# Patient Record
Sex: Male | Born: 1960
Health system: Southern US, Community
[De-identification: ages and names within clinical notes are randomized; demographics above are authoritative.]

## PROBLEM LIST (undated history)

## (undated) DIAGNOSIS — R233 Spontaneous ecchymoses: Secondary | ICD-10-CM

## (undated) DIAGNOSIS — F419 Anxiety disorder, unspecified: Secondary | ICD-10-CM

## (undated) DIAGNOSIS — Z460 Encounter for fitting and adjustment of spectacles and contact lenses: Secondary | ICD-10-CM

## (undated) DIAGNOSIS — R0989 Other specified symptoms and signs involving the circulatory and respiratory systems: Secondary | ICD-10-CM

## (undated) DIAGNOSIS — I10 Essential (primary) hypertension: Secondary | ICD-10-CM

## (undated) DIAGNOSIS — M1990 Unspecified osteoarthritis, unspecified site: Secondary | ICD-10-CM

## (undated) DIAGNOSIS — R238 Other skin changes: Secondary | ICD-10-CM

## (undated) DIAGNOSIS — Z9989 Dependence on other enabling machines and devices: Secondary | ICD-10-CM

## (undated) DIAGNOSIS — E669 Obesity, unspecified: Secondary | ICD-10-CM

## (undated) DIAGNOSIS — K8020 Calculus of gallbladder without cholecystitis without obstruction: Secondary | ICD-10-CM

## (undated) DIAGNOSIS — Z923 Personal history of irradiation: Secondary | ICD-10-CM

## (undated) DIAGNOSIS — N200 Calculus of kidney: Secondary | ICD-10-CM

## (undated) DIAGNOSIS — R5383 Other fatigue: Secondary | ICD-10-CM

## (undated) DIAGNOSIS — F32A Depression, unspecified: Secondary | ICD-10-CM

## (undated) DIAGNOSIS — F329 Major depressive disorder, single episode, unspecified: Secondary | ICD-10-CM

## (undated) DIAGNOSIS — R519 Headache, unspecified: Secondary | ICD-10-CM

## (undated) DIAGNOSIS — E785 Hyperlipidemia, unspecified: Secondary | ICD-10-CM

## (undated) DIAGNOSIS — F319 Bipolar disorder, unspecified: Secondary | ICD-10-CM

## (undated) DIAGNOSIS — G4733 Obstructive sleep apnea (adult) (pediatric): Secondary | ICD-10-CM

## (undated) DIAGNOSIS — C61 Malignant neoplasm of prostate: Secondary | ICD-10-CM

## (undated) DIAGNOSIS — R51 Headache: Secondary | ICD-10-CM

## (undated) HISTORY — DX: Essential (primary) hypertension: I10

## (undated) HISTORY — DX: Dependence on other enabling machines and devices: Z99.89

## (undated) HISTORY — DX: Headache, unspecified: R51.9

## (undated) HISTORY — DX: Anxiety disorder, unspecified: F41.9

## (undated) HISTORY — DX: Spontaneous ecchymoses: R23.3

## (undated) HISTORY — DX: Depression, unspecified: F32.A

## (undated) HISTORY — DX: Other fatigue: R53.83

## (undated) HISTORY — DX: Headache: R51

## (undated) HISTORY — DX: Obstructive sleep apnea (adult) (pediatric): G47.33

## (undated) HISTORY — DX: Other specified symptoms and signs involving the circulatory and respiratory systems: R09.89

## (undated) HISTORY — DX: Encounter for fitting and adjustment of spectacles and contact lenses: Z46.0

## (undated) HISTORY — DX: Major depressive disorder, single episode, unspecified: F32.9

## (undated) HISTORY — DX: Other skin changes: R23.8

## (undated) HISTORY — DX: Calculus of kidney: N20.0

---

## 2007-01-03 ENCOUNTER — Inpatient Hospital Stay (HOSPITAL_COMMUNITY): Admission: EM | Admit: 2007-01-03 | Discharge: 2007-01-06 | Payer: Self-pay | Admitting: Emergency Medicine

## 2007-09-21 ENCOUNTER — Encounter: Admission: RE | Admit: 2007-09-21 | Discharge: 2007-09-21 | Payer: Self-pay | Admitting: Orthopedic Surgery

## 2009-04-28 ENCOUNTER — Ambulatory Visit (HOSPITAL_COMMUNITY): Admission: RE | Admit: 2009-04-28 | Discharge: 2009-04-28 | Payer: Self-pay | Admitting: Surgery

## 2009-04-30 ENCOUNTER — Encounter: Admission: RE | Admit: 2009-04-30 | Discharge: 2009-04-30 | Payer: Self-pay | Admitting: Surgery

## 2009-05-05 ENCOUNTER — Encounter (INDEPENDENT_AMBULATORY_CARE_PROVIDER_SITE_OTHER): Payer: Self-pay | Admitting: Surgery

## 2009-05-05 ENCOUNTER — Ambulatory Visit (HOSPITAL_COMMUNITY): Admission: RE | Admit: 2009-05-05 | Discharge: 2009-05-05 | Payer: Self-pay | Admitting: Surgery

## 2009-05-16 ENCOUNTER — Ambulatory Visit: Payer: Self-pay | Admitting: Vascular Surgery

## 2009-08-21 ENCOUNTER — Encounter: Admission: RE | Admit: 2009-08-21 | Discharge: 2009-09-03 | Payer: Self-pay | Admitting: Surgery

## 2009-08-25 ENCOUNTER — Encounter: Admission: RE | Admit: 2009-08-25 | Discharge: 2009-08-25 | Payer: Self-pay | Admitting: Surgery

## 2009-09-09 ENCOUNTER — Ambulatory Visit (HOSPITAL_COMMUNITY): Admission: RE | Admit: 2009-09-09 | Discharge: 2009-09-11 | Payer: Self-pay | Admitting: Surgery

## 2009-09-09 HISTORY — PX: LAPAROSCOPIC GASTRIC BANDING: SHX1100

## 2009-09-25 ENCOUNTER — Encounter: Admission: RE | Admit: 2009-09-25 | Discharge: 2009-12-24 | Payer: Self-pay | Admitting: Surgery

## 2009-12-06 ENCOUNTER — Emergency Department (HOSPITAL_COMMUNITY): Admission: EM | Admit: 2009-12-06 | Discharge: 2009-12-06 | Payer: Self-pay | Admitting: Emergency Medicine

## 2009-12-24 ENCOUNTER — Encounter: Admission: RE | Admit: 2009-12-24 | Discharge: 2009-12-24 | Payer: Self-pay | Admitting: Occupational Medicine

## 2010-09-28 ENCOUNTER — Encounter: Payer: Self-pay | Admitting: Surgery

## 2010-11-22 LAB — GLUCOSE, CAPILLARY
Glucose-Capillary: 107 mg/dL — ABNORMAL HIGH (ref 70–99)
Glucose-Capillary: 111 mg/dL — ABNORMAL HIGH (ref 70–99)
Glucose-Capillary: 112 mg/dL — ABNORMAL HIGH (ref 70–99)
Glucose-Capillary: 131 mg/dL — ABNORMAL HIGH (ref 70–99)
Glucose-Capillary: 146 mg/dL — ABNORMAL HIGH (ref 70–99)
Glucose-Capillary: 189 mg/dL — ABNORMAL HIGH (ref 70–99)
Glucose-Capillary: 75 mg/dL (ref 70–99)
Glucose-Capillary: 92 mg/dL (ref 70–99)
Glucose-Capillary: 94 mg/dL (ref 70–99)
Glucose-Capillary: 94 mg/dL (ref 70–99)
Glucose-Capillary: 98 mg/dL (ref 70–99)

## 2010-11-22 LAB — BLOOD GAS, ARTERIAL
Acid-base deficit: 10.9 mmol/L — ABNORMAL HIGH (ref 0.0–2.0)
O2 Content: 15 L/min
Patient temperature: 98.6
pCO2 arterial: 58.4 mmHg (ref 35.0–45.0)
pO2, Arterial: 70.4 mmHg — ABNORMAL LOW (ref 80.0–100.0)

## 2010-11-22 LAB — BASIC METABOLIC PANEL
BUN: 82 mg/dL — ABNORMAL HIGH (ref 6–23)
BUN: 94 mg/dL — ABNORMAL HIGH (ref 6–23)
CO2: 19 mEq/L (ref 19–32)
CO2: 25 mEq/L (ref 19–32)
Calcium: 10.3 mg/dL (ref 8.4–10.5)
Calcium: 9 mg/dL (ref 8.4–10.5)
Calcium: 9.2 mg/dL (ref 8.4–10.5)
Calcium: 9.8 mg/dL (ref 8.4–10.5)
Chloride: 109 mEq/L (ref 96–112)
Creatinine, Ser: 2.56 mg/dL — ABNORMAL HIGH (ref 0.4–1.5)
Creatinine, Ser: 2.87 mg/dL — ABNORMAL HIGH (ref 0.4–1.5)
Creatinine, Ser: 3.9 mg/dL — ABNORMAL HIGH (ref 0.4–1.5)
Creatinine, Ser: 4.07 mg/dL — ABNORMAL HIGH (ref 0.4–1.5)
Creatinine, Ser: 4.38 mg/dL — ABNORMAL HIGH (ref 0.4–1.5)
GFR calc Af Amer: 19 mL/min — ABNORMAL LOW (ref >60)
GFR calc Af Amer: 28 mL/min — ABNORMAL LOW (ref >60)
GFR calc Af Amer: 32 mL/min — ABNORMAL LOW (ref >60)
GFR calc non Af Amer: 14 mL/min — ABNORMAL LOW (ref >60)
GFR calc non Af Amer: 17 mL/min — ABNORMAL LOW (ref >60)
GFR calc non Af Amer: 18 mL/min — ABNORMAL LOW (ref >60)
GFR calc non Af Amer: 24 mL/min — ABNORMAL LOW (ref >60)
GFR calc non Af Amer: 27 mL/min — ABNORMAL LOW (ref >60)
Glucose, Bld: 101 mg/dL — ABNORMAL HIGH (ref 70–99)
Potassium: 4.7 mEq/L (ref 3.5–5.1)
Potassium: 6.2 mEq/L — ABNORMAL HIGH (ref 3.5–5.1)
Sodium: 135 mEq/L (ref 135–145)
Sodium: 140 mEq/L (ref 135–145)
Sodium: 142 mEq/L (ref 135–145)

## 2010-11-22 LAB — URINALYSIS, ROUTINE W REFLEX MICROSCOPIC
Glucose, UA: NEGATIVE mg/dL
Ketones, ur: NEGATIVE mg/dL
Protein, ur: NEGATIVE mg/dL
pH: 5 (ref 5.0–8.0)

## 2010-11-22 LAB — POCT I-STAT EG7: HCT: 29 % — ABNORMAL LOW (ref 39.0–52.0)

## 2010-11-22 LAB — DIFFERENTIAL
Basophils Absolute: 0 10*3/uL (ref 0.0–0.1)
Eosinophils Relative: 1 % (ref 0–5)
Lymphocytes Relative: 8 % — ABNORMAL LOW (ref 12–46)
Monocytes Absolute: 0.7 10*3/uL (ref 0.1–1.0)
Monocytes Relative: 6 % (ref 3–12)

## 2010-11-22 LAB — CBC
HCT: 29.5 % — ABNORMAL LOW (ref 39.0–52.0)
Hemoglobin: 10.4 g/dL — ABNORMAL LOW (ref 13.0–17.0)
Hemoglobin: 9.9 g/dL — ABNORMAL LOW (ref 13.0–17.0)
RBC: 3.48 MIL/uL — ABNORMAL LOW (ref 4.22–5.81)
RDW: 13.9 % (ref 11.5–15.5)

## 2010-11-22 LAB — LITHIUM LEVEL: Lithium Lvl: 2 mEq/L — ABNORMAL HIGH (ref 0.80–1.40)

## 2010-11-22 LAB — URINE MICROSCOPIC-ADD ON

## 2010-11-22 LAB — POCT I-STAT, CHEM 8
Chloride: 121 mEq/L — ABNORMAL HIGH (ref 96–112)
Creatinine, Ser: 4.1 mg/dL — ABNORMAL HIGH (ref 0.4–1.5)
Sodium: 131 mEq/L — ABNORMAL LOW (ref 135–145)

## 2010-11-22 LAB — HEMOGLOBIN A1C: Mean Plasma Glucose: 126 mg/dL

## 2010-11-22 LAB — CREATININE, URINE, RANDOM: Creatinine, Urine: 69.7 mg/dL

## 2010-11-22 LAB — UREA NITROGEN, URINE: Urea Nitrogen, Ur: 585 mg/dL

## 2010-12-07 LAB — DIFFERENTIAL
Lymphs Abs: 1.3 10*3/uL (ref 0.7–4.0)
Monocytes Relative: 6 % (ref 3–12)
Neutro Abs: 12.2 10*3/uL — ABNORMAL HIGH (ref 1.7–7.7)
Neutrophils Relative %: 83 % — ABNORMAL HIGH (ref 43–77)

## 2010-12-07 LAB — CBC
HCT: 31.8 % — ABNORMAL LOW (ref 39.0–52.0)
Hemoglobin: 10.5 g/dL — ABNORMAL LOW (ref 13.0–17.0)
MCHC: 33 g/dL (ref 30.0–36.0)
MCV: 90.8 fL (ref 78.0–100.0)
RDW: 14 % (ref 11.5–15.5)

## 2010-12-07 LAB — COMPREHENSIVE METABOLIC PANEL
BUN: 62 mg/dL — ABNORMAL HIGH (ref 6–23)
CO2: 21 mEq/L (ref 19–32)
Calcium: 9.7 mg/dL (ref 8.4–10.5)
Creatinine, Ser: 3.93 mg/dL — ABNORMAL HIGH (ref 0.4–1.5)
GFR calc non Af Amer: 16 mL/min — ABNORMAL LOW (ref >60)
Glucose, Bld: 103 mg/dL — ABNORMAL HIGH (ref 70–99)
Total Protein: 7.9 g/dL (ref 6.0–8.3)

## 2011-01-22 NOTE — Consult Note (Signed)
NAMEOBIE, KALLENBACH NO.:  [id number]   MEDICAL RECORD NO.:  [id number]          PATIENT TYPE:  INP   LOCATION:  5007                         FACILITY:  [facility]   PHYSICIAN:  Antonietta Breach, M.D.  DATE OF BIRTH:  June 13, 1961   DATE OF CONSULTATION:  01/04/2007  DATE OF DISCHARGE:  01/06/2007                                 CONSULTATION   REQUESTING PHYSICIAN:  Kela Millin, M.D.   REASON FOR CONSULTATION:  Confusion, psychosis, with lithium toxicity.  Evaluate and recommend psychotropic medication management.   HISTORY OF PRESENT ILLNESS:  Frank Cox is a 50 year old male  admitted to the [facility] on January 03, 2007, due to acute confusion,  persistent nausea and vomiting.  The patient has about a week of  progressive nausea, vomiting, staggering, as well as confusion and  thought disorganization.   When the patient's lithium level was assessed in the emergency room, it  was at 3.02.   Frank Cox has recovered his orientation.  He now has intact judgment and  thought process.  He is cooperative with health care.  His mood is  within normal limits.  He has intact interests and future goals.   PAST PSYCHIATRIC HISTORY:  Frank Cox has a history of mania which  developed in his 52s.  He has experienced an extended several-day period  of racing thoughts, pressured speech, increased goal-directed activity,  euphoria, decreased need for sleep and heightened energy.  He also  experienced visual hallucinations during his manic period.   He has been maintained on lithium for 20 years.   Concerning depression, the patient has had severe depression in the past  which resulted in a suicide attempt in 2000 with an overdose of  medication.  He had a psychiatric admission at that time, where Lamictal  was added to lithium in order to stabilize him.   He does have a history of trying other mood stabilizers including  Depakote and Tegretol, which have  failed.   FAMILY PSYCHIATRIC HISTORY:  None known.   SOCIAL HISTORY:  No alcohol.  No illegal drugs.  He does not smoke.  He  works for the [employer].   PAST MEDICAL HISTORY:  Frank Cox does have hypertension and has been  treated with hydrochlorothiazide.  He also has a history of seizure  disorder.   MEDICATIONS:  The MAR is reviewed.  He is on Lamictal 200 mg daily,  Ativan 1 mg q.h.s., Ativan 1 mg b.i.d. p.r.n.   No known drug allergies.   LABORATORY DATA:  WBC 18.3, hemoglobin 11.8, platelet count 375.  BUN  13, creatinine 1.05, calcium 9.9.  Lithium was 2.37 on April 29, now  decreased to 1.61 today on April 30.   REVIEW OF SYSTEMS:  CONSTITUTIONAL:  Afebrile.  No weight loss.  HEAD:  No trauma.  EYES:  No visual changes.  EARS:  No hearing impairment.  NOSE:  No rhinorrhea.  MOUTH/THROAT:  No sore throat.  NEUROLOGIC:  As  above.  PSYCHIATRIC:  The patient's medications on admission included  Celexa 40 mg  daily, Klonopin 1 mg daily, Lamictal 200 mg daily, and  lithium 450 mg daily.  CARDIOVASCULAR:  No chest pain or palpitations.  RESPIRATORY:  No coughing or wheezing.  GASTROINTESTINAL:  No nausea,  vomiting, diarrhea.  GENITOURINARY:  No dysuria.  SKIN:  Unremarkable.  MUSCULOSKELETAL:  No deformities.  ENDOCRINE/METABOLIC:  Unremarkable.  HEMATOLOGIC/LYMPHATIC:  Mild anemia.   EXAMINATION:  VITAL SIGNS:  Temperature 99.8, pulse 82, respiration 22,  blood pressure 161/78, O2 saturation on room air 97%.  GENERAL APPEARANCE:  Frank Cox is a middle-aged male reclining in his  hospital bed in a supine position, with no abnormal involuntary  movements.  His body habitus is normal.  He is well-groomed.  OTHER MENTAL STATUS EXAM:  Frank Cox is alert.  His eye contact is good.  His attention span is normal.  His concentration is mildly decreased.  He is oriented completely to all spheres.  His memory is intact to  immediate, recent and remote except for the severe  delirium periods  during his high lithium toxicity.   Speech involves normal rate and prosody without dysarthria.  Fund of  knowledge and intelligence are within normal limits.  Thought process  logical, coherent, goal-directed.  No looseness of associations.  Calculation and abstraction ability are intact.  Thought content:  No  thoughts of harming himself.  No thoughts of harming others.  No  delusions.  No hallucinations.  Insight is partial.  Judgment is intact.  Affect is mildly anxious at baseline but with a broad, appropriate  response.  Mood is within normal limits.   ASSESSMENT:  AXIS I:  293.00 delirium due to lithium toxicity, now  improved.  296.80, bipolar disorder not otherwise specified, stable.  AXIS II:  None.  AXIS III:  See general medical problems.  AXIS IV:  General medical, primary support group.  AXIS V:  55.   Frank Cox is not at risk to harm himself or others.  He does agree to  call emergency services immediately for any thoughts of harming himself,  thoughts of harming others, or other psychiatric emergency symptoms.   The undersigned provided education and ego-supportive psychotherapy.   The indications, alternatives and adverse effects of the following were  discussed with the patient:  Lithium, including the risk of lethal  toxicity along with the risk of renal damage and thyroid damage;  Lamictal to provide mood stabilization in addition to lithium due to the  patient not responding to single mood stabilizer therapy, including the  risk of a lethal rash from Lamictal; Celexa for antidepression including  the risk of mania; Klonopin for antianxiety but also augmenting lithium  and Lamictal in mood stabilization.  The patient understands the above and wants to proceed with the plan  below.   1. Would continue the patient's preadmission psychotropic medications      and dosages including his lithium 450 mg daily, Lamictal 200 mg      daily, Celexa 40  mg daily, as well as Klonopin 1 mg daily.      Concerning his outpatient follow-up, would have him follow up with      his outpatient psychiatrist within the first week of discharge.  2. Regarding the patient's polypharmacy above, it must be understood      that he has had a history of refractory mood states on more simple      regimen and has attained this regimen over several medication      trials and has developed  stability with this regimen.  3. Given the risks of managing lithium with hydrochlorothiazide, would      try to avoid hydrochlorothiazide and use an alternative      antihypertensive treatment.  4. Would restart the patient's lithium if the hydrochlorothiazide is      not used.  Would start the lithium at 450 mg q.h.s., which is a      conservative dose.  Would then take a level of lithium at 12 hours      post last dose (trough level) on day #6.  Would adjust his lithium      accordingly.   DISCUSSION:  With the patient's change in diet and/or change in HCTZ  dosing, the lithium blood level increased resulting in lithium toxicity.      Antonietta Breach, M.D.  Electronically Signed     JW/MEDQ  D:  03/16/2007  T:  03/17/2007  Job:  [id number]

## 2011-01-22 NOTE — Discharge Summary (Signed)
Frank Cox, Frank Cox                ACCOUNT NO.:  [id number]   MEDICAL RECORD NO.:  [id number]          PATIENT TYPE:  INP   LOCATION:  5007                         FACILITY:  [facility]   PHYSICIAN:  Michelene Gardener, MD    DATE OF BIRTH:  Mar 19, 1961   DATE OF ADMISSION:  01/03/2007  DATE OF DISCHARGE:  01/06/2007                               DISCHARGE SUMMARY   PRIMARY CARE PHYSICIAN:  Dr. Tally Joe.   DISCHARGE DIAGNOSES:  1. Lithium toxicity.  2. Leukocytosis secondary to urinary tract infection.  3. Dehydration.  4. Hypertension.  5. Bipolar disorder.  6. History of seizure disorder.  7. Morbid obesity.   DISCHARGE MEDICATIONS:  1. Ibuprofen as needed.  2. Diclofenac 75 mg p.o. twice daily as needed.  3. Lisinopril 20 mg p.o. once daily.  4. Metoprolol 25 mg p.o. twice daily.  5. Lithium 450 mg p.o. at bedtime.  6. Lamictal 200 mg p.o. at night.  7. Clonazepam 1 mg p.o. three times daily.  8. Citalopram 40 mg p.o. once daily.  9. Benadryl as needed.   CONSULTATIONS:  Psychiatry consult.   PROCEDURES:  None.   FOLLOW-UP APPOINTMENTS:  1. With Dr. Tally Joe in one week.  2. Private psychiatrist, Dr. Ander Slade Pret, in six days.   CONDITION AT TIME OF DISCHARGE:  The patient is stable.   MEDICATIONS THAT WERE DISCONTINUED:  Lisinopril/hydrochlorothiazide that  was switched to lisinopril.   COURSE OF HOSPITALIZATION:  This 51 year old morbidly obese gentleman  was admitted on April29,2008 with lithium toxicity.  At the time  of presentation his lithium was 3.3.  This patient has been having  bipolar disorder, and he was taking lithium for more than 20 years.  He  has been doing fine on lithium.  Presented with nausea and vomiting  which was secondary to lithium.  The patient was admitted initially to  the step-down unit.  His lithium was discontinued, and he was hydrated  aggressively.  Repeat of his lithium showed improvement done on the time  of discharge.  It came  down to 1.1.  During hospitalization he was seen  by Dr. Jeanie Sewer, psychiatrist, who recommended to restart him back on  his lithium at a lower dose at 450 mg a day, and then he should check  his lithium level in six days with his private psychiatrist, and his  dose can be adjusted accordingly.  The patient's urine was also showing  possible urinary tract infection.  On the time of presentation the  patient had urine culture which came to be normal.  The patient was on  lisinopril/hydrochlorothiazide, and that was discontinued as recommended  by psychiatrist since the hydrochlorothiazide will increase the lithium  level.  The patient was kept on lisinopril in addition to metoprolol to  control his blood pressure.  He was again advised to see his primary  physician for further adjustment of his antihypertensive medications.   DISPOSITION:  Otherwise, other medical problems remained stable, and the  patient was continued on same medications taken at home.   Total assessment time is 40 minutes.  Michelene Gardener, MD  Electronically Signed     NAE/MEDQ  D:  01/06/2007  T:  01/06/2007  Job:  [id number]   cc:   Tally Joe, M.D.  Enis Slipper, M.D.

## 2011-01-22 NOTE — H&P (Signed)
NAMEYISRAEL, OBRYAN                ACCOUNT NO.:  [id number]   MEDICAL RECORD NO.:  [id number]          PATIENT TYPE:  INP   LOCATION:  3305                         FACILITY:  [facility]   PHYSICIAN:  Kela Millin, M.D.DATE OF BIRTH:  07-Jul-1961   DATE OF ADMISSION:  01/03/2007  DATE OF DISCHARGE:                              HISTORY & PHYSICAL   PRIMARY CARE PHYSICIAN:  Tally Joe, M.D.   CHIEF COMPLAINT:  Persistent nausea and vomiting.   HISTORY OF PRESENT ILLNESS:  The patient is a 50 year old, morbidly  obese, white male with past medical history significant for bipolar  disorder on Lithium x20 years, hypertension, seizure disorder with last  seizure in 2001, who presents with complaints of nausea and vomiting x1  week.  His parents are present at the bedside assisting with the  history.  The patient states that he was scheduled for a gastric lap  banding procedure at [facility] per Dr. Lily Peer today and in preparation  for that surgery, he has been on a liquid/protein diet regimen for a  couple of weeks.  For the past 1 week now, he has had nausea, vomiting  and just generalized malaise, but he states that he thought that he was  going to feel bad anyway on the diet, so he did not get too concerned  about it.  His parents state that they just came from out of town to see  the patient and help him out around the time of his surgery and they  have noticed that he has been confused sometimes and really unlike  himself, disorganized and does not seem to be able to think clearly.  They also state that they have noted some intermittent tremors and also  that he seems to stagger when walking.  The patient does not really know  whether or not he took his medications last p.m. or when exactly he took  his medications.  He states that his psychiatrist is Dr. Bobby Rumpf, but he  does not recall when his last Lithium level was done.  He denies fevers,  chest pain, cough, dysuria and no  seizures were reported.  As above, he  states his last seizure was in 2001.   The patient was seen in the emergency room and a Lithium level was done  and this was found to be elevated at 3.02.  His urinalysis showed trace  leukocyte esterase with 3-6 wbc's and rare bacteria.  He is admitted to  the [facility] service for further evaluation and management.   PAST MEDICAL HISTORY:  As above.   MEDICATIONS:  1. Ibuprofen.  2. Diclofenac 75 mg b.i.d.  3. Lamictal 200 mg  p.o. nightly.  4. Lisinopril/hydrochlorothiazide 20/25 mg daily.  5. Lithium 450 mg four tablets nightly.  6. Metoprolol 25 mg daily.  7. Clonazepam 1 mg t.i.d.  8. Citalopram 40 mg daily.  9. Benadryl p.r.n.   ALLERGIES:  NO KNOWN DRUG ALLERGIES.   SOCIAL HISTORY:  He denies tobacco and alcohol.   FAMILY HISTORY:  Noncontributory.   REVIEW OF SYSTEMS:  As per HPI.  PHYSICAL EXAMINATION:  GENERAL:  The patient is a morbidly obese, middle-  aged, white male.  He is alert and oriented x3 in no apparent distress.  VITAL SIGNS:  Temperature 97.8 with a blood pressure of 117/59, pulse  68, respirations 20, O2 saturations 95%.  HEENT:  PERRL, EOMI, sclerae anicteric.  Dry mucous membranes and no  oral exudates.  NECK:  Supple, no adenopathy and no thyromegaly.  LUNGS:  Decreased breath sounds at the bases, otherwise clear to  auscultation bilaterally, no wheezes.  CARDIOVASCULAR:  Regular rate and rhythm, normal S1, S2, no S3  appreciated.  ABDOMEN:  Obese, bowel sounds present, nontender, nondistended.  No  organomegaly and no masses appreciated.  EXTREMITIES:  Brownish discoloration in his lower legs.  No edema or  cyanosis.  NEUROLOGIC:  He is alert and oriented x3.  Her appears to have  difficulty recalling/following through with his train of thought.  Sensory is grossly intact.  Strength is 5/5 and symmetric.  Reflexes +2  and symmetric.  No clonus.   LABORATORY DATA AND X-RAY FINDINGS:  Sodium  136 with a potassium of 4.3,  chloride 110, BUN 35, glucose 147.  His pH is 7.46 with pCO2 of 28.9,  creatinine 1.3.  Lithium level is 3.02.  Hemoglobin 12.9 with a  hematocrit of 38.  Urinalysis is clear with specific gravity of 1.018,  trace leukocyte esterase, 3-6 wbc's, rare bacteria.   ASSESSMENT/PLAN:  1. Lithium intoxication as discussed above, more likely secondary to      chronic accumulation.  The patient with multiple factors that      likely to impair Lithium excretion (nonsteroidal anti-inflammatory      drugs use with ibuprofen and Naproxen), diuretics      (hydrochlorothiazide), ACE inhibitor, volume depletion and probable      urinary tract infection.  Will discontinue Lithium for now.      Supportive care with adequate hydration and intravenous fluids with      goal urine output of 1-2 mL/kg per hour.  Will obtain an EKG and      monitor urine output and mental status in the step-down unit.  Will      discontinue ACE inhibitor and nonsteroidal anti-inflammatory drugs      for now.  The patient was discussed with poison control and they      agree with above and also recommend a recheck Lithium level in 4      hours, no indications for dialysis at this time.  Will consult      psychiatry for assistance with medication recommendations.  2. Pyuria, probable urinary tract infection.  Will obtain urine      cultures, empiric antibiotics and follow.  3. Volume depletion/azotemia.  Hydrate as above and follow.  4. Hypertension.  Monitor blood pressures and treat as appropriate.  5. Bipolar disorder.  As above, psychiatric consult to assist with      medications.  6. History of seizure disorder.  Continue Lamictal as above.  Last      seizure was in 2001.  Monitor in step-down.  7. Morbid obesity.  Lap banding procedure per Dr. Lily Peer, canceled      for now.  Follow up with primary care physician upon discharge.     Kela Millin, M.D.  Electronically Signed     ACV/MEDQ  D:  01/04/2007  T:  01/04/2007  Job:  [id number]   cc:   Tally Joe, M.D.  Dr. [facility]

## 2011-03-05 ENCOUNTER — Ambulatory Visit (INDEPENDENT_AMBULATORY_CARE_PROVIDER_SITE_OTHER): Payer: Self-pay | Admitting: Physician Assistant

## 2011-03-05 ENCOUNTER — Encounter (INDEPENDENT_AMBULATORY_CARE_PROVIDER_SITE_OTHER): Payer: Self-pay | Admitting: Physician Assistant

## 2011-03-05 VITALS — BP 122/78 | Ht 70.0 in | Wt 320.0 lb

## 2011-03-05 DIAGNOSIS — Z4651 Encounter for fitting and adjustment of gastric lap band: Secondary | ICD-10-CM

## 2011-03-05 NOTE — Patient Instructions (Signed)
Take clear liquids for the next 48 hours. Thin protein shakes are ok to start on Saturday evening. Call us if you have persistent vomiting or regurgitation, night cough or reflux symptoms. Return as scheduled or sooner if you notice no changes in hunger/portion sizes.   

## 2011-03-05 NOTE — Progress Notes (Signed)
Subjective:     Patient ID: Frank Cox, male   DOB: 1960/09/29, 50 y.o.   MRN: [id number]    BP 122/78  Ht 5\' 10"  (1.778 m)  Wt 320 lb (145.151 kg)  BMI 45.92 kg/m2    HPI See scanned dictated note.  Review of Systems     Objective:   Physical Exam     Assessment:         Plan:

## 2011-04-09 ENCOUNTER — Ambulatory Visit (INDEPENDENT_AMBULATORY_CARE_PROVIDER_SITE_OTHER): Payer: 59 | Admitting: Physician Assistant

## 2011-04-09 ENCOUNTER — Encounter (INDEPENDENT_AMBULATORY_CARE_PROVIDER_SITE_OTHER): Payer: Self-pay

## 2011-04-09 NOTE — Patient Instructions (Signed)
Return in 1 month or sooner if necessary.

## 2011-04-09 NOTE — Progress Notes (Signed)
  HISTORY: Frank Cox is a 50 y.o.male who received an AP-Large lap-band in January 2011 by Dr. Daphine Deutscher. He has been doing well since his last visit. He denies vomiting or regurgitation symptoms and says his hunger and portion sizes remain under control.  VITAL SIGNS: Filed Vitals:   04/09/11 1625  BP: 122/72    PHYSICAL EXAM: Physical exam reveals a very well-appearing 50 y.o.male in no apparent distress Neurologic: Awake, alert, oriented Psych: Bright affect, conversant Respiratory: Breathing even and unlabored. No stridor or wheezing Extremities: Atraumatic, good range of motion. Skin: Warm, Dry, no rashes Musculoskeletal: Normal gait, Joints normal  ASSESMENT: 50 y.o.  male  s/p AP-Large lap-band.   PLAN: We deferred an adjustment today. We'll have him back in 1 month or sooner if necessary.

## 2011-05-14 ENCOUNTER — Encounter (INDEPENDENT_AMBULATORY_CARE_PROVIDER_SITE_OTHER): Payer: Self-pay

## 2011-05-14 ENCOUNTER — Ambulatory Visit (INDEPENDENT_AMBULATORY_CARE_PROVIDER_SITE_OTHER): Payer: 59 | Admitting: Physician Assistant

## 2011-05-14 NOTE — Patient Instructions (Signed)
Return in 4-6 weeks or sooner if necessary.

## 2011-05-14 NOTE — Progress Notes (Signed)
  HISTORY: Frank Cox is a 50 y.o.male who received an AP-Large lap-band in January 2011 by Dr. Daphine Deutscher. He says his hunger and portion sizes are in good control. No vomiting or regurgitation. He's lost four pounds since his last visit.Marland Kitchen  VITAL SIGNS: Filed Vitals:   05/14/11 1550  BP: 128/86  Pulse: 60    PHYSICAL EXAM: Physical exam reveals a very well-appearing 50 y.o.male in no apparent distress Neurologic: Awake, alert, oriented Psych: Bright affect, conversant Respiratory: Breathing even and unlabored. No stridor or wheezing Extremities: Atraumatic, good range of motion. Skin: Warm, Dry, no rashes Musculoskeletal: Normal gait, Joints normal  ASSESMENT: 50 y.o.  male  s/p AP-Large lap-band.   PLAN: We deferred an adjustment today on account of his being in the green zone. We'll have him back in 4-6 weeks.

## 2011-06-25 ENCOUNTER — Encounter (INDEPENDENT_AMBULATORY_CARE_PROVIDER_SITE_OTHER): Payer: 59

## 2011-07-21 ENCOUNTER — Encounter (INDEPENDENT_AMBULATORY_CARE_PROVIDER_SITE_OTHER): Payer: Self-pay | Admitting: Surgery

## 2011-07-23 ENCOUNTER — Encounter (INDEPENDENT_AMBULATORY_CARE_PROVIDER_SITE_OTHER): Payer: 59

## 2011-11-24 ENCOUNTER — Encounter (INDEPENDENT_AMBULATORY_CARE_PROVIDER_SITE_OTHER): Payer: Self-pay | Admitting: Surgery

## 2011-11-25 ENCOUNTER — Encounter (INDEPENDENT_AMBULATORY_CARE_PROVIDER_SITE_OTHER): Payer: Self-pay

## 2011-11-25 ENCOUNTER — Ambulatory Visit (INDEPENDENT_AMBULATORY_CARE_PROVIDER_SITE_OTHER): Payer: 59 | Admitting: Physician Assistant

## 2011-11-25 VITALS — BP 132/84 | HR 72 | Temp 97.8°F | Resp 18 | Ht 70.0 in | Wt 330.0 lb

## 2011-11-25 DIAGNOSIS — Z4651 Encounter for fitting and adjustment of gastric lap band: Secondary | ICD-10-CM

## 2011-11-25 NOTE — Patient Instructions (Signed)
Take clear liquids tonight. Thin protein shakes are ok to start tomorrow morning. Slowly advance your diet thereafter. Call us if you have persistent vomiting or regurgitation, night cough or reflux symptoms. Return as scheduled or sooner if you notice no changes in hunger/portion sizes.  

## 2011-11-25 NOTE — Progress Notes (Signed)
  HISTORY: Frank Cox is a 51 y.o.male who received an AP-Large lap-band in January 2011 by Dr. Daphine Deutscher. He comes in with increased hunger and portion sizes. He also is having difficulty with physical activity as his job has become sedentary and he's working third shift. He reports no untwoard symptoms.  VITAL SIGNS: Filed Vitals:   11/25/11 1156  BP: 132/84  Pulse: 72  Temp: 97.8 F (36.6 C)  Resp: 18    PHYSICAL EXAM: Physical exam reveals a very well-appearing 51 y.o.male in no apparent distress Neurologic: Awake, alert, oriented Psych: Bright affect, conversant Respiratory: Breathing even and unlabored. No stridor or wheezing Abdomen: Soft, nontender, nondistended to palpation. Incisions well-healed. No incisional hernias. Port easily palpated. Extremities: Atraumatic, good range of motion.  ASSESMENT: 51 y.o.  male  s/p AP-large lap-band.   PLAN: The patient's port was accessed with a 20G Huber needle without difficulty. Clear fluid was aspirated and 0.5 mL saline was added to the port. The patient was able to swallow water without difficulty following the procedure and was instructed to take clear liquids for the next 24-48 hours and advance slowly as tolerated.

## 2011-12-02 ENCOUNTER — Ambulatory Visit (INDEPENDENT_AMBULATORY_CARE_PROVIDER_SITE_OTHER): Payer: 59 | Admitting: Surgery

## 2011-12-02 ENCOUNTER — Encounter (INDEPENDENT_AMBULATORY_CARE_PROVIDER_SITE_OTHER): Payer: Self-pay | Admitting: Surgery

## 2011-12-02 VITALS — BP 112/78 | HR 76 | Temp 98.0°F | Ht 70.0 in | Wt 323.2 lb

## 2011-12-02 DIAGNOSIS — Z9884 Bariatric surgery status: Secondary | ICD-10-CM | POA: Insufficient documentation

## 2011-12-02 NOTE — Progress Notes (Signed)
Frank Cox Body mass index is 46.37 kg/(m^2).  Having regurgitation:  Yes. Cannot keep anything down  Nocturnal reflux?  yes  Amount of fill  -0.7 cc  Return 6 weeks

## 2012-01-07 ENCOUNTER — Encounter (INDEPENDENT_AMBULATORY_CARE_PROVIDER_SITE_OTHER): Payer: 59 | Admitting: Surgery

## 2012-02-08 ENCOUNTER — Encounter (INDEPENDENT_AMBULATORY_CARE_PROVIDER_SITE_OTHER): Payer: Self-pay | Admitting: Surgery

## 2012-02-10 ENCOUNTER — Encounter (INDEPENDENT_AMBULATORY_CARE_PROVIDER_SITE_OTHER): Payer: 59

## 2012-03-16 ENCOUNTER — Encounter (INDEPENDENT_AMBULATORY_CARE_PROVIDER_SITE_OTHER): Payer: 59

## 2012-04-27 ENCOUNTER — Ambulatory Visit (INDEPENDENT_AMBULATORY_CARE_PROVIDER_SITE_OTHER): Payer: 59 | Admitting: Physician Assistant

## 2012-04-27 ENCOUNTER — Encounter (INDEPENDENT_AMBULATORY_CARE_PROVIDER_SITE_OTHER): Payer: Self-pay

## 2012-04-27 VITALS — BP 160/80 | HR 45 | Temp 98.3°F | Resp 18 | Ht 71.0 in | Wt 334.0 lb

## 2012-04-27 DIAGNOSIS — Z4651 Encounter for fitting and adjustment of gastric lap band: Secondary | ICD-10-CM

## 2012-04-27 NOTE — Patient Instructions (Signed)
Attend radiology for upper GI. Return in one month or sooner if needed, especially if you have persistent vomiting.

## 2012-04-27 NOTE — Progress Notes (Signed)
  HISTORY: Frank Cox is a 51 y.o.male who received an AP-Large lap-band in January 2011 by Dr. Daphine Deutscher. He was last seen in March where Dr. Daphine Deutscher removed 0.7 mL from the band for persistent regurgitation following a fill. He said at that time he was intolerant of even liquids and had some blood-tinged emesis. He said despite the fluid removal he's had some solid food intolerance and has been reaching for high-carb foods. He's had continued occasional blood-tinged emesis as well. He complains of occasional LUQ fullness and pain with eating.  VITAL SIGNS: Filed Vitals:   04/27/12 0922  BP: 160/80  Pulse: 45  Temp: 98.3 F (36.8 C)  Resp: 18    PHYSICAL EXAM: Physical exam reveals a very well-appearing 51 y.o.male in no apparent distress Neurologic: Awake, alert, oriented Psych: Bright affect, conversant Respiratory: Breathing even and unlabored. No stridor or wheezing Abdomen: Soft, nontender, nondistended to palpation. Incisions well-healed. No incisional hernias. Port easily palpated. Extremities: Atraumatic, good range of motion.  ASSESMENT: 51 y.o.  male  s/p AP-Large lap-band.   PLAN: The patient's port was accessed with a 20G Huber needle without difficulty. Clear fluid was aspirated and 3 mL saline was removed from the band. I've scheduled him for an upper GI with follow-up afterwards. If he has continued blood-tinged emesis, he may be in need of an EGD. This was discussed with him and he agrees.

## 2012-05-09 ENCOUNTER — Ambulatory Visit
Admission: RE | Admit: 2012-05-09 | Discharge: 2012-05-09 | Disposition: A | Discharge status: Home / Self Care | Payer: Self-pay | Source: Ambulatory Visit | Attending: Physician Assistant | Admitting: Physician Assistant

## 2012-05-09 ENCOUNTER — Other Ambulatory Visit (INDEPENDENT_AMBULATORY_CARE_PROVIDER_SITE_OTHER): Payer: Self-pay | Admitting: Physician Assistant

## 2012-05-09 DIAGNOSIS — Z4651 Encounter for fitting and adjustment of gastric lap band: Secondary | ICD-10-CM

## 2012-05-10 ENCOUNTER — Other Ambulatory Visit (INDEPENDENT_AMBULATORY_CARE_PROVIDER_SITE_OTHER): Payer: Self-pay | Admitting: Physician Assistant

## 2012-05-10 ENCOUNTER — Ambulatory Visit
Admission: RE | Admit: 2012-05-10 | Discharge: 2012-05-10 | Disposition: A | Discharge status: Home / Self Care | Payer: 59 | Source: Ambulatory Visit | Attending: Physician Assistant | Admitting: Physician Assistant

## 2012-05-10 ENCOUNTER — Telehealth (INDEPENDENT_AMBULATORY_CARE_PROVIDER_SITE_OTHER): Payer: Self-pay | Admitting: General Surgery

## 2012-05-10 ENCOUNTER — Other Ambulatory Visit: Payer: Self-pay

## 2012-05-10 DIAGNOSIS — Z4651 Encounter for fitting and adjustment of gastric lap band: Secondary | ICD-10-CM

## 2012-05-10 NOTE — Telephone Encounter (Signed)
Spoke with pt and informed him that his UGI  Showed stable gastric band position w/ no obstruction.  I went ahead and made an appt for him to see Dr. Daphine Deutscher to see where to go from here.

## 2012-05-11 ENCOUNTER — Encounter (INDEPENDENT_AMBULATORY_CARE_PROVIDER_SITE_OTHER): Payer: 59

## 2012-05-15 ENCOUNTER — Encounter (INDEPENDENT_AMBULATORY_CARE_PROVIDER_SITE_OTHER): Payer: Self-pay | Admitting: Surgery

## 2012-05-15 ENCOUNTER — Ambulatory Visit (INDEPENDENT_AMBULATORY_CARE_PROVIDER_SITE_OTHER): Payer: 59 | Admitting: Surgery

## 2012-05-15 VITALS — BP 142/80 | HR 68 | Temp 97.7°F | Resp 16 | Ht 70.0 in | Wt 335.6 lb

## 2012-05-15 DIAGNOSIS — Z9884 Bariatric surgery status: Secondary | ICD-10-CM

## 2012-05-15 NOTE — Progress Notes (Signed)
Mr. Treinen was too tight when Mardelle Matte saw him August 22 in order an upper GI series. That showed his band to be in stable position with no obstruction. He had some decreased esophageal motility. He did have a fair amount of stool in his colon.  More importantly Mr. Iannone is doing so much better in terms of his food choices. He had developed maladaptive eating as a result of being too tight. He understands that now it is back eating the right things. I think that leaving more he has and see him back in 2 months.

## 2012-05-15 NOTE — Patient Instructions (Signed)

## 2012-05-25 ENCOUNTER — Encounter (INDEPENDENT_AMBULATORY_CARE_PROVIDER_SITE_OTHER): Payer: 59

## 2012-07-12 ENCOUNTER — Telehealth (INDEPENDENT_AMBULATORY_CARE_PROVIDER_SITE_OTHER): Payer: Self-pay | Admitting: General Surgery

## 2012-07-12 NOTE — Telephone Encounter (Signed)
LMOM letting Frank Cox know that we also have the choice for him to schedule with our [facility] tomorrow 07/13/12.  I asked the Frank Cox to return my call so I could reschedule him if he wishes.

## 2012-07-13 ENCOUNTER — Encounter (INDEPENDENT_AMBULATORY_CARE_PROVIDER_SITE_OTHER): Payer: Self-pay

## 2012-07-13 ENCOUNTER — Encounter (INDEPENDENT_AMBULATORY_CARE_PROVIDER_SITE_OTHER): Payer: 59 | Admitting: Surgery

## 2012-07-13 ENCOUNTER — Ambulatory Visit (INDEPENDENT_AMBULATORY_CARE_PROVIDER_SITE_OTHER): Payer: 59 | Admitting: Physician Assistant

## 2012-07-13 VITALS — BP 132/60 | HR 64 | Temp 97.2°F | Resp 20 | Ht 70.0 in | Wt 344.4 lb

## 2012-07-13 DIAGNOSIS — Z4651 Encounter for fitting and adjustment of gastric lap band: Secondary | ICD-10-CM

## 2012-07-13 NOTE — Progress Notes (Signed)
  HISTORY: Frank Cox is a 51 y.o.male who received an AP-Large lap-band in January 2011 by Dr. Daphine Deutscher. He was last seen at the end of August for regurgitation symptoms. I removed 3 mL and sent him for an upper GI which demonstrated an open band in good position but some esophageal dysmotility. He's been able to eat and drink without difficulty since then but his fear of recurrent dysphagia has prompted him to take mostly fluids. He's discussed this at length with his mental health professional who, he says, recommended consideration of band removal. That seems at the forefront of his mind at this point. He says he's terrified of recurrent dysphagia. He's asked that all fluid be removed from the band and that we set up a meeting with Dr. Daphine Deutscher for further discussion.  VITAL SIGNS: Filed Vitals:   07/13/12 0853  BP: 132/60  Pulse: 64  Temp: 97.2 F (36.2 C)  Resp: 20    PHYSICAL EXAM: Physical exam reveals a very well-appearing 51 y.o.male in no apparent distress Neurologic: Awake, alert, oriented Psych: Bright affect, conversant Respiratory: Breathing even and unlabored. No stridor or wheezing Abdomen: Soft, nontender, nondistended to palpation. Incisions well-healed. No incisional hernias. Port easily palpated. Extremities: Atraumatic, good range of motion.  ASSESMENT: 51 y.o.  male  s/p AP-Large lap-band.   PLAN: The patient's port was accessed with a 20G Huber needle without difficulty. Clear fluid was aspirated and 5 mL saline was removed to yield an empty band. We're setting up an appointment with Dr. Daphine Deutscher after Christmas.

## 2012-07-13 NOTE — Patient Instructions (Signed)
Focus on good food choices as well as physical activity. Return sooner if you have an increase in hunger, portion sizes or weight. Return also for difficulty swallowing, night cough, reflux.   

## 2012-08-31 ENCOUNTER — Encounter (INDEPENDENT_AMBULATORY_CARE_PROVIDER_SITE_OTHER): Payer: 59 | Admitting: Surgery

## 2013-11-16 ENCOUNTER — Inpatient Hospital Stay (HOSPITAL_COMMUNITY)
Admission: EM | Admit: 2013-11-16 | Discharge: 2013-11-19 | DRG: 603 | Disposition: A | Discharge status: Home / Self Care | Payer: 59 | Attending: Internal Medicine | Admitting: Internal Medicine

## 2013-11-16 ENCOUNTER — Encounter (HOSPITAL_COMMUNITY): Payer: Self-pay | Admitting: Emergency Medicine

## 2013-11-16 DIAGNOSIS — Z9884 Bariatric surgery status: Secondary | ICD-10-CM

## 2013-11-16 DIAGNOSIS — I10 Essential (primary) hypertension: Secondary | ICD-10-CM | POA: Diagnosis present

## 2013-11-16 DIAGNOSIS — W19XXXA Unspecified fall, initial encounter: Secondary | ICD-10-CM | POA: Diagnosis present

## 2013-11-16 DIAGNOSIS — F319 Bipolar disorder, unspecified: Secondary | ICD-10-CM | POA: Diagnosis present

## 2013-11-16 DIAGNOSIS — I872 Venous insufficiency (chronic) (peripheral): Secondary | ICD-10-CM | POA: Diagnosis present

## 2013-11-16 DIAGNOSIS — F329 Major depressive disorder, single episode, unspecified: Secondary | ICD-10-CM

## 2013-11-16 DIAGNOSIS — E119 Type 2 diabetes mellitus without complications: Secondary | ICD-10-CM | POA: Diagnosis present

## 2013-11-16 DIAGNOSIS — F32A Depression, unspecified: Secondary | ICD-10-CM

## 2013-11-16 DIAGNOSIS — Z6841 Body Mass Index (BMI) 40.0 and over, adult: Secondary | ICD-10-CM

## 2013-11-16 DIAGNOSIS — Z79899 Other long term (current) drug therapy: Secondary | ICD-10-CM

## 2013-11-16 DIAGNOSIS — L97209 Non-pressure chronic ulcer of unspecified calf with unspecified severity: Secondary | ICD-10-CM | POA: Diagnosis present

## 2013-11-16 DIAGNOSIS — L0390 Cellulitis, unspecified: Secondary | ICD-10-CM

## 2013-11-16 DIAGNOSIS — L02419 Cutaneous abscess of limb, unspecified: Principal | ICD-10-CM | POA: Diagnosis present

## 2013-11-16 DIAGNOSIS — G4733 Obstructive sleep apnea (adult) (pediatric): Secondary | ICD-10-CM

## 2013-11-16 DIAGNOSIS — L03115 Cellulitis of right lower limb: Secondary | ICD-10-CM

## 2013-11-16 DIAGNOSIS — Z9989 Dependence on other enabling machines and devices: Secondary | ICD-10-CM

## 2013-11-16 DIAGNOSIS — L03119 Cellulitis of unspecified part of limb: Principal | ICD-10-CM

## 2013-11-16 HISTORY — DX: Obesity, unspecified: E66.9

## 2013-11-16 LAB — COMPREHENSIVE METABOLIC PANEL
ALBUMIN: 3.7 g/dL (ref 3.5–5.2)
ALT: 54 U/L — ABNORMAL HIGH (ref 0–53)
AST: 36 U/L (ref 0–37)
Alkaline Phosphatase: 63 U/L (ref 39–117)
BUN: 17 mg/dL (ref 6–23)
CALCIUM: 10.8 mg/dL — AB (ref 8.4–10.5)
CHLORIDE: 104 meq/L (ref 96–112)
CO2: 22 mEq/L (ref 19–32)
CREATININE: 1.13 mg/dL (ref 0.50–1.35)
GFR calc Af Amer: 84 mL/min — ABNORMAL LOW (ref >90)
GFR calc non Af Amer: 73 mL/min — ABNORMAL LOW (ref >90)
Glucose, Bld: 288 mg/dL — ABNORMAL HIGH (ref 70–99)
Potassium: 4.1 mEq/L (ref 3.7–5.3)
Sodium: 140 mEq/L (ref 137–147)
Total Bilirubin: 0.2 mg/dL — ABNORMAL LOW (ref 0.3–1.2)
Total Protein: 7.4 g/dL (ref 6.0–8.3)

## 2013-11-16 LAB — CBC WITH DIFFERENTIAL/PLATELET
BASOS ABS: 0 10*3/uL (ref 0.0–0.1)
BASOS PCT: 0 % (ref 0–1)
Eosinophils Absolute: 0.3 10*3/uL (ref 0.0–0.7)
Eosinophils Relative: 3 % (ref 0–5)
HEMATOCRIT: 40.7 % (ref 39.0–52.0)
Hemoglobin: 13.6 g/dL (ref 13.0–17.0)
Lymphocytes Relative: 20 % (ref 12–46)
Lymphs Abs: 2.1 10*3/uL (ref 0.7–4.0)
MCH: 31.1 pg (ref 26.0–34.0)
MCHC: 33.4 g/dL (ref 30.0–36.0)
MCV: 92.9 fL (ref 78.0–100.0)
MONO ABS: 0.8 10*3/uL (ref 0.1–1.0)
Monocytes Relative: 8 % (ref 3–12)
NEUTROS ABS: 6.9 10*3/uL (ref 1.7–7.7)
Neutrophils Relative %: 68 % (ref 43–77)
Platelets: 306 10*3/uL (ref 150–400)
RBC: 4.38 MIL/uL (ref 4.22–5.81)
RDW: 13.6 % (ref 11.5–15.5)
WBC: 10.2 10*3/uL (ref 4.0–10.5)

## 2013-11-16 NOTE — ED Provider Notes (Signed)
CSN: [id number]     Arrival date & time 11/16/13  2005 History   First MD Initiated Contact with Patient 11/16/13 2336     Chief Complaint  Patient presents with  . Wound Infection     (Consider location/radiation/quality/duration/timing/severity/associated sxs/prior Treatment) HPI  Frank Cox, 53 yo, with a PMH of HTN and DM, comes to the ED with a wound to R lateral lower extremity. He states that he tripped and fell on the pavement approximately 1 week ago and skinned his lower leg. His fall was due to carrying grocery and lost balance. He did not think it was serious and continued about his business. Does notice some throbbing sensation to his R lower leg worsen at night.  He tries propping his leg at night. He has used salve and Motrin to treat pain.  He states that he has noticed over the past couple of days that the wound was not healing and that there is increasing edema, weeping, pain and erythema surrounding the wound. He denies any chest pain or shortness of breath.   Denies fever, chills, numbness.  Does not think he has a diabetes since he has been losing weight.  Does not normally check his CBG.    Past Medical History  Diagnosis Date  . Depression   . Anxiety   . CPAP (continuous positive airway pressure) dependence   . Poor circulation   . Hypertension     boarderline - elevated  . Fatigue   . Diabetes mellitus   . Kidney stone   . Generalized headaches   . Hearing loss   . Contact lens/glasses fitting   . Bruises easily   . Obesity    Past Surgical History  Procedure Laterality Date  . Laparoscopic gastric banding  09/09/09   Family History  Problem Relation Age of Onset  . Arthritis Mother   . Alcohol abuse Mother   . Arthritis Father    History  Substance Use Topics  . Smoking status: Never Smoker   . Smokeless tobacco: Never Used  . Alcohol Use: No    Review of Systems  Constitutional: Negative for fever and chills.  Respiratory: Negative for  shortness of breath.   Cardiovascular: Negative for chest pain.  Gastrointestinal: Negative for abdominal pain.  Musculoskeletal: Negative for arthralgias.  Skin: Positive for rash and wound.  Neurological: Negative for numbness.  All other systems reviewed and are negative.      Allergies  Review of patient's allergies indicates no known allergies.  Home Medications   Current Outpatient Rx  Name  Route  Sig  Dispense  Refill  . Calcium Carb-Cholecalciferol (CALCIUM 600+D3 PO)   Oral   Take 1 tablet by mouth daily.          . citalopram (CELEXA) 40 MG tablet   Oral   Take 40 mg by mouth daily.           . clonazePAM (KLONOPIN) 1 MG tablet   Oral   Take 1 mg by mouth at bedtime.          . diphenhydrAMINE (BENADRYL) 25 MG tablet   Oral   Take 50 mg by mouth every 6 (six) hours as needed for allergies.         Marland Kitchen lamoTRIgine (LAMICTAL) 200 MG tablet   Oral   Take 200 mg by mouth at bedtime.          Marland Kitchen lithium carbonate (ESKALITH) 450 MG CR tablet   Oral  Take 450 mg by mouth at bedtime.          Marland Kitchen lurasidone (LATUDA) 40 MG TABS   Oral   Take 40 mg by mouth at bedtime.          . metoprolol succinate (TOPROL-XL) 25 MG 24 hr tablet   Oral   Take 25 mg by mouth every morning.          . Multiple Vitamin (MULTIVITAMIN PO)   Oral   Take 1 tablet by mouth every morning. [employer] brand Solotron Chewable.          BP 162/96  Pulse 73  Temp(Src) 98.6 F (37 C) (Oral)  Resp 16  Ht 5\' 10"  (1.778 m)  Wt 359 lb (162.841 kg)  BMI 51.51 kg/m2  SpO2 97% Physical Exam  Nursing note and vitals reviewed. Constitutional: He is oriented to person, place, and time. He appears well-developed and well-nourished. No distress.  Patient is morbidly obese, in no acute distress.  HENT:  Head: Normocephalic and atraumatic.  Mouth/Throat: Oropharynx is clear and moist.  Neck: Neck supple.  Cardiovascular: Normal rate and regular rhythm.   Pulmonary/Chest: Effort  normal and breath sounds normal. He has no wheezes. He has no rales.  Abdominal: Soft. There is no tenderness.  Musculoskeletal: He exhibits tenderness (Right tib-fib:  significant erythema noted spanning from proximal lower leg down to his mid calf with warmth, erythema, and edema. A large ulcerated of lesion noted to the anterior mid tib-fib measuring 3 x 5" actively weeping but no fluctuance.).  Right knee with normal flexion extension, right ankle with full range of motion.  Intact dorsalis pedis and posterior tibialis pulses bilaterally.    Neurological: He is alert and oriented to person, place, and time.  Skin: Skin is warm. Rash (see Musculoskeletal) noted. There is erythema.  BLE with brawny hyperpigmented skin changes consistence with chronic venous stasis  Scaly skin changes to soles of feet compatible with tinea pedis.  Psychiatric: He has a normal mood and affect.    ED Course  Procedures (including critical care time)  12:21 AM Patient presents with evidence of extensive cellulitis to his right lower extremities. Skin were marked and dated. She has an ulcerated skin changes to his mid anterior tib-fib but no obvious abscess. Given his state of health, and extensive cellulitis, will treat with IV clindamycin and will have patient admitted for further management of his cellulitis. X-ray of tib-fib order to rule out osteomyelitis. Patient is currently afebrile with stable normal vital signs and no evidence of systemic infection. His blood pressure is elevated, patient has history of hypertension and currently taking metoprolol.  12:38 AM I have consulted with [facility] Dr. Posey Pronto who will contact Dr. Lita Mains once xray has been done.    1:05 AM Dr. Waunita Schooner to admit pt to med surg, team 10, under his care.  Will switch IV abx to vanc/zosyn for better coverage of pseudomonas in a diabetic pt.    Labs Review Labs Reviewed  COMPREHENSIVE METABOLIC PANEL - Abnormal;  Notable for the following:    Glucose, Bld 288 (*)    Calcium 10.8 (*)    ALT 54 (*)    Total Bilirubin 0.2 (*)    GFR calc non Af Amer 73 (*)    GFR calc Af Amer 84 (*)    All other components within normal limits  I-STAT CG4 LACTIC ACID, ED - Abnormal; Notable for the following:    Lactic Acid,  Venous 2.47 (*)    All other components within normal limits  CBC WITH DIFFERENTIAL   Imaging Review Dg Tibia/fibula Right  11/17/2013   CLINICAL DATA:  Worsening redness, swelling and pain associated with a mid right lower leg wound. Clinical concern for osteomyelitis. Diabetes.  EXAM: RIGHT TIBIA AND FIBULA - 2 VIEW  COMPARISON:  Right ankle CT dated 09/21/2007.  FINDINGS: Diffuse soft tissue swelling, most pronounced anterior to the proximal tibial shaft. Mild focal hyperostosis arising from the lateral aspect of the distal fibular shaft. No soft tissue gas, bone destruction or periosteal reaction suspicious for osteomyelitis. Mild right knee degenerative changes.  IMPRESSION: Soft tissue swelling without evidence of underlying osteomyelitis.   Electronically Signed   By: Enrique Sack M.D.   On: 11/17/2013 00:59     EKG Interpretation None      MDM   Final diagnoses:  Cellulitis of right leg without foot    BP 168/80  Pulse 83  Temp(Src) 98.6 F (37 C) (Oral)  Resp 16  Ht 5\' 10"  (1.778 m)  Wt 359 lb (162.841 kg)  BMI 51.51 kg/m2  SpO2 99%  I have reviewed nursing notes and vital signs. I personally reviewed the imaging tests through PACS system  I reviewed available ER/hospitalization records thought the EMR     Domenic Moras, [place] 11/17/13 8315

## 2013-11-16 NOTE — ED Notes (Signed)
Pt. reports worsening infected abrasion at his right lower lateral shin sustained last Saturday when he tripped and fell , presents with drainage and swelling at abraded skin at right lateral shin . No fever or chills.

## 2013-11-17 ENCOUNTER — Emergency Department (HOSPITAL_COMMUNITY): Payer: 59

## 2013-11-17 DIAGNOSIS — L02419 Cutaneous abscess of limb, unspecified: Principal | ICD-10-CM

## 2013-11-17 DIAGNOSIS — E119 Type 2 diabetes mellitus without complications: Secondary | ICD-10-CM

## 2013-11-17 DIAGNOSIS — L03115 Cellulitis of right lower limb: Secondary | ICD-10-CM | POA: Insufficient documentation

## 2013-11-17 DIAGNOSIS — L0390 Cellulitis, unspecified: Secondary | ICD-10-CM | POA: Diagnosis present

## 2013-11-17 DIAGNOSIS — F32A Depression, unspecified: Secondary | ICD-10-CM | POA: Diagnosis present

## 2013-11-17 DIAGNOSIS — G4733 Obstructive sleep apnea (adult) (pediatric): Secondary | ICD-10-CM | POA: Diagnosis present

## 2013-11-17 DIAGNOSIS — F329 Major depressive disorder, single episode, unspecified: Secondary | ICD-10-CM | POA: Diagnosis present

## 2013-11-17 DIAGNOSIS — L03119 Cellulitis of unspecified part of limb: Principal | ICD-10-CM

## 2013-11-17 LAB — COMPREHENSIVE METABOLIC PANEL
ALT: 48 U/L (ref 0–53)
AST: 33 U/L (ref 0–37)
Albumin: 3.7 g/dL (ref 3.5–5.2)
Alkaline Phosphatase: 64 U/L (ref 39–117)
BUN: 13 mg/dL (ref 6–23)
CALCIUM: 10.3 mg/dL (ref 8.4–10.5)
CO2: 21 mEq/L (ref 19–32)
Chloride: 108 mEq/L (ref 96–112)
Creatinine, Ser: 1.14 mg/dL (ref 0.50–1.35)
GFR calc Af Amer: 83 mL/min — ABNORMAL LOW (ref >90)
GFR calc non Af Amer: 72 mL/min — ABNORMAL LOW (ref >90)
Glucose, Bld: 235 mg/dL — ABNORMAL HIGH (ref 70–99)
Potassium: 4.3 mEq/L (ref 3.7–5.3)
SODIUM: 145 meq/L (ref 137–147)
TOTAL PROTEIN: 7.2 g/dL (ref 6.0–8.3)
Total Bilirubin: 0.5 mg/dL (ref 0.3–1.2)

## 2013-11-17 LAB — GLUCOSE, CAPILLARY
GLUCOSE-CAPILLARY: 214 mg/dL — AB (ref 70–99)
GLUCOSE-CAPILLARY: 181 mg/dL — AB (ref 70–99)
Glucose-Capillary: 222 mg/dL — ABNORMAL HIGH (ref 70–99)
Glucose-Capillary: 162 mg/dL — ABNORMAL HIGH (ref 70–99)
Glucose-Capillary: 121 mg/dL — ABNORMAL HIGH (ref 70–99)

## 2013-11-17 LAB — PROTIME-INR
INR: 1.03 (ref 0.00–1.49)
Prothrombin Time: 13.3 seconds (ref 11.6–15.2)

## 2013-11-17 LAB — CBC
HEMATOCRIT: 40.6 % (ref 39.0–52.0)
HEMOGLOBIN: 13.5 g/dL (ref 13.0–17.0)
MCH: 31.1 pg (ref 26.0–34.0)
MCHC: 33.3 g/dL (ref 30.0–36.0)
MCV: 93.5 fL (ref 78.0–100.0)
Platelets: 324 10*3/uL (ref 150–400)
RBC: 4.34 MIL/uL (ref 4.22–5.81)
RDW: 13.6 % (ref 11.5–15.5)
WBC: 10.9 10*3/uL — AB (ref 4.0–10.5)

## 2013-11-17 LAB — HEMOGLOBIN A1C
Hgb A1c MFr Bld: 7.4 % — ABNORMAL HIGH (ref <5.7)
Mean Plasma Glucose: 166 mg/dL — ABNORMAL HIGH (ref <117)

## 2013-11-17 LAB — LITHIUM LEVEL: LITHIUM LVL: 0.55 meq/L — AB (ref 0.80–1.40)

## 2013-11-17 LAB — I-STAT CG4 LACTIC ACID, ED: Lactic Acid, Venous: 2.47 mmol/L — ABNORMAL HIGH (ref 0.5–2.2)

## 2013-11-17 MED ORDER — ONDANSETRON HCL 4 MG PO TABS: 4.0000 mg | ORAL_TABLET | Freq: Four times a day (QID) | ORAL | Status: DC | PRN

## 2013-11-17 MED ORDER — LURASIDONE HCL 40 MG PO TABS
40.0000 mg | ORAL_TABLET | Freq: Every evening | ORAL | Status: DC | PRN
  Administered 2013-11-18: 40 mg via ORAL
  Filled 2013-11-17: qty 1

## 2013-11-17 MED ORDER — INSULIN GLARGINE 100 UNIT/ML ~~LOC~~ SOLN
8.0000 [IU] | Freq: Every day | SUBCUTANEOUS | Status: DC
  Filled 2013-11-17 (×2): qty 0.08

## 2013-11-17 MED ORDER — PIPERACILLIN-TAZOBACTAM 3.375 G IVPB
3.3750 g | Freq: Three times a day (TID) | INTRAVENOUS | Status: DC
  Administered 2013-11-17 – 2013-11-19 (×7): 3.375 g via INTRAVENOUS
  Filled 2013-11-17 (×8): qty 50

## 2013-11-17 MED ORDER — ONDANSETRON HCL 4 MG/2ML IJ SOLN: 4.0000 mg | Freq: Four times a day (QID) | INTRAMUSCULAR | Status: DC | PRN

## 2013-11-17 MED ORDER — ACETAMINOPHEN 650 MG RE SUPP: 650.0000 mg | Freq: Four times a day (QID) | RECTAL | Status: DC | PRN

## 2013-11-17 MED ORDER — VANCOMYCIN HCL 10 G IV SOLR
1500.0000 mg | Freq: Two times a day (BID) | INTRAVENOUS | Status: DC
  Administered 2013-11-17 – 2013-11-19 (×4): 1500 mg via INTRAVENOUS
  Filled 2013-11-17 (×5): qty 1500

## 2013-11-17 MED ORDER — CLINDAMYCIN PHOSPHATE 600 MG/50ML IV SOLN
600.0000 mg | Freq: Once | INTRAVENOUS | Status: DC
  Filled 2013-11-17: qty 50

## 2013-11-17 MED ORDER — LURASIDONE HCL 40 MG PO TABS
40.0000 mg | ORAL_TABLET | Freq: Once | ORAL | Status: DC
  Administered 2013-11-17: 40 mg via ORAL
  Filled 2013-11-17: qty 1

## 2013-11-17 MED ORDER — DIPHENHYDRAMINE HCL 25 MG PO TABS
50.0000 mg | ORAL_TABLET | Freq: Four times a day (QID) | ORAL | Status: DC | PRN
  Administered 2013-11-18: 50 mg via ORAL
  Filled 2013-11-17 (×2): qty 2

## 2013-11-17 MED ORDER — INSULIN ASPART 100 UNIT/ML ~~LOC~~ SOLN
0.0000 [IU] | Freq: Every day | SUBCUTANEOUS | Status: DC
  Administered 2013-11-17: 2 [IU] via SUBCUTANEOUS

## 2013-11-17 MED ORDER — ONDANSETRON HCL 4 MG/2ML IJ SOLN: 4.0000 mg | Freq: Three times a day (TID) | INTRAMUSCULAR | Status: DC | PRN

## 2013-11-17 MED ORDER — SODIUM CHLORIDE 0.9 % IV BOLUS (SEPSIS)
1000.0000 mL | Freq: Once | INTRAVENOUS | Status: AC
  Administered 2013-11-17: 1000 mL via INTRAVENOUS

## 2013-11-17 MED ORDER — LAMOTRIGINE 200 MG PO TABS
200.0000 mg | ORAL_TABLET | Freq: Once | ORAL | Status: DC
  Administered 2013-11-17: 200 mg via ORAL
  Filled 2013-11-17: qty 1

## 2013-11-17 MED ORDER — GLIMEPIRIDE 4 MG PO TABS
4.0000 mg | ORAL_TABLET | Freq: Every day | ORAL | Status: DC
  Administered 2013-11-17 – 2013-11-18 (×2): 4 mg via ORAL
  Filled 2013-11-17 (×3): qty 1

## 2013-11-17 MED ORDER — MORPHINE SULFATE 4 MG/ML IJ SOLN
4.0000 mg | Freq: Once | INTRAMUSCULAR | Status: DC
  Filled 2013-11-17: qty 1

## 2013-11-17 MED ORDER — SODIUM CHLORIDE 0.9 % IV SOLN
INTRAVENOUS | Status: DC
  Administered 2013-11-17: 17:00:00 via INTRAVENOUS

## 2013-11-17 MED ORDER — VANCOMYCIN HCL 10 G IV SOLR
2500.0000 mg | Freq: Once | INTRAVENOUS | Status: DC
  Filled 2013-11-17: qty 2500

## 2013-11-17 MED ORDER — CLONAZEPAM 1 MG PO TABS
1.0000 mg | ORAL_TABLET | Freq: Every evening | ORAL | Status: DC | PRN
  Administered 2013-11-18: 1 mg via ORAL
  Filled 2013-11-17: qty 1

## 2013-11-17 MED ORDER — LITHIUM CARBONATE ER 450 MG PO TBCR
450.0000 mg | EXTENDED_RELEASE_TABLET | Freq: Once | ORAL | Status: DC
  Administered 2013-11-17: 450 mg via ORAL
  Filled 2013-11-17: qty 1

## 2013-11-17 MED ORDER — VANCOMYCIN HCL 10 G IV SOLR
2500.0000 mg | Freq: Once | INTRAVENOUS | Status: DC
  Administered 2013-11-17: 2500 mg via INTRAVENOUS
  Filled 2013-11-17 (×2): qty 2500

## 2013-11-17 MED ORDER — CITALOPRAM HYDROBROMIDE 40 MG PO TABS
40.0000 mg | ORAL_TABLET | Freq: Every day | ORAL | Status: DC
  Administered 2013-11-17 – 2013-11-19 (×3): 40 mg via ORAL
  Filled 2013-11-17 (×3): qty 1

## 2013-11-17 MED ORDER — SODIUM CHLORIDE 0.9 % IV SOLN: INTRAVENOUS | Status: DC

## 2013-11-17 MED ORDER — CLONAZEPAM 1 MG PO TABS
1.0000 mg | ORAL_TABLET | Freq: Every day | ORAL | Status: DC
  Administered 2013-11-17: 1 mg via ORAL
  Filled 2013-11-17: qty 1

## 2013-11-17 MED ORDER — INFLUENZA VAC SPLIT QUAD 0.5 ML IM SUSP
0.5000 mL | INTRAMUSCULAR | Status: AC
  Administered 2013-11-18: 0.5 mL via INTRAMUSCULAR
  Filled 2013-11-17: qty 0.5

## 2013-11-17 MED ORDER — LITHIUM CARBONATE ER 450 MG PO TBCR: 1800.0000 mg | EXTENDED_RELEASE_TABLET | Freq: Every day | ORAL | Status: DC

## 2013-11-17 MED ORDER — ACETAMINOPHEN 325 MG PO TABS
650.0000 mg | ORAL_TABLET | Freq: Four times a day (QID) | ORAL | Status: DC | PRN
  Administered 2013-11-17 – 2013-11-18 (×5): 650 mg via ORAL
  Filled 2013-11-17 (×6): qty 2

## 2013-11-17 MED ORDER — LAMOTRIGINE 200 MG PO TABS
200.0000 mg | ORAL_TABLET | Freq: Two times a day (BID) | ORAL | Status: DC
  Administered 2013-11-17 – 2013-11-19 (×5): 200 mg via ORAL
  Filled 2013-11-17 (×6): qty 1

## 2013-11-17 MED ORDER — INSULIN ASPART 100 UNIT/ML ~~LOC~~ SOLN
0.0000 [IU] | Freq: Three times a day (TID) | SUBCUTANEOUS | Status: DC
  Administered 2013-11-17 (×2): 3 [IU] via SUBCUTANEOUS
  Administered 2013-11-17: 5 [IU] via SUBCUTANEOUS
  Administered 2013-11-18: 3 [IU] via SUBCUTANEOUS
  Administered 2013-11-19: 2 [IU] via SUBCUTANEOUS

## 2013-11-17 MED ORDER — LITHIUM CARBONATE ER 450 MG PO TBCR
1800.0000 mg | EXTENDED_RELEASE_TABLET | Freq: Every day | ORAL | Status: DC
  Administered 2013-11-17: 1350 mg via ORAL
  Administered 2013-11-18 (×2): 1800 mg via ORAL
  Filled 2013-11-17 (×5): qty 4

## 2013-11-17 MED ORDER — HYDROMORPHONE HCL PF 1 MG/ML IJ SOLN: 1.0000 mg | INTRAMUSCULAR | Status: DC | PRN

## 2013-11-17 MED ORDER — METOPROLOL SUCCINATE ER 25 MG PO TB24
25.0000 mg | ORAL_TABLET | ORAL | Status: DC
  Administered 2013-11-17 – 2013-11-19 (×3): 25 mg via ORAL
  Filled 2013-11-17 (×4): qty 1

## 2013-11-17 MED ORDER — HEPARIN SODIUM (PORCINE) 5000 UNIT/ML IJ SOLN
5000.0000 [IU] | Freq: Three times a day (TID) | INTRAMUSCULAR | Status: DC
  Administered 2013-11-17 – 2013-11-19 (×7): 5000 [IU] via SUBCUTANEOUS
  Filled 2013-11-17 (×10): qty 1

## 2013-11-17 MED ORDER — PIPERACILLIN-TAZOBACTAM 3.375 G IVPB 30 MIN
3.3750 g | Freq: Once | INTRAVENOUS | Status: AC
  Administered 2013-11-17: 3.375 g via INTRAVENOUS
  Filled 2013-11-17: qty 50

## 2013-11-17 NOTE — H&P (Signed)
[facility] History and Physical  Patient: Frank Cox  TLX:[id number]  DOB: 1960-12-16  DOS: the patient was seen and examined on 11/17/2013 PCP: Gara Kroner, MD  Chief Complaint: leg redness  HPI: Frank Cox is a 53 y.o. male with Past medical history of diabetes, chronic venous insufficiency, hypertension, bipolar disorder on lithium, morbid obesity. The patient is coming from home. The patient presents with complaints of right leg redness and pain. He mentions that he had mechanical fall few days ago at which time he had injury to the shin of his right leg. He was putting a dry dressing and some ointment. Despite which he continues to have progressively worsening redness and swelling of his right leg associated with mild tingling and pain superficially. He has been able to ambulate and does not have any complaint of pain in his ankle or knee. He denies any fever or tiredness malaise joint pains. He does not take any medications at present for diabetes since he was told that he was diabetes free, he was in the past was on metformin. He also has been diagnosed with chronic venous insufficiency. Pt denies any fever, chills, headache, cough, chest pain, palpitation, shortness of breath, orthopnea, PND, nausea, vomiting, abdominal pain, diarrhea, constipation, active bleeding, burning urination, dizziness, focal neurological deficit.   Review of Systems: as mentioned in the history of present illness.  A Comprehensive review of the other systems is negative.  Past Medical History  Diagnosis Date  . Depression   . Anxiety   . CPAP (continuous positive airway pressure) dependence   . Poor circulation   . Hypertension     boarderline - elevated  . Fatigue   . Diabetes mellitus   . Kidney stone   . Generalized headaches   . Hearing loss   . Contact lens/glasses fitting   . Bruises easily   . Obesity    Past Surgical History  Procedure Laterality Date  .  Laparoscopic gastric banding  09/09/09   Social History:  reports that he has never smoked. He has never used smokeless tobacco. He reports that he does not drink alcohol or use illicit drugs. Independent for most of his  ADL.  No Known Allergies  Family History  Problem Relation Age of Onset  . Arthritis Mother   . Alcohol abuse Mother   . Arthritis Father     Prior to Admission medications   Medication Sig Start Date End Date Taking? Authorizing Provider  Calcium Carb-Cholecalciferol (CALCIUM 600+D3 PO) Take 1 tablet by mouth daily.    Yes Historical Provider, MD  citalopram (CELEXA) 40 MG tablet Take 40 mg by mouth daily.     Yes Historical Provider, MD  clonazePAM (KLONOPIN) 1 MG tablet Take 1 mg by mouth at bedtime.    Yes Historical Provider, MD  diphenhydrAMINE (BENADRYL) 25 MG tablet Take 50 mg by mouth every 6 (six) hours as needed for allergies.   Yes Historical Provider, MD  lamoTRIgine (LAMICTAL) 200 MG tablet Take 200 mg by mouth at bedtime.    Yes Historical Provider, MD  lithium carbonate (ESKALITH) 450 MG CR tablet Take 1,800 mg by mouth at bedtime.    Yes Historical Provider, MD  lurasidone (LATUDA) 40 MG TABS Take 40 mg by mouth at bedtime.    Yes Historical Provider, MD  metoprolol succinate (TOPROL-XL) 25 MG 24 hr tablet Take 25 mg by mouth every morning.  04/29/11  Yes Historical Provider, MD  Multiple Vitamin (MULTIVITAMIN  PO) Take 1 tablet by mouth every morning. [employer] brand Solotron Chewable.   Yes Historical Provider, MD    Physical Exam: Filed Vitals:   11/16/13 2250 11/16/13 2348 11/17/13 0000 11/17/13 0130  BP: 162/96 159/79 168/80 156/70  Pulse: 73  83 81  Temp: 98.6 F (37 C)     TempSrc: Oral     Resp:      Height:      Weight:      SpO2: 97%  99% 99%    General: Alert, Awake and Oriented to Time, Place and Person. Appear in mild distress Eyes: PERRL ENT: Oral Mucosa clear moist. Neck: no JVD Cardiovascular: S1 and S2 Present, no Murmur,  Peripheral Pulses Present Respiratory: Bilateral Air entry equal and Decreased, Clear to Auscultation,  no Crackles,no wheezes Abdomen: Bowel Sound Present, Soft and Non tender Skin: no Rash Extremities: Bilateral Pedal edema, no calf tenderness, right leg shin superficial ulcer with sloughing, redness and induration of the surrounding area. Neurologic: Grossly Unremarkable. Labs on Admission:  CBC:  Recent Labs Lab 11/16/13 2018  WBC 10.2  NEUTROABS 6.9  HGB 13.6  HCT 40.7  MCV 92.9  PLT 306    CMP     Component Value Date/Time   NA 140 11/16/2013 2018   K 4.1 11/16/2013 2018   CL 104 11/16/2013 2018   CO2 22 11/16/2013 2018   GLUCOSE 288* 11/16/2013 2018   BUN 17 11/16/2013 2018   CREATININE 1.13 11/16/2013 2018   CALCIUM 10.8* 11/16/2013 2018   PROT 7.4 11/16/2013 2018   ALBUMIN 3.7 11/16/2013 2018   AST 36 11/16/2013 2018   ALT 54* 11/16/2013 2018   ALKPHOS 63 11/16/2013 2018   BILITOT 0.2* 11/16/2013 2018   GFRNONAA 73* 11/16/2013 2018   GFRAA 84* 11/16/2013 2018    No results found for this basename: LIPASE, AMYLASE,  in the last 168 hours No results found for this basename: AMMONIA,  in the last 168 hours  No results found for this basename: CKTOTAL, CKMB, CKMBINDEX, TROPONINI,  in the last 168 hours BNP (last 3 results) No results found for this basename: PROBNP,  in the last 8760 hours  Radiological Exams on Admission: Dg Tibia/fibula Right  11/17/2013   CLINICAL DATA:  Worsening redness, swelling and pain associated with a mid right lower leg wound. Clinical concern for osteomyelitis. Diabetes.  EXAM: RIGHT TIBIA AND FIBULA - 2 VIEW  COMPARISON:  Right ankle CT dated 09/21/2007.  FINDINGS: Diffuse soft tissue swelling, most pronounced anterior to the proximal tibial shaft. Mild focal hyperostosis arising from the lateral aspect of the distal fibular shaft. No soft tissue gas, bone destruction or periosteal reaction suspicious for osteomyelitis. Mild right knee  degenerative changes.  IMPRESSION: Soft tissue swelling without evidence of underlying osteomyelitis.   Electronically Signed   By: Enrique Sack M.D.   On: 11/17/2013 00:59     Assessment/Plan Principal Problem:   Cellulitis Active Problems:   Lapband APL Jan 2011   Diabetes mellitus, type II   Depression   OSA (obstructive sleep apnea)   1. Cellulitis The patient is presenting with complaints of right leg redness after an injury. His legs appear swollen and red and has warmth. He appears to cellulitis. X-ray of the leg is not showing any bone involvement. Patient will be admitted to the hospital for initial IV antibiotics I will continue treating him with IV vancomycin and IV Zosyn. Check ESR and CRP.  2.Diabetes mellitus  The patient is  not on any medication at present has significantly elevated blood glucose. patient will be started on insulin sliding scale and Lantus, may need diabetes teaching .  3.History of depression and bipolar disorder  Continue Lamictal and lithium at home dose  Continue Celexa and clonazepam   4.Morbid obesity Obstructive sleep apnea Recommend diet and exercise at present on cardiac diet  DVT Prophylaxis: subcutaneous Heparin Nutrition: Cardiac diet and diabetic diet  Code Status: Full  Disposition: Admitted to inpatient in med-surge unit.  Author: Berle Mull, MD [facility] Pager: [phone] 11/17/2013, 2:13 AM    If 7PM-7AM, please contact night-coverage [email or web address] Password [email or web address]

## 2013-11-17 NOTE — ED Notes (Signed)
Patient transported to X-ray 

## 2013-11-17 NOTE — ED Provider Notes (Signed)
Medical screening examination/treatment/procedure(s) were performed by non-physician practitioner and as supervising physician I was immediately available for consultation/collaboration.   EKG Interpretation None        Osvaldo Shipper, MD 11/17/13 2256

## 2013-11-17 NOTE — Progress Notes (Signed)
ANTIBIOTIC CONSULT NOTE - INITIAL  Pharmacy Consult for Vancocin and Zosyn Indication: cellulitis  No Known Allergies  Patient Measurements: Height: 5\' 10"  (177.8 cm) Weight: 359 lb (162.841 kg) IBW/kg (Calculated) : 73  Vital Signs: Temp: 98.6 F (37 C) (03/13 2250) Temp src: Oral (03/13 2250) BP: 156/70 mmHg (03/14 0130) Pulse Rate: 81 (03/14 0130)  Labs:  Recent Labs  11/16/13 2018  WBC 10.2  HGB 13.6  PLT 306  CREATININE 1.13   Estimated Creatinine Clearance: 116.4 ml/min (by C-G formula based on Cr of 1.13).   Microbiology: No results found for this or any previous visit (from the past 720 hour(s)).  Medical History: Past Medical History  Diagnosis Date  . Depression   . Anxiety   . CPAP (continuous positive airway pressure) dependence   . Poor circulation   . Hypertension     boarderline - elevated  . Fatigue   . Diabetes mellitus   . Kidney stone   . Generalized headaches   . Hearing loss   . Contact lens/glasses fitting   . Bruises easily   . Obesity     Medications:  Prescriptions prior to admission  Medication Sig Dispense Refill  . Calcium Carb-Cholecalciferol (CALCIUM 600+D3 PO) Take 1 tablet by mouth daily.       . citalopram (CELEXA) 40 MG tablet Take 40 mg by mouth daily.        . clonazePAM (KLONOPIN) 1 MG tablet Take 1 mg by mouth at bedtime.       . diphenhydrAMINE (BENADRYL) 25 MG tablet Take 50 mg by mouth every 6 (six) hours as needed for allergies.      Marland Kitchen lamoTRIgine (LAMICTAL) 200 MG tablet Take 200 mg by mouth at bedtime.       Marland Kitchen lithium carbonate (ESKALITH) 450 MG CR tablet Take 1,800 mg by mouth at bedtime.       Marland Kitchen lurasidone (LATUDA) 40 MG TABS Take 40 mg by mouth at bedtime.       . metoprolol succinate (TOPROL-XL) 25 MG 24 hr tablet Take 25 mg by mouth every morning.       . Multiple Vitamin (MULTIVITAMIN PO) Take 1 tablet by mouth every morning. [employer] brand Solotron Chewable.       Scheduled:  . citalopram  40 mg Oral  Daily  . heparin  5,000 Units Subcutaneous 3 times per day  . insulin aspart  0-15 Units Subcutaneous TID WC  . insulin aspart  0-5 Units Subcutaneous QHS  . insulin glargine  8 Units Subcutaneous QHS  . lamoTRIgine  200 mg Oral BID  . lithium carbonate  1,800 mg Oral QHS  . metoprolol succinate  25 mg Oral [id number]  . piperacillin-tazobactam (ZOSYN)  IV  3.375 g Intravenous Q8H  . vancomycin  1,500 mg Intravenous Q12H  . vancomycin  2,500 mg Intravenous Once   Infusions:  . sodium chloride      Assessment: 53yo male tripped and fell 1wk ago, now c/o worsening drainage and swelling at abrasion of leg, no evidence of osteo on Xray, to begin IV ABX.  Goal of Therapy:  Vancomycin trough level 10-15 mcg/ml  Plan:  Rec'd Zosyn 3.375g IV in ED; will continue with vancomycin 2500mg  IV x1 followed by 1500mg  IV Q12H and Zosyn 3.375g IV Q8H and monitor CBC, Cx, levels prn.  Wynona Neat, PharmD, BCPS  11/17/2013,3:25 AM

## 2013-11-17 NOTE — Progress Notes (Signed)
Admitted after midnight. Chart reviewed. Patient examined. Cellulitis improving, but has a large ulcer that may be difficult to heal. Will consult wound care. Would not resume metformin as he had some mild lactic acidosis on admission. Will start Amaryl. Consult dietitian. Patient reports that he drinks a lot of sugary drinks and does not follow a diabetic diet. Likely home soon. Continue IV antibiotics for 24 hours.  Doree Barthel, M.D. [facility] [phone]

## 2013-11-17 NOTE — ED Notes (Signed)
Notified MD and RN of elevated istat lactic acid.

## 2013-11-17 NOTE — ED Notes (Signed)
MD at bedside. 

## 2013-11-18 LAB — GLUCOSE, CAPILLARY
GLUCOSE-CAPILLARY: 118 mg/dL — AB (ref 70–99)
GLUCOSE-CAPILLARY: 98 mg/dL (ref 70–99)
GLUCOSE-CAPILLARY: 125 mg/dL — AB (ref 70–99)
Glucose-Capillary: 158 mg/dL — ABNORMAL HIGH (ref 70–99)
Glucose-Capillary: 179 mg/dL — ABNORMAL HIGH (ref 70–99)

## 2013-11-18 MED ORDER — GLIMEPIRIDE 4 MG PO TABS
8.0000 mg | ORAL_TABLET | Freq: Every day | ORAL | Status: DC
  Administered 2013-11-19: 8 mg via ORAL
  Filled 2013-11-18 (×2): qty 2

## 2013-11-18 NOTE — Consult Note (Signed)
WOC wound consult note Reason for Consult: Venous stasis ulcer to right lateral calf.  Cellulitis Wound type: Venous stasis Measurement: 5.5 cm x 3 cm x 0.1 cm Wound bed: 100% beefy red Drainage (amount, consistency, odor) Minimal, serosanguinous, no odor.  Periwound: Hemosiderian staining noted to bilateral calves.   Dressing procedure/placement/frequency: Cleanse ulcer to right calf with NS and pat gently dry.  Apply Mepitel silicone contact layer to wound bed.  Top with 4x4 and kerlix.  Secure with tape. Change daily.   Patient wears compression at home.  States he usually just wears ACE wrap and kerlix. Suggested referral to wound care center for ongoing venous insufficiency care.  Educated that compression will be lifelong.  Stated he would be willing to go.  Will not follow at this time.  Please re-consult if needed.  Domenic Moras RN BSN [place] Pager [phone]

## 2013-11-18 NOTE — Progress Notes (Signed)
PROGRESS NOTE  Frank Cox UXN:[id number] DOB: July 27, 1961 DOA: 11/16/2013 PCP: Gara Kroner, MD  Assessment/Plan: Cellulitis  - right leg redness after an injury.  -legs improved - X-ray of the leg is not showing any bone involvement.  -IV vancomycin and IV Zosyn- change to PO in AM and d/c   Diabetes mellitus  SSI  History of depression and bipolar disorder  Continue Lamictal and lithium at home dose  Continue Celexa and clonazepam   Morbid obesity  Obstructive sleep apnea  Recommend diet and exercise at present on cardiac diet   Code Status: full Family Communication: patient Disposition Plan:    Consultants:  none  Procedures:     HPI/Subjective: Leg is much improved No CP, no SOB No fevers, no chills  Objective: Filed Vitals:   11/18/13 0500  BP: 157/78  Pulse: 85  Temp: 98.6 F (37 C)  Resp: 20    Intake/Output Summary (Last 24 hours) at 11/18/13 1015 Last data filed at 11/18/13 0859  Gross per 24 hour  Intake   2098 ml  Output      0 ml  Net   2098 ml   Filed Weights   11/16/13 2008 11/17/13 2049 11/18/13 0500  Weight: 162.841 kg (359 lb) 159.167 kg (350 lb 14.4 oz) 157.761 kg (347 lb 12.8 oz)    Exam:  General: Alert, Awake and Oriented to Time, Place and Person. NAD  Cardiovascular: S1 and S2 Present, no Murmur, Peripheral Pulses Present  Respiratory: Bilateral Air entry equal and Decreased, Clear to Auscultation,  no Crackles,no wheezes  Abdomen: Bowel Sound Present, Soft and Non tender  Extremities: Bilateral Pedal edema, no calf tenderness, right leg shin superficial ulcer with sloughing, redness and induration of the surrounding area. - open wound on shin    Data Reviewed: Basic Metabolic Panel:  Recent Labs Lab 11/16/13 2018 11/17/13 0420  NA 140 145  K 4.1 4.3  CL 104 108  CO2 22 21  GLUCOSE 288* 235*  BUN 17 13  CREATININE 1.13 1.14  CALCIUM 10.8* 10.3   Liver Function Tests:  Recent Labs Lab  11/16/13 2018 11/17/13 0420  AST 36 33  ALT 54* 48  ALKPHOS 63 64  BILITOT 0.2* 0.5  PROT 7.4 7.2  ALBUMIN 3.7 3.7   No results found for this basename: LIPASE, AMYLASE,  in the last 168 hours No results found for this basename: AMMONIA,  in the last 168 hours CBC:  Recent Labs Lab 11/16/13 2018 11/17/13 0420  WBC 10.2 10.9*  NEUTROABS 6.9  --   HGB 13.6 13.5  HCT 40.7 40.6  MCV 92.9 93.5  PLT 306 324   Cardiac Enzymes: No results found for this basename: CKTOTAL, CKMB, CKMBINDEX, TROPONINI,  in the last 168 hours BNP (last 3 results) No results found for this basename: PROBNP,  in the last 8760 hours CBG:  Recent Labs Lab 11/17/13 1110 11/17/13 1624 11/17/13 2103 11/18/13 0648 11/18/13 0655  GLUCAP 181* 162* 121* 179* 158*    No results found for this or any previous visit (from the past 240 hour(s)).   Studies: Dg Tibia/fibula Right  11/17/2013   CLINICAL DATA:  Worsening redness, swelling and pain associated with a mid right lower leg wound. Clinical concern for osteomyelitis. Diabetes.  EXAM: RIGHT TIBIA AND FIBULA - 2 VIEW  COMPARISON:  Right ankle CT dated 09/21/2007.  FINDINGS: Diffuse soft tissue swelling, most pronounced anterior to the proximal tibial shaft. Mild focal hyperostosis arising from  the lateral aspect of the distal fibular shaft. No soft tissue gas, bone destruction or periosteal reaction suspicious for osteomyelitis. Mild right knee degenerative changes.  IMPRESSION: Soft tissue swelling without evidence of underlying osteomyelitis.   Electronically Signed   By: Enrique Sack M.D.   On: 11/17/2013 00:59    Scheduled Meds: . citalopram  40 mg Oral Daily  . glimepiride  4 mg Oral Q breakfast  . heparin  5,000 Units Subcutaneous 3 times per day  . insulin aspart  0-15 Units Subcutaneous TID WC  . insulin aspart  0-5 Units Subcutaneous QHS  . lamoTRIgine  200 mg Oral BID  . lithium carbonate  1,800 mg Oral QHS  . metoprolol succinate  25 mg  Oral [id number]  . piperacillin-tazobactam (ZOSYN)  IV  3.375 g Intravenous Q8H  . vancomycin  1,500 mg Intravenous Q12H   Continuous Infusions: . sodium chloride 10 mL/hr at 11/17/13 2000   Antibiotics Given (last 72 hours)   Date/Time Action Medication Dose Rate   11/17/13 0332 Given  [Just received]   vancomycin (VANCOCIN) 2,500 mg in sodium chloride 0.9 % 500 mL IVPB 2,500 mg 250 mL/hr   11/17/13 0759 Given   piperacillin-tazobactam (ZOSYN) IVPB 3.375 g 3.375 g 12.5 mL/hr   11/17/13 1501 Given   vancomycin (VANCOCIN) 1,500 mg in sodium chloride 0.9 % 500 mL IVPB 1,500 mg 250 mL/hr   11/17/13 1705 Given   piperacillin-tazobactam (ZOSYN) IVPB 3.375 g 3.375 g 12.5 mL/hr   11/18/13 0027 Given   piperacillin-tazobactam (ZOSYN) IVPB 3.375 g 3.375 g 12.5 mL/hr   11/18/13 0456 Given   vancomycin (VANCOCIN) 1,500 mg in sodium chloride 0.9 % 500 mL IVPB 1,500 mg 250 mL/hr   11/18/13 0859 Given   piperacillin-tazobactam (ZOSYN) IVPB 3.375 g 3.375 g 12.5 mL/hr      Principal Problem:   Cellulitis Active Problems:   Lapband APL Jan 2011   Diabetes mellitus, type II   Depression   OSA (obstructive sleep apnea)    Time spent: 35 min    Rogers Ditter, [facility] Pager [phone]. If 7PM-7AM, please contact night-coverage at [email or web address], password [facility] 11/18/2013, 10:15 AM  LOS: 2 days

## 2013-11-19 DIAGNOSIS — F329 Major depressive disorder, single episode, unspecified: Secondary | ICD-10-CM

## 2013-11-19 DIAGNOSIS — F3289 Other specified depressive episodes: Secondary | ICD-10-CM

## 2013-11-19 LAB — GLUCOSE, CAPILLARY
GLUCOSE-CAPILLARY: 95 mg/dL (ref 70–99)
Glucose-Capillary: 144 mg/dL — ABNORMAL HIGH (ref 70–99)

## 2013-11-19 MED ORDER — CLINDAMYCIN HCL 300 MG PO CAPS: 300.0000 mg | ORAL_CAPSULE | Freq: Three times a day (TID) | ORAL | Status: DC

## 2013-11-19 MED ORDER — GLIMEPIRIDE 4 MG PO TABS: 8.0000 mg | ORAL_TABLET | Freq: Every day | ORAL | Status: DC

## 2013-11-19 NOTE — Discharge Summary (Signed)
Physician Discharge Summary  Frank Cox EQA:[id number] DOB: Jul 13, 1961 DOA: 11/16/2013  PCP: Gara Kroner, MD  Admit date: 11/16/2013 Discharge date: 11/19/2013  Time spent: 35 minutes  Recommendations for Outpatient Follow-up:  1. Wound care referral  Discharge Diagnoses:  Principal Problem:   Cellulitis Active Problems:   Lapband APL Jan 2011   Diabetes mellitus, type II   Depression   OSA (obstructive sleep apnea)   Discharge Condition: improved  Diet recommendation: cardiac/diabetic  Filed Weights   11/17/13 2049 11/18/13 0500 11/19/13 0431  Weight: 159.167 kg (350 lb 14.4 oz) 157.761 kg (347 lb 12.8 oz) 158.26 kg (348 lb 14.4 oz)    History of present illness:  Frank Cox is a 53 y.o. male with Past medical history of diabetes, chronic venous insufficiency, hypertension, bipolar disorder on lithium, morbid obesity.  The patient is coming from home.  The patient presents with complaints of right leg redness and pain. He mentions that he had mechanical fall few days ago at which time he had injury to the shin of his right leg. He was putting a dry dressing and some ointment. Despite which he continues to have progressively worsening redness and swelling of his right leg associated with mild tingling and pain superficially. He has been able to ambulate and does not have any complaint of pain in his ankle or knee. He denies any fever or tiredness malaise joint pains.  He does not take any medications at present for diabetes since he was told that he was diabetes free, he was in the past was on metformin.  He also has been diagnosed with chronic venous insufficiency.  Pt denies any fever, chills, headache, cough, chest pain, palpitation, shortness of breath, orthopnea, PND, nausea, vomiting, abdominal pain, diarrhea, constipation, active bleeding, burning urination, dizziness, focal neurological deficit.    Hospital Course:  Cellulitis  - right leg redness  after an injury.  -legs improved  - X-ray of the leg is not showing any bone involvement.  -IV vancomycin and IV Zosyn- change to PO and d/c  Wound care follow up  Diabetes mellitus  SSI   History of depression and bipolar disorder  Continue Lamictal and lithium at home dose  Continue Celexa and clonazepam   Morbid obesity   Obstructive sleep apnea   Recommend diet and exercise at present on cardiac diet   Procedures:  none  Consultations:  none  Discharge Exam: Filed Vitals:   11/19/13 0658  BP: 146/84  Pulse: 59  Temp: 98.3 F (36.8 C)  Resp: 16    General: A+Ox3, NAD Cardiovascular: rrr Respiratory: clear anterior  Discharge Instructions      Discharge Orders   Future Orders Complete By Expires   Diet - low sodium heart healthy  As directed    Diet Carb Modified  As directed    Discharge instructions  As directed    Comments:     Follow up at wound care center Cleanse ulcer to right calf with NS and pat gently dry.  Apply Mepitel silicone contact layer to wound bed.  Top with 4x4 and kerlix.  Secure with tape. Change daily.   Increase activity slowly  As directed        Medication List         CALCIUM 600+D3 PO  Take 1 tablet by mouth daily.     citalopram 40 MG tablet  Commonly known as:  CELEXA  Take 40 mg by mouth daily.  clindamycin 300 MG capsule  Commonly known as:  CLEOCIN  Take 1 capsule (300 mg total) by mouth 3 (three) times daily.     clonazePAM 1 MG tablet  Commonly known as:  KLONOPIN  Take 1 mg by mouth at bedtime.     diphenhydrAMINE 25 MG tablet  Commonly known as:  BENADRYL  Take 50 mg by mouth every 6 (six) hours as needed for allergies.     glimepiride 4 MG tablet  Commonly known as:  AMARYL  Take 2 tablets (8 mg total) by mouth daily with breakfast.     lamoTRIgine 200 MG tablet  Commonly known as:  LAMICTAL  Take 200 mg by mouth at bedtime.     LATUDA 40 MG Tabs tablet  Generic drug:  lurasidone  Take  40 mg by mouth at bedtime.     lithium carbonate 450 MG CR tablet  Commonly known as:  ESKALITH  Take 1,800 mg by mouth at bedtime.     metoprolol succinate 25 MG 24 hr tablet  Commonly known as:  TOPROL-XL  Take 25 mg by mouth every morning.     MULTIVITAMIN PO  Take 1 tablet by mouth every morning. [employer] brand Solotron Chewable.       No Known Allergies    The results of significant diagnostics from this hospitalization (including imaging, microbiology, ancillary and laboratory) are listed below for reference.    Significant Diagnostic Studies: Dg Tibia/fibula Right  11/17/2013   CLINICAL DATA:  Worsening redness, swelling and pain associated with a mid right lower leg wound. Clinical concern for osteomyelitis. Diabetes.  EXAM: RIGHT TIBIA AND FIBULA - 2 VIEW  COMPARISON:  Right ankle CT dated 09/21/2007.  FINDINGS: Diffuse soft tissue swelling, most pronounced anterior to the proximal tibial shaft. Mild focal hyperostosis arising from the lateral aspect of the distal fibular shaft. No soft tissue gas, bone destruction or periosteal reaction suspicious for osteomyelitis. Mild right knee degenerative changes.  IMPRESSION: Soft tissue swelling without evidence of underlying osteomyelitis.   Electronically Signed   By: Enrique Sack M.D.   On: 11/17/2013 00:59    Microbiology: No results found for this or any previous visit (from the past 240 hour(s)).   Labs: Basic Metabolic Panel:  Recent Labs Lab 11/16/13 2018 11/17/13 0420  NA 140 145  K 4.1 4.3  CL 104 108  CO2 22 21  GLUCOSE 288* 235*  BUN 17 13  CREATININE 1.13 1.14  CALCIUM 10.8* 10.3   Liver Function Tests:  Recent Labs Lab 11/16/13 2018 11/17/13 0420  AST 36 33  ALT 54* 48  ALKPHOS 63 64  BILITOT 0.2* 0.5  PROT 7.4 7.2  ALBUMIN 3.7 3.7   No results found for this basename: LIPASE, AMYLASE,  in the last 168 hours No results found for this basename: AMMONIA,  in the last 168 hours CBC:  Recent  Labs Lab 11/16/13 2018 11/17/13 0420  WBC 10.2 10.9*  NEUTROABS 6.9  --   HGB 13.6 13.5  HCT 40.7 40.6  MCV 92.9 93.5  PLT 306 324   Cardiac Enzymes: No results found for this basename: CKTOTAL, CKMB, CKMBINDEX, TROPONINI,  in the last 168 hours BNP: BNP (last 3 results) No results found for this basename: PROBNP,  in the last 8760 hours CBG:  Recent Labs Lab 11/18/13 1123 11/18/13 1630 11/18/13 2131 11/19/13 0701 11/19/13 1128  GLUCAP 118* 98 125* 144* 95       Signed:  VANN, JESSICA  [facility] 11/19/2013, 2:03 PM

## 2013-11-19 NOTE — Progress Notes (Signed)
Utilization review completed.  

## 2013-12-05 ENCOUNTER — Encounter (HOSPITAL_BASED_OUTPATIENT_CLINIC_OR_DEPARTMENT_OTHER): Payer: 59 | Attending: General Surgery

## 2013-12-05 DIAGNOSIS — I10 Essential (primary) hypertension: Secondary | ICD-10-CM | POA: Insufficient documentation

## 2013-12-05 DIAGNOSIS — L97809 Non-pressure chronic ulcer of other part of unspecified lower leg with unspecified severity: Secondary | ICD-10-CM | POA: Insufficient documentation

## 2013-12-05 DIAGNOSIS — I872 Venous insufficiency (chronic) (peripheral): Secondary | ICD-10-CM | POA: Insufficient documentation

## 2014-02-14 DIAGNOSIS — C61 Malignant neoplasm of prostate: Secondary | ICD-10-CM

## 2014-02-14 HISTORY — PX: PROSTATE BIOPSY: SHX241

## 2014-02-14 HISTORY — DX: Malignant neoplasm of prostate: C61

## 2014-02-19 ENCOUNTER — Other Ambulatory Visit (HOSPITAL_COMMUNITY): Payer: Self-pay | Admitting: Urology

## 2014-02-19 DIAGNOSIS — C61 Malignant neoplasm of prostate: Secondary | ICD-10-CM

## 2014-03-01 ENCOUNTER — Ambulatory Visit (HOSPITAL_COMMUNITY)
Admission: RE | Admit: 2014-03-01 | Discharge: 2014-03-01 | Disposition: A | Discharge status: Home / Self Care | Payer: 59 | Source: Ambulatory Visit | Attending: Urology | Admitting: Urology

## 2014-03-01 ENCOUNTER — Encounter (HOSPITAL_COMMUNITY): Payer: Self-pay

## 2014-03-01 ENCOUNTER — Encounter (HOSPITAL_COMMUNITY)
Admission: RE | Admit: 2014-03-01 | Discharge: 2014-03-01 | Disposition: A | Discharge status: Home / Self Care | Payer: 59 | Source: Ambulatory Visit | Attending: Urology | Admitting: Urology

## 2014-03-01 DIAGNOSIS — C61 Malignant neoplasm of prostate: Secondary | ICD-10-CM | POA: Insufficient documentation

## 2014-03-01 DIAGNOSIS — R972 Elevated prostate specific antigen [PSA]: Secondary | ICD-10-CM | POA: Insufficient documentation

## 2014-03-01 HISTORY — DX: Malignant neoplasm of prostate: C61

## 2014-03-01 MED ORDER — TECHNETIUM TC 99M MEDRONATE IV KIT
25.0000 | PACK | Freq: Once | INTRAVENOUS | Status: AC | PRN
  Administered 2014-03-01: 25 via INTRAVENOUS

## 2014-03-18 ENCOUNTER — Encounter: Payer: Self-pay | Admitting: *Deleted

## 2014-03-18 NOTE — Progress Notes (Signed)
GU Location of Tumor / Histology: prostate adenocarcinoma  If Prostate Cancer, Gleason Score is (4 + 3) and PSA is (27.60 on 12/30/13)  Riccardo Dubin III presented 3 months ago with signs/symptoms of: DRE- 60 Gm smooth, progressive obstructive and irritative voiding symptoms, nocturia x 3-4  Biopsies of prostate (if applicable) revealed:  5/92/92  Volume 56 mL   Past/Anticipated interventions by urology, if any: Flomax, PSA surveillance  Past/Anticipated interventions by medical oncology, if any: no  Weight changes, if any: no  Bowel/Bladder complaints, if any: urinary frequency always, occasional urgency, weak stream, nocturia x 2,  IPSS 9  Nausea/Vomiting, if any: no  Pain issues, if any:  no  SAFETY ISSUES:  Prior radiation? no  Pacemaker/ICD? no  Possible current pregnancy? na  Is the patient on methotrexate? 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

## 2014-03-19 ENCOUNTER — Encounter: Payer: Self-pay | Admitting: Radiation Oncology

## 2014-03-19 ENCOUNTER — Ambulatory Visit
Admission: RE | Admit: 2014-03-19 | Discharge: 2014-03-19 | Disposition: A | Discharge status: Home / Self Care | Payer: 59 | Source: Ambulatory Visit | Attending: Radiation Oncology | Admitting: Radiation Oncology

## 2014-03-19 VITALS — BP 148/68 | HR 73 | Temp 97.7°F | Resp 20 | Ht 70.0 in | Wt 367.8 lb

## 2014-03-19 DIAGNOSIS — E669 Obesity, unspecified: Secondary | ICD-10-CM | POA: Diagnosis not present

## 2014-03-19 DIAGNOSIS — N529 Male erectile dysfunction, unspecified: Secondary | ICD-10-CM | POA: Insufficient documentation

## 2014-03-19 DIAGNOSIS — E119 Type 2 diabetes mellitus without complications: Secondary | ICD-10-CM | POA: Insufficient documentation

## 2014-03-19 DIAGNOSIS — Z510 Encounter for antineoplastic radiation therapy: Secondary | ICD-10-CM | POA: Insufficient documentation

## 2014-03-19 DIAGNOSIS — I10 Essential (primary) hypertension: Secondary | ICD-10-CM | POA: Diagnosis not present

## 2014-03-19 DIAGNOSIS — C61 Malignant neoplasm of prostate: Secondary | ICD-10-CM

## 2014-03-19 DIAGNOSIS — F319 Bipolar disorder, unspecified: Secondary | ICD-10-CM | POA: Insufficient documentation

## 2014-03-19 HISTORY — DX: Unspecified osteoarthritis, unspecified site: M19.90

## 2014-03-19 HISTORY — DX: Bipolar disorder, unspecified: F31.9

## 2014-03-19 HISTORY — DX: Hyperlipidemia, unspecified: E78.5

## 2014-03-19 NOTE — Progress Notes (Signed)
[facility] NEW PATIENT EVALUATION  Name: Frank Cox MRN: [id number]  Date:   03/19/2014           DOB: 28-Jan-1961  Status: outpatient   CC: Gara Kroner, MD  Alexis Frock, MD    REFERRING PHYSICIAN: Alexis Frock, MD   DIAGNOSIS: Stage TI C. high-risk adenocarcinoma prostate   HISTORY OF PRESENT ILLNESS:  Frank Cox is a 53 y.o. male who is seen today through the courtesy of Dr. Tresa Moore of for evaluation of his stage TI C. high-risk adenocarcinoma prostate. He presented to Dr. Tresa Moore with a recent history of worsening obstructive symptomatology. A PSA was elevated at 27.6. Therefore, he underwent ultrasound-guided biopsies on 02/14/2014 which revealed Gleason 7 (4+3) involving 20% of one core from the right lateral base. He also had Gleason 7 (3+4) involving 80% of one core from the right mid gland , 60% of one core from right apex and 20% of one core from left apex. He had Gleason 6 (3+3) involving 60% of one core from the right lateral mid gland. His gland volume was 56 cc. His urinary his obstructive symptomatology is improved on tamsulosin. His I PSS score today is 9. He does have erectile dysfunction. His staging workup included a CT scan of the abdomen/pelvis and bone scan which were without evidence for metastatic disease.  PREVIOUS RADIATION THERAPY: No   PAST MEDICAL HISTORY:  has a past medical history of Depression; Anxiety; CPAP (continuous positive airway pressure) dependence; Poor circulation; Hypertension; Fatigue; Diabetes mellitus; Kidney stone; Generalized headaches; Hearing loss; Contact lens/glasses fitting; Bruises easily; Obesity; Prostate cancer (02/14/14); Arthritis; Hyperlipidemia; and Bipolar 1 disorder.     PAST SURGICAL HISTORY:  Past Surgical History  Procedure Laterality Date  . Laparoscopic gastric banding  09/09/09  . Prostate biopsy  02/14/14    Gleason 7, volume 56 mL     FAMILY HISTORY: family history  includes Alcohol abuse in his mother; Arthritis in his father and mother. His father is alive and well at 64, and his mother is alive and well at 46. No family history of prostate cancer.   SOCIAL HISTORY:  reports that he has never smoked. He has never used smokeless tobacco. He reports that he does not drink alcohol or use illicit drugs. Never married, no children. He works for the city of [employer].   ALLERGIES: Review of patient's allergies indicates no known allergies.   MEDICATIONS:  Current Outpatient Prescriptions  Medication Sig Dispense Refill  . citalopram (CELEXA) 40 MG tablet Take 40 mg by mouth daily.        . clonazePAM (KLONOPIN) 1 MG tablet Take 1 mg by mouth at bedtime.       . diphenhydrAMINE (BENADRYL) 25 MG tablet Take 50 mg by mouth every 6 (six) hours as needed for allergies.      Marland Kitchen glimepiride (AMARYL) 4 MG tablet Take 2 tablets (8 mg total) by mouth daily with breakfast.  60 tablet  0  . lamoTRIgine (LAMICTAL) 200 MG tablet Take 200 mg by mouth at bedtime.       Marland Kitchen lithium carbonate (ESKALITH) 450 MG CR tablet Take 1,800 mg by mouth at bedtime.       Marland Kitchen lurasidone (LATUDA) 40 MG TABS Take 40 mg by mouth at bedtime.       . metoprolol succinate (TOPROL-XL) 25 MG 24 hr tablet Take 25 mg by mouth every morning.       Marland Kitchen  Multiple Vitamin (MULTIVITAMIN PO) Take 1 tablet by mouth every morning. [employer] brand Solotron Chewable.       No current facility-administered medications for this encounter.     REVIEW OF SYSTEMS:  Pertinent items are noted in HPI.    PHYSICAL EXAM:  height is 5\' 10"  (1.778 m) and weight is 367 lb 12.8 oz (166.833 kg). His oral temperature is 97.7 F (36.5 C). His blood pressure is 148/68 and his pulse is 73. His respiration is 20.   Alert and oriented 53 year old white male. Head and neck examination: Grossly unremarkable. Nodes: Without palpable cervical or supraclavicular lymphadenopathy. Chest: Lungs clear. Abdomen: Obese, I  do not feel his liver edge. Genitalia: Unremarkable to inspection. Rectal: I can palpate the mid to apical portion of his gland which is without focal induration or nodularity. Extremities: Venous stasis changes along the distal lower extremities.   LABORATORY DATA:  Lab Results  Component Value Date   WBC 10.9* 11/17/2013   HGB 13.5 11/17/2013   HCT 40.6 11/17/2013   MCV 93.5 11/17/2013   PLT 324 11/17/2013   Lab Results  Component Value Date   NA 145 11/17/2013   K 4.3 11/17/2013   CL 108 11/17/2013   CO2 21 11/17/2013   Lab Results  Component Value Date   ALT 48 11/17/2013   AST 33 11/17/2013   ALKPHOS 64 11/17/2013   BILITOT 0.5 11/17/2013   PSA 27.6 from 12/30/2013.   IMPRESSION: Stage TI C. high-risk adenocarcinoma prostate. I explained to the patient that his prognosis is related to his stage, PSA level, and Gleason score. His stage is favorable, his Gleason score of 7 is of intermediate favorability, and his PSA level of over 20 is unfavorable. He is in the high-risk category. We discussed surgery versus radiation therapy. Ordinarily, I would strongly recommend surgery for patient in his mid 49s, however his obesity and diabetes may complicate a surgical approach. His radiation therapy options include 5 weeks of external beam radiation therapy followed by seed implantation or 8 weeks of external beam/IMRT. We discussed the potential acute and late toxicities. His body habitus will probably allow for IMRT planning. We also discussed androgen deprivation therapy, ideally for 2 years, in conjunction with either radiation therapy option. His diabetes may be more difficult to control, but hopefully some degree of weight loss will improve his diabetic control. We discussed bladder filling to minimize urinary toxicity.   PLAN: The patient is leaning somewhat towards radiation therapy, and will think things over. He'll contact me if he would like to proceed with radiation therapy. We talked about  initial androgen deprivation therapy with simulation/treatment planning in 2-1/2-3 months along with a followup visit with me in 2 months. I would also need for Dr. Tresa Moore to place 3 gold markers for image guidance.  I spent 60 minutes minutes face to face with the patient and more than 50% of that time was spent in counseling and/or coordination of care.

## 2014-03-19 NOTE — Progress Notes (Signed)
Please see the Nurse Progress Note in the MD Initial Consult Encounter for this patient. 

## 2014-03-21 ENCOUNTER — Encounter: Payer: Self-pay | Admitting: Radiation Oncology

## 2014-03-21 NOTE — Progress Notes (Signed)
CC Dr. Phebe Colla  Mr. Boehringer called me and he wants to proceed with short-term androgen deprivation therapy and then 8 weeks of external beam/IMRT. He wants to avoid surgery. I will contact Dr. Zettie Pho nurse tomorrow and get him started on 6 months of androgen deprivation therapy. We will then get him scheduled for placement of 3 gold seed markers along with a followup visit with me in 2 months at which time we will schedule his CT simulation/treatment planning.

## 2014-03-22 ENCOUNTER — Telehealth: Payer: Self-pay | Admitting: *Deleted

## 2014-03-22 ENCOUNTER — Encounter: Payer: Self-pay | Admitting: *Deleted

## 2014-03-22 NOTE — Telephone Encounter (Signed)
CALLED PATIENT TO INFORM OF GOLD SEED PLACEMENT ON 04-15-14 - ARRIVAL TIME - 10 AM @ DR. MANNY'S OFFICE AND FNC VISIT FOR 05-22-14- ARRIVAL TIME - 3 PM @ DR. MURRAY'S OFFICE, SPOKE WITH PATIENT AND HE IS AWARE OF THESE APPTS.

## 2014-03-22 NOTE — Progress Notes (Signed)
[place] Psychosocial Distress Screening Clinical Social Work  Clinical Social Work was referred by distress screening protocol.  The patient scored a 6 on the Psychosocial Distress Thermometer which indicates moderate distress. Clinical Social Worker phoned to assess for distress and other psychosocial needs. Pt reports to have good support from extended family and close friends. He feels well prepared and positive about his upcoming treatment. He feels much more prepared after meeting with the MD the other day. He states his issues with work are related to his long work day of 13 hours. CSW discussed with pt options if assistance for work accomodation is needed. He felt this was not needed currently. CSW also discussed Prostate Cancer Support Group and Support Team. Pt interested in both and will reach out/attend. Pt agrees to phone CSW as needed.   ONCBCN DISTRESS SCREENING 03/19/2014  Screening Type Initial Screening  Frank Cox the number that describes how much distress you have been experiencing in the past week 6  Practical problem type Work/school  Emotional problem type Nervousness/Anxiety;Adjusting to illness  Information Concerns Type Lack of info about treatment  Physical Problem type Getting around;Bathing/dressing;Changes in urination;Skin dry/itchy;Swollen arms/legs  Other most distressing listed is "my illness", contact by phone    Clinical Social Worker follow up needed: Yes.    If yes, follow up plan: See above [name], [place] Worker Doris S. Pavo for [place] Wednesday, Thursday and Friday Phone: [phone] Fax: [phone]

## 2014-04-01 ENCOUNTER — Encounter: Payer: Self-pay | Admitting: Radiation Oncology

## 2014-04-01 NOTE — Progress Notes (Signed)
Chart note: I spoke with DrZettie Pho nurse , Glenard Haring, on July 17 and she was asked to begin androgen deprivation therapy for a total of 6 months.

## 2014-05-20 NOTE — Progress Notes (Signed)
GU Location of Tumor / Histology: prostate adenocarcinoma   If Prostate Cancer, Gleason Score is (4 + 3) and PSA is (27.60 on 12/30/13)   Frank Cox presented 3 months ago with signs/symptoms of: DRE- 60 Gm smooth, progressive obstructive and irritative voiding symptoms, nocturia x 3-4   Biopsies of prostate (if applicable) revealed:  05/01/99 Volume 56 mL   Past/Anticipated interventions by urology, if any: Flomax, PSA surveillance   Past/Anticipated interventions by medical oncology, if any: no   Weight changes, if any: no   Bowel/Bladder complaints, if any:  IPSS 6, frequency, nocturia x 2, on Flomax so issues improved  Nausea/Vomiting, if any: no   Pain issues, if any: no   SAFETY ISSUES:  Prior radiation? no  Pacemaker/ICD? no  Possible current pregnancy? na  Is the patient on methotrexate? 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 04/15/14   3 gold seeds placed; androgen deprivation started, every 6 months. Last dose of Lupron 45 mg on 04/02/14.

## 2014-05-22 ENCOUNTER — Ambulatory Visit
Admission: RE | Admit: 2014-05-22 | Discharge: 2014-05-22 | Disposition: A | Discharge status: Home / Self Care | Payer: 59 | Source: Ambulatory Visit | Attending: Radiation Oncology | Admitting: Radiation Oncology

## 2014-05-22 ENCOUNTER — Encounter: Payer: Self-pay | Admitting: Radiation Oncology

## 2014-05-22 VITALS — BP 139/67 | HR 75 | Temp 98.2°F | Resp 20 | Ht 70.0 in | Wt 367.6 lb

## 2014-05-22 DIAGNOSIS — Z510 Encounter for antineoplastic radiation therapy: Secondary | ICD-10-CM | POA: Diagnosis present

## 2014-05-22 DIAGNOSIS — N529 Male erectile dysfunction, unspecified: Secondary | ICD-10-CM | POA: Insufficient documentation

## 2014-05-22 DIAGNOSIS — C61 Malignant neoplasm of prostate: Secondary | ICD-10-CM | POA: Insufficient documentation

## 2014-05-22 NOTE — Progress Notes (Signed)
Cc:  Dr. Phebe Colla, Dr. Antony Contras  Followup note:  Frank Cox returns today for review and scheduling of his external beam/IMRT in the management of his stage TI C. high risk adenocarcinoma prostate.  He presented to Dr. Tresa Moore with a recent history of worsening obstructive symptomatology. A PSA was elevated at 27.6. Therefore, he underwent ultrasound-guided biopsies on 02/14/2014 which revealed Gleason 7 (4+3) involving 20% of one core from the right lateral base. He also had Gleason 7 (3+4) involving 80% of one core from the right mid gland , 60% of one core from right apex and 20% of one core from left apex. He had Gleason 6 (3+3) involving 60% of one core from the right lateral mid gland. His gland volume was 56 cc. His urinary his obstructive symptomatology is improved on tamsulosin. His I PSS score today is 6. He does have erectile dysfunction. His staging workup included a CT scan of the abdomen/pelvis and bone scan which were without evidence for metastatic disease. He decided on external beam/IMRT along with androgen deprivation therapy. He began androgen deprivation therapy with Depo-Lupron six-month given on 04/15/2014. He had gold seeds placed the same time.  Physical examination: Alert and oriented. Filed Vitals:   05/22/14 1508  BP: 139/67  Pulse: 75  Temp: 98.2 F (36.8 C)  Resp: 20   Rectal examination not performed today.  Impression: Stage TI C. high risk adenocarcinoma prostate. I will have him return for CT simulation on October 19 and begin his radiation therapy in late October. We should finish by the end of the year. We again discussed the potential acute and late toxicities of radiation therapy and consent is signed today. We also discussed planning and treating him with a comfortably full bladder to minimize urinary toxicity.  Plan: As above .  30 minutes was spent face-to-face with the patient, primarily counseling the patient and coordinating his care.

## 2014-05-22 NOTE — Progress Notes (Signed)
Please see the Nurse Progress Note in the MD Initial Consult Encounter for this patient. 

## 2014-05-22 NOTE — Progress Notes (Signed)
Forwarded FMLA request to RN for Dr. Valere Dross - patient coming in for follow up new visit 05/22/14

## 2014-05-23 ENCOUNTER — Encounter: Payer: Self-pay | Admitting: Radiation Oncology

## 2014-05-23 NOTE — Progress Notes (Signed)
FMLA paperwork rec'd from physician.  Faxed FMLA paperwork to [employer], attn Barlow [phone]) and made copy for scanning.  Mailed original paperwork back to patient.

## 2014-06-24 ENCOUNTER — Ambulatory Visit
Admission: RE | Admit: 2014-06-24 | Discharge: 2014-06-24 | Disposition: A | Discharge status: Home / Self Care | Payer: 59 | Source: Ambulatory Visit | Attending: Radiation Oncology | Admitting: Radiation Oncology

## 2014-06-24 DIAGNOSIS — C61 Malignant neoplasm of prostate: Secondary | ICD-10-CM | POA: Diagnosis not present

## 2014-06-24 DIAGNOSIS — Z510 Encounter for antineoplastic radiation therapy: Secondary | ICD-10-CM | POA: Diagnosis not present

## 2014-06-24 NOTE — Progress Notes (Signed)
Complex simulation/treatment planning note: Frank Cox was taken to the CT simulator. He was placed supine. A Vac lock immobilization device was constructed. I placed a red rubber tube within the rectal vault. He was then catheterized to instill contrast into the bladder and urethra. He was then scanned. I chose an isocenter along the central prostate. The CT data set was sent to the MIM planning system I contoured his prostate, seminal vesicles, rectum, bladder, and lower rectosigmoid colon. I am prescribing 7800 cGy in 40 sessions to his prostate PTV which represents the prostate plus 0.8 cm except for 0.5 cm along the rectum. I prescribing 5600 cGy to his seminal vesicle PTV which are presents his seminal vesicles plus 0.5 cm. He is now ready for IMRT simulation/treatment planning.

## 2014-06-25 ENCOUNTER — Encounter: Payer: Self-pay | Admitting: Radiation Oncology

## 2014-06-25 DIAGNOSIS — Z510 Encounter for antineoplastic radiation therapy: Secondary | ICD-10-CM | POA: Diagnosis not present

## 2014-06-25 NOTE — Progress Notes (Signed)
IMRT simulation/treatment planning note: The patient completed his IMRT simulation/treatment planning today in the management of his carcinoma the prostate. IMRT was chosen to decrease the risk for both acute and late bladder and rectal toxicity compared to conformal or three-dimensional conformal radiation therapy. Dose volume histograms were obtained for the target structures including the prostate and seminal vesicles along with avoidance structures including the rectum, bladder, and femoral heads. We met our departmental guidelines. I prescribing 7800 cGy in 40 sessions to his prostate PTV and 5600 cGy in 40 sessions to his seminal vesicle PTV. He is to be treated with a comfortably full bladder.

## 2014-07-01 DIAGNOSIS — Z510 Encounter for antineoplastic radiation therapy: Secondary | ICD-10-CM | POA: Diagnosis not present

## 2014-07-03 ENCOUNTER — Ambulatory Visit
Admission: RE | Admit: 2014-07-03 | Discharge: 2014-07-03 | Disposition: A | Discharge status: Home / Self Care | Payer: 59 | Source: Ambulatory Visit | Attending: Radiation Oncology | Admitting: Radiation Oncology

## 2014-07-03 DIAGNOSIS — Z510 Encounter for antineoplastic radiation therapy: Secondary | ICD-10-CM | POA: Diagnosis not present

## 2014-07-04 ENCOUNTER — Ambulatory Visit
Admission: RE | Admit: 2014-07-04 | Discharge: 2014-07-04 | Disposition: A | Discharge status: Home / Self Care | Payer: 59 | Source: Ambulatory Visit | Attending: Radiation Oncology | Admitting: Radiation Oncology

## 2014-07-04 DIAGNOSIS — Z510 Encounter for antineoplastic radiation therapy: Secondary | ICD-10-CM | POA: Diagnosis not present

## 2014-07-05 ENCOUNTER — Ambulatory Visit
Admission: RE | Admit: 2014-07-05 | Discharge: 2014-07-05 | Disposition: A | Discharge status: Home / Self Care | Payer: 59 | Source: Ambulatory Visit | Attending: Radiation Oncology | Admitting: Radiation Oncology

## 2014-07-05 DIAGNOSIS — Z510 Encounter for antineoplastic radiation therapy: Secondary | ICD-10-CM | POA: Diagnosis not present

## 2014-07-08 ENCOUNTER — Encounter: Payer: Self-pay | Admitting: Radiation Oncology

## 2014-07-08 ENCOUNTER — Inpatient Hospital Stay
Admission: RE | Admit: 2014-07-08 | Discharge: 2014-07-08 | Disposition: A | Discharge status: Home / Self Care | Payer: Self-pay | Source: Ambulatory Visit | Attending: Radiation Oncology | Admitting: Radiation Oncology

## 2014-07-08 ENCOUNTER — Ambulatory Visit
Admission: RE | Admit: 2014-07-08 | Discharge: 2014-07-08 | Disposition: A | Discharge status: Home / Self Care | Payer: 59 | Source: Ambulatory Visit | Attending: Radiation Oncology | Admitting: Radiation Oncology

## 2014-07-08 VITALS — BP 138/74 | HR 61 | Temp 98.1°F | Resp 20 | Wt 364.9 lb

## 2014-07-08 DIAGNOSIS — C61 Malignant neoplasm of prostate: Secondary | ICD-10-CM

## 2014-07-08 DIAGNOSIS — Z510 Encounter for antineoplastic radiation therapy: Secondary | ICD-10-CM | POA: Diagnosis not present

## 2014-07-08 NOTE — Progress Notes (Signed)
Patient education completed with patient and wife. Gave pt "Radiation and You" booklet with all pertinent information marked and discussed, re: rectal discomfort/care, fatigue, urinary/bladder irritation/management, nutrition, pain. All questions answered. Teach back used. Patient denies pain, fatigue, loss of appetite, urinary/bowel issues.

## 2014-07-08 NOTE — Progress Notes (Signed)
Weekly Management Note:  Site:  Prostate Current Dose:  780  cGy Projected Dose: 7800  cGy  Narrative: The patient is seen today for routine under treatment assessment. CBCT/MVCT images/port films were reviewed. The chart was reviewed.   Bladder filling is satisfactory. No new GU or GI difficulties.  Physical Examination:  Filed Vitals:   07/08/14 1608  BP: 138/74  Pulse: 61  Temp: 98.1 F (36.7 C)  Resp: 20  .  Weight: 364 lb 14.4 oz (165.518 kg). No change.  Impression: Tolerating radiation therapy well.  Plan: Continue radiation therapy as planned.

## 2014-07-09 ENCOUNTER — Ambulatory Visit
Admission: RE | Admit: 2014-07-09 | Discharge: 2014-07-09 | Disposition: A | Discharge status: Home / Self Care | Payer: 59 | Source: Ambulatory Visit | Attending: Radiation Oncology | Admitting: Radiation Oncology

## 2014-07-09 DIAGNOSIS — Z510 Encounter for antineoplastic radiation therapy: Secondary | ICD-10-CM | POA: Diagnosis not present

## 2014-07-10 ENCOUNTER — Ambulatory Visit
Admission: RE | Admit: 2014-07-10 | Discharge: 2014-07-10 | Disposition: A | Discharge status: Home / Self Care | Payer: 59 | Source: Ambulatory Visit | Attending: Radiation Oncology | Admitting: Radiation Oncology

## 2014-07-10 DIAGNOSIS — Z510 Encounter for antineoplastic radiation therapy: Secondary | ICD-10-CM | POA: Diagnosis not present

## 2014-07-11 ENCOUNTER — Ambulatory Visit
Admission: RE | Admit: 2014-07-11 | Discharge: 2014-07-11 | Disposition: A | Discharge status: Home / Self Care | Payer: 59 | Source: Ambulatory Visit | Attending: Radiation Oncology | Admitting: Radiation Oncology

## 2014-07-11 DIAGNOSIS — Z510 Encounter for antineoplastic radiation therapy: Secondary | ICD-10-CM | POA: Diagnosis not present

## 2014-07-12 ENCOUNTER — Ambulatory Visit
Admission: RE | Admit: 2014-07-12 | Discharge: 2014-07-12 | Disposition: A | Discharge status: Home / Self Care | Payer: 59 | Source: Ambulatory Visit | Attending: Radiation Oncology | Admitting: Radiation Oncology

## 2014-07-12 DIAGNOSIS — Z510 Encounter for antineoplastic radiation therapy: Secondary | ICD-10-CM | POA: Diagnosis not present

## 2014-07-15 ENCOUNTER — Ambulatory Visit
Admission: RE | Admit: 2014-07-15 | Discharge: 2014-07-15 | Disposition: A | Discharge status: Home / Self Care | Payer: 59 | Source: Ambulatory Visit | Attending: Radiation Oncology | Admitting: Radiation Oncology

## 2014-07-15 ENCOUNTER — Encounter: Payer: Self-pay | Admitting: Radiation Oncology

## 2014-07-15 VITALS — BP 130/59 | HR 60 | Temp 98.3°F | Resp 10 | Wt 366.5 lb

## 2014-07-15 DIAGNOSIS — Z510 Encounter for antineoplastic radiation therapy: Secondary | ICD-10-CM | POA: Diagnosis not present

## 2014-07-15 DIAGNOSIS — C61 Malignant neoplasm of prostate: Secondary | ICD-10-CM

## 2014-07-15 NOTE — Progress Notes (Signed)
He is currently in no pain. Pt complains of, Loss of Sleep and Fatigue.  Reports urinary frequency and Pain with Urination.  Pt states they urinate 2 - 3 times per night.  Pt reports, a bowel movement every day. The patient eats a regular, healthy diet.Frank Cox

## 2014-07-15 NOTE — Progress Notes (Signed)
   Weekly Management Note:  outpatient    ICD-9-CM ICD-10-CM   1. Malignant neoplasm of prostate 185 C61     Current Dose:  17.55 Gy  Projected Dose: 78 Gy   Narrative:  The patient presents for routine under treatment assessment.  CBCT/MVCT images/Port film x-rays were reviewed.  The chart was checked. Some modest pain at end of penis when urinating. Some frequency. No fevers.  Physical Findings:  weight is 366 lb 8 oz (166.243 kg). His oral temperature is 98.3 F (36.8 C). His blood pressure is 130/59 and his pulse is 60. His respiration is 10 and oxygen saturation is 97%.  NAD  Impression:  The patient is tolerating radiotherapy.  Plan:  Continue radiotherapy as planned. Does not seem to have a UTI based on sx.  Will keep [place] up to date on how he is felling.  ________________________________   Eppie Gibson, M.D.

## 2014-07-16 ENCOUNTER — Ambulatory Visit
Admission: RE | Admit: 2014-07-16 | Discharge: 2014-07-16 | Disposition: A | Discharge status: Home / Self Care | Payer: 59 | Source: Ambulatory Visit | Attending: Radiation Oncology | Admitting: Radiation Oncology

## 2014-07-16 DIAGNOSIS — Z510 Encounter for antineoplastic radiation therapy: Secondary | ICD-10-CM | POA: Diagnosis not present

## 2014-07-17 ENCOUNTER — Ambulatory Visit
Admission: RE | Admit: 2014-07-17 | Discharge: 2014-07-17 | Disposition: A | Discharge status: Home / Self Care | Payer: 59 | Source: Ambulatory Visit | Attending: Radiation Oncology | Admitting: Radiation Oncology

## 2014-07-17 DIAGNOSIS — Z510 Encounter for antineoplastic radiation therapy: Secondary | ICD-10-CM | POA: Diagnosis not present

## 2014-07-18 ENCOUNTER — Ambulatory Visit
Admission: RE | Admit: 2014-07-18 | Discharge: 2014-07-18 | Disposition: A | Discharge status: Home / Self Care | Payer: 59 | Source: Ambulatory Visit | Attending: Radiation Oncology | Admitting: Radiation Oncology

## 2014-07-18 DIAGNOSIS — Z510 Encounter for antineoplastic radiation therapy: Secondary | ICD-10-CM | POA: Diagnosis not present

## 2014-07-19 ENCOUNTER — Ambulatory Visit
Admission: RE | Admit: 2014-07-19 | Discharge: 2014-07-19 | Disposition: A | Discharge status: Home / Self Care | Payer: 59 | Source: Ambulatory Visit | Attending: Radiation Oncology | Admitting: Radiation Oncology

## 2014-07-19 DIAGNOSIS — Z510 Encounter for antineoplastic radiation therapy: Secondary | ICD-10-CM | POA: Diagnosis not present

## 2014-07-22 ENCOUNTER — Ambulatory Visit
Admission: RE | Admit: 2014-07-22 | Discharge: 2014-07-22 | Disposition: A | Discharge status: Home / Self Care | Payer: 59 | Source: Ambulatory Visit | Attending: Radiation Oncology | Admitting: Radiation Oncology

## 2014-07-22 ENCOUNTER — Encounter: Payer: Self-pay | Admitting: Radiation Oncology

## 2014-07-22 VITALS — BP 145/81 | HR 60 | Temp 98.1°F | Resp 20 | Wt 367.0 lb

## 2014-07-22 DIAGNOSIS — C61 Malignant neoplasm of prostate: Secondary | ICD-10-CM

## 2014-07-22 DIAGNOSIS — Z510 Encounter for antineoplastic radiation therapy: Secondary | ICD-10-CM | POA: Diagnosis not present

## 2014-07-22 NOTE — Progress Notes (Signed)
Weekly Management Note:  Site:prostate Current Dose:  2730  cGy Projected Dose: 7800  cGy  Narrative: The patient is seen today for routine under treatment assessment. CBCT/MVCT images/port films were reviewed. The chart was reviewed.   Bladder filling is excellent. No new GU or GI difficulty. He did have referred discomfort to his distal penis last week and this has resolved.  Physical Examination:  Filed Vitals:   07/22/14 1447  BP: 145/81  Pulse: 60  Temp: 98.1 F (36.7 C)  Resp: 20  .  Weight: 367 lb (166.47 kg). No change.  Impression: Tolerating radiation therapy well.  Plan: Continue radiation therapy as planned.

## 2014-07-22 NOTE — Progress Notes (Signed)
Patient denies pain, fatigue, loss of appetite, bowel issues. He states the pain he had with urination last week has gone away.

## 2014-07-23 ENCOUNTER — Ambulatory Visit
Admission: RE | Admit: 2014-07-23 | Discharge: 2014-07-23 | Disposition: A | Discharge status: Home / Self Care | Payer: 59 | Source: Ambulatory Visit | Attending: Radiation Oncology | Admitting: Radiation Oncology

## 2014-07-23 DIAGNOSIS — Z510 Encounter for antineoplastic radiation therapy: Secondary | ICD-10-CM | POA: Diagnosis not present

## 2014-07-24 ENCOUNTER — Ambulatory Visit
Admission: RE | Admit: 2014-07-24 | Discharge: 2014-07-24 | Disposition: A | Discharge status: Home / Self Care | Payer: 59 | Source: Ambulatory Visit | Attending: Radiation Oncology | Admitting: Radiation Oncology

## 2014-07-24 DIAGNOSIS — Z510 Encounter for antineoplastic radiation therapy: Secondary | ICD-10-CM | POA: Diagnosis not present

## 2014-07-25 ENCOUNTER — Ambulatory Visit
Admission: RE | Admit: 2014-07-25 | Discharge: 2014-07-25 | Disposition: A | Discharge status: Home / Self Care | Payer: 59 | Source: Ambulatory Visit | Attending: Radiation Oncology | Admitting: Radiation Oncology

## 2014-07-25 DIAGNOSIS — Z510 Encounter for antineoplastic radiation therapy: Secondary | ICD-10-CM | POA: Diagnosis not present

## 2014-07-26 ENCOUNTER — Ambulatory Visit
Admission: RE | Admit: 2014-07-26 | Discharge: 2014-07-26 | Disposition: A | Discharge status: Home / Self Care | Payer: 59 | Source: Ambulatory Visit | Attending: Radiation Oncology | Admitting: Radiation Oncology

## 2014-07-26 DIAGNOSIS — Z510 Encounter for antineoplastic radiation therapy: Secondary | ICD-10-CM | POA: Diagnosis not present

## 2014-07-28 ENCOUNTER — Ambulatory Visit
Admission: RE | Admit: 2014-07-28 | Discharge: 2014-07-28 | Disposition: A | Discharge status: Home / Self Care | Payer: 59 | Source: Ambulatory Visit | Attending: Radiation Oncology | Admitting: Radiation Oncology

## 2014-07-28 DIAGNOSIS — Z510 Encounter for antineoplastic radiation therapy: Secondary | ICD-10-CM | POA: Diagnosis not present

## 2014-07-29 ENCOUNTER — Ambulatory Visit
Admission: RE | Admit: 2014-07-29 | Discharge: 2014-07-29 | Disposition: A | Discharge status: Home / Self Care | Payer: 59 | Source: Ambulatory Visit | Attending: Radiation Oncology | Admitting: Radiation Oncology

## 2014-07-29 ENCOUNTER — Encounter: Payer: Self-pay | Admitting: Radiation Oncology

## 2014-07-29 VITALS — BP 150/72 | HR 71 | Temp 98.1°F | Resp 20 | Wt 368.2 lb

## 2014-07-29 DIAGNOSIS — C61 Malignant neoplasm of prostate: Secondary | ICD-10-CM

## 2014-07-29 DIAGNOSIS — Z510 Encounter for antineoplastic radiation therapy: Secondary | ICD-10-CM | POA: Diagnosis not present

## 2014-07-29 NOTE — Progress Notes (Signed)
Weekly Management Note:  Site: Prostate Current Dose:  3900  cGy Projected Dose: 7800  cGy  Narrative: The patient is seen today for routine under treatment assessment. CBCT/MVCT images/port films were reviewed. The chart was reviewed.   Bladder filling remains excellent.  No GU or GI difficulty.  He does have mild to moderate fatigue.  Physical Examination:  Filed Vitals:   07/29/14 1524  BP: 150/72  Pulse: 71  Temp: 98.1 F (36.7 C)  Resp: 20  .  Weight: 368 lb 3.2 oz (167.014 kg).  No change.  Impression: Tolerating radiation therapy well.  Plan: Continue radiation therapy as planned.

## 2014-07-29 NOTE — Progress Notes (Signed)
Patient denies pain, loss of appetite, bowel issues. He states he is fatigued. He reports urinary frequency and states he does not feel he is emptying his bladder. He denies dysuria, straining. He states if he drinks just water it helps. He has nocturia x 2-3.

## 2014-07-30 ENCOUNTER — Ambulatory Visit
Admission: RE | Admit: 2014-07-30 | Discharge: 2014-07-30 | Disposition: A | Discharge status: Home / Self Care | Payer: 59 | Source: Ambulatory Visit | Attending: Radiation Oncology | Admitting: Radiation Oncology

## 2014-07-30 DIAGNOSIS — Z510 Encounter for antineoplastic radiation therapy: Secondary | ICD-10-CM | POA: Diagnosis not present

## 2014-07-31 ENCOUNTER — Ambulatory Visit
Admission: RE | Admit: 2014-07-31 | Discharge: 2014-07-31 | Disposition: A | Discharge status: Home / Self Care | Payer: 59 | Source: Ambulatory Visit | Attending: Radiation Oncology | Admitting: Radiation Oncology

## 2014-07-31 DIAGNOSIS — Z510 Encounter for antineoplastic radiation therapy: Secondary | ICD-10-CM | POA: Diagnosis not present

## 2014-08-02 ENCOUNTER — Ambulatory Visit: Payer: 59

## 2014-08-05 ENCOUNTER — Ambulatory Visit
Admission: RE | Admit: 2014-08-05 | Discharge: 2014-08-05 | Disposition: A | Discharge status: Home / Self Care | Payer: 59 | Source: Ambulatory Visit | Attending: Radiation Oncology | Admitting: Radiation Oncology

## 2014-08-05 ENCOUNTER — Encounter: Payer: Self-pay | Admitting: Radiation Oncology

## 2014-08-05 VITALS — BP 138/75 | HR 70 | Temp 97.9°F | Resp 20 | Wt 368.1 lb

## 2014-08-05 DIAGNOSIS — C61 Malignant neoplasm of prostate: Secondary | ICD-10-CM

## 2014-08-05 DIAGNOSIS — Z510 Encounter for antineoplastic radiation therapy: Secondary | ICD-10-CM | POA: Diagnosis not present

## 2014-08-05 NOTE — Progress Notes (Signed)
Patient denies pain, urinary/bowel issues, loss of appetite. He does have some fatigue.

## 2014-08-05 NOTE — Progress Notes (Signed)
Weekly Management Note:  Site: Prostate Current Dose:  4485  cGy Projected Dose: 7800  cGy  Narrative: The patient is seen today for routine under treatment assessment. CBCT/MVCT images/port films were reviewed. The chart was reviewed.   Bladder filling remains excellent.  No new GU or GI difficulties.  Physical Examination:  Filed Vitals:   08/05/14 1511  BP: 138/75  Pulse: 70  Temp: 97.9 F (36.6 C)  Resp: 20  .  Weight: 368 lb 1.6 oz (166.969 kg).  No change.  Impression: Tolerating radiation therapy well.  Plan: Continue radiation therapy as planned.

## 2014-08-06 ENCOUNTER — Ambulatory Visit
Admission: RE | Admit: 2014-08-06 | Discharge: 2014-08-06 | Disposition: A | Discharge status: Home / Self Care | Payer: 59 | Source: Ambulatory Visit | Attending: Radiation Oncology | Admitting: Radiation Oncology

## 2014-08-06 DIAGNOSIS — Z510 Encounter for antineoplastic radiation therapy: Secondary | ICD-10-CM | POA: Diagnosis not present

## 2014-08-07 ENCOUNTER — Ambulatory Visit
Admission: RE | Admit: 2014-08-07 | Discharge: 2014-08-07 | Disposition: A | Discharge status: Home / Self Care | Payer: 59 | Source: Ambulatory Visit | Attending: Radiation Oncology | Admitting: Radiation Oncology

## 2014-08-07 DIAGNOSIS — Z510 Encounter for antineoplastic radiation therapy: Secondary | ICD-10-CM | POA: Diagnosis not present

## 2014-08-08 ENCOUNTER — Ambulatory Visit
Admission: RE | Admit: 2014-08-08 | Discharge: 2014-08-08 | Disposition: A | Discharge status: Home / Self Care | Payer: 59 | Source: Ambulatory Visit | Attending: Radiation Oncology | Admitting: Radiation Oncology

## 2014-08-08 DIAGNOSIS — Z510 Encounter for antineoplastic radiation therapy: Secondary | ICD-10-CM | POA: Diagnosis not present

## 2014-08-09 ENCOUNTER — Ambulatory Visit
Admission: RE | Admit: 2014-08-09 | Discharge: 2014-08-09 | Disposition: A | Discharge status: Home / Self Care | Payer: 59 | Source: Ambulatory Visit | Attending: Radiation Oncology | Admitting: Radiation Oncology

## 2014-08-09 DIAGNOSIS — Z510 Encounter for antineoplastic radiation therapy: Secondary | ICD-10-CM | POA: Diagnosis not present

## 2014-08-12 ENCOUNTER — Ambulatory Visit
Admission: RE | Admit: 2014-08-12 | Discharge: 2014-08-12 | Disposition: A | Discharge status: Home / Self Care | Payer: 59 | Source: Ambulatory Visit | Attending: Radiation Oncology | Admitting: Radiation Oncology

## 2014-08-12 ENCOUNTER — Encounter: Payer: Self-pay | Admitting: Radiation Oncology

## 2014-08-12 VITALS — BP 152/78 | HR 85 | Temp 98.0°F | Resp 20 | Wt 370.1 lb

## 2014-08-12 DIAGNOSIS — Z510 Encounter for antineoplastic radiation therapy: Secondary | ICD-10-CM | POA: Diagnosis not present

## 2014-08-12 DIAGNOSIS — C61 Malignant neoplasm of prostate: Secondary | ICD-10-CM

## 2014-08-12 NOTE — Progress Notes (Signed)
Patient denies pain, urinary/bowel issues, fatigue, loss of appetite. 

## 2014-08-12 NOTE — Progress Notes (Signed)
Weekly Management Note:  Site: Prostate Current Dose:  5460  cGy Projected Dose: 7800  cGy  Narrative: The patient is seen today for routine under treatment assessment. CBCT/MVCT images/port films were reviewed. The chart was reviewed.   Bladder filling remains excellent.  No new GU or GI difficulties.  Physical Examination:  Filed Vitals:   08/12/14 1513  BP: 152/78  Pulse: 85  Temp: 98 F (36.7 C)  Resp: 20  .  Weight: 370 lb 1.6 oz (167.876 kg).  No change.  Impression: Tolerating radiation therapy well.  Plan: Continue radiation therapy as planned.

## 2014-08-13 ENCOUNTER — Ambulatory Visit
Admission: RE | Admit: 2014-08-13 | Discharge: 2014-08-13 | Disposition: A | Discharge status: Home / Self Care | Payer: 59 | Source: Ambulatory Visit | Attending: Radiation Oncology | Admitting: Radiation Oncology

## 2014-08-13 DIAGNOSIS — Z510 Encounter for antineoplastic radiation therapy: Secondary | ICD-10-CM | POA: Diagnosis not present

## 2014-08-14 ENCOUNTER — Ambulatory Visit
Admission: RE | Admit: 2014-08-14 | Discharge: 2014-08-14 | Disposition: A | Discharge status: Home / Self Care | Payer: 59 | Source: Ambulatory Visit | Attending: Radiation Oncology | Admitting: Radiation Oncology

## 2014-08-14 DIAGNOSIS — Z510 Encounter for antineoplastic radiation therapy: Secondary | ICD-10-CM | POA: Diagnosis not present

## 2014-08-15 ENCOUNTER — Ambulatory Visit
Admission: RE | Admit: 2014-08-15 | Discharge: 2014-08-15 | Disposition: A | Discharge status: Home / Self Care | Payer: 59 | Source: Ambulatory Visit | Attending: Radiation Oncology | Admitting: Radiation Oncology

## 2014-08-15 DIAGNOSIS — Z510 Encounter for antineoplastic radiation therapy: Secondary | ICD-10-CM | POA: Diagnosis not present

## 2014-08-16 ENCOUNTER — Ambulatory Visit
Admission: RE | Admit: 2014-08-16 | Discharge: 2014-08-16 | Disposition: A | Discharge status: Home / Self Care | Payer: 59 | Source: Ambulatory Visit | Attending: Radiation Oncology | Admitting: Radiation Oncology

## 2014-08-16 DIAGNOSIS — Z510 Encounter for antineoplastic radiation therapy: Secondary | ICD-10-CM | POA: Diagnosis not present

## 2014-08-19 ENCOUNTER — Encounter: Payer: Self-pay | Admitting: Radiation Oncology

## 2014-08-19 ENCOUNTER — Ambulatory Visit
Admission: RE | Admit: 2014-08-19 | Discharge: 2014-08-19 | Disposition: A | Discharge status: Home / Self Care | Payer: 59 | Source: Ambulatory Visit | Attending: Radiation Oncology | Admitting: Radiation Oncology

## 2014-08-19 VITALS — BP 159/73 | HR 72 | Temp 97.9°F | Resp 20 | Wt 370.4 lb

## 2014-08-19 DIAGNOSIS — C61 Malignant neoplasm of prostate: Secondary | ICD-10-CM

## 2014-08-19 DIAGNOSIS — Z510 Encounter for antineoplastic radiation therapy: Secondary | ICD-10-CM | POA: Diagnosis not present

## 2014-08-19 NOTE — Progress Notes (Signed)
Patient denies pain, loss of appetite, urinary/bowel issues. He continues to have slight fatigue.

## 2014-08-19 NOTE — Progress Notes (Signed)
Weekly Management Note:  Site: prostate Current Dose:   6435  cGy Projected Dose:  7800  cGy  Narrative: The patient is seen today for routine under treatment assessment. CBCT/MVCT images/port films were reviewed. The chart was reviewed.    Bladder filling is excellent. No new GU or GI difficulty.  Physical Examination:  Filed Vitals:   08/19/14 1503  BP: 159/73  Pulse: 72  Temp: 97.9 F (36.6 C)  Resp: 20  .  Weight: 370 lb 6.4 oz (168.012 kg).  No change.  Impression: Tolerating radiation therapy well.  He will finish his radiation therapy next.  Plan: Continue radiation therapy as planned.  Follow-up visit the week of January 18.

## 2014-08-20 ENCOUNTER — Ambulatory Visit
Admission: RE | Admit: 2014-08-20 | Discharge: 2014-08-20 | Disposition: A | Discharge status: Home / Self Care | Payer: 59 | Source: Ambulatory Visit | Attending: Radiation Oncology | Admitting: Radiation Oncology

## 2014-08-20 DIAGNOSIS — Z510 Encounter for antineoplastic radiation therapy: Secondary | ICD-10-CM | POA: Diagnosis not present

## 2014-08-21 ENCOUNTER — Ambulatory Visit
Admission: RE | Admit: 2014-08-21 | Discharge: 2014-08-21 | Disposition: A | Discharge status: Home / Self Care | Payer: 59 | Source: Ambulatory Visit | Attending: Radiation Oncology | Admitting: Radiation Oncology

## 2014-08-21 DIAGNOSIS — Z510 Encounter for antineoplastic radiation therapy: Secondary | ICD-10-CM | POA: Diagnosis not present

## 2014-08-22 ENCOUNTER — Ambulatory Visit
Admission: RE | Admit: 2014-08-22 | Discharge: 2014-08-22 | Disposition: A | Discharge status: Home / Self Care | Payer: 59 | Source: Ambulatory Visit | Attending: Radiation Oncology | Admitting: Radiation Oncology

## 2014-08-22 DIAGNOSIS — Z510 Encounter for antineoplastic radiation therapy: Secondary | ICD-10-CM | POA: Diagnosis not present

## 2014-08-23 ENCOUNTER — Ambulatory Visit
Admission: RE | Admit: 2014-08-23 | Discharge: 2014-08-23 | Disposition: A | Discharge status: Home / Self Care | Payer: 59 | Source: Ambulatory Visit | Attending: Radiation Oncology | Admitting: Radiation Oncology

## 2014-08-23 DIAGNOSIS — Z510 Encounter for antineoplastic radiation therapy: Secondary | ICD-10-CM | POA: Diagnosis not present

## 2014-08-26 ENCOUNTER — Ambulatory Visit
Admission: RE | Admit: 2014-08-26 | Discharge: 2014-08-26 | Disposition: A | Discharge status: Home / Self Care | Payer: 59 | Source: Ambulatory Visit | Attending: Radiation Oncology | Admitting: Radiation Oncology

## 2014-08-26 ENCOUNTER — Encounter: Payer: Self-pay | Admitting: Radiation Oncology

## 2014-08-26 VITALS — BP 135/80 | HR 85 | Temp 98.0°F | Resp 20 | Wt 364.4 lb

## 2014-08-26 DIAGNOSIS — Z510 Encounter for antineoplastic radiation therapy: Secondary | ICD-10-CM | POA: Diagnosis not present

## 2014-08-26 DIAGNOSIS — C61 Malignant neoplasm of prostate: Secondary | ICD-10-CM

## 2014-08-26 NOTE — Progress Notes (Signed)
Patient denies pain, bowel issues, loss of appetite. He is fatigued and states he continues to have some urinary urgency. Nocturia was 3-5 but only once last night. He denies other urinary issues. He completes Wed, gave him 1 month FU card.

## 2014-08-26 NOTE — Progress Notes (Signed)
Weekly Management Note:  Site: prostate Current Dose:   7410 cGy Projected Dose:  7800  cGy  Narrative: The patient is seen today for routine under treatment assessment. CBCT/MVCT images/port films were reviewed. The chart was reviewed.  No new complaints     Physical Examination:  Filed Vitals:   08/26/14 1500  BP: 135/80  Pulse: 85  Temp: 98 F (36.7 C)  Resp: 20  .  Weight: 364 lb 6.4 oz (165.291 kg).  NAD  Impression: Tolerating radiation therapy well.   Plan: Continue radiation therapy as planned. F/u in 69mo  -----------------------------------  Eppie Gibson, MD

## 2014-08-27 ENCOUNTER — Ambulatory Visit
Admission: RE | Admit: 2014-08-27 | Discharge: 2014-08-27 | Disposition: A | Discharge status: Home / Self Care | Payer: 59 | Source: Ambulatory Visit | Attending: Radiation Oncology | Admitting: Radiation Oncology

## 2014-08-27 DIAGNOSIS — Z510 Encounter for antineoplastic radiation therapy: Secondary | ICD-10-CM | POA: Diagnosis not present

## 2014-08-28 ENCOUNTER — Ambulatory Visit
Admission: RE | Admit: 2014-08-28 | Discharge: 2014-08-28 | Disposition: A | Discharge status: Home / Self Care | Payer: 59 | Source: Ambulatory Visit | Attending: Radiation Oncology | Admitting: Radiation Oncology

## 2014-08-28 ENCOUNTER — Ambulatory Visit: Payer: 59

## 2014-08-28 DIAGNOSIS — Z510 Encounter for antineoplastic radiation therapy: Secondary | ICD-10-CM | POA: Diagnosis not present

## 2014-08-29 ENCOUNTER — Ambulatory Visit: Payer: 59

## 2014-08-31 ENCOUNTER — Encounter: Payer: Self-pay | Admitting: Radiation Oncology

## 2014-08-31 NOTE — Progress Notes (Signed)
[facility] End of Treatment Note  Name:Frank Cox  Date: 08/31/2014 NPY:[id number] DOB:11/03/1960   Status:outpatient    CC: Gara Kroner, MD  Dr. Phebe Colla  REFERRING PHYSICIAN:  Dr. Phebe Colla   DIAGNOSIS:  Stage TIc high-risk adenocarcinoma prostate  INDICATION FOR TREATMENT: Curative   TREATMENT DATES: 07/03/2014 through 08/28/2014                          SITE/DOSE:   Prostate 7800 cGy in 40 sessions, seminal vesicles 5600 cGy in 40 sessions                         BEAMS/ENERGY:    6 MV photons with dual ARC VMAT IMRT.  Also received androgen deprivation therapy for his high-risk disease.             NARRATIVE:  [name] tolerated treatment well with no significant GU or GI toxicity by completion of therapy except for mild worsening of his nocturia which should resolve in the near future.                          PLAN: Routine followup in one month. Patient instructed to call if questions or worsening complaints in interim.

## 2014-08-31 NOTE — Progress Notes (Signed)
Chart note: The patient began his IMRT in the management of his carcinoma the prostate on 07/03/2014.  He was treated with dual ARC VMAT IMRT with 2 sets of dynamic MLCs corresponding to one set of IMRT treatment devices ([id number]).

## 2014-09-23 ENCOUNTER — Encounter: Payer: Self-pay | Admitting: Radiation Oncology

## 2014-09-24 ENCOUNTER — Encounter: Payer: Self-pay | Admitting: Radiation Oncology

## 2014-09-24 ENCOUNTER — Ambulatory Visit
Admission: RE | Admit: 2014-09-24 | Discharge: 2014-09-24 | Disposition: A | Discharge status: Home / Self Care | Payer: 59 | Source: Ambulatory Visit | Attending: Radiation Oncology | Admitting: Radiation Oncology

## 2014-09-24 VITALS — BP 145/86 | HR 79 | Temp 98.5°F | Ht 70.0 in | Wt 336.6 lb

## 2014-09-24 DIAGNOSIS — C61 Malignant neoplasm of prostate: Secondary | ICD-10-CM

## 2014-09-24 HISTORY — DX: Personal history of irradiation: Z92.3

## 2014-09-24 NOTE — Progress Notes (Signed)
Frank Cox here for assessment s/p radiation to his pelvis.  He reports nocturia 3-4 times, with mild burning at the end.  He admits to incomplete emptying intermittently.  Recent colonsocopy with multiple polyps, waiting on results.

## 2014-09-24 NOTE — Progress Notes (Signed)
CC: Dr. Phebe Colla  Follow-up note:  [name] returns today approximately 1 month following completion of radiation therapy along with androgen deprivation therapy in the management of his stage TIc high-risk adenocarcinoma prostate.  Our plan is for 2 years of androgen deprivation therapy.  He is doing reasonably well from a GU and GI standpoint although he does have occasional urinary urgency with nocturia 3-4.  He is on tamsulosin.  He plans to return to work on February 1.  He is concerned about multiple polyps found on recent colonoscopy with pathology pending at this time.  He plans on seeing Dr. Phebe Colla for a follow-up visit on March 3 after lab work on February 25.  Physical examination: Alert and oriented. Filed Vitals:   09/24/14 1518  BP: 145/86  Pulse: 79  Temp: 98.5 F (36.9 C)   Rectal examination not performed today.  Impression: Satisfactory progress.  I expect his urinary symptoms to improve over the next one to 2 months.  Plan: Follow-up through Dr. Tresa Moore.  I've not scheduled [name] for a formal follow-up visit and I ask that Dr. Tresa Moore keep me posted on his progress.  Letter is written today for him to return to work on 10/07/2014.

## 2014-09-25 ENCOUNTER — Telehealth: Payer: Self-pay | Admitting: *Deleted

## 2014-09-25 ENCOUNTER — Inpatient Hospital Stay (HOSPITAL_COMMUNITY)
Admission: EM | Admit: 2014-09-25 | Discharge: 2014-09-29 | DRG: 920 | Disposition: A | Discharge status: Home / Self Care | Payer: 59 | Attending: Internal Medicine | Admitting: Internal Medicine

## 2014-09-25 ENCOUNTER — Encounter (HOSPITAL_COMMUNITY): Payer: Self-pay

## 2014-09-25 DIAGNOSIS — Z86010 Personal history of colonic polyps: Secondary | ICD-10-CM

## 2014-09-25 DIAGNOSIS — Z923 Personal history of irradiation: Secondary | ICD-10-CM

## 2014-09-25 DIAGNOSIS — K922 Gastrointestinal hemorrhage, unspecified: Secondary | ICD-10-CM | POA: Diagnosis present

## 2014-09-25 DIAGNOSIS — C61 Malignant neoplasm of prostate: Secondary | ICD-10-CM | POA: Diagnosis present

## 2014-09-25 DIAGNOSIS — E860 Dehydration: Secondary | ICD-10-CM | POA: Diagnosis present

## 2014-09-25 DIAGNOSIS — I10 Essential (primary) hypertension: Secondary | ICD-10-CM | POA: Diagnosis present

## 2014-09-25 DIAGNOSIS — Z6841 Body Mass Index (BMI) 40.0 and over, adult: Secondary | ICD-10-CM

## 2014-09-25 DIAGNOSIS — K91840 Postprocedural hemorrhage and hematoma of a digestive system organ or structure following a digestive system procedure: Secondary | ICD-10-CM | POA: Diagnosis not present

## 2014-09-25 DIAGNOSIS — K625 Hemorrhage of anus and rectum: Secondary | ICD-10-CM | POA: Diagnosis not present

## 2014-09-25 DIAGNOSIS — Z85820 Personal history of malignant melanoma of skin: Secondary | ICD-10-CM

## 2014-09-25 DIAGNOSIS — Y838 Other surgical procedures as the cause of abnormal reaction of the patient, or of later complication, without mention of misadventure at the time of the procedure: Secondary | ICD-10-CM | POA: Diagnosis present

## 2014-09-25 DIAGNOSIS — F32A Depression, unspecified: Secondary | ICD-10-CM | POA: Diagnosis present

## 2014-09-25 DIAGNOSIS — F329 Major depressive disorder, single episode, unspecified: Secondary | ICD-10-CM | POA: Diagnosis present

## 2014-09-25 DIAGNOSIS — F419 Anxiety disorder, unspecified: Secondary | ICD-10-CM | POA: Diagnosis present

## 2014-09-25 DIAGNOSIS — N179 Acute kidney failure, unspecified: Secondary | ICD-10-CM | POA: Diagnosis present

## 2014-09-25 DIAGNOSIS — H9190 Unspecified hearing loss, unspecified ear: Secondary | ICD-10-CM | POA: Diagnosis present

## 2014-09-25 DIAGNOSIS — M1990 Unspecified osteoarthritis, unspecified site: Secondary | ICD-10-CM | POA: Diagnosis present

## 2014-09-25 DIAGNOSIS — R740 Nonspecific elevation of levels of transaminase and lactic acid dehydrogenase [LDH]: Secondary | ICD-10-CM | POA: Diagnosis present

## 2014-09-25 DIAGNOSIS — E119 Type 2 diabetes mellitus without complications: Secondary | ICD-10-CM | POA: Diagnosis present

## 2014-09-25 DIAGNOSIS — Z79899 Other long term (current) drug therapy: Secondary | ICD-10-CM

## 2014-09-25 DIAGNOSIS — Z87442 Personal history of urinary calculi: Secondary | ICD-10-CM

## 2014-09-25 DIAGNOSIS — F319 Bipolar disorder, unspecified: Secondary | ICD-10-CM | POA: Diagnosis present

## 2014-09-25 DIAGNOSIS — D62 Acute posthemorrhagic anemia: Secondary | ICD-10-CM | POA: Diagnosis present

## 2014-09-25 DIAGNOSIS — Z9884 Bariatric surgery status: Secondary | ICD-10-CM

## 2014-09-25 LAB — COMPREHENSIVE METABOLIC PANEL
ALK PHOS: 62 U/L (ref 39–117)
ALT: 71 U/L — AB (ref 0–53)
AST: 56 U/L — ABNORMAL HIGH (ref 0–37)
Albumin: 4.1 g/dL (ref 3.5–5.2)
Anion gap: 8 (ref 5–15)
BUN: 18 mg/dL (ref 6–23)
CO2: 20 mmol/L (ref 19–32)
CREATININE: 1.38 mg/dL — AB (ref 0.50–1.35)
Calcium: 10.6 mg/dL — ABNORMAL HIGH (ref 8.4–10.5)
Chloride: 111 mEq/L (ref 96–112)
GFR calc non Af Amer: 57 mL/min — ABNORMAL LOW (ref >90)
GFR, EST AFRICAN AMERICAN: 66 mL/min — AB (ref >90)
Glucose, Bld: 291 mg/dL — ABNORMAL HIGH (ref 70–99)
POTASSIUM: 4.1 mmol/L (ref 3.5–5.1)
Sodium: 139 mmol/L (ref 135–145)
TOTAL PROTEIN: 7.3 g/dL (ref 6.0–8.3)
Total Bilirubin: 0.6 mg/dL (ref 0.3–1.2)

## 2014-09-25 LAB — URINALYSIS, ROUTINE W REFLEX MICROSCOPIC
BILIRUBIN URINE: NEGATIVE
Glucose, UA: 100 mg/dL — AB
Hgb urine dipstick: NEGATIVE
Ketones, ur: NEGATIVE mg/dL
LEUKOCYTES UA: NEGATIVE
Nitrite: NEGATIVE
Protein, ur: 30 mg/dL — AB
SPECIFIC GRAVITY, URINE: 1.007 (ref 1.005–1.030)
UROBILINOGEN UA: 0.2 mg/dL (ref 0.0–1.0)
pH: 6 (ref 5.0–8.0)

## 2014-09-25 LAB — CBC WITH DIFFERENTIAL/PLATELET
BASOS PCT: 0 % (ref 0–1)
Basophils Absolute: 0 10*3/uL (ref 0.0–0.1)
EOS ABS: 0.2 10*3/uL (ref 0.0–0.7)
EOS PCT: 2 % (ref 0–5)
HCT: 35.4 % — ABNORMAL LOW (ref 39.0–52.0)
Hemoglobin: 11.5 g/dL — ABNORMAL LOW (ref 13.0–17.0)
Lymphocytes Relative: 11 % — ABNORMAL LOW (ref 12–46)
Lymphs Abs: 1.1 10*3/uL (ref 0.7–4.0)
MCH: 31.3 pg (ref 26.0–34.0)
MCHC: 32.5 g/dL (ref 30.0–36.0)
MCV: 96.5 fL (ref 78.0–100.0)
MONOS PCT: 6 % (ref 3–12)
Monocytes Absolute: 0.6 10*3/uL (ref 0.1–1.0)
Neutro Abs: 8.1 10*3/uL — ABNORMAL HIGH (ref 1.7–7.7)
Neutrophils Relative %: 81 % — ABNORMAL HIGH (ref 43–77)
Platelets: 311 10*3/uL (ref 150–400)
RBC: 3.67 MIL/uL — ABNORMAL LOW (ref 4.22–5.81)
RDW: 13.7 % (ref 11.5–15.5)
WBC: 9.9 10*3/uL (ref 4.0–10.5)

## 2014-09-25 LAB — POC OCCULT BLOOD, ED: FECAL OCCULT BLD: POSITIVE — AB

## 2014-09-25 LAB — URINE MICROSCOPIC-ADD ON

## 2014-09-25 MED ORDER — SODIUM CHLORIDE 0.9 % IV SOLN
INTRAVENOUS | Status: DC
  Administered 2014-09-25: via INTRAVENOUS

## 2014-09-25 NOTE — ED Provider Notes (Signed)
CSN: [id number]     Arrival date & time 09/25/14  2154 History   First MD Initiated Contact with Patient 09/25/14 2253     Chief Complaint  Patient presents with  . Rectal Bleeding     (Consider location/radiation/quality/duration/timing/severity/associated sxs/prior Treatment) HPI  This is a 54 year old male with a history of laparoscopic gastric banding for morbid obesity. He is currently undergoing radiation therapy for prostate cancer. He had a colonoscopy about a week ago. He was passing a small amount of blood rectally several days ago. Today he started passing bright red blood per rectum. He quantifies it is over a cup full of blood. There was some small amount of melena associated with this. He denies any abdominal pain, nausea, vomiting, chest pain, shortness of breath or lightheadedness. He does have perirectal inflammation as a result of his radiation treatments.   Past Medical History  Diagnosis Date  . Depression   . Anxiety   . CPAP (continuous positive airway pressure) dependence   . Poor circulation   . Hypertension     boarderline - elevated  . Fatigue   . Diabetes mellitus   . Kidney stone   . Generalized headaches   . Hearing loss   . Contact lens/glasses fitting   . Bruises easily   . Obesity   . Prostate cancer 02/14/14    Gleason 4+3=7, volume 56 mL  . Arthritis   . Hyperlipidemia   . Bipolar 1 disorder     "well controlled on Lithium"  . S/P radiation therapy 07/03/2014 through 08/28/2014                                                        Prostate 7800 cGy in 40 sessions, seminal vesicles 5600 cGy in 40 sessions                         Past Surgical History  Procedure Laterality Date  . Laparoscopic gastric banding  09/09/09  . Prostate biopsy  02/14/14    Gleason 7, volume 56 mL   Family History  Problem Relation Age of Onset  . Arthritis Mother   . Alcohol abuse Mother   . Arthritis Father    History  Substance Use Topics  . Smoking  status: Never Smoker   . Smokeless tobacco: Never Used  . Alcohol Use: No    Review of Systems  All other systems reviewed and are negative.   Allergies  Review of patient's allergies indicates no known allergies.  Home Medications   Prior to Admission medications   Medication Sig Start Date End Date Taking? Authorizing Provider  acetaminophen (TYLENOL) 500 MG tablet Take 1,000 mg by mouth every 6 (six) hours as needed for mild pain.   Yes Historical Provider, MD  citalopram (CELEXA) 40 MG tablet Take 40 mg by mouth daily.     Yes Historical Provider, MD  clonazePAM (KLONOPIN) 1 MG tablet Take 3 mg by mouth at bedtime.    Yes Historical Provider, MD  glimepiride (AMARYL) 4 MG tablet Take 2 tablets (8 mg total) by mouth daily with breakfast. Patient taking differently: Take 4 mg by mouth daily with breakfast.  11/19/13  Yes Geradine Girt, DO  lamoTRIgine (LAMICTAL) 200 MG tablet Take 200 mg by mouth at  bedtime.    Yes Historical Provider, MD  lithium carbonate (ESKALITH) 450 MG CR tablet Take 1,800 mg by mouth at bedtime.    Yes Historical Provider, MD  lurasidone (LATUDA) 40 MG TABS Take 40 mg by mouth at bedtime.    Yes Historical Provider, MD  metoprolol succinate (TOPROL-XL) 25 MG 24 hr tablet Take 25 mg by mouth every morning.  04/29/11  Yes Historical Provider, MD  Multiple Vitamin (MULTIVITAMIN PO) Take 1 tablet by mouth every morning. [employer] brand Solotron Chewable.   Yes Historical Provider, MD  QUEtiapine (SEROQUEL) 100 MG tablet Take 200 mg by mouth at bedtime.  06/10/14  Yes Historical Provider, MD  tamsulosin (FLOMAX) 0.4 MG CAPS capsule Take 0.4 mg by mouth daily after breakfast.    Yes Historical Provider, MD  diphenhydrAMINE (BENADRYL) 25 MG tablet Take 50 mg by mouth every 6 (six) hours as needed for allergies.    Historical Provider, MD   BP 161/78 mmHg  Pulse 86  Temp(Src) 98.1 F (36.7 C) (Oral)  Resp 18  Ht 5\' 10"  (1.778 m)  Wt 353 lb 13.4 oz (160.5 kg)  BMI 50.77  kg/m2  SpO2 98%   Physical Exam  General: Well-developed, obese male in no acute distress; appearance consistent with age of record HENT: normocephalic; atraumatic; oropharynx normal Eyes: pupils equal, round and reactive to light; extraocular muscles intact Neck: supple Heart: regular rate and rhythm Lungs: clear to auscultation bilaterally Abdomen: soft; obese; nontender; bowel sounds present Rectal: Inflammatory changes of skin of buttocks and perirectal area; normal sphincter tone; no formed stool in vault; small amount of blood on examining glove Extremities: No deformity; full range of motion; pulses normal Neurologic: Awake, alert and oriented; motor function intact in all extremities and symmetric; no facial droop Skin: Warm and dry; chronic appearing hyperpigmentation of the lower legs Psychiatric: Normal mood and affect   ED Course  Procedures (including critical care time)   MDM   Nursing notes and vitals signs, including pulse oximetry, reviewed.  Summary of this visit's results, reviewed by myself:  Labs:  Results for orders placed or performed during the hospital encounter of 09/25/14 (from the past 24 hour(s))  Urinalysis, Routine w reflex microscopic     Status: Abnormal   Collection Time: 09/25/14 10:28 PM  Result Value Ref Range   Color, Urine YELLOW YELLOW   APPearance CLEAR CLEAR   Specific Gravity, Urine 1.007 1.005 - 1.030   pH 6.0 5.0 - 8.0   Glucose, UA 100 (A) NEGATIVE mg/dL   Hgb urine dipstick NEGATIVE NEGATIVE   Bilirubin Urine NEGATIVE NEGATIVE   Ketones, ur NEGATIVE NEGATIVE mg/dL   Protein, ur 30 (A) NEGATIVE mg/dL   Urobilinogen, UA 0.2 0.0 - 1.0 mg/dL   Nitrite NEGATIVE NEGATIVE   Leukocytes, UA NEGATIVE NEGATIVE  Urine microscopic-add on     Status: None   Collection Time: 09/25/14 10:28 PM  Result Value Ref Range   Squamous Epithelial / LPF RARE RARE   WBC, UA 0-2 <3 WBC/hpf  CBC with Differential     Status: Abnormal   Collection  Time: 09/25/14 10:53 PM  Result Value Ref Range   WBC 9.9 4.0 - 10.5 K/uL   RBC 3.67 (L) 4.22 - 5.81 MIL/uL   Hemoglobin 11.5 (L) 13.0 - 17.0 g/dL   HCT 35.4 (L) 39.0 - 52.0 %   MCV 96.5 78.0 - 100.0 fL   MCH 31.3 26.0 - 34.0 pg   MCHC 32.5 30.0 - 36.0  g/dL   RDW 13.7 11.5 - 15.5 %   Platelets 311 150 - 400 K/uL   Neutrophils Relative % 81 (H) 43 - 77 %   Neutro Abs 8.1 (H) 1.7 - 7.7 K/uL   Lymphocytes Relative 11 (L) 12 - 46 %   Lymphs Abs 1.1 0.7 - 4.0 K/uL   Monocytes Relative 6 3 - 12 %   Monocytes Absolute 0.6 0.1 - 1.0 K/uL   Eosinophils Relative 2 0 - 5 %   Eosinophils Absolute 0.2 0.0 - 0.7 K/uL   Basophils Relative 0 0 - 1 %   Basophils Absolute 0.0 0.0 - 0.1 K/uL  Comprehensive metabolic panel     Status: Abnormal   Collection Time: 09/25/14 10:53 PM  Result Value Ref Range   Sodium 139 135 - 145 mmol/L   Potassium 4.1 3.5 - 5.1 mmol/L   Chloride 111 96 - 112 mEq/L   CO2 20 19 - 32 mmol/L   Glucose, Bld 291 (H) 70 - 99 mg/dL   BUN 18 6 - 23 mg/dL   Creatinine, Ser 1.38 (H) 0.50 - 1.35 mg/dL   Calcium 10.6 (H) 8.4 - 10.5 mg/dL   Total Protein 7.3 6.0 - 8.3 g/dL   Albumin 4.1 3.5 - 5.2 g/dL   AST 56 (H) 0 - 37 U/L   ALT 71 (H) 0 - 53 U/L   Alkaline Phosphatase 62 39 - 117 U/L   Total Bilirubin 0.6 0.3 - 1.2 mg/dL   GFR calc non Af Amer 57 (L) >90 mL/min   GFR calc Af Amer 66 (L) >90 mL/min   Anion gap 8 5 - 15  Lithium level     Status: Abnormal   Collection Time: 09/25/14 11:31 PM  Result Value Ref Range   Lithium Lvl 0.42 (L) 0.80 - 1.40 mmol/L  POC occult blood, ED Provider will collect     Status: Abnormal   Collection Time: 09/25/14 11:33 PM  Result Value Ref Range   Fecal Occult Bld POSITIVE (A) NEGATIVE  Protime-INR     Status: None   Collection Time: 09/26/14  1:52 AM  Result Value Ref Range   Prothrombin Time 13.9 11.6 - 15.2 seconds   INR 1.06 0.00 - 1.49  Type and screen     Status: None   Collection Time: 09/26/14  3:24 AM  Result Value Ref  Range   ABO/RH(D) O POS    Antibody Screen NEG    Sample Expiration 09/29/2014   ABO/Rh     Status: None   Collection Time: 09/26/14  3:27 AM  Result Value Ref Range   ABO/RH(D) O POS   CBG monitoring, ED     Status: Abnormal   Collection Time: 09/26/14  4:00 AM  Result Value Ref Range   Glucose-Capillary 217 (H) 70 - 99 mg/dL  Magnesium     Status: None   Collection Time: 09/26/14  5:01 AM  Result Value Ref Range   Magnesium 2.0 1.5 - 2.5 mg/dL  Phosphorus     Status: None   Collection Time: 09/26/14  5:01 AM  Result Value Ref Range   Phosphorus 3.7 2.3 - 4.6 mg/dL  Comprehensive metabolic panel     Status: Abnormal   Collection Time: 09/26/14  5:01 AM  Result Value Ref Range   Sodium 140 135 - 145 mmol/L   Potassium 4.3 3.5 - 5.1 mmol/L   Chloride 111 96 - 112 mEq/L   CO2 20 19 - 32 mmol/L  Glucose, Bld 221 (H) 70 - 99 mg/dL   BUN 18 6 - 23 mg/dL   Creatinine, Ser 1.32 0.50 - 1.35 mg/dL   Calcium 10.2 8.4 - 10.5 mg/dL   Total Protein 6.8 6.0 - 8.3 g/dL   Albumin 3.8 3.5 - 5.2 g/dL   AST 51 (H) 0 - 37 U/L   ALT 65 (H) 0 - 53 U/L   Alkaline Phosphatase 55 39 - 117 U/L   Total Bilirubin 0.6 0.3 - 1.2 mg/dL   GFR calc non Af Amer 60 (L) >90 mL/min   GFR calc Af Amer 69 (L) >90 mL/min   Anion gap 9 5 - 15  CBC     Status: Abnormal   Collection Time: 09/26/14  5:01 AM  Result Value Ref Range   WBC 7.8 4.0 - 10.5 K/uL   RBC 3.40 (L) 4.22 - 5.81 MIL/uL   Hemoglobin 10.8 (L) 13.0 - 17.0 g/dL   HCT 32.8 (L) 39.0 - 52.0 %   MCV 96.5 78.0 - 100.0 fL   MCH 31.8 26.0 - 34.0 pg   MCHC 32.9 30.0 - 36.0 g/dL   RDW 13.7 11.5 - 15.5 %   Platelets 248 150 - 400 K/uL        Wynetta Fines, MD 09/26/14 0710

## 2014-09-25 NOTE — Telephone Encounter (Signed)
On 09-25-14 mail letter to patient.

## 2014-09-25 NOTE — ED Notes (Signed)
Pt complains of rectal bleeding for two days, he had a colonoscopy one week ago, no results yet and no problems after procedure

## 2014-09-25 NOTE — ED Notes (Addendum)
Pt went to restroom, states he did not have a BM, states he did not have the urge like he had been having at home, pt also stated he has a hx of prostate ca which finished radiation in December. States was told they found polyps on his colonoscopy also which he has not heard any other results.

## 2014-09-25 NOTE — ED Notes (Signed)
Pt states had a colonoscopy about a week ago, drank some alcohol couple days after, states noticed some blood in stool after but went away, then today after drinking soda, which pt states he drinks soda all the time, states he started having maroon colored bloody stools, denies nausea or vomiting, denies pain or abdominal pain, denies dizziness. Pt ambulated to restroom w/o difficulty.

## 2014-09-26 ENCOUNTER — Encounter (HOSPITAL_COMMUNITY): Payer: Self-pay | Admitting: Internal Medicine

## 2014-09-26 DIAGNOSIS — Z9884 Bariatric surgery status: Secondary | ICD-10-CM | POA: Diagnosis not present

## 2014-09-26 DIAGNOSIS — R740 Nonspecific elevation of levels of transaminase and lactic acid dehydrogenase [LDH]: Secondary | ICD-10-CM | POA: Diagnosis present

## 2014-09-26 DIAGNOSIS — Z79899 Other long term (current) drug therapy: Secondary | ICD-10-CM | POA: Diagnosis not present

## 2014-09-26 DIAGNOSIS — E860 Dehydration: Secondary | ICD-10-CM | POA: Diagnosis present

## 2014-09-26 DIAGNOSIS — Z923 Personal history of irradiation: Secondary | ICD-10-CM | POA: Diagnosis not present

## 2014-09-26 DIAGNOSIS — K625 Hemorrhage of anus and rectum: Secondary | ICD-10-CM | POA: Diagnosis present

## 2014-09-26 DIAGNOSIS — Z86010 Personal history of colonic polyps: Secondary | ICD-10-CM | POA: Diagnosis not present

## 2014-09-26 DIAGNOSIS — Z6841 Body Mass Index (BMI) 40.0 and over, adult: Secondary | ICD-10-CM | POA: Diagnosis not present

## 2014-09-26 DIAGNOSIS — F419 Anxiety disorder, unspecified: Secondary | ICD-10-CM | POA: Diagnosis present

## 2014-09-26 DIAGNOSIS — Z87442 Personal history of urinary calculi: Secondary | ICD-10-CM | POA: Diagnosis not present

## 2014-09-26 DIAGNOSIS — F319 Bipolar disorder, unspecified: Secondary | ICD-10-CM | POA: Diagnosis present

## 2014-09-26 DIAGNOSIS — I10 Essential (primary) hypertension: Secondary | ICD-10-CM | POA: Diagnosis present

## 2014-09-26 DIAGNOSIS — H9190 Unspecified hearing loss, unspecified ear: Secondary | ICD-10-CM | POA: Diagnosis present

## 2014-09-26 DIAGNOSIS — D62 Acute posthemorrhagic anemia: Secondary | ICD-10-CM | POA: Diagnosis present

## 2014-09-26 DIAGNOSIS — E119 Type 2 diabetes mellitus without complications: Secondary | ICD-10-CM | POA: Diagnosis present

## 2014-09-26 DIAGNOSIS — C61 Malignant neoplasm of prostate: Secondary | ICD-10-CM | POA: Diagnosis present

## 2014-09-26 DIAGNOSIS — E11628 Type 2 diabetes mellitus with other skin complications: Secondary | ICD-10-CM

## 2014-09-26 DIAGNOSIS — K922 Gastrointestinal hemorrhage, unspecified: Secondary | ICD-10-CM

## 2014-09-26 DIAGNOSIS — Z85820 Personal history of malignant melanoma of skin: Secondary | ICD-10-CM | POA: Diagnosis not present

## 2014-09-26 DIAGNOSIS — K91840 Postprocedural hemorrhage and hematoma of a digestive system organ or structure following a digestive system procedure: Secondary | ICD-10-CM | POA: Diagnosis present

## 2014-09-26 DIAGNOSIS — N179 Acute kidney failure, unspecified: Secondary | ICD-10-CM | POA: Diagnosis present

## 2014-09-26 DIAGNOSIS — M1990 Unspecified osteoarthritis, unspecified site: Secondary | ICD-10-CM | POA: Diagnosis present

## 2014-09-26 DIAGNOSIS — Y838 Other surgical procedures as the cause of abnormal reaction of the patient, or of later complication, without mention of misadventure at the time of the procedure: Secondary | ICD-10-CM | POA: Diagnosis present

## 2014-09-26 LAB — TYPE AND SCREEN
ABO/RH(D): O POS
Antibody Screen: NEGATIVE

## 2014-09-26 LAB — CBC
HCT: 32.8 % — ABNORMAL LOW (ref 39.0–52.0)
HEMOGLOBIN: 10.8 g/dL — AB (ref 13.0–17.0)
MCH: 31.8 pg (ref 26.0–34.0)
MCHC: 32.9 g/dL (ref 30.0–36.0)
MCV: 96.5 fL (ref 78.0–100.0)
Platelets: 248 10*3/uL (ref 150–400)
RBC: 3.4 MIL/uL — ABNORMAL LOW (ref 4.22–5.81)
RDW: 13.7 % (ref 11.5–15.5)
WBC: 7.8 10*3/uL (ref 4.0–10.5)

## 2014-09-26 LAB — COMPREHENSIVE METABOLIC PANEL
ALBUMIN: 3.8 g/dL (ref 3.5–5.2)
ALK PHOS: 55 U/L (ref 39–117)
ALT: 65 U/L — ABNORMAL HIGH (ref 0–53)
AST: 51 U/L — AB (ref 0–37)
Anion gap: 9 (ref 5–15)
BILIRUBIN TOTAL: 0.6 mg/dL (ref 0.3–1.2)
BUN: 18 mg/dL (ref 6–23)
CO2: 20 mmol/L (ref 19–32)
Calcium: 10.2 mg/dL (ref 8.4–10.5)
Chloride: 111 mEq/L (ref 96–112)
Creatinine, Ser: 1.32 mg/dL (ref 0.50–1.35)
GFR calc Af Amer: 69 mL/min — ABNORMAL LOW (ref >90)
GFR, EST NON AFRICAN AMERICAN: 60 mL/min — AB (ref >90)
Glucose, Bld: 221 mg/dL — ABNORMAL HIGH (ref 70–99)
Potassium: 4.3 mmol/L (ref 3.5–5.1)
SODIUM: 140 mmol/L (ref 135–145)
Total Protein: 6.8 g/dL (ref 6.0–8.3)

## 2014-09-26 LAB — HEMOGLOBIN A1C
Hgb A1c MFr Bld: 7.2 % — ABNORMAL HIGH (ref <5.7)
MEAN PLASMA GLUCOSE: 160 mg/dL — AB (ref <117)

## 2014-09-26 LAB — LITHIUM LEVEL: LITHIUM LVL: 0.42 mmol/L — AB (ref 0.80–1.40)

## 2014-09-26 LAB — GLUCOSE, CAPILLARY
GLUCOSE-CAPILLARY: 166 mg/dL — AB (ref 70–99)
Glucose-Capillary: 180 mg/dL — ABNORMAL HIGH (ref 70–99)
Glucose-Capillary: 158 mg/dL — ABNORMAL HIGH (ref 70–99)

## 2014-09-26 LAB — MRSA PCR SCREENING: MRSA by PCR: NEGATIVE

## 2014-09-26 LAB — PROTIME-INR
INR: 1.06 (ref 0.00–1.49)
PROTHROMBIN TIME: 13.9 s (ref 11.6–15.2)

## 2014-09-26 LAB — PHOSPHORUS: PHOSPHORUS: 3.7 mg/dL (ref 2.3–4.6)

## 2014-09-26 LAB — ABO/RH: ABO/RH(D): O POS

## 2014-09-26 LAB — TSH: TSH: 1.468 u[IU]/mL (ref 0.350–4.500)

## 2014-09-26 LAB — MAGNESIUM: MAGNESIUM: 2 mg/dL (ref 1.5–2.5)

## 2014-09-26 LAB — CBG MONITORING, ED: Glucose-Capillary: 217 mg/dL — ABNORMAL HIGH (ref 70–99)

## 2014-09-26 MED ORDER — PANTOPRAZOLE SODIUM 40 MG IV SOLR
8.0000 mg/h | INTRAVENOUS | Status: DC
  Administered 2014-09-26: 8 mg/h via INTRAVENOUS
  Filled 2014-09-26 (×2): qty 80

## 2014-09-26 MED ORDER — LAMOTRIGINE 200 MG PO TABS
200.0000 mg | ORAL_TABLET | Freq: Every day | ORAL | Status: DC
  Administered 2014-09-26 – 2014-09-28 (×3): 200 mg via ORAL
  Filled 2014-09-26 (×5): qty 1

## 2014-09-26 MED ORDER — CITALOPRAM HYDROBROMIDE 40 MG PO TABS
40.0000 mg | ORAL_TABLET | Freq: Every day | ORAL | Status: DC
  Administered 2014-09-27 – 2014-09-29 (×3): 40 mg via ORAL
  Filled 2014-09-26 (×4): qty 1

## 2014-09-26 MED ORDER — CLONAZEPAM 1 MG PO TABS
3.0000 mg | ORAL_TABLET | Freq: Every day | ORAL | Status: DC
  Administered 2014-09-26 – 2014-09-28 (×3): 3 mg via ORAL
  Filled 2014-09-26 (×3): qty 3

## 2014-09-26 MED ORDER — ACETAMINOPHEN 325 MG PO TABS: 650.0000 mg | ORAL_TABLET | Freq: Four times a day (QID) | ORAL | Status: DC | PRN

## 2014-09-26 MED ORDER — LITHIUM CARBONATE ER 450 MG PO TBCR
1800.0000 mg | EXTENDED_RELEASE_TABLET | Freq: Every day | ORAL | Status: DC
  Administered 2014-09-26 – 2014-09-28 (×3): 1800 mg via ORAL
  Filled 2014-09-26 (×5): qty 4

## 2014-09-26 MED ORDER — ONDANSETRON HCL 4 MG/2ML IJ SOLN: 4.0000 mg | Freq: Four times a day (QID) | INTRAMUSCULAR | Status: DC | PRN

## 2014-09-26 MED ORDER — SODIUM CHLORIDE 0.9 % IJ SOLN
3.0000 mL | Freq: Two times a day (BID) | INTRAMUSCULAR | Status: DC
  Administered 2014-09-26 – 2014-09-27 (×3): 3 mL via INTRAVENOUS

## 2014-09-26 MED ORDER — LURASIDONE HCL 40 MG PO TABS
40.0000 mg | ORAL_TABLET | Freq: Every day | ORAL | Status: DC
  Administered 2014-09-26 – 2014-09-28 (×3): 40 mg via ORAL
  Filled 2014-09-26 (×5): qty 1

## 2014-09-26 MED ORDER — INSULIN ASPART 100 UNIT/ML ~~LOC~~ SOLN
0.0000 [IU] | SUBCUTANEOUS | Status: DC
  Administered 2014-09-26 (×2): 3 [IU] via SUBCUTANEOUS
  Administered 2014-09-26: 2 [IU] via SUBCUTANEOUS
  Administered 2014-09-27: 3 [IU] via SUBCUTANEOUS
  Administered 2014-09-27: 1 [IU] via SUBCUTANEOUS
  Administered 2014-09-27 (×2): 2 [IU] via SUBCUTANEOUS
  Administered 2014-09-27: 3 [IU] via SUBCUTANEOUS
  Administered 2014-09-27 – 2014-09-28 (×2): 2 [IU] via SUBCUTANEOUS
  Administered 2014-09-28: 3 [IU] via SUBCUTANEOUS
  Administered 2014-09-28: 2 [IU] via SUBCUTANEOUS
  Administered 2014-09-28: 7 [IU] via SUBCUTANEOUS
  Administered 2014-09-28: 3 [IU] via SUBCUTANEOUS
  Administered 2014-09-29 (×2): 2 [IU] via SUBCUTANEOUS
  Filled 2014-09-26: qty 1

## 2014-09-26 MED ORDER — HYDROCODONE-ACETAMINOPHEN 5-325 MG PO TABS: 1.0000 | ORAL_TABLET | ORAL | Status: DC | PRN

## 2014-09-26 MED ORDER — PANTOPRAZOLE SODIUM 40 MG IV SOLR
40.0000 mg | Freq: Two times a day (BID) | INTRAVENOUS | Status: DC
  Administered 2014-09-26 – 2014-09-28 (×5): 40 mg via INTRAVENOUS
  Filled 2014-09-26 (×9): qty 40

## 2014-09-26 MED ORDER — SODIUM CHLORIDE 0.9 % IV SOLN
80.0000 mg | Freq: Once | INTRAVENOUS | Status: AC
  Administered 2014-09-26: 80 mg via INTRAVENOUS
  Filled 2014-09-26: qty 80

## 2014-09-26 MED ORDER — ONDANSETRON HCL 4 MG PO TABS: 4.0000 mg | ORAL_TABLET | Freq: Four times a day (QID) | ORAL | Status: DC | PRN

## 2014-09-26 MED ORDER — QUETIAPINE FUMARATE 200 MG PO TABS
200.0000 mg | ORAL_TABLET | Freq: Every day | ORAL | Status: DC
  Administered 2014-09-26 – 2014-09-28 (×3): 200 mg via ORAL
  Filled 2014-09-26 (×4): qty 1

## 2014-09-26 MED ORDER — METOPROLOL SUCCINATE ER 25 MG PO TB24
25.0000 mg | ORAL_TABLET | Freq: Every day | ORAL | Status: DC
  Administered 2014-09-27 – 2014-09-29 (×3): 25 mg via ORAL
  Filled 2014-09-26 (×3): qty 1

## 2014-09-26 MED ORDER — TAMSULOSIN HCL 0.4 MG PO CAPS
0.4000 mg | ORAL_CAPSULE | Freq: Every day | ORAL | Status: DC
  Administered 2014-09-27 – 2014-09-29 (×3): 0.4 mg via ORAL
  Filled 2014-09-26 (×4): qty 1

## 2014-09-26 MED ORDER — SODIUM CHLORIDE 0.9 % IV SOLN: INTRAVENOUS | Status: DC

## 2014-09-26 MED ORDER — ACETAMINOPHEN 650 MG RE SUPP: 650.0000 mg | Freq: Four times a day (QID) | RECTAL | Status: DC | PRN

## 2014-09-26 NOTE — Progress Notes (Signed)
Full report given to 5E RN. Pt stable for transport via wheelchair.

## 2014-09-26 NOTE — Progress Notes (Signed)
Pt seen and examined at bedside. Please see earlier note by Dr. Roel Cluck. Pt admitted with lower GI bleed. Hg drop since admission: 11.5 --> 10.8, pt reports bleeding is still present but much better. GI consulted for further assistance. Pt is NPO for now until seen by GI team. Pt stable for transfer to medical floor.  Faye Ramsay, MD  [facility] Pager [phone]  If 7PM-7AM, please contact night-coverage [email or web address] Password [email or web address]

## 2014-09-26 NOTE — ED Notes (Signed)
Pt placed in hospital bed for comfort, father at bedside and recliner chair provided for him, pt in no pain or distress. Pt requested to not be on monitor so he can take self to restroom when needed, will continue to closely monitor pt.

## 2014-09-26 NOTE — ED Notes (Signed)
Pt took his night time home medications that he brought from home, pt showed RN what he took, pt took 200 mg seroquel, 3mg  klonopin, 1800mg  lithium, 40 mg latuda, pt stated he did not want to wait for medications from pharmacy

## 2014-09-26 NOTE — Consult Note (Signed)
Subjective:   HPI  The patient is a 54 year old male who had a colonoscopy by me on January 13 with removal of multiple colon polyps. He had 2 polyps in the ascending colon which ranged in size from 10-15 mm, another smaller 5 mm polyp in the ascending colon, 2 polyps in the transverse colon which ranged in size from 10-15 mm, and a 6 mm polyp removed from the rectum.  He states that 2 or 3 days ago he started to experience rectal bleeding. The blood was a darker red color. He came to the emergency room where he was seen and admitted to the hospital. He feels today that the bleeding has slowed down. He did have a bowel movement this morning and states he only saw about a tablespoon of blood, which was much less than what he had seen before. He is not having any abdominal pain. He denies nausea or vomiting. He denies chest pain or shortness of breath or lightheadedness. He did have a slight drop in hemoglobin and hematocrit from admission.  Review of Systems Denies chest pain shortness of breath or abdominal pain  Past Medical History  Diagnosis Date  . Depression   . Anxiety   . CPAP (continuous positive airway pressure) dependence   . Poor circulation   . Hypertension     boarderline - elevated  . Fatigue   . Diabetes mellitus   . Kidney stone   . Generalized headaches   . Hearing loss   . Contact lens/glasses fitting   . Bruises easily   . Obesity   . Prostate cancer 02/14/14    Gleason 4+3=7, volume 56 mL  . Arthritis   . Hyperlipidemia   . Bipolar 1 disorder     "well controlled on Lithium"  . S/P radiation therapy 07/03/2014 through 08/28/2014                                                        Prostate 7800 cGy in 40 sessions, seminal vesicles 5600 cGy in 40 sessions                         Past Surgical History  Procedure Laterality Date  . Laparoscopic gastric banding  09/09/09  . Prostate biopsy  02/14/14    Gleason 7, volume 56 mL   History   Social History  .  Marital Status: Single    Spouse Name: N/A    Number of Children: N/A  . Years of Education: N/A   Occupational History  . Not on file.   Social History Main Topics  . Smoking status: Never Smoker   . Smokeless tobacco: Never Used  . Alcohol Use: No  . Drug Use: No  . Sexual Activity: Not on file   Other Topics Concern  . Not on file   Social History Narrative   family history includes Alcohol abuse in his mother; Arthritis in his father and mother.  Current facility-administered medications:  .  0.9 %  sodium chloride infusion, , Intravenous, Continuous, Theodis Blaze, MD, Last Rate: 50 mL/hr at 09/26/14 1012 .  acetaminophen (TYLENOL) tablet 650 mg, 650 mg, Oral, Q6H PRN **OR** acetaminophen (TYLENOL) suppository 650 mg, 650 mg, Rectal, Q6H PRN, Toy Baker, MD .  citalopram (CELEXA) tablet 40  mg, 40 mg, Oral, Daily, Toy Baker, MD, 40 mg at 09/26/14 1000 .  clonazePAM (KLONOPIN) tablet 3 mg, 3 mg, Oral, QHS, Toy Baker, MD .  HYDROcodone-acetaminophen (NORCO/VICODIN) 5-325 MG per tablet 1-2 tablet, 1-2 tablet, Oral, Q4H PRN, Toy Baker, MD .  insulin aspart (novoLOG) injection 0-9 Units, 0-9 Units, Subcutaneous, 6 times per day, Toy Baker, MD, 3 Units at 09/26/14 0421 .  lamoTRIgine (LAMICTAL) tablet 200 mg, 200 mg, Oral, QHS, Toy Baker, MD .  lithium carbonate (ESKALITH) CR tablet 1,800 mg, 1,800 mg, Oral, QHS, Toy Baker, MD .  lurasidone (LATUDA) tablet 40 mg, 40 mg, Oral, QHS, Toy Baker, MD .  metoprolol succinate (TOPROL-XL) 24 hr tablet 25 mg, 25 mg, Oral, Daily, Toy Baker, MD, 25 mg at 09/26/14 1000 .  ondansetron (ZOFRAN) tablet 4 mg, 4 mg, Oral, Q6H PRN **OR** ondansetron (ZOFRAN) injection 4 mg, 4 mg, Intravenous, Q6H PRN, Toy Baker, MD .  pantoprazole (PROTONIX) injection 40 mg, 40 mg, Intravenous, Q12H, Toy Baker, MD, 40 mg at 09/26/14 1011 .  QUEtiapine (SEROQUEL) tablet 200  mg, 200 mg, Oral, QHS, Anastassia Doutova, MD .  sodium chloride 0.9 % injection 3 mL, 3 mL, Intravenous, Q12H, Toy Baker, MD, 3 mL at 09/26/14 1000 .  tamsulosin (FLOMAX) capsule 0.4 mg, 0.4 mg, Oral, QPC breakfast, Toy Baker, MD, 0.4 mg at 09/26/14 0900 No Known Allergies   Objective:     BP 161/78 mmHg  Pulse 86  Temp(Src) 98.2 F (36.8 C) (Oral)  Resp 18  Ht 5\' 10"  (1.778 m)  Wt 160.5 kg (353 lb 13.4 oz)  BMI 50.77 kg/m2  SpO2 98%  He does not appear in any acute distress  Nonicteric  Heart regular rhythm no murmurs  Lungs clear  Abdomen: Bowel sounds normal, soft, nontender  Laboratory No components found for: D1    Assessment:     Post polypectomy bleed. At the present time this seems to be slowing down. He is not hemodynamically unstable.      Plan:     I would recommend continued observation. The bleeding seems to be slowing down. I will put him on clear liquids. Continue to follow hemoglobin and hematocrit. As of this moment I don't think we need to repeat a colonoscopy for therapeutic measures, but we will follow his clinical course and see if this will be needed. Lab Results  Component Value Date   HGB 10.8* 09/26/2014   HGB 11.5* 09/25/2014   HGB 13.5 11/17/2013   HCT 32.8* 09/26/2014   HCT 35.4* 09/25/2014   HCT 40.6 11/17/2013   ALKPHOS 55 09/26/2014   ALKPHOS 62 09/25/2014   ALKPHOS 64 11/17/2013   AST 51* 09/26/2014   AST 56* 09/25/2014   AST 33 11/17/2013   ALT 65* 09/26/2014   ALT 71* 09/25/2014   ALT 48 11/17/2013

## 2014-09-26 NOTE — H&P (Signed)
PCP: Gara Kroner, MD  [facility]  Chief Complaint:  Bright blood per rectum  HPI: Frank Cox is a 54 y.o. male   has a past medical history of Depression; Anxiety; CPAP (continuous positive airway pressure) dependence; Poor circulation; Hypertension; Fatigue; Diabetes mellitus; Kidney stone; Generalized headaches; Hearing loss; Contact lens/glasses fitting; Bruises easily; Obesity; Prostate cancer (02/14/14); Arthritis; Hyperlipidemia; Bipolar 1 disorder; and S/P radiation therapy (07/03/2014 through 08/28/2014                                                   ).   Presented with  1 week of dark red/black stool painless stools. For the past 24 hours he had bright red blood. 1 week ago he had  A routine colonoscopy showing 6 polyps that were removed. Result not available in EPIC, Patient have had 4-5 bloody BM each time about 1/5 a cup. Denies light headedness. Hg was 11.5 on arrival.  Patient reports Having Gastric Sleeve surgery done at [facility] for weight loss 2 years ago.   Hospitalist was called for admission for GI bleed  Review of Systems:    Pertinent positives include:  melena, blood in stool,   Constitutional:  No weight loss, night sweats, Fevers, chills, fatigue, weight loss  HEENT:  No headaches, Difficulty swallowing,Tooth/dental problems,Sore throat,  No sneezing, itching, ear ache, nasal congestion, post nasal drip,  Cardio-vascular:  No chest pain, Orthopnea, PND, anasarca, dizziness, palpitations.no Bilateral lower extremity swelling  GI:  No heartburn, indigestion, abdominal pain, nausea, vomiting, diarrhea, change in bowel habits, loss of appetite, hematemesis Resp:  no shortness of breath at rest. No dyspnea on exertion, No excess mucus, no productive cough, No non-productive cough, No coughing up of blood.No change in color of mucus.No wheezing. Skin:  no rash or lesions. No jaundice GU:  no dysuria, change in color of urine, no urgency or  frequency. No straining to urinate.  No flank pain.  Musculoskeletal:  No joint pain or no joint swelling. No decreased range of motion. No back pain.  Psych:  No change in mood or affect. No depression or anxiety. No memory loss.  Neuro: no localizing neurological complaints, no tingling, no weakness, no double vision, no gait abnormality, no slurred speech, no confusion  Otherwise ROS are negative except for above, 10 systems were reviewed  Past Medical History: Past Medical History  Diagnosis Date  . Depression   . Anxiety   . CPAP (continuous positive airway pressure) dependence   . Poor circulation   . Hypertension     boarderline - elevated  . Fatigue   . Diabetes mellitus   . Kidney stone   . Generalized headaches   . Hearing loss   . Contact lens/glasses fitting   . Bruises easily   . Obesity   . Prostate cancer 02/14/14    Gleason 4+3=7, volume 56 mL  . Arthritis   . Hyperlipidemia   . Bipolar 1 disorder     "well controlled on Lithium"  . S/P radiation therapy 07/03/2014 through 08/28/2014  Prostate 7800 cGy in 40 sessions, seminal vesicles 5600 cGy in 40 sessions                         Past Surgical History  Procedure Laterality Date  . Laparoscopic gastric banding  09/09/09  . Prostate biopsy  02/14/14    Gleason 7, volume 56 mL     Medications: Prior to Admission medications   Medication Sig Start Date End Date Taking? Authorizing Provider  acetaminophen (TYLENOL) 500 MG tablet Take 1,000 mg by mouth every 6 (six) hours as needed for mild pain.   Yes Historical Provider, MD  citalopram (CELEXA) 40 MG tablet Take 40 mg by mouth daily.     Yes Historical Provider, MD  clonazePAM (KLONOPIN) 1 MG tablet Take 3 mg by mouth at bedtime.    Yes Historical Provider, MD  glimepiride (AMARYL) 4 MG tablet Take 2 tablets (8 mg total) by mouth daily with breakfast. Patient taking differently: Take 4 mg by mouth daily  with breakfast.  11/19/13  Yes Geradine Girt, DO  lamoTRIgine (LAMICTAL) 200 MG tablet Take 200 mg by mouth at bedtime.    Yes Historical Provider, MD  lithium carbonate (ESKALITH) 450 MG CR tablet Take 1,800 mg by mouth at bedtime.    Yes Historical Provider, MD  lurasidone (LATUDA) 40 MG TABS Take 40 mg by mouth at bedtime.    Yes Historical Provider, MD  metoprolol succinate (TOPROL-XL) 25 MG 24 hr tablet Take 25 mg by mouth every morning.  04/29/11  Yes Historical Provider, MD  Multiple Vitamin (MULTIVITAMIN PO) Take 1 tablet by mouth every morning. [employer] brand Solotron Chewable.   Yes Historical Provider, MD  QUEtiapine (SEROQUEL) 100 MG tablet Take 200 mg by mouth at bedtime.  06/10/14  Yes Historical Provider, MD  tamsulosin (FLOMAX) 0.4 MG CAPS capsule Take 0.4 mg by mouth daily after breakfast.    Yes Historical Provider, MD  diphenhydrAMINE (BENADRYL) 25 MG tablet Take 50 mg by mouth every 6 (six) hours as needed for allergies.    Historical Provider, MD    Allergies:  No Known Allergies  Social History:  Ambulatory independently  Lives at home  With family     reports that he has never smoked. He has never used smokeless tobacco. He reports that he does not drink alcohol or use illicit drugs.    Family History: family history includes Alcohol abuse in his mother; Arthritis in his father and mother.    Physical Exam: Patient Vitals for the past 24 hrs:  BP Temp Temp src Pulse Resp SpO2 Height Weight  09/25/14 2332 153/69 mmHg - - 90 20 96 % - -  09/25/14 2200 156/82 mmHg 97.9 F (36.6 C) Oral 100 20 98 % 5\' 10"  (1.778 m) (!) 163.295 kg (360 lb)    1. General:  in No Acute distress 2. Psychological: Alert and   Oriented 3. Head/ENT:   Moist  Mucous Membranes                          Head Non traumatic, neck supple                          Normal   Dentition 4. SKIN:   decreased Skin turgor,  Skin clean Dry bilateral lower extremities with signs of chronic venous stasis 5.  Heart: Regular rate and rhythm no  Murmur, Rub or gallop 6. Lungs: Clear to auscultation bilaterally, no wheezes or crackles   7. Abdomen: Soft, non-tender, Non distended, obese 8. Lower extremities: no clubbing, cyanosis, or edema 9. Neurologically Grossly intact, moving all 4 extremities equally 10. MSK: Normal range of motion Hemoccult positive  body mass index is 51.65 kg/(m^2).   Labs on Admission:   Results for orders placed or performed during the hospital encounter of 09/25/14 (from the past 24 hour(s))  Urinalysis, Routine w reflex microscopic     Status: Abnormal   Collection Time: 09/25/14 10:28 PM  Result Value Ref Range   Color, Urine YELLOW YELLOW   APPearance CLEAR CLEAR   Specific Gravity, Urine 1.007 1.005 - 1.030   pH 6.0 5.0 - 8.0   Glucose, UA 100 (A) NEGATIVE mg/dL   Hgb urine dipstick NEGATIVE NEGATIVE   Bilirubin Urine NEGATIVE NEGATIVE   Ketones, ur NEGATIVE NEGATIVE mg/dL   Protein, ur 30 (A) NEGATIVE mg/dL   Urobilinogen, UA 0.2 0.0 - 1.0 mg/dL   Nitrite NEGATIVE NEGATIVE   Leukocytes, UA NEGATIVE NEGATIVE  Urine microscopic-add on     Status: None   Collection Time: 09/25/14 10:28 PM  Result Value Ref Range   Squamous Epithelial / LPF RARE RARE   WBC, UA 0-2 <3 WBC/hpf  CBC with Differential     Status: Abnormal   Collection Time: 09/25/14 10:53 PM  Result Value Ref Range   WBC 9.9 4.0 - 10.5 K/uL   RBC 3.67 (L) 4.22 - 5.81 MIL/uL   Hemoglobin 11.5 (L) 13.0 - 17.0 g/dL   HCT 35.4 (L) 39.0 - 52.0 %   MCV 96.5 78.0 - 100.0 fL   MCH 31.3 26.0 - 34.0 pg   MCHC 32.5 30.0 - 36.0 g/dL   RDW 13.7 11.5 - 15.5 %   Platelets 311 150 - 400 K/uL   Neutrophils Relative % 81 (H) 43 - 77 %   Neutro Abs 8.1 (H) 1.7 - 7.7 K/uL   Lymphocytes Relative 11 (L) 12 - 46 %   Lymphs Abs 1.1 0.7 - 4.0 K/uL   Monocytes Relative 6 3 - 12 %   Monocytes Absolute 0.6 0.1 - 1.0 K/uL   Eosinophils Relative 2 0 - 5 %   Eosinophils Absolute 0.2 0.0 - 0.7 K/uL   Basophils  Relative 0 0 - 1 %   Basophils Absolute 0.0 0.0 - 0.1 K/uL  Comprehensive metabolic panel     Status: Abnormal   Collection Time: 09/25/14 10:53 PM  Result Value Ref Range   Sodium 139 135 - 145 mmol/L   Potassium 4.1 3.5 - 5.1 mmol/L   Chloride 111 96 - 112 mEq/L   CO2 20 19 - 32 mmol/L   Glucose, Bld 291 (H) 70 - 99 mg/dL   BUN 18 6 - 23 mg/dL   Creatinine, Ser 1.38 (H) 0.50 - 1.35 mg/dL   Calcium 10.6 (H) 8.4 - 10.5 mg/dL   Total Protein 7.3 6.0 - 8.3 g/dL   Albumin 4.1 3.5 - 5.2 g/dL   AST 56 (H) 0 - 37 U/L   ALT 71 (H) 0 - 53 U/L   Alkaline Phosphatase 62 39 - 117 U/L   Total Bilirubin 0.6 0.3 - 1.2 mg/dL   GFR calc non Af Amer 57 (L) >90 mL/min   GFR calc Af Amer 66 (L) >90 mL/min   Anion gap 8 5 - 15  POC occult blood, ED Provider will collect  Status: Abnormal   Collection Time: 09/25/14 11:33 PM  Result Value Ref Range   Fecal Occult Bld POSITIVE (A) NEGATIVE    UA no UTI  Lab Results  Component Value Date   HGBA1C 7.4* 11/17/2013    Estimated Creatinine Clearance: 94.4 mL/min (by C-G formula based on Cr of 1.38).  BNP (last 3 results) No results for input(s): PROBNP in the last 8760 hours.  Other results:  I have pearsonaly reviewed this: ECG not done   [facility] Weights   09/25/14 2200  Weight: 163.295 kg (360 lb)     Cultures: No results found for: SDES, SPECREQUEST, CULT, REPTSTATUS   Radiological Exams on Admission: No results found.  Chart has been reviewed  Assessment/Plan  54 year old gentleman with history of gastric sleeve procedure presents with melena followed by bright red blood per rectum. The stable hemoglobin and hemodynamically stable. The recent colonoscopy done showing multiple polyps that has been removed Being admitted for GI bleed  Present on Admission:  . Acute lower GI bleeding - the patient remains hemodynamically stable despite history of prior melanoma which would've suggested a [place] upper GI source. It is not quite  clear. We'll continue to monitor carefully. Serial CBC. Admit to step down. GI consult in a.m. Write for Protonix drip. Patient have had recent colonoscopy with polyp removal.  . Malignant neoplasm of prostate - chronic  . Bipolar 1 disorder -continue home medications and check lithium level  . DM2 - hold by mouth meds write for sensitive sliding scale   Prophylaxis: SCD   Protonix  CODE STATUS:  FULL CODE   Other plan as per orders.  I have spent a total of 55 min on this admission  Earl Zellmer 09/26/2014, 1:13 AM  [facility]  Pager [phone]   after 2 AM please page floor coverage PA If 7AM-7PM, please contact the day team taking care of the patient  [email or web address]  Password [email or web address]

## 2014-09-26 NOTE — Progress Notes (Signed)
Pt is using home CPAP machine, nasal pillows and tubing. Pt states he does not require any assistance with CPAP or mask application. Pt was encouraged to contact RT if needed throughout the night. [employer] has been contacted and will inspect CPAP when available. RT will continue to monitor as needed.

## 2014-09-26 NOTE — ED Notes (Signed)
Pt states he went to restroom and had a dark red bloody stool

## 2014-09-26 NOTE — ED Notes (Signed)
Pt states went to restroom and had a large bloody stool

## 2014-09-27 LAB — GLUCOSE, CAPILLARY
GLUCOSE-CAPILLARY: 151 mg/dL — AB (ref 70–99)
Glucose-Capillary: 149 mg/dL — ABNORMAL HIGH (ref 70–99)
Glucose-Capillary: 195 mg/dL — ABNORMAL HIGH (ref 70–99)
Glucose-Capillary: 244 mg/dL — ABNORMAL HIGH (ref 70–99)

## 2014-09-27 LAB — CBC
HCT: 30.9 % — ABNORMAL LOW (ref 39.0–52.0)
HEMOGLOBIN: 9.8 g/dL — AB (ref 13.0–17.0)
MCH: 31.3 pg (ref 26.0–34.0)
MCHC: 31.7 g/dL (ref 30.0–36.0)
MCV: 98.7 fL (ref 78.0–100.0)
Platelets: 246 10*3/uL (ref 150–400)
RBC: 3.13 MIL/uL — ABNORMAL LOW (ref 4.22–5.81)
RDW: 14.3 % (ref 11.5–15.5)
WBC: 6.4 10*3/uL (ref 4.0–10.5)

## 2014-09-27 NOTE — Progress Notes (Addendum)
Patient ID: Frank Cox, male   DOB: [birth year], 54 y.o.   MRN: [id number]  [facility] PROGRESS NOTE  Frank Cox GQB:[id number] DOB: 1960/10/11 DOA: 09/25/2014 PCP: Gara Kroner, MD   Brief narrative:    54 year old male status post recent colonoscopy 09/18/2014 with removal of multiple colon polyps, presented now to [facility] ED with new episodes of rectal bleeding that started 3 days prior to this admission. Patient describes rectal bleeding is dark red color, no pain. He denies chest pain and shortness of breath. He denies abdominal or urinary concerns, no nausea or vomiting.  In emergency department, patient noted to be hemodynamically stable, Hg 11.5. [facility] asked to admit for further evaluation area GI consulted.  Assessment/Plan:    Active Problems: Acute on chronic blood loss anemia, secondary to polyps - post polypectomy bleeding - patient denies any bleeding overnight or this morning but hemoglobin is trending down: 11.5 --> 10.8 --> > 9.8 this morning - Follow-up on GI recommendations Diabetes mellitus type 2 - Takes Amaryl at home which we held since admission - Providing sliding scale insulin while inpatient, may resume Amaryl upon discharge Hypertension - Reasonable inpatient control, continue metoprolol the patient takes at home Bipolar disorder with depression and anxiety - Continue only p.m. as per home medical regimen - Patient also on clonazepam for anxiety which we are continuing here - Continue Seroquel and quetiapine for depression Acute renal failure - Secondary to prerenal etiology, acute blood loss anemia - IV fluids provided, creatinine is within normal limits this morning Hypercalcemia - Present on admission and secondary to dehydration from prerenal etiology - IV fluids provided as noted above and calcium is within normal limits this morning Transaminitis - Patient denies alcohol use, ? Lithium side effect  - LFTs slowly  trending down Morbid obesity with BMI greater than 40 SCD's for DVT prophylaxis  Code Status: Full.  Family Communication:  plan of care discussed with the patient Disposition Plan: Home when cleared by gastroenterology team  IV access:  Peripheral IV  Procedures and diagnostic studies:    None  Medical Consultants:  Gastroenterology - Dr. Penelope Coop  Other Consultants:  None  IAnti-Infectives:   None  Faye Ramsay, MD  [facility] Pager [phone]  If 7PM-7AM, please contact night-coverage [email or web address] Password [facility] 09/27/2014, 11:29 AM   LOS: 2 days   HPI/Subjective: No events overnight. No blood in stool since last night. No nausea or vomiting  Objective: Filed Vitals:   09/26/14 1600 09/26/14 1634 09/26/14 2009 09/27/14 0414  BP: 157/72 160/79 136/73 109/54  Pulse: 64 81 77 71  Temp:  98.4 F (36.9 C) 98.2 F (36.8 C) 98.2 F (36.8 C)  TempSrc:  Oral Oral Oral  Resp:  18 18 20   Height:      Weight:      SpO2: 93% 100% 100% 99%    Intake/Output Summary (Last 24 hours) at 09/27/14 1129 Last data filed at 09/27/14 0834  Gross per 24 hour  Intake   1750 ml  Output      0 ml  Net   1750 ml    Exam:   General:  Pt is alert, follows commands appropriately, not in acute distress  Cardiovascular: Regular rate and rhythm, S1/S2, no murmurs, no rubs, no gallops  Respiratory: Clear to auscultation bilaterally, no wheezing, no crackles, no rhonchi  Abdomen: Soft, non tender, non distended, bowel sounds present, no guarding  Extremities: No edema, pulses DP and PT palpable bilaterally  Neuro: Grossly nonfocal  Data Reviewed: Basic Metabolic Panel:  Recent Labs Lab 09/25/14 2253 09/26/14 0501  NA 139 140  K 4.1 4.3  CL 111 111  CO2 20 20  GLUCOSE 291* 221*  BUN 18 18  CREATININE 1.38* 1.32  CALCIUM 10.6* 10.2  MG  --  2.0  PHOS  --  3.7   Liver Function Tests:  Recent Labs Lab 09/25/14 2253 09/26/14 0501  AST 56* 51*  ALT 71* 65*   ALKPHOS 62 55  BILITOT 0.6 0.6  PROT 7.3 6.8  ALBUMIN 4.1 3.8   CBC:  Recent Labs Lab 09/25/14 2253 09/26/14 0501 09/27/14 0435  WBC 9.9 7.8 6.4  NEUTROABS 8.1*  --   --   HGB 11.5* 10.8* 9.8*  HCT 35.4* 32.8* 30.9*  MCV 96.5 96.5 98.7  PLT 311 248 246   CBG:  Recent Labs Lab 09/26/14 1149 09/26/14 1616 09/27/14 0004 09/27/14 0405 09/27/14 0732  GLUCAP 166* 158* 149* 151* 244*    Recent Results (from the past 240 hour(s))  MRSA PCR Screening     Status: None   Collection Time: 09/26/14  6:20 AM  Result Value Ref Range Status   MRSA by PCR NEGATIVE NEGATIVE Final    Scheduled Meds: . citalopram  40 mg Oral Daily  . clonazePAM  3 mg Oral QHS  . insulin aspart  0-9 Units Subcutaneous 6 times per day  . lamoTRIgine  200 mg Oral QHS  . lithium carbonate  1,800 mg Oral QHS  . lurasidone  40 mg Oral QHS  . metoprolol succinate  25 mg Oral Daily  . Pantoprazole IV  40 mg Intravenous Q12H  . QUEtiapine  200 mg Oral QHS  . tamsulosin  0.4 mg Oral QPC breakfast   Continuous Infusions: . sodium chloride 50 mL/hr at 09/26/14 1012

## 2014-09-27 NOTE — Progress Notes (Signed)
[facility] Progress Note  Subjective: No further signs of bleeding.  Objective: Vital signs in last 24 hours: Temp:  [98.2 F (36.8 C)-98.4 F (36.9 C)] 98.2 F (36.8 C) (01/22 0414) Pulse Rate:  [64-81] 71 (01/22 0414) Resp:  [18-20] 20 (01/22 0414) BP: (109-160)/(54-79) 109/54 mmHg (01/22 0414) SpO2:  [93 %-100 %] 99 % (01/22 0414) Weight change:    PE: No distress Heart RRR Abdomen soft and non tender.  Lab Results: Results for orders placed or performed during the hospital encounter of 09/25/14 (from the past 24 hour(s))  Glucose, capillary     Status: Abnormal   Collection Time: 09/26/14  4:16 PM  Result Value Ref Range   Glucose-Capillary 158 (H) 70 - 99 mg/dL  Glucose, capillary     Status: Abnormal   Collection Time: 09/27/14 12:04 AM  Result Value Ref Range   Glucose-Capillary 149 (H) 70 - 99 mg/dL  Glucose, capillary     Status: Abnormal   Collection Time: 09/27/14  4:05 AM  Result Value Ref Range   Glucose-Capillary 151 (H) 70 - 99 mg/dL  CBC     Status: Abnormal   Collection Time: 09/27/14  4:35 AM  Result Value Ref Range   WBC 6.4 4.0 - 10.5 K/uL   RBC 3.13 (L) 4.22 - 5.81 MIL/uL   Hemoglobin 9.8 (L) 13.0 - 17.0 g/dL   HCT 30.9 (L) 39.0 - 52.0 %   MCV 98.7 78.0 - 100.0 fL   MCH 31.3 26.0 - 34.0 pg   MCHC 31.7 30.0 - 36.0 g/dL   RDW 14.3 11.5 - 15.5 %   Platelets 246 150 - 400 K/uL  Glucose, capillary     Status: Abnormal   Collection Time: 09/27/14  7:32 AM  Result Value Ref Range   Glucose-Capillary 244 (H) 70 - 99 mg/dL    Studies/Results: No results found.    Assessment: Post polypectomy bleed. Stable. Appears to have resolved.  Plan: Regular diet. If no further bleeding I think he can go home tomorrow.    Wonda Horner 09/27/2014, 2:30 PM  Lab Results  Component Value Date   HGB 9.8* 09/27/2014   HGB 10.8* 09/26/2014   HGB 11.5* 09/25/2014   HCT 30.9* 09/27/2014   HCT 32.8* 09/26/2014   HCT 35.4* 09/25/2014   ALKPHOS 55 09/26/2014   ALKPHOS 62 09/25/2014   ALKPHOS 64 11/17/2013   AST 51* 09/26/2014   AST 56* 09/25/2014   AST 33 11/17/2013   ALT 65* 09/26/2014   ALT 71* 09/25/2014   ALT 48 11/17/2013

## 2014-09-27 NOTE — Progress Notes (Signed)
Pt is using home CPAP and nasal pillows. Pt states he does not require any assistance with CPAP or with mask application. RT provided pt with sterile water for humidification. RT will continue to monitor as needed.

## 2014-09-28 LAB — CBC
HCT: 29.4 % — ABNORMAL LOW (ref 39.0–52.0)
Hemoglobin: 9.5 g/dL — ABNORMAL LOW (ref 13.0–17.0)
MCH: 31 pg (ref 26.0–34.0)
MCHC: 32.3 g/dL (ref 30.0–36.0)
MCV: 96.1 fL (ref 78.0–100.0)
PLATELETS: 230 10*3/uL (ref 150–400)
RBC: 3.06 MIL/uL — AB (ref 4.22–5.81)
RDW: 13.8 % (ref 11.5–15.5)
WBC: 6.1 10*3/uL (ref 4.0–10.5)

## 2014-09-28 LAB — GLUCOSE, CAPILLARY
GLUCOSE-CAPILLARY: 310 mg/dL — AB (ref 70–99)
Glucose-Capillary: 213 mg/dL — ABNORMAL HIGH (ref 70–99)
Glucose-Capillary: 184 mg/dL — ABNORMAL HIGH (ref 70–99)

## 2014-09-28 MED ORDER — GLIMEPIRIDE 4 MG PO TABS
8.0000 mg | ORAL_TABLET | Freq: Every day | ORAL | Status: DC
  Administered 2014-09-29: 8 mg via ORAL
  Filled 2014-09-28 (×2): qty 2

## 2014-09-28 NOTE — Progress Notes (Signed)
Patient ID: Frank Cox, male   DOB: June 17, 1961, 54 y.o.   MRN: [id number]  [facility] PROGRESS NOTE  Frank Cox KGM:[id number] DOB: 1961/03/13 DOA: 09/25/2014 PCP: Frank Kroner, MD   Brief narrative:    54 year old male status post recent colonoscopy 09/18/2014 with removal of multiple colon polyps, presented now to [facility] ED with new episodes of rectal bleeding that started 3 days prior to this admission. Patient describes rectal bleeding is dark red color, no pain. He denies chest pain and shortness of breath. He denies abdominal or urinary concerns, no nausea or vomiting.  In emergency department, patient noted to be hemodynamically stable, Hg 11.5. [facility] hospitalist asked to admit for further evaluation area GI consulted.  Assessment/Plan:    Active Problems: Acute on chronic blood loss anemia, secondary to polyps - post polypectomy bleeding - patient denies any bleeding for 48 hours, Hg remains overall stable  - Per GI, OK to d/c home, pending weather conditions  - plan d/c in AM - will repeat CBC again in AM Diabetes mellitus type 2 - resume Amaryl today 1/22 per home medical regimen  - continue SSI as well while inpatient  Hypertension - Reasonable inpatient control, continue metoprolol that patient takes at home Bipolar disorder with depression and anxiety - Continue lithium as per home medical regimen - Patient also on clonazepam for anxiety which we are continuing here - Continue Seroquel and quetiapine for depression Acute renal failure - Secondary to prerenal etiology, acute blood loss anemia - IV fluids provided, creatinine was within normal limits 1/21 - no need for IVF Hypercalcemia - Present on admission and secondary to dehydration from prerenal etiology - IV fluids provided as noted above and calcium was WNL 1/21 Transaminitis - Patient denies alcohol use, ? Lithium side effect  - LFTs slowly trending down Morbid obesity with BMI  greater than 40 SCD's for DVT prophylaxis  Code Status: Full.  Family Communication: plan of care discussed with the patient Disposition Plan: D/C home in AM  IV access:  Peripheral IV  Procedures and diagnostic studies:   None  Medical Consultants:  Gastroenterology - Dr. Penelope Coop  Other Consultants:  None  IAnti-Infectives:   None   Faye Ramsay, MD  [facility] Pager [phone]  If 7PM-7AM, please contact night-coverage [email or web address] Password [facility] 09/28/2014, 12:35 PM   LOS: 3 days   HPI/Subjective: No events overnight.   Objective: Filed Vitals:   09/27/14 0414 09/27/14 1522 09/27/14 2112 09/28/14 0532  BP: 109/54 121/67 135/69 101/60  Pulse: 71 74 79 59  Temp: 98.2 F (36.8 C) 98.9 F (37.2 C) 98.6 F (37 C) 98.5 F (36.9 C)  TempSrc: Oral Oral Oral Oral  Resp: 20 18 20 18   Height:      Weight:      SpO2: 99% 99% 98% 97%    Intake/Output Summary (Last 24 hours) at 09/28/14 1235 Last data filed at 09/28/14 0851  Gross per 24 hour  Intake    840 ml  Output      0 ml  Net    840 ml    Exam:   General:  Pt is alert, follows commands appropriately, not in acute distress  Cardiovascular: Regular rate and rhythm, S1/S2, no murmurs, no rubs, no gallops  Respiratory: Clear to auscultation bilaterally, no wheezing, no crackles, no rhonchi  Abdomen: Soft, non tender, non distended, bowel sounds present, no guarding  Extremities: No edema, pulses DP and PT palpable bilaterally  Neuro: Grossly  nonfocal  Data Reviewed: Basic Metabolic Panel:  Recent Labs Lab 09/25/14 2253 09/26/14 0501  NA 139 140  K 4.1 4.3  CL 111 111  CO2 20 20  GLUCOSE 291* 221*  BUN 18 18  CREATININE 1.38* 1.32  CALCIUM 10.6* 10.2  MG  --  2.0  PHOS  --  3.7   Liver Function Tests:  Recent Labs Lab 09/25/14 2253 09/26/14 0501  AST 56* 51*  ALT 71* 65*  ALKPHOS 62 55  BILITOT 0.6 0.6  PROT 7.3 6.8  ALBUMIN 4.1 3.8   CBC:  Recent  Labs Lab 09/25/14 2253 09/26/14 0501 09/27/14 0435 09/28/14 0503  WBC 9.9 7.8 6.4 6.1  NEUTROABS 8.1*  --   --   --   HGB 11.5* 10.8* 9.8* 9.5*  HCT 35.4* 32.8* 30.9* 29.4*  MCV 96.5 96.5 98.7 96.1  PLT 311 248 246 230   CBG:  Recent Labs Lab 09/26/14 1616 09/27/14 0004 09/27/14 0405 09/27/14 0732 09/27/14 1632  GLUCAP 158* 149* 151* 244* 195*    Recent Results (from the past 240 hour(s))  MRSA PCR Screening     Status: None   Collection Time: 09/26/14  6:20 AM  Result Value Ref Range Status   MRSA by PCR NEGATIVE NEGATIVE Final    Comment:        The GeneXpert MRSA Assay (FDA approved for NASAL specimens only), is one component of a comprehensive MRSA colonization surveillance program. It is not intended to diagnose MRSA infection nor to guide or monitor treatment for MRSA infections.      Scheduled Meds: . citalopram  40 mg Oral Daily  . clonazePAM  3 mg Oral QHS  . insulin aspart  0-9 Units Subcutaneous 6 times per day  . lamoTRIgine  200 mg Oral QHS  . lithium carbonate  1,800 mg Oral QHS  . lurasidone  40 mg Oral QHS  . metoprolol succinate  25 mg Oral Daily  . pantoprazole (PROTONIX) IV  40 mg Intravenous Q12H  . QUEtiapine  200 mg Oral QHS  . sodium chloride  3 mL Intravenous Q12H  . tamsulosin  0.4 mg Oral QPC breakfast   Continuous Infusions:

## 2014-09-28 NOTE — Progress Notes (Signed)
Subjective: No bleeding in nearly 48 hours. No abdominal pain. Tolerating diet.  Objective: Vital signs in last 24 hours: Temp:  [98.5 F (36.9 C)-98.9 F (37.2 C)] 98.5 F (36.9 C) (01/23 0532) Pulse Rate:  [59-79] 59 (01/23 0532) Resp:  [18-20] 18 (01/23 0532) BP: (101-135)/(60-69) 101/60 mmHg (01/23 0532) SpO2:  [97 %-99 %] 97 % (01/23 0532) Weight change:  Last BM Date: 09/27/14  PE: GEN:  Obese, NAD ABD:  Protuberant, obese, soft, non-tender  Lab Results: CBC    Component Value Date/Time   WBC 6.1 09/28/2014 0503   RBC 3.06* 09/28/2014 0503   HGB 9.5* 09/28/2014 0503   HCT 29.4* 09/28/2014 0503   PLT 230 09/28/2014 0503   MCV 96.1 09/28/2014 0503   MCH 31.0 09/28/2014 0503   MCHC 32.3 09/28/2014 0503   RDW 13.8 09/28/2014 0503   LYMPHSABS 1.1 09/25/2014 2253   MONOABS 0.6 09/25/2014 2253   EOSABS 0.2 09/25/2014 2253   BASOSABS 0.0 09/25/2014 2253   Assessment:  1.  Post-polypectomy bleeding, resolved. 2.  Acute blood loss anemia.  Plan:  1.  Advance diet to regular as tolerated. 2.  OK to discharge home from GI perspective, once weather conditions permit. 3.  Will sign-off; please call with questions; thank you for the consult.   Landry Dyke 09/28/2014, 11:32 AM

## 2014-09-29 LAB — CBC
HCT: 30.3 % — ABNORMAL LOW (ref 39.0–52.0)
Hemoglobin: 9.8 g/dL — ABNORMAL LOW (ref 13.0–17.0)
MCH: 31.2 pg (ref 26.0–34.0)
MCHC: 32.3 g/dL (ref 30.0–36.0)
MCV: 96.5 fL (ref 78.0–100.0)
Platelets: 244 10*3/uL (ref 150–400)
RBC: 3.14 MIL/uL — ABNORMAL LOW (ref 4.22–5.81)
RDW: 13.9 % (ref 11.5–15.5)
WBC: 6.6 10*3/uL (ref 4.0–10.5)

## 2014-09-29 LAB — GLUCOSE, CAPILLARY
GLUCOSE-CAPILLARY: 163 mg/dL — AB (ref 70–99)
GLUCOSE-CAPILLARY: 171 mg/dL — AB (ref 70–99)
GLUCOSE-CAPILLARY: 172 mg/dL — AB (ref 70–99)
Glucose-Capillary: 154 mg/dL — ABNORMAL HIGH (ref 70–99)
Glucose-Capillary: 159 mg/dL — ABNORMAL HIGH (ref 70–99)
Glucose-Capillary: 164 mg/dL — ABNORMAL HIGH (ref 70–99)
Glucose-Capillary: 170 mg/dL — ABNORMAL HIGH (ref 70–99)
Glucose-Capillary: 220 mg/dL — ABNORMAL HIGH (ref 70–99)
Glucose-Capillary: 229 mg/dL — ABNORMAL HIGH (ref 70–99)
Glucose-Capillary: 246 mg/dL — ABNORMAL HIGH (ref 70–99)

## 2014-09-29 NOTE — Discharge Instructions (Signed)
Bloody Stools  Bloody stools often mean that there is a problem in the digestive tract. Your caregiver may use the term "melena" to describe black, tarry, and bad smelling stools or "hematochezia" to describe red or maroon-colored stools. Blood seen in the stool can be caused by bleeding anywhere along the intestinal tract.   A black stool usually means that blood is coming from the upper part of the gastrointestinal tract (esophagus, stomach, or small bowel). Passing maroon-colored stools or bright red blood usually means that blood is coming from lower down in the large bowel or the rectum. However, sometimes massive bleeding in the stomach or small intestine can cause bright red bloody stools.   Consuming black licorice, lead, iron pills, medicines containing bismuth subsalicylate, or blueberries can also cause black stools. Your caregiver can test black stools to see if blood is present.  It is important that the cause of the bleeding be found. Treatment can then be started, and the problem can be corrected. Rectal bleeding may not be serious, but you should not assume everything is okay until you know the cause. It is very important to follow up with your caregiver or a specialist in gastrointestinal problems.  CAUSES   Blood in the stools can come from various underlying causes. Often, the cause is not found during your first visit. Testing is often needed to discover the cause of bleeding in the gastrointestinal tract. Causes range from simple to serious or even life-threatening. Possible causes include:  · Hemorrhoids. These are veins that are full of blood (engorged) in the rectum. They cause pain, inflammation, and may bleed.  · Anal fissures. These are areas of painful tearing which may bleed. They are often caused by passing hard stool.  · Diverticulosis. These are pouches that form on the colon over time, with age, and may bleed significantly.  · Diverticulitis. This is inflammation in areas with  diverticulosis. It can cause pain, fever, and bloody stools, although bleeding is rare.  · Proctitis and colitis. These are inflamed areas of the rectum or colon. They may cause pain, fever, and bloody stools.  · Polyps and cancer. Colon cancer is a leading cause of preventable cancer death. It often starts out as precancerous polyps that can be removed during a colonoscopy, preventing progression into cancer. Sometimes, polyps and cancer may cause rectal bleeding.  · Gastritis and ulcers. Bleeding from the upper gastrointestinal tract (near the stomach) may travel through the intestines and produce black, sometimes tarry, often bad smelling stools. In certain cases, if the bleeding is fast enough, the stools may not be black, but red and the condition may be life-threatening.  SYMPTOMS   You may have stools that are bright red and bloody, that are normal color with blood on them, or that are dark black and tarry. In some cases, you may only have blood in the toilet bowl. Any of these cases need medical care. You may also have:  · Pain at the anus or anywhere in the rectum.  · Lightheadedness or feeling faint.  · Extreme weakness.  · Nausea or vomiting.  · Fever.  DIAGNOSIS  Your caregiver may use the following methods to find the cause of your bleeding:  · Taking a medical history. Age is important. Older people tend to develop polyps and cancer more often. If there is anal pain and a hard, large stool associated with bleeding, a tear of the anus may be the cause. If blood drips into the toilet after a bowel movement, bleeding hemorrhoids may be the   problem. The color and frequency of the bleeding are additional considerations. In most cases, the medical history provides clues, but seldom the final answer.  · A visual and finger (digital) exam. Your caregiver will inspect the anal area, looking for tears and hemorrhoids. A finger exam can provide information when there is tenderness or a growth inside. In men, the  prostate is also examined.  · Endoscopy. Several types of small, long scopes (endoscopes) are used to view the colon.  ¨ In the office, your caregiver may use a rigid, or more commonly, a flexible viewing sigmoidoscope. This exam is called flexible sigmoidoscopy. It is performed in 5 to 10 minutes.  ¨ A more thorough exam is accomplished with a colonoscope. It allows your caregiver to view the entire 5 to 6 foot long colon. Medicine to help you relax (sedative) is usually given for this exam. Frequently, a bleeding lesion may be present beyond the reach of the sigmoidoscope. So, a colonoscopy may be the best exam to start with. Both exams are usually done on an outpatient basis. This means the patient does not stay overnight in the hospital or surgery center.  ¨ An upper endoscopy may be needed to examine your stomach. Sedation is used and a flexible endoscope is put in your mouth, down to your stomach.  · A barium enema X-ray. This is an X-ray exam. It uses liquid barium inserted by enema into the rectum. This test alone may not identify an actual bleeding point. X-rays highlight abnormal shadows, such as those made by lumps (tumors), diverticuli, or colitis.  TREATMENT   Treatment depends on the cause of your bleeding.   · For bleeding from the stomach or colon, the caregiver doing your endoscopy or colonoscopy may be able to stop the bleeding as part of the procedure.  · Inflammation or infection of the colon can be treated with medicines.  · Many rectal problems can be treated with creams, suppositories, or warm baths.  · Surgery is sometimes needed.  · Blood transfusions are sometimes needed if you have lost a lot of blood.  · For any bleeding problem, let your caregiver know if you take aspirin or other blood thinners regularly.  HOME CARE INSTRUCTIONS   · Take any medicines exactly as prescribed.  · Keep your stools soft by eating a diet high in fiber. Prunes (1 to 3 a day) work well for many people.  · Drink  enough water and fluids to keep your urine clear or pale yellow.  · Take sitz baths if advised. A sitz bath is when you sit in a bathtub with warm water for 10 to 15 minutes to soak, soothe, and cleanse the rectal area.  · If enemas or suppositories are advised, be sure you know how to use them. Tell your caregiver if you have problems with this.  · Monitor your bowel movements to look for signs of improvement or worsening.  SEEK MEDICAL CARE IF:   · You do not improve in the time expected.  · Your condition worsens after initial improvement.  · You develop any new symptoms.  SEEK IMMEDIATE MEDICAL CARE IF:   · You develop severe or prolonged rectal bleeding.  · You vomit blood.  · You feel weak or faint.  · You have a fever.  MAKE SURE YOU:  · Understand these instructions.  · Will watch your condition.  · Will get help right away if you are not doing well or get worse.    Document Released: 08/13/2002 Document Revised: 11/15/2011 Document Reviewed: 01/08/2011  ExitCare® Patient Information ©2015 [employer]. This information is not intended to replace advice given to you by your health care provider. Make sure you discuss any questions you have with your health care provider.

## 2014-09-29 NOTE — Progress Notes (Signed)
Pt left at this time with his father. Pt alert, oriented and without c/o. Discharge instructions given/explained with pt verbalizing understanding.  Followup appointment noted with PCP.

## 2014-09-29 NOTE — Discharge Summary (Signed)
Physician Discharge Summary  Frank Cox III YBW:[id number] DOB: 26-Jul-1961 DOA: 09/25/2014  PCP: Gara Kroner, MD  Admit date: 09/25/2014 Discharge date: 09/29/2014  Recommendations for Outpatient Follow-up:  1. Pt will need to follow up with PCP in 2-3 weeks post discharge 2. Please obtain BMP to evaluate electrolytes and kidney function 3. Please also check CBC to evaluate Hg and Hct levels  Discharge Diagnoses:  Active Problems:   Diabetes mellitus, type II   Depression   CPAP (continuous positive airway pressure) dependence   Malignant neoplasm of prostate   Acute lower GI bleeding   Bipolar 1 disorder   GI bleed   Discharge Condition: Stable  Diet recommendation: Heart healthy diet discussed in details   Brief narrative:    54 year old male status post recent colonoscopy 09/18/2014 with removal of multiple colon polyps, presented now to [facility] ED with new episodes of rectal bleeding that started 3 days prior to this admission. Patient describes rectal bleeding is dark red color, no pain. He denies chest pain and shortness of breath. He denies abdominal or urinary concerns, no nausea or vomiting.  In emergency department, patient noted to be hemodynamically stable, Hg 11.5. [facility] hospitalist asked to admit for further evaluation area GI consulted.  Assessment/Plan:    Active Problems: Acute on chronic blood loss anemia, secondary to polyps - post polypectomy bleeding - patient denies any bleeding for 72 hours, Hg remains overall stable  - Per GI, OK to d/c home Diabetes mellitus type 2 - resume Amaryl upon discharge  Hypertension - Reasonable inpatient control, continue metoprolol  Bipolar disorder with depression and anxiety - Continue lithium as per home medical regimen - Patient also on clonazepam for anxiety  - Continue Seroquel and quetiapine for depression Acute renal failure - Secondary to prerenal etiology, acute blood loss anemia - IV fluids  provided, creatinine was within normal limits 1/21 Hypercalcemia - Present on admission and secondary to dehydration from prerenal etiology - IV fluids provided as noted above and calcium was WNL 1/21 Transaminitis - Patient denies alcohol use, ? Lithium side effect  - LFTs slowly trending down Morbid obesity with BMI greater than 40  Code Status: Full.  Family Communication: plan of care discussed with the patient and father at bedside  Disposition Plan: D/C home  IV access:  Peripheral IV  Procedures and diagnostic studies:   None  Medical Consultants:  Gastroenterology - Dr. Penelope Coop  Other Consultants:  None  IAnti-Infectives:   None   Discharge Exam: Filed Vitals:   09/29/14 0500  BP: 126/53  Pulse: 60  Temp: 98.2 F (36.8 C)  Resp: 17   Filed Vitals:   09/28/14 0532 09/28/14 1428 09/29/14 0042 09/29/14 0500  BP: 101/60 130/67 123/67 126/53  Pulse: 59 80 75 60  Temp: 98.5 F (36.9 C) 98.6 F (37 C) 98.2 F (36.8 C) 98.2 F (36.8 C)  TempSrc: Oral Tympanic Oral Oral  Resp: 18 18 17 17   Height:      Weight:      SpO2: 97% 99% 98% 98%    General: Pt is alert, follows commands appropriately, not in acute distress Cardiovascular: Regular rate and rhythm, S1/S2 +, no murmurs, no rubs, no gallops Respiratory: Clear to auscultation bilaterally, no wheezing, no crackles, no rhonchi Abdominal: Soft, non tender, non distended, bowel sounds +, no guarding Extremities: no edema, no cyanosis, pulses palpable bilaterally DP and PT Neuro: Grossly nonfocal  Discharge Instructions  Discharge Instructions    Diet - low  sodium heart healthy    Complete by:  As directed      Increase activity slowly    Complete by:  As directed             Medication List    TAKE these medications        acetaminophen 500 MG tablet  Commonly known as:  TYLENOL  Take 1,000 mg by mouth every 6 (six) hours as needed for mild pain.     citalopram 40 MG  tablet  Commonly known as:  CELEXA  Take 40 mg by mouth daily.     clonazePAM 1 MG tablet  Commonly known as:  KLONOPIN  Take 3 mg by mouth at bedtime.     diphenhydrAMINE 25 MG tablet  Commonly known as:  BENADRYL  Take 50 mg by mouth every 6 (six) hours as needed for allergies.     glimepiride 4 MG tablet  Commonly known as:  AMARYL  Take 2 tablets (8 mg total) by mouth daily with breakfast.     lamoTRIgine 200 MG tablet  Commonly known as:  LAMICTAL  Take 200 mg by mouth at bedtime.     LATUDA 40 MG Tabs tablet  Generic drug:  lurasidone  Take 40 mg by mouth at bedtime.     lithium carbonate 450 MG CR tablet  Commonly known as:  ESKALITH  Take 1,800 mg by mouth at bedtime.     metoprolol succinate 25 MG 24 hr tablet  Commonly known as:  TOPROL-XL  Take 25 mg by mouth every morning.     MULTIVITAMIN PO  Take 1 tablet by mouth every morning. [employer] brand Solotron Chewable.     QUEtiapine 100 MG tablet  Commonly known as:  SEROQUEL  Take 200 mg by mouth at bedtime.     tamsulosin 0.4 MG Caps capsule  Commonly known as:  FLOMAX  Take 0.4 mg by mouth daily after breakfast.           Follow-up Information    Follow up with Gara Kroner, MD.   Specialty:  Family Medicine   Contact information:   [address] [place] [address] [phone]        The results of significant diagnostics from this hospitalization (including imaging, microbiology, ancillary and laboratory) are listed below for reference.     Microbiology: Recent Results (from the past 240 hour(s))  MRSA PCR Screening     Status: None   Collection Time: 09/26/14  6:20 AM  Result Value Ref Range Status   MRSA by PCR NEGATIVE NEGATIVE Final    Comment:        The GeneXpert MRSA Assay (FDA approved for NASAL specimens only), is one component of a comprehensive MRSA colonization surveillance program. It is not intended to diagnose MRSA infection nor to guide or monitor  treatment for MRSA infections.      Labs: Basic Metabolic Panel:  Recent Labs Lab 09/25/14 2253 09/26/14 0501  NA 139 140  K 4.1 4.3  CL 111 111  CO2 20 20  GLUCOSE 291* 221*  BUN 18 18  CREATININE 1.38* 1.32  CALCIUM 10.6* 10.2  MG  --  2.0  PHOS  --  3.7   Liver Function Tests:  Recent Labs Lab 09/25/14 2253 09/26/14 0501  AST 56* 51*  ALT 71* 65*  ALKPHOS 62 55  BILITOT 0.6 0.6  PROT 7.3 6.8  ALBUMIN 4.1 3.8   CBC:  Recent Labs  Lab 09/25/14 2253 09/26/14 0501 09/27/14 0435 09/28/14 0503 09/29/14 0513  WBC 9.9 7.8 6.4 6.1 6.6  NEUTROABS 8.1*  --   --   --   --   HGB 11.5* 10.8* 9.8* 9.5* 9.8*  HCT 35.4* 32.8* 30.9* 29.4* 30.3*  MCV 96.5 96.5 98.7 96.1 96.5  PLT 311 248 246 230 244   CBG:  Recent Labs Lab 09/28/14 1812 09/28/14 1951 09/29/14 0036 09/29/14 0441 09/29/14 0737  GLUCAP 213* 184* 170* 164* 171*   SIGNED: Time coordinating discharge: Over 30 minutes  MAGICK-Margarett Viti, MD  [facility] 09/29/2014, 9:40 AM Pager [phone]  If 7PM-7AM, please contact night-coverage [email or web address] Password [email or web address]

## 2015-01-31 ENCOUNTER — Other Ambulatory Visit: Payer: Self-pay | Admitting: Family Medicine

## 2015-01-31 DIAGNOSIS — R945 Abnormal results of liver function studies: Principal | ICD-10-CM

## 2015-01-31 DIAGNOSIS — R7989 Other specified abnormal findings of blood chemistry: Secondary | ICD-10-CM

## 2015-02-13 ENCOUNTER — Other Ambulatory Visit: Payer: Self-pay | Admitting: Family Medicine

## 2015-02-13 ENCOUNTER — Ambulatory Visit
Admission: RE | Admit: 2015-02-13 | Discharge: 2015-02-13 | Disposition: A | Discharge status: Home / Self Care | Payer: 59 | Source: Ambulatory Visit | Attending: Family Medicine | Admitting: Family Medicine

## 2015-02-13 DIAGNOSIS — R945 Abnormal results of liver function studies: Principal | ICD-10-CM

## 2015-02-13 DIAGNOSIS — R7989 Other specified abnormal findings of blood chemistry: Secondary | ICD-10-CM

## 2015-03-03 ENCOUNTER — Other Ambulatory Visit: Payer: Self-pay

## 2015-08-25 LAB — HEMOGLOBIN A1C: HEMOGLOBIN A1C: 9.9

## 2015-09-22 ENCOUNTER — Ambulatory Visit: Payer: 59 | Admitting: Endocrinology

## 2015-10-06 ENCOUNTER — Ambulatory Visit: Payer: 59 | Admitting: Endocrinology

## 2015-10-22 ENCOUNTER — Ambulatory Visit: Payer: 59 | Admitting: Endocrinology

## 2015-10-23 ENCOUNTER — Other Ambulatory Visit: Payer: Self-pay | Admitting: *Deleted

## 2015-10-23 ENCOUNTER — Encounter: Payer: Self-pay | Admitting: Endocrinology

## 2015-10-23 ENCOUNTER — Ambulatory Visit (INDEPENDENT_AMBULATORY_CARE_PROVIDER_SITE_OTHER): Payer: 59 | Admitting: Endocrinology

## 2015-10-23 VITALS — BP 138/80 | HR 79 | Temp 98.6°F | Resp 16 | Ht 70.0 in | Wt 347.0 lb

## 2015-10-23 DIAGNOSIS — E1165 Type 2 diabetes mellitus with hyperglycemia: Secondary | ICD-10-CM | POA: Diagnosis not present

## 2015-10-23 DIAGNOSIS — E119 Type 2 diabetes mellitus without complications: Secondary | ICD-10-CM

## 2015-10-23 LAB — POCT GLUCOSE (DEVICE FOR HOME USE): GLUCOSE FASTING, POC: 399 mg/dL — AB (ref 70–99)

## 2015-10-23 MED ORDER — CANAGLIFLOZIN-METFORMIN HCL ER 50-1000 MG PO TB24: 50.0000 mg | ORAL_TABLET | Freq: Two times a day (BID) | ORAL | Status: DC

## 2015-10-23 MED ORDER — BAYER MICROLET LANCETS MISC: Status: AC

## 2015-10-23 MED ORDER — DULAGLUTIDE 0.75 MG/0.5ML ~~LOC~~ SOAJ: SUBCUTANEOUS | Status: DC

## 2015-10-23 MED ORDER — GLUCOSE BLOOD VI STRP: ORAL_STRIP | Status: DC

## 2015-10-23 NOTE — Progress Notes (Signed)
Patient ID: Frank Cox, male   DOB: December 20, 1960, 55 y.o.   MRN: [id number]           Reason for Appointment: Consultation for Type 2 Diabetes  Referring physician: Moreen Fowler  History of Present Illness:          Date of diagnosis of type 2 diabetes mellitus:   2010       Background history:   At the time of diagnosis he was treated with metformin which she thinks she took for about 3 years This was stopped when he had his gastric sleeve procedure in 2013; however he lost only about 40-50 pounds with this; his blood sugars did come back to normal After a few years his blood sugars started increasing and he was started on Amaryl His A1c was ranging from 7-7.4 in 2015 and 2016 although from outside lab was 6.5 in 11/15  Recent history:      His blood sugars appear to be progressively higher since about 09/2014 with rising A1c  Current management, blood sugar patterns and problems identified:    he is currently taking only Amaryl, 8 mg daily in the morning  He has not had any diabetes education and is not following a diet, does try to avoid high fat meals and things sugar  He says he has been feeling tired for the last 6 months  Has had increased thirst and urination as well as some nocturia  He has been losing weight in the last 3-4 months without trying  Most of his blood sugars have been higher in the last 3-4 months, he is checking blood sugars only once a day in the morning  Non-insulin hypoglycemic drugs the patient is taking are:     Amaryl 8 mg daily   Side effects from medications have been: None  Compliance with the medical regimen: Fair  Glucose monitoring:  done 1  times a day         Glucometer: True Touch.      Blood Glucose readings by recall  250-300 in am  Self-care: The diet that the patient has been following is: none He tries to avoid drinks which sugar.  Usually eating at home in the evenings   Dietician visit, most recent: never                 Exercise:  none  Weight history:   Wt Readings from Last 3 Encounters:  10/23/15 347 lb (157.398 kg)  09/26/14 353 lb 13.4 oz (160.5 kg)  09/24/14 336 lb 9.6 oz (152.681 kg)    Glycemic control:   Lab Results  Component Value Date   HGBA1C 9.9 08/25/2015   HGBA1C 7.2* 09/26/2014   HGBA1C 7.4* 11/17/2013   Lab Results  Component Value Date   CREATININE 1.32 09/26/2014         Medication List       This list is accurate as of: 10/23/15  4:59 PM.  Always use your most recent med list.               acetaminophen 500 MG tablet  Commonly known as:  TYLENOL  Take 1,000 mg by mouth every 6 (six) hours as needed for mild pain.     BAYER MICROLET LANCETS lancets  Use as instructed to check blood sugar 2 times per day dx code E11.9     Canagliflozin-Metformin HCl ER 50-1000 MG Tb24  Commonly known as:  INVOKAMET XR  Take 50-1,000  mg by mouth 2 (two) times daily.     citalopram 40 MG tablet  Commonly known as:  CELEXA  Take 40 mg by mouth daily.     clonazePAM 1 MG tablet  Commonly known as:  KLONOPIN  Take 3 mg by mouth at bedtime.     diphenhydrAMINE 25 MG tablet  Commonly known as:  BENADRYL  Take 50 mg by mouth every 6 (six) hours as needed for allergies.     Dulaglutide 0.75 MG/0.5ML Sopn  Commonly known as:  TRULICITY  Inject the contents of one pen once per week     glimepiride 4 MG tablet  Commonly known as:  AMARYL  Take 2 tablets (8 mg total) by mouth daily with breakfast.     glucose blood test strip  Commonly known as:  BAYER CONTOUR NEXT TEST  Use as instructed to check blood sugar 2 times daily dx code E11.9     lamoTRIgine 200 MG tablet  Commonly known as:  LAMICTAL  Take 200 mg by mouth at bedtime.     LATUDA 40 MG Tabs tablet  Generic drug:  lurasidone  Take 40 mg by mouth at bedtime.     lithium carbonate 450 MG CR tablet  Commonly known as:  ESKALITH  Take 1,800 mg by mouth at bedtime.     metoprolol succinate 25 MG 24 hr  tablet  Commonly known as:  TOPROL-XL  Take 25 mg by mouth every morning.     MULTIVITAMIN PO  Take 1 tablet by mouth every morning. [employer] brand Solotron Chewable.     QUEtiapine 100 MG tablet  Commonly known as:  SEROQUEL  Take 200 mg by mouth at bedtime.     tamsulosin 0.4 MG Caps capsule  Commonly known as:  FLOMAX  Take 0.4 mg by mouth daily after breakfast.        Allergies: No Known Allergies  Past Medical History  Diagnosis Date  . Depression   . Anxiety   . CPAP (continuous positive airway pressure) dependence   . Poor circulation   . Hypertension     boarderline - elevated  . Fatigue   . Diabetes mellitus   . Kidney stone   . Generalized headaches   . Hearing loss   . Contact lens/glasses fitting   . Bruises easily   . Obesity   . Prostate cancer ([place]) 02/14/14    Gleason 4+3=7, volume 56 mL  . Arthritis   . Hyperlipidemia   . Bipolar 1 disorder ([place])     "well controlled on Lithium"  . S/P radiation therapy 07/03/2014 through 08/28/2014                                                        Prostate 7800 cGy in 40 sessions, seminal vesicles 5600 cGy in 40 sessions                          Past Surgical History  Procedure Laterality Date  . Laparoscopic gastric banding  09/09/09  . Prostate biopsy  02/14/14    Gleason 7, volume 56 mL    Family History  Problem Relation Age of Onset  . Arthritis Mother   . Alcohol abuse Mother   . Arthritis Father  Social History:  reports that he has never smoked. He has never used smokeless tobacco. He reports that he does not drink alcohol or use illicit drugs.    Review of Systems     Lipid history: LDL was 55 in 12/16, was on Lipitor then, this was stopped because of abnormal liver functions   No results found for: CHOL, HDL, LDLCALC, LDLDIRECT, TRIG, CHOLHDL          Most recent eye exam was 2016, reportedly normal  Most recent foot exam: 10/2015  Review of Systems  Constitutional: Positive for  weight loss and malaise.       Has lost about 20 pounds in the last 4 months  HENT: Negative for trouble swallowing.   Eyes: Negative for blurred vision.  Respiratory: Negative for shortness of breath.   Cardiovascular: Negative for chest pain and leg swelling.  Gastrointestinal: Negative for diarrhea.  Endocrine: Positive for fatigue and polydipsia.       6 mo tired  Genitourinary: Positive for frequency and nocturia.       Nocturia 2-4 times  Musculoskeletal: Negative for joint pain and back pain.  Skin: Positive for rash.  Neurological: Negative for numbness, tingling and balance difficulty.  Psychiatric/Behavioral:       Long-standing bipolar disorder managed with lithium for over 20 years.  Stable medications otherwise for 5 yrs and he thinks his symptoms are controlled        Physical Examination:  BP 138/80 mmHg  Pulse 79  Temp(Src) 98.6 F (37 C)  Resp 16  Ht 5\' 10"  (1.778 m)  Wt 347 lb (157.398 kg)  BMI 49.79 kg/m2  SpO2 95%  GENERAL:         Patient has generalized obesity.   HEENT:         Eye exam shows normal external appearance. Fundus exam shows no retinopathy. Oral exam shows normal mucosa .  NECK:   There is no lymphadenopathy Thyroid is not enlarged and no nodules felt.  Carotids are normal to palpation and no bruit heard LUNGS:         Chest is symmetrical. Lungs are clear to auscultation.Marland Kitchen   HEART:         Heart sounds:  S1 and S2 are normal. No murmur or click heard., no S3 or S4.   ABDOMEN:   There is no distention present. Liver and spleen are not palpable. No other mass or tenderness present.   NEUROLOGICAL:   Ankle jerks are absent bilaterally.    Diabetic Foot Exam - Simple   Simple Foot Form  Diabetic Foot exam was performed with the following findings:  Yes 10/23/2015  3:03 PM  Visual Inspection  No deformities, no ulcerations, no other skin breakdown bilaterally:  Yes  See comments:  Yes  Sensation Testing  See comments:  Yes  Pulse Check   Posterior Tibialis and Dorsalis pulse intact bilaterally:  Yes  Comments  Dry scaliness of skin on feet and lower legs Monofilament sensation mildly decreased in the right toes and moderately on the left              Vibration sense is moderately reduced in distal first toes. MUSCULOSKELETAL:  There is no swelling or deformity of the peripheral joints. Spine is normal to inspection.   EXTREMITIES:     There is no edema. No skin lesions present except on lower legs and feet where he has has dryness, scaling and ichthyosis.Marland Kitchen SKIN:  No rash or lesions of concern.        ASSESSMENT:  Diabetes type 2, uncontrolled with BMI nearly 50 He has had progressive worsening of his diabetes control since late 2015 Although he may have significant amount of insulin deficiency has only been tried on Amaryl monotherapy in the last few years Also has had minimal diabetes education Currently not doing any exercise program partly because of fatigue Blood sugars are significantly high and he is somewhat symptomatic with blood sugars around 300   Most recent 123456 9.9  Complications: Appears to have peripheral neuropathy.  Has had increased urine microalbumin.  No reported retinopathy  Multiple other medical problems including sleep apnea, bipolar depression, long-standing, probable fatty liver, history of prostate cancer  OBESITY: He has failed LAP-BAND procedure and gastric sleeve procedures, recently has lost weight but likely to be from his significant hyperglycemia  PLAN:     He will be given a trial of a combination of GLP-1 drugs, metformin and Invokana Discussed with the patient the nature of GLP-1 drugs, the action on various organ systems, how they benefit blood glucose control, as well as the benefit of weight loss and  increase satiety . Explained possible side effects especially nausea and vomiting; discussed safety information in package insert. Demonstrated the medication injection device  and injection technique to the patient. Discussed injection sites and titration of Trulicity starting with 0.75 mg once a week for 4 weeks. Patient brochure on Trulicity and co-pay card given Discussed action of SGLT 2 drugs on lowering glucose by decreasing kidney absorption of glucose, benefits of weight loss and lower blood pressure, possible side effects including candidiasis and dosage regimen  He will start with 100 mg Invokana, in combination with metformin, for simplicity will have him use Invokamet and plan on using 2000 mg of metformin ER He may continue glimepiride for now but use only 4 mg Encouraged him to start walking for exercise Consultation with dietitian Start using Contour meter and also alternate fasting with two-hour postprandial blood sugars Follow-up in 3 weeks  Patient Instructions  Check blood sugars on waking up 3-4   times a week  Also check blood sugars about 2 hours after a meal  once a day and do this after different meals by rotation  Recommended blood sugar levels on waking up is 90-130 and about 2 hours after meal is 130-160  Please bring your blood sugar monitor to each visit, thank you   Glimepiride: Take only 1 tablet daily in the morning  Start TRULICITY with the pen as shown once weekly on the same day of the week.  You may inject in the sides of the stomach, thigh or arm as indicated in the brochure given.  You will feel fullness of the stomach with starting the medication and should try to keep the portions at meals small.  You may experience nausea in the first few days which usually gets better over time    If any questions or concerns are present call the office or the  [facility] at [phone]. Also visit [email or web address] website for more useful information  INVOKAMET: Start with  tablet at breakfast daily and after 4-5 days start taking 2 tablets daily       Counseling time on subjects discussed above is over 50% of today's 60  minute visit  Brando Taves 10/23/2015, 4:59 PM   Note: This office note was prepared with Estate agent. Any transcriptional errors that result  from this process are unintentional.

## 2015-10-23 NOTE — Patient Instructions (Signed)
Check blood sugars on waking up 3-4   times a week  Also check blood sugars about 2 hours after a meal  once a day and do this after different meals by rotation  Recommended blood sugar levels on waking up is 90-130 and about 2 hours after meal is 130-160  Please bring your blood sugar monitor to each visit, thank you   Glimepiride: Take only 1 tablet daily in the morning  Start TRULICITY with the pen as shown once weekly on the same day of the week.  You may inject in the sides of the stomach, thigh or arm as indicated in the brochure given.  You will feel fullness of the stomach with starting the medication and should try to keep the portions at meals small.  You may experience nausea in the first few days which usually gets better over time    If any questions or concerns are present call the office or the  [facility] at [phone]. Also visit [email or web address] website for more useful information  INVOKAMET: Start with  tablet at breakfast daily and after 4-5 days start taking 2 tablets daily

## 2015-10-27 ENCOUNTER — Other Ambulatory Visit (HOSPITAL_COMMUNITY): Payer: Self-pay | Admitting: Gastroenterology

## 2015-10-27 DIAGNOSIS — R748 Abnormal levels of other serum enzymes: Secondary | ICD-10-CM

## 2015-11-14 ENCOUNTER — Other Ambulatory Visit: Payer: Self-pay | Admitting: General Surgery

## 2015-11-17 ENCOUNTER — Ambulatory Visit (HOSPITAL_COMMUNITY)
Admission: RE | Admit: 2015-11-17 | Discharge: 2015-11-17 | Disposition: A | Discharge status: Home / Self Care | Payer: 59 | Source: Ambulatory Visit | Attending: Gastroenterology | Admitting: Gastroenterology

## 2015-11-17 ENCOUNTER — Encounter (HOSPITAL_COMMUNITY): Payer: Self-pay

## 2015-11-17 DIAGNOSIS — K7581 Nonalcoholic steatohepatitis (NASH): Secondary | ICD-10-CM | POA: Diagnosis not present

## 2015-11-17 DIAGNOSIS — R748 Abnormal levels of other serum enzymes: Secondary | ICD-10-CM

## 2015-11-17 DIAGNOSIS — E119 Type 2 diabetes mellitus without complications: Secondary | ICD-10-CM | POA: Diagnosis not present

## 2015-11-17 DIAGNOSIS — Z79899 Other long term (current) drug therapy: Secondary | ICD-10-CM | POA: Diagnosis not present

## 2015-11-17 DIAGNOSIS — E785 Hyperlipidemia, unspecified: Secondary | ICD-10-CM | POA: Diagnosis not present

## 2015-11-17 DIAGNOSIS — Z6841 Body Mass Index (BMI) 40.0 and over, adult: Secondary | ICD-10-CM | POA: Insufficient documentation

## 2015-11-17 DIAGNOSIS — Z8546 Personal history of malignant neoplasm of prostate: Secondary | ICD-10-CM | POA: Insufficient documentation

## 2015-11-17 DIAGNOSIS — I10 Essential (primary) hypertension: Secondary | ICD-10-CM | POA: Insufficient documentation

## 2015-11-17 DIAGNOSIS — F419 Anxiety disorder, unspecified: Secondary | ICD-10-CM | POA: Diagnosis not present

## 2015-11-17 DIAGNOSIS — Z923 Personal history of irradiation: Secondary | ICD-10-CM | POA: Insufficient documentation

## 2015-11-17 DIAGNOSIS — H9190 Unspecified hearing loss, unspecified ear: Secondary | ICD-10-CM | POA: Diagnosis not present

## 2015-11-17 DIAGNOSIS — Z7984 Long term (current) use of oral hypoglycemic drugs: Secondary | ICD-10-CM | POA: Diagnosis not present

## 2015-11-17 DIAGNOSIS — E669 Obesity, unspecified: Secondary | ICD-10-CM | POA: Diagnosis not present

## 2015-11-17 DIAGNOSIS — F319 Bipolar disorder, unspecified: Secondary | ICD-10-CM | POA: Insufficient documentation

## 2015-11-17 DIAGNOSIS — F329 Major depressive disorder, single episode, unspecified: Secondary | ICD-10-CM | POA: Diagnosis not present

## 2015-11-17 DIAGNOSIS — Z9884 Bariatric surgery status: Secondary | ICD-10-CM | POA: Diagnosis not present

## 2015-11-17 DIAGNOSIS — R740 Nonspecific elevation of levels of transaminase and lactic acid dehydrogenase [LDH]: Secondary | ICD-10-CM | POA: Diagnosis present

## 2015-11-17 LAB — CBC
HEMATOCRIT: 42.4 % (ref 39.0–52.0)
Hemoglobin: 13.3 g/dL (ref 13.0–17.0)
MCH: 29.8 pg (ref 26.0–34.0)
MCHC: 31.4 g/dL (ref 30.0–36.0)
MCV: 95.1 fL (ref 78.0–100.0)
Platelets: 261 10*3/uL (ref 150–400)
RBC: 4.46 MIL/uL (ref 4.22–5.81)
RDW: 13.8 % (ref 11.5–15.5)
WBC: 7.7 10*3/uL (ref 4.0–10.5)

## 2015-11-17 LAB — APTT: aPTT: 29 seconds (ref 24–37)

## 2015-11-17 LAB — PROTIME-INR
INR: 1.1 (ref 0.00–1.49)
Prothrombin Time: 14.4 seconds (ref 11.6–15.2)

## 2015-11-17 MED ORDER — FENTANYL CITRATE (PF) 100 MCG/2ML IJ SOLN
INTRAMUSCULAR | Status: AC
  Filled 2015-11-17: qty 2

## 2015-11-17 MED ORDER — MIDAZOLAM HCL 2 MG/2ML IJ SOLN
INTRAMUSCULAR | Status: AC | PRN
  Administered 2015-11-17: 0.5 mg via INTRAVENOUS
  Administered 2015-11-17: 1 mg via INTRAVENOUS

## 2015-11-17 MED ORDER — MIDAZOLAM HCL 2 MG/2ML IJ SOLN
INTRAMUSCULAR | Status: AC
  Filled 2015-11-17: qty 4

## 2015-11-17 MED ORDER — SODIUM CHLORIDE 0.9 % IV SOLN: INTRAVENOUS | Status: DC

## 2015-11-17 MED ORDER — GELATIN ABSORBABLE 12-7 MM EX MISC
CUTANEOUS | Status: AC
  Filled 2015-11-17: qty 1

## 2015-11-17 MED ORDER — FENTANYL CITRATE (PF) 100 MCG/2ML IJ SOLN
INTRAMUSCULAR | Status: AC | PRN
  Administered 2015-11-17: 25 ug via INTRAVENOUS
  Administered 2015-11-17: 50 ug via INTRAVENOUS

## 2015-11-17 MED ORDER — LIDOCAINE HCL 1 % IJ SOLN
INTRAMUSCULAR | Status: AC
  Filled 2015-11-17: qty 20

## 2015-11-17 NOTE — Procedures (Signed)
Interventional Radiology Procedure Note  Procedure: US guided random liver biopsy.  3 x 18G core.   Complications: None Recommendations:  - 1 hour observation - follow up pathology - Routine wound care   Signed,  Dulcy Fanny. Earleen Newport, DO

## 2015-11-17 NOTE — H&P (Signed)
Chief Complaint: fatty liver disease  Referring Physician:Dr. Anson Fret  Supervising Physician: Corrie Mckusick  HPI: Frank Cox is an 55 y.o. male who has recently been diagnosed with fatty liver disease after an [place] was done due to elevated LFTs.  He presents today for a random liver biopsy.  The patient has otherwise been feeling well with no new complaints.  Past Medical History:  Past Medical History  Diagnosis Date  . Depression   . Anxiety   . CPAP (continuous positive airway pressure) dependence   . Poor circulation   . Hypertension     boarderline - elevated  . Fatigue   . Diabetes mellitus   . Kidney stone   . Generalized headaches   . Hearing loss   . Contact lens/glasses fitting   . Bruises easily   . Obesity   . Prostate cancer ([place]) 02/14/14    Gleason 4+3=7, volume 56 mL  . Arthritis   . Hyperlipidemia   . Bipolar 1 disorder ([place])     "well controlled on Lithium"  . S/P radiation therapy 07/03/2014 through 08/28/2014                                                        Prostate 7800 cGy in 40 sessions, seminal vesicles 5600 cGy in 40 sessions                          Past Surgical History:  Past Surgical History  Procedure Laterality Date  . Laparoscopic gastric banding  09/09/09  . Prostate biopsy  02/14/14    Gleason 7, volume 56 mL    Family History:  Family History  Problem Relation Age of Onset  . Arthritis Mother   . Alcohol abuse Mother   . Arthritis Father     Social History:  reports that he has never smoked. He has never used smokeless tobacco. He reports that he does not drink alcohol or use illicit drugs.  Allergies: No Known Allergies  Medications:   Medication List    ASK your doctor about these medications        acetaminophen 500 MG tablet  Commonly known as:  TYLENOL  Take 1,000 mg by mouth every 6 (six) hours as needed for mild pain.     BAYER MICROLET LANCETS lancets  Use as instructed to check blood sugar 2  times per day dx code E11.9     Canagliflozin-Metformin HCl ER 50-1000 MG Tb24  Commonly known as:  INVOKAMET XR  Take 50-1,000 mg by mouth 2 (two) times daily.     citalopram 40 MG tablet  Commonly known as:  CELEXA  Take 40 mg by mouth daily.     clonazePAM 1 MG tablet  Commonly known as:  KLONOPIN  Take 3 mg by mouth at bedtime.     diphenhydrAMINE 25 MG tablet  Commonly known as:  BENADRYL  Take 50 mg by mouth every 6 (six) hours as needed for allergies.     Dulaglutide 0.75 MG/0.5ML Sopn  Commonly known as:  TRULICITY  Inject the contents of one pen once per week     glimepiride 4 MG tablet  Commonly known as:  AMARYL  Take 2 tablets (8 mg total) by mouth daily with  breakfast.     glucose blood test strip  Commonly known as:  BAYER CONTOUR NEXT TEST  Use as instructed to check blood sugar 2 times daily dx code E11.9     lamoTRIgine 200 MG tablet  Commonly known as:  LAMICTAL  Take 200 mg by mouth at bedtime.     LATUDA 40 MG Tabs tablet  Generic drug:  lurasidone  Take 40 mg by mouth at bedtime.     lithium carbonate 450 MG CR tablet  Commonly known as:  ESKALITH  Take 1,800 mg by mouth at bedtime.     metoprolol succinate 25 MG 24 hr tablet  Commonly known as:  TOPROL-XL  Take 25 mg by mouth every morning.     MULTIVITAMIN PO  Take 1 tablet by mouth every morning. [employer] brand Solotron Chewable.     QUEtiapine 100 MG tablet  Commonly known as:  SEROQUEL  Take 200 mg by mouth at bedtime.     tamsulosin 0.4 MG Caps capsule  Commonly known as:  FLOMAX  Take 0.4 mg by mouth daily after breakfast.        Please HPI for pertinent positives, otherwise complete 10 system ROS negative.  Mallampati Score: MD Evaluation Airway: WNL Heart: WNL Abdomen: WNL Chest/ Lungs: WNL ASA  Classification: 3 Mallampati/Airway Score: Two  Physical Exam: BP 167/84 mmHg  Pulse 73  Temp(Src) 98.3 F (36.8 C)  Resp 18  Ht 5\' 10"  (1.778 m)  Wt 347 lb (157.398 kg)   BMI 49.79 kg/m2  SpO2 100% Body mass index is 49.79 kg/(m^2). General: pleasant, morbidly obese white male who is laying in bed in NAD HEENT: head is normocephalic, atraumatic.  Sclera are noninjected.  PERRL.  Ears and nose without any masses or lesions.  Mouth is pink and moist Heart: regular, rate, and rhythm.  Normal s1,s2. No obvious murmurs, gallops, or rubs noted.  Palpable radial and pedal pulses bilaterally Lungs: CTAB, no wheezes, rhonchi, or rales noted.  Respiratory effort nonlabored Abd: soft, NT, morbidly obese, +BS, no masses, hernias MS: all 4 extremities are symmetrical with no cyanosis, clubbing.  Trace LE edema.  Chronic venous stasis changes of his bilateral lower extremities Skin: warm and dry with no masses, lesions, or rashes Psych: A&Ox3 with an appropriate affect.   Labs: No results found for this or any previous visit (from the past 48 hour(s)).  Imaging: No results found.  Assessment/Plan 1. Elevated LFTs with fatty liver disease -we will plan on a random liver biopsy today. -labs are pending -NPO since last night -Risks and Benefits discussed with the patient including, but not limited to bleeding, infection, damage to adjacent structures or low yield requiring additional tests. All of the patient's questions were answered, patient is agreeable to proceed. Consent signed and in chart.    Thank you for this interesting consult.  I greatly enjoyed meeting Frank Cox and look forward to participating in their care.  A copy of this report was sent to the requesting provider on this date.  Electronically Signed: Henreitta Cea 11/17/2015, 1:30 PM   I spent a total of  30 Minutes  in face to face in clinical consultation, greater than 50% of which was counseling/coordinating care for elevated LFTs, fatty liver disease, random liver biopsy

## 2015-11-17 NOTE — Discharge Instructions (Signed)

## 2015-11-17 NOTE — Sedation Documentation (Signed)
Patient is resting comfortably. 

## 2015-11-20 ENCOUNTER — Encounter: Payer: Self-pay | Admitting: Dietician

## 2015-11-20 ENCOUNTER — Encounter: Payer: 59 | Attending: Endocrinology | Admitting: Dietician

## 2015-11-20 VITALS — Ht 70.0 in | Wt 356.0 lb

## 2015-11-20 DIAGNOSIS — E1165 Type 2 diabetes mellitus with hyperglycemia: Secondary | ICD-10-CM | POA: Insufficient documentation

## 2015-11-20 DIAGNOSIS — IMO0002 Reserved for concepts with insufficient information to code with codable children: Secondary | ICD-10-CM

## 2015-11-20 DIAGNOSIS — E118 Type 2 diabetes mellitus with unspecified complications: Secondary | ICD-10-CM

## 2015-11-20 DIAGNOSIS — K760 Fatty (change of) liver, not elsewhere classified: Secondary | ICD-10-CM

## 2015-11-20 NOTE — Patient Instructions (Signed)
Start an exercise routine, walk in the mall, find other things you enjoy.  Start with 10 minutes several times per day. Watch portion size. Aim for 3 Carb Choices per meal (45 grams) +/- 1 either way  Aim for 0-1 Carbs per snack if hungry  Include protein in moderation with your meals and snacks

## 2015-11-20 NOTE — Progress Notes (Signed)
  Medical Nutrition Therapy:  Appt start time: 0800 end time:  0900.   Assessment:  Primary concerns today: Patient is here alone.  He is here due to type 2 diabetes and fatty liver as well as weight.  He uses a c.pap each night.  Hx also includes a history of prostate cancer and he will go for six month follow up soon as well as bipolar. He states that the bipolar is now controlled yet does not have motivation for exercise and the meds cause weight issues as well.  HgbA1C:  6.0% 2011 increased to 7.2% 09/07/14 and 9.9% 08/25/15. He reports taking his medication appropriately but finds the Invokamet hard to swallow.  Weight hx: Highest adult weight 400 lbs in 2011 prior to lap band surgery.  He then lost to 320 lbs.  Had a gastric sleeve and is now 356 lbs today. Lowest adult weight 210 lbs in highschool.  Patient's parents live with him.  Mother does the cooking.  He works for the [employer].  This is a position of rotating shifts but he is currently working days- 12 hours 3-4 times per week.     Preferred Learning Style:   No preference indicated   Learning Readiness:   Ready  MEDICATIONS: see list to include Invokamet XR, Trulicity, Amaryl   DIETARY INTAKE:  24-hr recall:  B ( AM): 1/2 peanut butter sandwich  Snk ( AM): 1/2 peanut butter sandwich, clementine or grapes L ( PM): 1/2 peanut butter sandwich OR chili and bun less burger from [employer] Snk ( PM): 1/2 peanut butter sandwich or occasional NABS D ( PM): steak, broccoli, ice cream (his birthday) OR chicken or pork, rice, salad, slaw, beans, vegetables  OFTEN skips on days that he works. Snk ( PM): fruit (fresh or canned) even in the middle of the night at times Beverages: diet Dr. Malachi Bonds (2L), water (2L per day), sugar free coolade  Usual physical activity: ADL's  Estimated energy needs: 1600 calories 180 g carbohydrates 100 g protein 53 g fat  Progress Towards Goal(s):  In  progress.   Nutritional Diagnosis:  NB-1.1 Food and nutrition-related knowledge deficit As related to balance of carbohydrates, protein, and fat.  As evidenced by diet hx.    Intervention:  Nutrition counseling and diabetes education initiated. Discussed Carb Counting by food group as method of portion control, reading food labels, and benefits of increased activity. Also discussed basic physiology of Diabetes, target BG ranges pre and post meals, and A1c.    Start an exercise routine, walk in the mall, find other things you enjoy.  Start with 10 minutes several times per day. Watch portion size. Aim for 3 Carb Choices per meal (45 grams) +/- 1 either way  Aim for 0-1 Carbs per snack if hungry  Include protein in moderation with your meals and snacks  Teaching Method Utilized:  Visual Auditory Hands on  Handouts given during visit include:  My plate  Meal plan card  Snack list  Barriers to learning/adherence to lifestyle change: bipolar, shift work  Demonstrated degree of understanding via:  Teach Back   Monitoring/Evaluation:  Dietary intake, exercise, and body weight prn.

## 2015-11-21 ENCOUNTER — Ambulatory Visit (INDEPENDENT_AMBULATORY_CARE_PROVIDER_SITE_OTHER): Payer: 59 | Admitting: Endocrinology

## 2015-11-21 ENCOUNTER — Encounter: Payer: Self-pay | Admitting: Endocrinology

## 2015-11-21 VITALS — BP 118/64 | HR 56 | Temp 98.8°F | Resp 16 | Ht 70.0 in | Wt 349.0 lb

## 2015-11-21 DIAGNOSIS — E1165 Type 2 diabetes mellitus with hyperglycemia: Secondary | ICD-10-CM | POA: Diagnosis not present

## 2015-11-21 MED ORDER — DULAGLUTIDE 1.5 MG/0.5ML ~~LOC~~ SOAJ: SUBCUTANEOUS | Status: DC

## 2015-11-21 NOTE — Patient Instructions (Signed)
Check blood sugars on waking up 2  times a week Also check blood sugars about 2 hours after a meal and do this after different meals by rotation  Recommended blood sugar levels on waking up is 90-130 and about 2 hours after meal is 130-160  Please bring your blood sugar monitor to each visit, thank you  Glimperide 1/2 am and at dinner

## 2015-11-21 NOTE — Progress Notes (Signed)
Patient ID: Frank Cox, male   DOB: 04-Jun-1961, 55 y.o.   MRN: [id number]           Reason for Appointment: for Type 2 Diabetes  Referring physician: Moreen Fowler  History of Present Illness:          Date of diagnosis of type 2 diabetes mellitus:   2010       Background history:   At the time of diagnosis he was treated with metformin which she thinks she took for about 3 years This was stopped when he had his gastric sleeve procedure in 2013; however he lost only about 40-50 pounds with this; his blood sugars did come back to normal After a few years his blood sugars started increasing and he was started on Amaryl His A1c was ranging from 7-7.4 in 2015 and 2016 although from outside lab was 6.5 in 11/15  Recent history:      His blood sugars had been progressively higher with A1c 9.9% on his first consultation In addition to Amaryl he was started on Trulicity and Invokamet  Current management, blood sugar patterns and problems identified:    His blood sugars are gradually improving especially in the last week  Has only initial side effects of mild diarrhea with Invokamet   His highest blood sugars are after lunch and supper  Fasting blood sugars are relatively better but still not optimal   He has just seen the dietitian for the first time this month and is going to make some changes in his meal plans  He is still not doing much exercise despite reminders, previously complaining of fatigue and symptoms of hyperglycemia  His weight is up 2 pounds but his blood sugars are overall significantly better, previously averaging at least 250 in the morning  Non-insulin hypoglycemic drugs are:     Amaryl 4 mg daily, Trulicity [id number] mg daily, Invokamet 50/1000, 2 tablets daily  Side effects from medications have been: None  Compliance with the medical regimen: Fair  Glucose monitoring:  done 1  times a day         Glucometer: Contour Blood Glucose readings  Mean values apply  above for all meters except median for One Touch  PRE-MEAL Fasting Lunch Afternoon  6 PM-10PM  Overall  Glucose range: 131-274   86-231  140-303    Mean/median: 170   141  209  178+/-50     Self-care: The diet that the patient has been following is: none He tries to avoid drinks which sugar.  Usually eating at home in the evenings  Supper 6  pm  Dietician visit, most recent: never               Exercise:  none  Weight history:   Wt Readings from Last 3 Encounters:  11/21/15 349 lb (158.305 kg)  11/20/15 356 lb (161.481 kg)  11/17/15 347 lb (157.398 kg)    Glycemic control:   Lab Results  Component Value Date   HGBA1C 9.9 08/25/2015   HGBA1C 7.2* 09/26/2014   HGBA1C 7.4* 11/17/2013   Lab Results  Component Value Date   CREATININE 1.32 09/26/2014         Medication List       This list is accurate as of: 11/21/15 11:59 PM.  Always use your most recent med list.               acetaminophen 500 MG tablet  Commonly known as:  TYLENOL  Take  1,000 mg by mouth every 6 (six) hours as needed for mild pain.     b complex vitamins tablet  Take 1 tablet by mouth daily.     BAYER MICROLET LANCETS lancets  Use as instructed to check blood sugar 2 times per day dx code E11.9     Canagliflozin-Metformin HCl ER 50-1000 MG Tb24  Commonly known as:  INVOKAMET XR  Take 50-1,000 mg by mouth 2 (two) times daily.     cholecalciferol 1000 units tablet  Commonly known as:  VITAMIN D  Take 1,000 Units by mouth daily.     citalopram 40 MG tablet  Commonly known as:  CELEXA  Take 40 mg by mouth daily.     clonazePAM 1 MG tablet  Commonly known as:  KLONOPIN  Take 3 mg by mouth at bedtime.     diphenhydrAMINE 25 MG tablet  Commonly known as:  BENADRYL  Take 50 mg by mouth every 6 (six) hours as needed for allergies.     Dulaglutide 1.5 MG/0.5ML Sopn  Commonly known as:  TRULICITY  Inject weekly     glimepiride 4 MG tablet  Commonly known as:  AMARYL  Take 2  tablets (8 mg total) by mouth daily with breakfast.     glucose blood test strip  Commonly known as:  BAYER CONTOUR NEXT TEST  Use as instructed to check blood sugar 2 times daily dx code E11.9     lamoTRIgine 200 MG tablet  Commonly known as:  LAMICTAL  Take 200 mg by mouth at bedtime.     LATUDA 40 MG Tabs tablet  Generic drug:  lurasidone  Take 40 mg by mouth at bedtime.     lithium carbonate 450 MG CR tablet  Commonly known as:  ESKALITH  Take 1,800 mg by mouth at bedtime.     metoprolol succinate 25 MG 24 hr tablet  Commonly known as:  TOPROL-XL  Take 25 mg by mouth every morning.     MULTIVITAMIN PO  Take 1 tablet by mouth every morning. [employer] brand Solotron Chewable.     QUEtiapine 100 MG tablet  Commonly known as:  SEROQUEL  Take 200 mg by mouth at bedtime.     tamsulosin 0.4 MG Caps capsule  Commonly known as:  FLOMAX  Take 0.4 mg by mouth daily after breakfast.        Allergies: No Known Allergies  Past Medical History  Diagnosis Date  . Depression   . Anxiety   . CPAP (continuous positive airway pressure) dependence   . Poor circulation   . Hypertension     boarderline - elevated  . Fatigue   . Diabetes mellitus   . Kidney stone   . Generalized headaches   . Hearing loss   . Contact lens/glasses fitting   . Bruises easily   . Obesity   . Prostate cancer ([place]) 02/14/14    Gleason 4+3=7, volume 56 mL  . Arthritis   . Hyperlipidemia   . Bipolar 1 disorder ([place])     "well controlled on Lithium"  . S/P radiation therapy 07/03/2014 through 08/28/2014                                                        Prostate 7800 cGy in 40 sessions, seminal vesicles 5600  cGy in 40 sessions                          Past Surgical History  Procedure Laterality Date  . Laparoscopic gastric banding  09/09/09  . Prostate biopsy  02/14/14    Gleason 7, volume 56 mL    Family History  Problem Relation Age of Onset  . Arthritis Mother   . Alcohol abuse Mother   .  Arthritis Father     Social History:  reports that he has never smoked. He has never used smokeless tobacco. He reports that he does not drink alcohol or use illicit drugs.    Review of Systems     Lipid history: LDL was 55 in 12/16, was on Lipitor then, this was stopped because of abnormal liver functions   No results found for: CHOL, HDL, LDLCALC, LDLDIRECT, TRIG, CHOLHDL          Most recent eye exam was 2016, reportedly normal  Most recent foot exam: 99991111  Diabetic complications: Appears to have peripheral neuropathy.  Has had increased urine microalbumin.  No reported retinopathy  He is not on any antihypertensives   Review of Systems    Physical Examination:  BP 118/64 mmHg  Pulse 56  Temp(Src) 98.8 F (37.1 C)  Resp 16  Ht 5\' 10"  (1.778 m)  Wt 349 lb (158.305 kg)  BMI 50.08 kg/m2  SpO2 97%  ASSESSMENT:  Diabetes type 2, uncontrolled with BMI  50 See history of present illness for detailed discussion of current diabetes management, blood sugar patterns and problems identified  He has been tolerating his new regimen of Trulicity A999333 mg weekly and also Invokamet He does need some further improvement in his blood sugars although in the last week they are getting close to target Amaryl was reduced on the last visit Currently lowest blood sugars are in the afternoons  OBESITY: Has not lost any weight, previously had failed LAP-BAND and gastric sleeve procedure  PLAN:     He will increase Trulicity to 1.5 mg for better blood sugar control as well as promoting weight loss which he has not been able to achieve, discussed possible side effects of nausea and he will let us know if he does not tolerate this  May continue to take Invokamet 2 tablets in the morning after food which he is doing now  Follow-up instructions given by dietitian for meal planning  Continue other medications unchanged except split Amaryl to half tablet twice a day  Discussed timing  and targets of blood sugar monitoring  Need to start regular exercise with walking Follow-up in 6 weeks and need follow-up A1c  Continue to follow renal function with using Invokamet in combination with lithium Will also need follow-up of urine microalbumin once glucose is better controlled  Patient Instructions  Check blood sugars on waking up 2  times a week Also check blood sugars about 2 hours after a meal and do this after different meals by rotation  Recommended blood sugar levels on waking up is 90-130 and about 2 hours after meal is 130-160  Please bring your blood sugar monitor to each visit, thank you  Glimperide 1/2 am and at dinner      Counseling time on subjects discussed above is over 50% of today's 25 minute visit  Jamani Bearce 11/22/2015, 6:16 PM   Note: This office note was prepared with Dragon voice recognition system technology. Any transcriptional errors that result  from this process are unintentional.

## 2015-12-29 ENCOUNTER — Other Ambulatory Visit (INDEPENDENT_AMBULATORY_CARE_PROVIDER_SITE_OTHER): Payer: 59

## 2015-12-29 DIAGNOSIS — E1165 Type 2 diabetes mellitus with hyperglycemia: Secondary | ICD-10-CM

## 2015-12-29 LAB — COMPREHENSIVE METABOLIC PANEL
ALT: 58 U/L — AB (ref 0–53)
AST: 48 U/L — AB (ref 0–37)
Albumin: 4.3 g/dL (ref 3.5–5.2)
Alkaline Phosphatase: 56 U/L (ref 39–117)
BILIRUBIN TOTAL: 0.6 mg/dL (ref 0.2–1.2)
BUN: 12 mg/dL (ref 6–23)
CO2: 25 meq/L (ref 19–32)
CREATININE: 1.27 mg/dL (ref 0.40–1.50)
Calcium: 12.1 mg/dL — ABNORMAL HIGH (ref 8.4–10.5)
Chloride: 111 mEq/L (ref 96–112)
GFR: 62.33 mL/min (ref >60.00)
GLUCOSE: 101 mg/dL — AB (ref 70–99)
Potassium: 4.1 mEq/L (ref 3.5–5.1)
Sodium: 144 mEq/L (ref 135–145)
Total Protein: 7.4 g/dL (ref 6.0–8.3)

## 2015-12-29 LAB — MICROALBUMIN / CREATININE URINE RATIO
CREATININE, U: 15 mg/dL
MICROALB/CREAT RATIO: 126.3 mg/g — AB (ref 0.0–30.0)
Microalb, Ur: 18.9 mg/dL — ABNORMAL HIGH (ref 0.0–1.9)

## 2015-12-29 LAB — HEMOGLOBIN A1C: HEMOGLOBIN A1C: 7.8 % — AB (ref 4.6–6.5)

## 2016-01-05 ENCOUNTER — Other Ambulatory Visit: Payer: Self-pay | Admitting: Endocrinology

## 2016-01-05 ENCOUNTER — Ambulatory Visit (INDEPENDENT_AMBULATORY_CARE_PROVIDER_SITE_OTHER): Payer: 59 | Admitting: Endocrinology

## 2016-01-05 ENCOUNTER — Encounter: Payer: Self-pay | Admitting: Endocrinology

## 2016-01-05 VITALS — BP 152/52 | HR 98 | Temp 98.6°F | Resp 16 | Ht 70.0 in | Wt 353.8 lb

## 2016-01-05 DIAGNOSIS — R5383 Other fatigue: Secondary | ICD-10-CM

## 2016-01-05 DIAGNOSIS — E1165 Type 2 diabetes mellitus with hyperglycemia: Secondary | ICD-10-CM | POA: Diagnosis not present

## 2016-01-05 DIAGNOSIS — Z794 Long term (current) use of insulin: Secondary | ICD-10-CM

## 2016-01-05 NOTE — Progress Notes (Signed)
Patient ID: Frank Cox, male   DOB: May 20, 1961, 55 y.o.   MRN: [id number]           Reason for Appointment: Follow-up for Type 2 Diabetes  Referring physician: Moreen Fowler  History of Present Illness:          Date of diagnosis of type 2 diabetes mellitus:   2010       Background history:   At the time of diagnosis he was treated with metformin which she thinks she took for about 3 years This was stopped when he had his gastric sleeve procedure in 2013; however he lost only about 40-50 pounds with this; his blood sugars did come back to normal After a few years his blood sugars started increasing and he was started on Amaryl His A1c was ranging from 7-7.4 in 2015 and 2016 although from outside lab was 6.5 in 11/15  Recent history:      His blood sugars had been progressively higher with A1c 9.9% on his first consultation In addition to Amaryl he was started on Trulicity and Invokamet, Trulicity was increased in 3/17 However his A1c is still relatively higher at 7.8  Current management, blood sugar patterns and problems identified:    His blood sugars are relatively better in the morning and higher after evening meal  This is despite his saying that he is eating a smaller meal in the evening; not checking very often in the evening  He now says that he is getting loose stools and some abdominal discomfort with taking metformin in the Invokamet; however is taking this at breakfast and supper when he is frequently not eating much.  His larger meal is at lunch  Even though he has been seen by the dietitian he has not lost any weight, he thinks he is eating small portions usually  He is trying to walk when he can when he is not working, tends to get.of breath on walking  Lowest blood sugars are in the late afternoon    Non-insulin hypoglycemic drugs are:     Amaryl 4 mg daily, Trulicity A999333 mg daily, Invokamet 50/1000, 2 tablets daily  Side effects from medications have been:  None  Compliance with the medical regimen: Fair  Glucose monitoring:  done 1  times a day         Glucometer: Contour Blood Glucose readings  Mean values apply above for all meters except median for One Touch  PRE-MEAL Fasting Lunch Dinner Bedtime Overall  Glucose range: 101-196  127, 174  81, 97  97-328    Mean/median: 140     150     Self-care: The diet that the patient has been following is: Smaller portions He tries to avoid drinks with sugar.  Usually eating at home in the evenings  Supper usually at 6  pm, may skip  Dietician visit, most recent: never               Exercise: walking 30 min bid, 2-3/7  Weight history:   Wt Readings from Last 3 Encounters:  01/05/16 353 lb 12.8 oz (160.483 kg)  11/21/15 349 lb (158.305 kg)  11/20/15 356 lb (161.481 kg)    Glycemic control:   Lab Results  Component Value Date   HGBA1C 7.8* 12/29/2015   HGBA1C 9.9 08/25/2015   HGBA1C 7.2* 09/26/2014   Lab Results  Component Value Date   MICROALBUR 18.9* 12/29/2015   CREATININE 1.27 12/29/2015     OTHER active problems:  See review of systems      Medication List       This list is accurate as of: 01/05/16  9:03 AM.  Always use your most recent med list.               acetaminophen 500 MG tablet  Commonly known as:  TYLENOL  Take 1,000 mg by mouth every 6 (six) hours as needed for mild pain.     b complex vitamins tablet  Take 1 tablet by mouth daily.     BAYER MICROLET LANCETS lancets  Use as instructed to check blood sugar 2 times per day dx code E11.9     Canagliflozin-Metformin HCl ER 50-1000 MG Tb24  Commonly known as:  INVOKAMET XR  Take 50-1,000 mg by mouth 2 (two) times daily.     cholecalciferol 1000 units tablet  Commonly known as:  VITAMIN D  Take 1,000 Units by mouth daily.     citalopram 40 MG tablet  Commonly known as:  CELEXA  Take 40 mg by mouth daily.     clonazePAM 1 MG tablet  Commonly known as:  KLONOPIN  Take 3 mg by mouth at  bedtime.     diphenhydrAMINE 25 MG tablet  Commonly known as:  BENADRYL  Take 50 mg by mouth every 6 (six) hours as needed for allergies.     Dulaglutide 1.5 MG/0.5ML Sopn  Commonly known as:  TRULICITY  Inject weekly     glimepiride 4 MG tablet  Commonly known as:  AMARYL  Take 2 tablets (8 mg total) by mouth daily with breakfast.     glucose blood test strip  Commonly known as:  BAYER CONTOUR NEXT TEST  Use as instructed to check blood sugar 2 times daily dx code E11.9     lamoTRIgine 200 MG tablet  Commonly known as:  LAMICTAL  Take 200 mg by mouth at bedtime.     LATUDA 40 MG Tabs tablet  Generic drug:  lurasidone  Take 40 mg by mouth at bedtime.     lithium carbonate 450 MG CR tablet  Commonly known as:  ESKALITH  Take 1,800 mg by mouth at bedtime.     metoprolol succinate 25 MG 24 hr tablet  Commonly known as:  TOPROL-XL  Take 25 mg by mouth every morning.     MULTIVITAMIN PO  Take 1 tablet by mouth every morning. [employer] brand Solotron Chewable.     QUEtiapine 100 MG tablet  Commonly known as:  SEROQUEL  Take 200 mg by mouth at bedtime.     tamsulosin 0.4 MG Caps capsule  Commonly known as:  FLOMAX  Take 0.4 mg by mouth daily after breakfast.        Allergies: No Known Allergies  Past Medical History  Diagnosis Date  . Depression   . Anxiety   . CPAP (continuous positive airway pressure) dependence   . Poor circulation   . Hypertension     boarderline - elevated  . Fatigue   . Diabetes mellitus   . Kidney stone   . Generalized headaches   . Hearing loss   . Contact lens/glasses fitting   . Bruises easily   . Obesity   . Prostate cancer ([place]) 02/14/14    Gleason 4+3=7, volume 56 mL  . Arthritis   . Hyperlipidemia   . Bipolar 1 disorder ([place])     "well controlled on Lithium"  . S/P radiation therapy 07/03/2014 through 08/28/2014  Prostate 7800 cGy in 40 sessions, seminal vesicles 5600 cGy in  40 sessions                          Past Surgical History  Procedure Laterality Date  . Laparoscopic gastric banding  09/09/09  . Prostate biopsy  02/14/14    Gleason 7, volume 56 mL    Family History  Problem Relation Age of Onset  . Arthritis Mother   . Alcohol abuse Mother   . Arthritis Father     Social History:  reports that he has never smoked. He has never used smokeless tobacco. He reports that he does not drink alcohol or use illicit drugs.    Review of Systems    HYPERCALCEMIA: Apparently this has been present since 2014 when his PTH level was 37 His level has been variable and now up to 12.1. He is not having any history of kidney stones or known osteopenia   Lipid history: LDL was 55 in 12/16, was on Lipitor then, this was stopped because of abnormal liver functions   No results found for: CHOL, HDL, LDLCALC, LDLDIRECT, TRIG, CHOLHDL          Most recent eye exam was 2016, reportedly normal  Most recent foot exam: 99991111  Diabetic complications: Appears to have peripheral neuropathy.  Has had increased urine microalbumin.  No reported retinopathy  He is not on any antihypertensives but blood pressure is relatively high, reportedly [id number] systolic with PCP    Review of Systems    Physical Examination:  BP 152/52 mmHg  Pulse 98  Temp(Src) 98.6 F (37 C)  Resp 16  Ht 5\' 10"  (1.778 m)  Wt 353 lb 12.8 oz (160.483 kg)  BMI 50.77 kg/m2  SpO2 96%  ASSESSMENT:  Diabetes type 2, uncontrolled with BMI  50 See history of present illness for detailed discussion of current diabetes management, blood sugar patterns and problems identified  Although his blood sugars have overall improved his A1c is still 7.8 This may not be reflecting his most recent blood sugars which are averaging 150 at home He does have sporadic high readings after meals in the evening based on his diet Still has not lost any weight despite his trying to walk a little Has seen the  dietitian already He now says that he may not be taking his Invokamet consistently because of GI distress from metformin in the Invokamet Currently lowest blood sugars are in the afternoons  OBESITY: Has not lost any weight, previously had failed LAP-BAND and gastric sleeve procedure  HYPERCALCEMIA: Likely to be from hyperparathyroidism, needs further evaluation.  Currently asymptomatic but if calcium stays over 12 May consider surgery  PLAN:     He will try to take the Invokamet both tablets at lunch, if this is not tolerated will use 300 mg Invokana separately and metformin ER 1-1.5 g daily  More readings after supper  Reduce Amaryl to half tablet in the morning  Continue to improve diet  Follow-up in 6 weeks for reassessment  May consider weight loss medications also Will check PTH and 1, 25-hydroxy D on Friday   Will also need follow-up of urine microalbumin once glucose is better controlled  Patient Instructions  Take Invokamet both at lunch, call if not tolerated  Reduce Glimeperide to 1/2 in am  Check blood sugars on waking up 3  times a week Also check blood sugars about 2 hours after a meal and  do this after different meals by rotation  Recommended blood sugar levels on waking up is 90-130 and about 2 hours after meal is 130-160  Please bring your blood sugar monitor to each visit, thank you    Counseling time on subjects discussed above is over 50% of today's 25 minute visit  Hagen Tidd 01/05/2016, 9:03 AM   Note: This office note was prepared with Estate agent. Any transcriptional errors that result from this process are unintentional.

## 2016-01-05 NOTE — Patient Instructions (Addendum)
Take Invokamet both at lunch, call if not tolerated  Reduce Glimeperide to 1/2 in am  Check blood sugars on waking up 3  times a week Also check blood sugars about 2 hours after a meal and do this after different meals by rotation  Recommended blood sugar levels on waking up is 90-130 and about 2 hours after meal is 130-160  Please bring your blood sugar monitor to each visit, thank you

## 2016-01-09 ENCOUNTER — Other Ambulatory Visit: Payer: Self-pay | Admitting: Endocrinology

## 2016-01-09 ENCOUNTER — Other Ambulatory Visit (INDEPENDENT_AMBULATORY_CARE_PROVIDER_SITE_OTHER): Payer: 59

## 2016-01-09 DIAGNOSIS — R5383 Other fatigue: Secondary | ICD-10-CM

## 2016-01-09 LAB — RENAL FUNCTION PANEL
ALBUMIN: 4.2 g/dL (ref 3.5–5.2)
BUN: 11 mg/dL (ref 6–23)
CHLORIDE: 112 meq/L (ref 96–112)
CO2: 23 mEq/L (ref 19–32)
CREATININE: 1.31 mg/dL (ref 0.40–1.50)
Calcium: 11.4 mg/dL — ABNORMAL HIGH (ref 8.4–10.5)
GFR: 60.13 mL/min (ref >60.00)
Glucose, Bld: 141 mg/dL — ABNORMAL HIGH (ref 70–99)
PHOSPHORUS: 3.2 mg/dL (ref 2.3–4.6)
Potassium: 3.7 mEq/L (ref 3.5–5.1)
Sodium: 144 mEq/L (ref 135–145)

## 2016-01-09 LAB — TSH: TSH: 1.72 u[IU]/mL (ref 0.35–4.50)

## 2016-01-09 LAB — T4, FREE: Free T4: 0.73 ng/dL (ref 0.60–1.60)

## 2016-01-10 LAB — PARATHYROID HORMONE, INTACT (NO CA): PTH: 51 pg/mL (ref 15–65)

## 2016-01-13 LAB — VITAMIN D 1,25 DIHYDROXY
VITAMIN D3 1, 25 (OH): 30 pg/mL
Vitamin D 1, 25 (OH)2 Total: 30 pg/mL

## 2016-02-16 ENCOUNTER — Other Ambulatory Visit: Payer: 59

## 2016-02-16 ENCOUNTER — Other Ambulatory Visit (INDEPENDENT_AMBULATORY_CARE_PROVIDER_SITE_OTHER): Payer: 59

## 2016-02-16 ENCOUNTER — Other Ambulatory Visit: Payer: Self-pay | Admitting: Endocrinology

## 2016-02-16 DIAGNOSIS — E1165 Type 2 diabetes mellitus with hyperglycemia: Secondary | ICD-10-CM

## 2016-02-16 LAB — BASIC METABOLIC PANEL
BUN: 8 mg/dL (ref 6–23)
CALCIUM: 11.4 mg/dL — AB (ref 8.4–10.5)
CO2: 20 mEq/L (ref 19–32)
Chloride: 112 mEq/L (ref 96–112)
Creatinine, Ser: 1.34 mg/dL (ref 0.40–1.50)
GFR: 58.56 mL/min — AB (ref >60.00)
GLUCOSE: 254 mg/dL — AB (ref 70–99)
Potassium: 3.4 mEq/L — ABNORMAL LOW (ref 3.5–5.1)
SODIUM: 143 meq/L (ref 135–145)

## 2016-02-17 ENCOUNTER — Other Ambulatory Visit: Payer: 59

## 2016-02-17 LAB — FRUCTOSAMINE: Fructosamine: 263 umol/L (ref 0–285)

## 2016-02-20 ENCOUNTER — Encounter: Payer: Self-pay | Admitting: Endocrinology

## 2016-02-20 ENCOUNTER — Ambulatory Visit (INDEPENDENT_AMBULATORY_CARE_PROVIDER_SITE_OTHER): Payer: 59 | Admitting: Endocrinology

## 2016-02-20 ENCOUNTER — Other Ambulatory Visit: Payer: Self-pay | Admitting: Endocrinology

## 2016-02-20 VITALS — BP 126/68 | HR 69 | Wt 356.0 lb

## 2016-02-20 DIAGNOSIS — K7469 Other cirrhosis of liver: Secondary | ICD-10-CM | POA: Diagnosis not present

## 2016-02-20 DIAGNOSIS — E1165 Type 2 diabetes mellitus with hyperglycemia: Secondary | ICD-10-CM

## 2016-02-20 MED ORDER — PIOGLITAZONE HCL 15 MG PO TABS: 15.0000 mg | ORAL_TABLET | Freq: Every day | ORAL | Status: DC

## 2016-02-20 NOTE — Progress Notes (Signed)
Patient ID: Frank Cox, male   DOB: 04-Oct-1960, 55 y.o.   MRN: [id number]           Reason for Appointment: Follow-up for Type 2 Diabetes  Referring physician: Moreen Fowler  History of Present Illness:          Date of diagnosis of type 2 diabetes mellitus:   2010       Background history:   At the time of diagnosis he was treated with metformin which she thinks she took for about 3 years This was stopped when he had his gastric sleeve procedure in 2013; however he lost only about 40-50 pounds with this; his blood sugars did come back to normal After a few years his blood sugars started increasing and he was started on Amaryl His A1c was ranging from 7-7.4 in 2015 and 2016 although from outside lab was 6.5 in 11/15  Recent history:      His blood sugars had been progressively higher with A1c 9.9% on his first consultation In addition to Amaryl he was started on Trulicity and Invokamet, Trulicity was increased in 3/17 On his last visit his A1c was still relatively high at 7.8  Current management, blood sugar patterns and problems identified:    His blood sugars are relatively good in the morning fasting blood occasionally higher also  He has only a couple of readings after his evening meal which are not consistently high  His lab glucose was 254 with eating a fast food lunch and regular soft drink  Although he is aware of his diet needs and taking Trulicity is still not watching his diet consistently, he does have information from the dietitian from March  He does not complain of any nausea with taking Invokamet XR which was started on his last visit instead of regular metformin/Invokana  He is asking about his Invokamet XR pills coming out in the stool undigested  He was told to reduce Amaryl to half tablet in the morning because of low-normal sugars in the afternoon but he has not checked his readings and does not feel hypoglycemic  He is trying to walk when he can when he is  not working No recent weight loss  Non-insulin hypoglycemic drugs are:  Amaryl 4 mg bid , Trulicity 1.5 mg daily, Invokamet XR 50/1000, 2 tablets daily  Side effects from medications have been: None  Compliance with the medical regimen: Fair  Glucose monitoring:  done 1  times a day         Glucometer: Contour Blood Glucose readings  FASTING range 118-197 with average 144 POSTPRANDIAL after supper 130-172 with only 3 readings Overall MEDIAN 148  Self-care: The diet that the patient has been following is: variable He tries to avoid drinks with sugar but not consistently.  Usually eating at home in the evenings  Supper usually at 6  pm,  Dietician visit, most recent: 3/17               Exercise: walking 30 min, 2-3/7  Weight history:   Wt Readings from Last 3 Encounters:  02/20/16 356 lb (161.481 kg)  01/05/16 353 lb 12.8 oz (160.483 kg)  11/21/15 349 lb (158.305 kg)    Glycemic control:   Lab Results  Component Value Date   HGBA1C 7.8* 12/29/2015   HGBA1C 9.9 08/25/2015   HGBA1C 7.2* 09/26/2014   Lab Results  Component Value Date   MICROALBUR 18.9* 12/29/2015   CREATININE 1.34 02/16/2016    Lab  on 02/16/2016  Component Date Value Ref Range Status  . Sodium 02/16/2016 143  135 - 145 mEq/L Final  . Potassium 02/16/2016 3.4* 3.5 - 5.1 mEq/L Final  . Chloride 02/16/2016 112  96 - 112 mEq/L Final  . CO2 02/16/2016 20  19 - 32 mEq/L Final  . Glucose, Bld 02/16/2016 254* 70 - 99 mg/dL Final  . BUN 02/16/2016 8  6 - 23 mg/dL Final  . Creatinine, Ser 02/16/2016 1.34  0.40 - 1.50 mg/dL Final  . Calcium 02/16/2016 11.4* 8.4 - 10.5 mg/dL Final  . GFR 02/16/2016 58.56* >60.00 mL/min Final  . Fructosamine 02/16/2016 263  0 - 285 umol/L Final   Comment: Published reference interval for apparently healthy subjects between age 76 and 56 is 92 - 285 umol/L and in a poorly controlled diabetic population is 228 - 563 umol/L with a mean of 396 umol/L.      OTHER active  problems: See review of systems      Medication List       This list is accurate as of: 02/20/16 11:59 PM.  Always use your most recent med list.               acetaminophen 500 MG tablet  Commonly known as:  TYLENOL  Take 1,000 mg by mouth every 6 (six) hours as needed for mild pain.     b complex vitamins tablet  Take 1 tablet by mouth daily.     BAYER MICROLET LANCETS lancets  Use as instructed to check blood sugar 2 times per day dx code E11.9     cholecalciferol 1000 units tablet  Commonly known as:  VITAMIN D  Take 1,000 Units by mouth daily.     citalopram 40 MG tablet  Commonly known as:  CELEXA  Take 40 mg by mouth daily.     clonazePAM 1 MG tablet  Commonly known as:  KLONOPIN  Take 3 mg by mouth at bedtime.     diphenhydrAMINE 25 MG tablet  Commonly known as:  BENADRYL  Take 50 mg by mouth every 6 (six) hours as needed for allergies.     Dulaglutide 1.5 MG/0.5ML Sopn  Commonly known as:  TRULICITY  Inject weekly     glimepiride 4 MG tablet  Commonly known as:  AMARYL  Take 2 tablets (8 mg total) by mouth daily with breakfast.     glucose blood test strip  Commonly known as:  BAYER CONTOUR NEXT TEST  Use as instructed to check blood sugar 2 times daily dx code E11.9     INVOKAMET XR 50-1000 MG Tb24  Generic drug:  Canagliflozin-Metformin HCl ER  TAKE 1 TABLET BY MOUTH TWICE DAILY     lamoTRIgine 200 MG tablet  Commonly known as:  LAMICTAL  Take 200 mg by mouth at bedtime.     LATUDA 40 MG Tabs tablet  Generic drug:  lurasidone  Take 40 mg by mouth at bedtime.     lithium carbonate 450 MG CR tablet  Commonly known as:  ESKALITH  Take 1,800 mg by mouth at bedtime.     metoprolol succinate 25 MG 24 hr tablet  Commonly known as:  TOPROL-XL  Take 25 mg by mouth every morning.     pioglitazone 15 MG tablet  Commonly known as:  ACTOS  Take 1 tablet (15 mg total) by mouth daily.     QUEtiapine 100 MG tablet  Commonly known as:  SEROQUEL    Take 200  mg by mouth at bedtime.        Allergies: No Known Allergies  Past Medical History  Diagnosis Date  . Depression   . Anxiety   . CPAP (continuous positive airway pressure) dependence   . Poor circulation   . Hypertension     boarderline - elevated  . Fatigue   . Diabetes mellitus   . Kidney stone   . Generalized headaches   . Hearing loss   . Contact lens/glasses fitting   . Bruises easily   . Obesity   . Prostate cancer ([place]) 02/14/14    Gleason 4+3=7, volume 56 mL  . Arthritis   . Hyperlipidemia   . Bipolar 1 disorder ([place])     "well controlled on Lithium"  . S/P radiation therapy 07/03/2014 through 08/28/2014                                                        Prostate 7800 cGy in 40 sessions, seminal vesicles 5600 cGy in 40 sessions                          Past Surgical History  Procedure Laterality Date  . Laparoscopic gastric banding  09/09/09  . Prostate biopsy  02/14/14    Gleason 7, volume 56 mL    Family History  Problem Relation Age of Onset  . Arthritis Mother   . Alcohol abuse Mother   . Arthritis Father     Social History:  reports that he has never smoked. He has never used smokeless tobacco. He reports that he does not drink alcohol or use illicit drugs.    Review of Systems   HYPERCALCEMIA: Apparently this has been present since 2014 when his PTH level was 37 His level has been variable and now slightly better at 11.4, previously was up to 12.1. He is not having any Recent history of kidney stones or known osteopenia  Lab Results  Component Value Date   PTH 51 01/09/2016   CALCIUM 11.4* 02/16/2016   CAION 1.30 09/09/2009   PHOS 3.2 01/09/2016    Hepatic steatohepatitis He was told to have early cirrhosis at [facility] but no treatment offered  Lab Results  Component Value Date   ALT 58* 12/29/2015     Lipid history: LDL was 55 in 12/16, was on Lipitor then, this was stopped because of abnormal liver functions Last  LDL 108 done by PCP    No results found for: CHOL, HDL, LDLCALC, LDLDIRECT, TRIG, CHOLHDL          Most recent eye exam was 2016, reportedly normal  Most recent foot exam: 99991111  Diabetic complications: Appears to have peripheral neuropathy.  Has had increased urine microalbumin.  No reported retinopathy.     Review of Systems    Physical Examination:  BP 126/68 mmHg  Pulse 69  Wt 356 lb (161.481 kg)  ASSESSMENT:  Diabetes type 2, uncontrolled with BMI  50 See history of present illness for detailed discussion of current diabetes management, blood sugar patterns and problems identified With better compliance using the Invokamet XR fructosamine indicates overall better control, previous A1c 7.8 However as discussed above he has not been consistent with diet and not losing weight despite taking Trulicity He has done some  walking but probably not enough Has seen the dietitian already He is checking sugars mostly in the mornings, still occasionally high overnight  HYPERCALCEMIA: Likely to be from hyperparathyroidism, stable and calcium slightly better Continue observation alone  FATTY liver/early cirrhosis: He will benefit from Actos 15 mg daily to be started, discussed actions, benefits, possible side effects   PLAN:     Continue current medication regimen  More readings after breakfast and lunch as well as supper  Reduce Amaryl to half tablet in the morning if blood sugar at suppertime is below 80  Work harder to improve diet and making better choices  Follow-up in 8 weeks for reassessment and also recheck calcium  Consider checking 24 urine calcium  .  He will call if he has any side effects from Actos   Patient Instructions  Check blood sugars on waking up 3  times a week Also check blood sugars about 2 hours after a meal and do this after different meals by rotation  Recommended blood sugar levels on waking up is 90-130 and about 2 hours after meal is  130-160  Please bring your blood sugar monitor to each visit, thank you  Diet to be stricter  Cut Glimeperide at am if supper sugars <80   Counseling time on subjects discussed above is over 50% of today's 25 minute visit  Frank Cox 02/22/2016, 5:14 PM   Note: This office note was prepared with Dragon voice recognition system technology. Any transcriptional errors that result from this process are unintentional.

## 2016-02-20 NOTE — Patient Instructions (Addendum)
Check blood sugars on waking up 3  times a week Also check blood sugars about 2 hours after a meal and do this after different meals by rotation  Recommended blood sugar levels on waking up is 90-130 and about 2 hours after meal is 130-160  Please bring your blood sugar monitor to each visit, thank you  Diet to be stricter  Cut Glimeperide at am if supper sugars <80

## 2016-02-24 ENCOUNTER — Other Ambulatory Visit: Payer: Self-pay

## 2016-02-24 MED ORDER — GLUCOSE BLOOD VI STRP: ORAL_STRIP | Status: DC

## 2016-03-05 ENCOUNTER — Encounter: Payer: Self-pay | Admitting: Endocrinology

## 2016-03-19 ENCOUNTER — Other Ambulatory Visit: Payer: Self-pay | Admitting: Endocrinology

## 2016-04-16 ENCOUNTER — Other Ambulatory Visit (INDEPENDENT_AMBULATORY_CARE_PROVIDER_SITE_OTHER): Payer: 59

## 2016-04-16 DIAGNOSIS — E1165 Type 2 diabetes mellitus with hyperglycemia: Secondary | ICD-10-CM

## 2016-04-16 LAB — COMPREHENSIVE METABOLIC PANEL
ALT: 56 U/L — ABNORMAL HIGH (ref 0–53)
AST: 39 U/L — ABNORMAL HIGH (ref 0–37)
Albumin: 4.2 g/dL (ref 3.5–5.2)
Alkaline Phosphatase: 58 U/L (ref 39–117)
BUN: 10 mg/dL (ref 6–23)
CHLORIDE: 111 meq/L (ref 96–112)
CO2: 26 meq/L (ref 19–32)
Calcium: 11 mg/dL — ABNORMAL HIGH (ref 8.4–10.5)
Creatinine, Ser: 1.37 mg/dL (ref 0.40–1.50)
GFR: 57.04 mL/min — ABNORMAL LOW (ref >60.00)
GLUCOSE: 133 mg/dL — AB (ref 70–99)
POTASSIUM: 3.8 meq/L (ref 3.5–5.1)
SODIUM: 142 meq/L (ref 135–145)
Total Bilirubin: 0.4 mg/dL (ref 0.2–1.2)
Total Protein: 7.1 g/dL (ref 6.0–8.3)

## 2016-04-16 LAB — HEMOGLOBIN A1C: HEMOGLOBIN A1C: 5.5 % (ref 4.6–6.5)

## 2016-04-21 ENCOUNTER — Encounter: Payer: Self-pay | Admitting: Endocrinology

## 2016-04-21 ENCOUNTER — Ambulatory Visit (INDEPENDENT_AMBULATORY_CARE_PROVIDER_SITE_OTHER): Payer: 59 | Admitting: Endocrinology

## 2016-04-21 VITALS — BP 142/65 | HR 72 | Ht 70.0 in | Wt 357.0 lb

## 2016-04-21 DIAGNOSIS — E1165 Type 2 diabetes mellitus with hyperglycemia: Secondary | ICD-10-CM | POA: Diagnosis not present

## 2016-04-21 NOTE — Patient Instructions (Addendum)
Check blood sugars on waking up  3-4x weekly  Also check blood sugars about 2 hours after a meal and do this after different meals by rotation  Recommended blood sugar levels on waking up is 90-130 and about 2 hours after meal is 130-160  Please bring your blood sugar monitor to each visit, thank you  Reduce Glimeperide to 1/2 at dinner

## 2016-04-21 NOTE — Progress Notes (Signed)
Patient ID: Frank Cox, male   DOB: 06/28/1961, 55 y.o.   MRN: [id number]           Reason for Appointment: Follow-up for Type 2 Diabetes  Referring physician: Moreen Fowler  History of Present Illness:          Date of diagnosis of type 2 diabetes mellitus:   2010       Background history:   At the time of diagnosis he was treated with metformin which she thinks she took for about 3 years This was stopped when he had his gastric sleeve procedure in 2013; however he lost only about 40-50 pounds with this; his blood sugars did come back to normal After a few years his blood sugars started increasing and he was started on Amaryl His A1c was ranging from 7-7.4 in 2015 and 2016 although from outside lab was 6.5 in 11/15  Recent history:     A1c was 9.9% on his first consultation In addition to Amaryl he was started on Trulicity and Invokamet and in 6/17 Actos 15 mg was added On his last visit his A1c was still relatively high at 7.8 and now it is down to 5.5  Current management, blood sugar patterns and problems identified:    His blood sugars are mostly near normal in the morning fasting  He has done a few readings after supper and these are fairly good except for once it was 199  He has only a couple of readings after his evening meal which are not consistently high  His lab glucose was 133 in the afternoon  He says he is cutting back on carbohydrates significantly but not always on fat intake  Weight has not come down yet  He was told to reduce Amaryl to half tablet in the morning if he had lower readings in the afternoon but he has not had any symptoms and continues to take 4 mg twice a day  He is trying to walk when he can at work but this is not fast  Non-insulin hypoglycemic drugs are:  Amaryl 4 mg bid , Trulicity 1.5 mg daily, Invokamet XR 50/1000, 2 tablets daily  Side effects from medications have been: None  Compliance with the medical regimen: Fair  Glucose  monitoring:  done 1  times a day         Glucometer: Contour Blood Glucose readings from download  Mean values apply above for all meters except median for One Touch  PRE-MEAL Fasting Lunch Dinner Bedtime Overall  Glucose range: 71-164    94-199    Mean/median: 119    138  124     Self-care: The diet that the patient has been following is: Recently low carbohydrate He tries to avoid drinks with sugar but not consistently.  Usually eating at home in the evenings  Supper usually at 6  pm,  Dietician visit, most recent: 3/17               Exercise: walking 30 min, 2-3/7  Weight history:   Wt Readings from Last 3 Encounters:  04/21/16 (!) 357 lb (161.9 kg)  02/20/16 (!) 356 lb (161.5 kg)  01/05/16 (!) 353 lb 12.8 oz (160.5 kg)    Glycemic control:   Lab Results  Component Value Date   HGBA1C 5.5 04/16/2016   HGBA1C 7.8 (H) 12/29/2015   HGBA1C 9.9 08/25/2015   Lab Results  Component Value Date   MICROALBUR 18.9 (H) 12/29/2015   CREATININE 1.37  04/16/2016    Lab on 04/16/2016  Component Date Value Ref Range Status  . Hgb A1c MFr Bld 04/16/2016 5.5  4.6 - 6.5 % Final  . Sodium 04/16/2016 142  135 - 145 mEq/L Final  . Potassium 04/16/2016 3.8  3.5 - 5.1 mEq/L Final  . Chloride 04/16/2016 111  96 - 112 mEq/L Final  . CO2 04/16/2016 26  19 - 32 mEq/L Final  . Glucose, Bld 04/16/2016 133* 70 - 99 mg/dL Final  . BUN 04/16/2016 10  6 - 23 mg/dL Final  . Creatinine, Ser 04/16/2016 1.37  0.40 - 1.50 mg/dL Final  . Total Bilirubin 04/16/2016 0.4  0.2 - 1.2 mg/dL Final  . Alkaline Phosphatase 04/16/2016 58  39 - 117 U/L Final  . AST 04/16/2016 39* 0 - 37 U/L Final  . ALT 04/16/2016 56* 0 - 53 U/L Final  . Total Protein 04/16/2016 7.1  6.0 - 8.3 g/dL Final  . Albumin 04/16/2016 4.2  3.5 - 5.2 g/dL Final  . Calcium 04/16/2016 11.0* 8.4 - 10.5 mg/dL Final  . GFR 04/16/2016 57.04* >60.00 mL/min Final     OTHER active problems: See review of systems      Medication List         Accurate as of 04/21/16  8:53 AM. Always use your most recent med list.          acetaminophen 500 MG tablet Commonly known as:  TYLENOL Take 1,000 mg by mouth every 6 (six) hours as needed for mild pain.   b complex vitamins tablet Take 1 tablet by mouth daily.   BAYER MICROLET LANCETS lancets Use as instructed to check blood sugar 2 times per day dx code E11.9   cholecalciferol 1000 units tablet Commonly known as:  VITAMIN D Take 1,000 Units by mouth daily.   citalopram 40 MG tablet Commonly known as:  CELEXA Take 40 mg by mouth daily.   clonazePAM 1 MG tablet Commonly known as:  KLONOPIN Take 3 mg by mouth at bedtime.   diphenhydrAMINE 25 MG tablet Commonly known as:  BENADRYL Take 50 mg by mouth every 6 (six) hours as needed for allergies.   glimepiride 4 MG tablet Commonly known as:  AMARYL Take 2 tablets (8 mg total) by mouth daily with breakfast.   glucose blood test strip Commonly known as:  BAYER CONTOUR NEXT TEST USE AS DIRECTED TO TEST BLOOD SUGAR TWICE DAILY   INVOKAMET XR 50-1000 MG Tb24 Generic drug:  Canagliflozin-Metformin HCl ER TAKE 1 TABLET BY MOUTH TWICE DAILY   lamoTRIgine 200 MG tablet Commonly known as:  LAMICTAL Take 200 mg by mouth at bedtime.   LATUDA 40 MG Tabs tablet Generic drug:  lurasidone Take 40 mg by mouth at bedtime.   lithium carbonate 450 MG CR tablet Commonly known as:  ESKALITH Take 1,800 mg by mouth at bedtime.   metoprolol succinate 25 MG 24 hr tablet Commonly known as:  TOPROL-XL Take 25 mg by mouth every morning.   pioglitazone 15 MG tablet Commonly known as:  ACTOS Take 1 tablet (15 mg total) by mouth daily.   QUEtiapine 100 MG tablet Commonly known as:  SEROQUEL Take 200 mg by mouth at bedtime.   TRULICITY 1.5 0000000 Sopn Generic drug:  Dulaglutide INJECT 1 SYRINGE UNDER THE SKIN WEEKLY       Allergies: No Known Allergies  Past Medical History:  Diagnosis Date  . Anxiety   . Arthritis    . Bipolar 1 disorder (  [place])    "well controlled on Lithium"  . Bruises easily   . Contact lens/glasses fitting   . CPAP (continuous positive airway pressure) dependence   . Depression   . Diabetes mellitus   . Fatigue   . Generalized headaches   . Hearing loss   . Hyperlipidemia   . Hypertension    boarderline - elevated  . Kidney stone   . Obesity   . Poor circulation   . Prostate cancer ([place]) 02/14/14   Gleason 4+3=7, volume 56 mL  . S/P radiation therapy 07/03/2014 through 08/28/2014                                                       Prostate 7800 cGy in 40 sessions, seminal vesicles 5600 cGy in 40 sessions                          Past Surgical History:  Procedure Laterality Date  . LAPAROSCOPIC GASTRIC BANDING  09/09/09  . PROSTATE BIOPSY  02/14/14   Gleason 7, volume 56 mL    Family History  Problem Relation Age of Onset  . Arthritis Mother   . Alcohol abuse Mother   . Arthritis Father     Social History:  reports that he has never smoked. He has never used smokeless tobacco. He reports that he does not drink alcohol or use drugs.    Review of Systems   HYPERCALCEMIA: Apparently this has been present since 2014 when his PTH level was 37 His level has been variable and now slightly better at 11, previously was up to 12.1. He is not having any Recent history of kidney stones or known osteopenia  Lab Results  Component Value Date   PTH 51 01/09/2016   CALCIUM 11.0 (H) 04/16/2016   CAION 1.30 09/09/2009   PHOS 3.2 01/09/2016    Hepatic steatohepatitis He was told to have early cirrhosis at [facility] but no treatment Given  Lab Results  Component Value Date   ALT 56 (H) 04/16/2016     Lipid history: LDL was 55 in 12/16, was on Lipitor then, this was stopped because of abnormal liver functions Last LDL 108 done by PCP    No results found for: CHOL, HDL, LDLCALC, LDLDIRECT, TRIG, CHOLHDL          Most recent eye exam was 2016, reportedly  normal  Most recent foot exam: 99991111  Diabetic complications: Appears to have peripheral neuropathy.  Has had increased urine microalbumin.  No reported retinopathy.     Review of Systems    Physical Examination:  BP (!) 142/65   Pulse 72   Ht 5\' 10"  (1.778 m)   Wt (!) 357 lb (161.9 kg)   SpO2 95%   BMI 51.22 kg/m   ASSESSMENT:  Diabetes type 2, uncontrolled with BMI  51 See history of present illness for detailed discussion of current diabetes management, blood sugar patterns and problems identified With better compliance with his diet and starting Actos his blood sugars appear to be significantly better He has not lost any weight but he thinks he is cutting back on carbohydrates in his diet Has seen the dietitian before He can do better with exercise  His A1c is excellent at 5.5 and does not appear  to have any consistent high readings Morning readings are as low as 71 without hypoglycemia at any time   HYPERCALCEMIA: Likely to be from hyperparathyroidism, stable and calcium slightly better Continue observation alone  FATTY liver/early cirrhosis: He has slightly better AST and ALT and may be benefiting from Actos, continue Also needs weight loss   PLAN:     Continue current medication regimen but reduce Amaryl to half tablet in the evening  More readings after breakfast and lunch as well as supper, less in the morning  Repeat A1c in 3 months   There are no Patient Instructions on file for this visit.  Meta Kroenke 04/21/2016, 8:53 AM   Note: This office note was prepared with Dragon voice recognition system technology. Any transcriptional errors that result from this process are unintentional.

## 2016-05-24 ENCOUNTER — Other Ambulatory Visit: Payer: Self-pay | Admitting: Endocrinology

## 2016-06-21 ENCOUNTER — Other Ambulatory Visit: Payer: Self-pay | Admitting: Endocrinology

## 2016-07-18 ENCOUNTER — Other Ambulatory Visit: Payer: Self-pay | Admitting: Endocrinology

## 2016-07-19 ENCOUNTER — Other Ambulatory Visit (INDEPENDENT_AMBULATORY_CARE_PROVIDER_SITE_OTHER): Payer: 59

## 2016-07-19 DIAGNOSIS — E1165 Type 2 diabetes mellitus with hyperglycemia: Secondary | ICD-10-CM

## 2016-07-19 LAB — RENAL FUNCTION PANEL
ALBUMIN: 4.1 g/dL (ref 3.5–5.2)
BUN: 10 mg/dL (ref 6–23)
CHLORIDE: 116 meq/L — AB (ref 96–112)
CO2: 22 meq/L (ref 19–32)
Calcium: 11 mg/dL — ABNORMAL HIGH (ref 8.4–10.5)
Creatinine, Ser: 1.31 mg/dL (ref 0.40–1.50)
GFR: 60.01 mL/min (ref >60.00)
Glucose, Bld: 160 mg/dL — ABNORMAL HIGH (ref 70–99)
Phosphorus: 2.5 mg/dL (ref 2.3–4.6)
Potassium: 3.7 mEq/L (ref 3.5–5.1)
SODIUM: 143 meq/L (ref 135–145)

## 2016-07-19 LAB — VITAMIN D 25 HYDROXY (VIT D DEFICIENCY, FRACTURES): VITD: 37.93 ng/mL (ref 30.00–100.00)

## 2016-07-19 LAB — HEMOGLOBIN A1C: Hgb A1c MFr Bld: 5.5 % (ref 4.6–6.5)

## 2016-07-20 LAB — PARATHYROID HORMONE, INTACT (NO CA): PTH: 62 pg/mL (ref 15–65)

## 2016-07-22 ENCOUNTER — Encounter: Payer: Self-pay | Admitting: Endocrinology

## 2016-07-22 ENCOUNTER — Encounter: Payer: Self-pay | Admitting: *Deleted

## 2016-07-22 ENCOUNTER — Ambulatory Visit (INDEPENDENT_AMBULATORY_CARE_PROVIDER_SITE_OTHER): Payer: 59 | Admitting: Endocrinology

## 2016-07-22 VITALS — BP 152/70 | HR 79 | Ht 70.0 in | Wt 362.0 lb

## 2016-07-22 DIAGNOSIS — E782 Mixed hyperlipidemia: Secondary | ICD-10-CM

## 2016-07-22 DIAGNOSIS — E1165 Type 2 diabetes mellitus with hyperglycemia: Secondary | ICD-10-CM | POA: Diagnosis not present

## 2016-07-22 MED ORDER — FENOFIBRATE 145 MG PO TABS: 145.0000 mg | ORAL_TABLET | Freq: Every day | ORAL | 2 refills | Status: DC

## 2016-07-22 NOTE — Progress Notes (Signed)
Patient ID: Frank Cox, male   DOB: 1961/01/09, 54 y.o.   MRN: [id number]           Reason for Appointment: Follow-up for Type 2 Diabetes  Referring physician: Moreen Fowler  History of Present Illness:          Date of diagnosis of type 2 diabetes mellitus:   2010       Background history:   At the time of diagnosis he was treated with metformin which she thinks she took for about 3 years This was stopped when he had his gastric sleeve procedure in 2013; however he lost only about 40-50 pounds with this; his blood sugars did come back to normal After a few years his blood sugars started increasing and he was started on Amaryl His A1c was ranging from 7-7.4 in 2015 and 2016 although from outside lab was 6.5 in 11/15  Recent history:     A1c was 9.9% on his first consultation In addition to Amaryl he was started on Trulicity and Invokamet and in 6/17 Actos 15 mg was added  A1c is now consistent at 5.5  Current management, blood sugar patterns and problems identified:  He is now taking Amaryl only once a day instead of twice a day  Blood sugars and to be low normal before supper time with only one reading of 65 with minimal symptoms    His blood sugars are mostly near normal in the morning before breakfast  He has not done any readings after supper recently, previously as high as 199  He has been walking irregularly  He says he is not always able to watch his diet because of his mother doing the cooking   Has gained weight  Non-insulin hypoglycemic drugs are:  Amaryl 4 mg qd , Trulicity 1.5 mg daily, Invokamet XR 50/1000, 2 tablets daily, Actos 15 mg daily   Side effects from medications have been: None  Compliance with the medical regimen: Fair  Glucose monitoring:  done 1  times a day         Glucometer: Contour Blood Glucose readings from download  Mean values apply above for all meters except median for One Touch  PRE-MEAL Fasting Lunch Dinner Bedtime Overall    Glucose range: 98-123  101  65-97  122    Mean/median: 109     98     Self-care: The diet that the patient has been following is: Recently low carbohydrate He tries to avoid drinks with sugar but not consistently.  Usually eating at home in the evenings  Supper usually at 6  pm,  Dietician visit, most recent: 3/17               Exercise: walking 30 min, 2-3/7  Weight history:   Wt Readings from Last 3 Encounters:  07/22/16 (!) 362 lb (164.2 kg)  04/21/16 (!) 357 lb (161.9 kg)  02/20/16 (!) 356 lb (161.5 kg)    Glycemic control:   Lab Results  Component Value Date   HGBA1C 5.5 07/19/2016   HGBA1C 5.5 04/16/2016   HGBA1C 7.8 (H) 12/29/2015   Lab Results  Component Value Date   MICROALBUR 18.9 (H) 12/29/2015   CREATININE 1.31 07/19/2016    Lab on 07/19/2016  Component Date Value Ref Range Status  . Hgb A1c MFr Bld 07/19/2016 5.5  4.6 - 6.5 % Final  . Sodium 07/19/2016 143  135 - 145 mEq/L Final  . Potassium 07/19/2016 3.7  3.5 - 5.1  mEq/L Final  . Chloride 07/19/2016 116* 96 - 112 mEq/L Final  . CO2 07/19/2016 22  19 - 32 mEq/L Final  . Calcium 07/19/2016 11.0* 8.4 - 10.5 mg/dL Final  . Albumin 07/19/2016 4.1  3.5 - 5.2 g/dL Final  . BUN 07/19/2016 10  6 - 23 mg/dL Final  . Creatinine, Ser 07/19/2016 1.31  0.40 - 1.50 mg/dL Final  . Glucose, Bld 07/19/2016 160* 70 - 99 mg/dL Final  . Phosphorus 07/19/2016 2.5  2.3 - 4.6 mg/dL Final  . GFR 07/19/2016 60.01  >60.00 mL/min Final  . PTH 07/20/2016 62  15 - 65 pg/mL Final  . VITD 07/19/2016 37.93  30.00 - 100.00 ng/mL Final     OTHER active problems: See review of systems      Medication List       Accurate as of 07/22/16 12:16 PM. Always use your most recent med list.          acetaminophen 500 MG tablet Commonly known as:  TYLENOL Take 1,000 mg by mouth every 6 (six) hours as needed for mild pain.   b complex vitamins tablet Take 1 tablet by mouth daily.   BAYER MICROLET LANCETS lancets Use as  instructed to check blood sugar 2 times per day dx code E11.9   cholecalciferol 1000 units tablet Commonly known as:  VITAMIN D Take 1,000 Units by mouth daily.   citalopram 40 MG tablet Commonly known as:  CELEXA Take 40 mg by mouth daily.   clonazePAM 1 MG tablet Commonly known as:  KLONOPIN Take 3 mg by mouth at bedtime.   diphenhydrAMINE 25 MG tablet Commonly known as:  BENADRYL Take 50 mg by mouth every 6 (six) hours as needed for allergies.   fenofibrate 145 MG tablet Commonly known as:  TRICOR Take 1 tablet (145 mg total) by mouth daily.   glimepiride 4 MG tablet Commonly known as:  AMARYL Take 2 tablets (8 mg total) by mouth daily with breakfast.   glucose blood test strip Commonly known as:  BAYER CONTOUR NEXT TEST USE AS DIRECTED TO TEST BLOOD SUGAR TWICE DAILY   INVOKAMET XR 50-1000 MG Tb24 Generic drug:  Canagliflozin-Metformin HCl ER TAKE 1 TABLET BY MOUTH TWICE DAILY   INVOKAMET XR 50-1000 MG Tb24 Generic drug:  Canagliflozin-Metformin HCl ER TAKE 1 TABLET BY MOUTH TWICE DAILY   lamoTRIgine 200 MG tablet Commonly known as:  LAMICTAL Take 200 mg by mouth at bedtime.   LATUDA 40 MG Tabs tablet Generic drug:  lurasidone Take 40 mg by mouth at bedtime.   lithium carbonate 450 MG CR tablet Commonly known as:  ESKALITH Take 1,800 mg by mouth at bedtime.   metoprolol succinate 25 MG 24 hr tablet Commonly known as:  TOPROL-XL Take 25 mg by mouth every morning.   pioglitazone 15 MG tablet Commonly known as:  ACTOS TAKE 1 TABLET(15 MG) BY MOUTH DAILY   QUEtiapine 100 MG tablet Commonly known as:  SEROQUEL Take 200 mg by mouth at bedtime.   TRULICITY 1.5 0000000 Sopn Generic drug:  Dulaglutide INJECT 1 SYRINGE UNDER THE SKIN WEEKLY       Allergies: No Known Allergies  Past Medical History:  Diagnosis Date  . Anxiety   . Arthritis   . Bipolar 1 disorder ([place])    "well controlled on Lithium"  . Bruises easily   . Contact lens/glasses  fitting   . CPAP (continuous positive airway pressure) dependence   . Depression   . Diabetes  mellitus   . Fatigue   . Generalized headaches   . Hearing loss   . Hyperlipidemia   . Hypertension    boarderline - elevated  . Kidney stone   . Obesity   . Poor circulation   . Prostate cancer ([place]) 02/14/14   Gleason 4+3=7, volume 56 mL  . S/P radiation therapy 07/03/2014 through 08/28/2014                                                       Prostate 7800 cGy in 40 sessions, seminal vesicles 5600 cGy in 40 sessions                          Past Surgical History:  Procedure Laterality Date  . LAPAROSCOPIC GASTRIC BANDING  09/09/09  . PROSTATE BIOPSY  02/14/14   Gleason 7, volume 56 mL    Family History  Problem Relation Age of Onset  . Arthritis Mother   . Alcohol abuse Mother   . Arthritis Father     Social History:  reports that he has never smoked. He has never used smokeless tobacco. He reports that he does not drink alcohol or use drugs.    Review of Systems   HYPERCALCEMIA: Apparently this has been present since 2014 when his PTH level was 37 His level has been variable and now slightly better at 11, previously was up to 12.1. He is not having any Recent history of kidney stones or known osteopenia  Lab Results  Component Value Date   PTH 62 07/19/2016   CALCIUM 11.0 (H) 07/19/2016   CAION 1.30 09/09/2009   PHOS 2.5 07/19/2016    Hepatic steatohepatitis He was told to have early cirrhosis at [facility] but no treatment Given  Lab Results  Component Value Date   ALT 56 (H) 04/16/2016     Lipid history: LDL was 55 in 12/16, was on Lipitor then, this was stopped because of abnormal liver functions Last LDL 84 done by PCP    No results found for: CHOL, HDL, LDLCALC, LDLDIRECT, TRIG, CHOLHDL          Most recent eye exam was 2016, reportedly normal  Most recent foot exam: [id number]  Diabetic complications: Appears to have peripheral neuropathy.  Has had  increased urine microalbumin.  No reported retinopathy.     Review of Systems    Physical Examination:  BP (!) 152/70   Pulse 79   Ht 5\' 10"  (1.778 m)   Wt (!) 362 lb (164.2 kg)   SpO2 95%   BMI 51.94 kg/m   ASSESSMENT:  Diabetes type 2, uncontrolled with BMI  51 See history of present illness for detailed discussion of current diabetes management, blood sugar patterns and problems identified With better compliance with his diet and starting Actos his blood sugars appear to be significantly better He has not lost any weight but he thinks he is cutting back on carbohydrates in his diet Has seen the dietitian before He can do better with exercise  His A1c is excellent at 5.5 and does not appear to have any consistent high readings Morning readings are as low as 71 without hypoglycemia at any time   HYPERCALCEMIA: Likely to be from hyperparathyroidism, stable and calcium slightly better Continue observation alone  FATTY liver/early cirrhosis: He has slightly better AST and ALT and may be benefiting from Actos, continue Also needs weight loss   PLAN:     Continue current medication regimen but reduce Amaryl to half tablet in the evening  More readings after breakfast and lunch as well as supper, less in the morning  Repeat A1c in 3 months   Patient Instructions  Check blood sugars on waking up  3x per week  Also check blood sugars about 2 hours after a meal and do this after different meals by rotation  Recommended blood sugar levels on waking up is 90-130 and about 2 hours after meal is 130-160  Please bring your blood sugar monitor to each visit, thank you  Glimeperide 1/2 twice daily  Walk 2x daily    Illene Sweeting 07/22/2016, 12:16 PM   Note: This office note was prepared with Estate agent. Any transcriptional errors that result from this process are unintentional. Patient ID: Frank Cox, male   DOB: 1961/05/08, 55 y.o.    MRN: [id number]           Reason for Appointment: Follow-up for Type 2 Diabetes  Referring physician: Moreen Fowler  History of Present Illness:          Date of diagnosis of type 2 diabetes mellitus:   2010       Background history:   At the time of diagnosis he was treated with metformin which she thinks she took for about 3 years This was stopped when he had his gastric sleeve procedure in 2013; however he lost only about 40-50 pounds with this; his blood sugars did come back to normal After a few years his blood sugars started increasing and he was started on Amaryl His A1c was ranging from 7-7.4 in 2015 and 2016 although from outside lab was 6.5 in 11/15  Recent history:     A1c was 9.9% on his first consultation In addition to Amaryl he was started on Trulicity and Invokamet and in 6/17 Actos 15 mg was added On his last visit his A1c was still relatively high at 7.8 and now it is down to 5.5  Current management, blood sugar patterns and problems identified:    His blood sugars are mostly near normal in the morning fasting  He has done a few readings after supper and these are fairly good except for once it was 199  He has only a couple of readings after his evening meal which are not consistently high  His lab glucose was 133 in the afternoon  He says he is cutting back on carbohydrates significantly but not always on fat intake  Weight has not come down yet  He was told to reduce Amaryl to half tablet in the morning if he had lower readings in the afternoon but he has not had any symptoms and continues to take 4 mg twice a day  He is trying to walk when he can at work but this is not fast  Non-insulin hypoglycemic drugs are:  Amaryl 4 mg bid , Trulicity 1.5 mg daily, Invokamet XR 50/1000, 2 tablets daily  Side effects from medications have been: None  Compliance with the medical regimen: Fair  Glucose monitoring:  done 1  times a day         Glucometer: Contour Blood  Glucose readings from download  Mean values apply above for all meters except median for One Touch  PRE-MEAL Fasting Lunch Dinner Bedtime Overall  Glucose range: 71-164    94-199    Mean/median: 119    138  124     Self-care: The diet that the patient has been following is: Recently low carbohydrate He tries to avoid drinks with sugar but not consistently.  Usually eating at home in the evenings  Supper usually at 6  pm,  Dietician visit, most recent: 3/17               Exercise: walking 30 min, 2-3/7  Weight history:   Wt Readings from Last 3 Encounters:  07/22/16 (!) 362 lb (164.2 kg)  04/21/16 (!) 357 lb (161.9 kg)  02/20/16 (!) 356 lb (161.5 kg)    Glycemic control:   Lab Results  Component Value Date   HGBA1C 5.5 07/19/2016   HGBA1C 5.5 04/16/2016   HGBA1C 7.8 (H) 12/29/2015   Lab Results  Component Value Date   MICROALBUR 18.9 (H) 12/29/2015   CREATININE 1.31 07/19/2016    Lab on 07/19/2016  Component Date Value Ref Range Status  . Hgb A1c MFr Bld 07/19/2016 5.5  4.6 - 6.5 % Final  . Sodium 07/19/2016 143  135 - 145 mEq/L Final  . Potassium 07/19/2016 3.7  3.5 - 5.1 mEq/L Final  . Chloride 07/19/2016 116* 96 - 112 mEq/L Final  . CO2 07/19/2016 22  19 - 32 mEq/L Final  . Calcium 07/19/2016 11.0* 8.4 - 10.5 mg/dL Final  . Albumin 07/19/2016 4.1  3.5 - 5.2 g/dL Final  . BUN 07/19/2016 10  6 - 23 mg/dL Final  . Creatinine, Ser 07/19/2016 1.31  0.40 - 1.50 mg/dL Final  . Glucose, Bld 07/19/2016 160* 70 - 99 mg/dL Final  . Phosphorus 07/19/2016 2.5  2.3 - 4.6 mg/dL Final  . GFR 07/19/2016 60.01  >60.00 mL/min Final  . PTH 07/20/2016 62  15 - 65 pg/mL Final  . VITD 07/19/2016 37.93  30.00 - 100.00 ng/mL Final     OTHER active problems: See review of systems      Medication List       Accurate as of 07/22/16 12:16 PM. Always use your most recent med list.          acetaminophen 500 MG tablet Commonly known as:  TYLENOL Take 1,000 mg by mouth every  6 (six) hours as needed for mild pain.   b complex vitamins tablet Take 1 tablet by mouth daily.   BAYER MICROLET LANCETS lancets Use as instructed to check blood sugar 2 times per day dx code E11.9   cholecalciferol 1000 units tablet Commonly known as:  VITAMIN D Take 1,000 Units by mouth daily.   citalopram 40 MG tablet Commonly known as:  CELEXA Take 40 mg by mouth daily.   clonazePAM 1 MG tablet Commonly known as:  KLONOPIN Take 3 mg by mouth at bedtime.   diphenhydrAMINE 25 MG tablet Commonly known as:  BENADRYL Take 50 mg by mouth every 6 (six) hours as needed for allergies.   fenofibrate 145 MG tablet Commonly known as:  TRICOR Take 1 tablet (145 mg total) by mouth daily.   glimepiride 4 MG tablet Commonly known as:  AMARYL Take 2 tablets (8 mg total) by mouth daily with breakfast.   glucose blood test strip Commonly known as:  BAYER CONTOUR NEXT TEST USE AS DIRECTED TO TEST BLOOD SUGAR TWICE DAILY   INVOKAMET XR 50-1000 MG Tb24 Generic drug:  Canagliflozin-Metformin HCl ER TAKE 1 TABLET BY MOUTH TWICE DAILY   INVOKAMET XR  50-1000 MG Tb24 Generic drug:  Canagliflozin-Metformin HCl ER TAKE 1 TABLET BY MOUTH TWICE DAILY   lamoTRIgine 200 MG tablet Commonly known as:  LAMICTAL Take 200 mg by mouth at bedtime.   LATUDA 40 MG Tabs tablet Generic drug:  lurasidone Take 40 mg by mouth at bedtime.   lithium carbonate 450 MG CR tablet Commonly known as:  ESKALITH Take 1,800 mg by mouth at bedtime.   metoprolol succinate 25 MG 24 hr tablet Commonly known as:  TOPROL-XL Take 25 mg by mouth every morning.   pioglitazone 15 MG tablet Commonly known as:  ACTOS TAKE 1 TABLET(15 MG) BY MOUTH DAILY   QUEtiapine 100 MG tablet Commonly known as:  SEROQUEL Take 200 mg by mouth at bedtime.   TRULICITY 1.5 0000000 Sopn Generic drug:  Dulaglutide INJECT 1 SYRINGE UNDER THE SKIN WEEKLY       Allergies: No Known Allergies  Past Medical History:  Diagnosis  Date  . Anxiety   . Arthritis   . Bipolar 1 disorder ([place])    "well controlled on Lithium"  . Bruises easily   . Contact lens/glasses fitting   . CPAP (continuous positive airway pressure) dependence   . Depression   . Diabetes mellitus   . Fatigue   . Generalized headaches   . Hearing loss   . Hyperlipidemia   . Hypertension    boarderline - elevated  . Kidney stone   . Obesity   . Poor circulation   . Prostate cancer ([place]) 02/14/14   Gleason 4+3=7, volume 56 mL  . S/P radiation therapy 07/03/2014 through 08/28/2014                                                       Prostate 7800 cGy in 40 sessions, seminal vesicles 5600 cGy in 40 sessions                          Past Surgical History:  Procedure Laterality Date  . LAPAROSCOPIC GASTRIC BANDING  09/09/09  . PROSTATE BIOPSY  02/14/14   Gleason 7, volume 56 mL    Family History  Problem Relation Age of Onset  . Arthritis Mother   . Alcohol abuse Mother   . Arthritis Father     Social History:  reports that he has never smoked. He has never used smokeless tobacco. He reports that he does not drink alcohol or use drugs.    Review of Systems   HYPERCALCEMIA: Apparently this has been present since 2014 when his PTH level was 37 His level has been variable and now  stable at 11 Highest reading has been 12.1 He is not having any Recent history of kidney stones or known osteopenia  Repeat PTH now is relatively higher but still upper normal  Lab Results  Component Value Date   PTH 62 07/19/2016   CALCIUM 11.0 (H) 07/19/2016   CAION 1.30 09/09/2009   PHOS 2.5 07/19/2016    Hepatic steatohepatitis He was told to have early cirrhosis at [facility] but no treatment Suggested  Lab Results  Component Value Date   ALT 56 (H) 04/16/2016     Lipid history: LDL was 55 in 12/16, was on Lipitor then, this was stopped because of abnormal liver functions Last LDL 84 done  by PCP However triglycerides were 344 fasting and  he is not on any medications for this   No results found for: CHOL, HDL, LDLCALC, LDLDIRECT, TRIG, CHOLHDL          Most recent eye exam was 2016, reportedly normal  Most recent foot exam: [id number]  Diabetic complications: Appears to have peripheral neuropathy.  Has had increased urine microalbumin.  No reported retinopathy.     ?  HYPERTENSION: His blood pressure has been overall Q000111Q systolic and is going to see his cardiologist for further management, has been referred by PCP   Physical Examination:  BP (!) 152/70   Pulse 79   Ht 5\' 10"  (1.778 m)   Wt (!) 362 lb (164.2 kg)   SpO2 95%   BMI 51.94 kg/m   ASSESSMENT/PLAN:  Diabetes type 2, uncontrolled with BMI  51 See history of present illness for detailed discussion of current diabetes management, blood sugar patterns and problems identified  Although his blood sugars are excellent now and he is able to cut back on his Amaryl to 1 tablet he is starting  to gain weight No hypoglycemia but his blood sugars are low normal at suppertime Not checking after supper He thinks he can do better and his diet but is not motivated and his home cooking is not optimal  With better compliance with his diet and more regular exercise he may be referred to lose some weight and this was encouraged strongly Also need to check more readings after supper For now will have him let the Amaryl to half tablet twice a day Have recommended consultation with dietitian but he does not want to do so Discussed need to increase exercise and emphasized importance of weight loss  HYPERCALCEMIA: This is  from hyperparathyroidism, stable and calcium unchanged from before at 11.0  Continue observation alone  FATTY liver/early cirrhosis: He has been on Actos, currently reports of liver functions from PCP not available Also needs weight loss  HYPERTRIGLYCERIDEMIA: His triglycerides are over 300 fasting and he will start fenofibrate 145 minute M  daily  OBESITY: Needs more exercise, also consider increasing dose of Invokana    Patient Instructions  Check blood sugars on waking up  3x per week  Also check blood sugars about 2 hours after a meal and do this after different meals by rotation  Recommended blood sugar levels on waking up is 90-130 and about 2 hours after meal is 130-160  Please bring your blood sugar monitor to each visit, thank you  Glimeperide 1/2 twice daily  Walk 2x daily   Counseling time on subjects discussed above is over 50% of today's 25 minute visit  Ardell Aaronson 07/22/2016, 12:16 PM   Note: This office note was prepared with Estate agent. Any transcriptional errors that result from this process are unintentional.

## 2016-07-22 NOTE — Patient Instructions (Addendum)
Check blood sugars on waking up  3x per week  Also check blood sugars about 2 hours after a meal and do this after different meals by rotation  Recommended blood sugar levels on waking up is 90-130 and about 2 hours after meal is 130-160  Please bring your blood sugar monitor to each visit, thank you  Glimeperide 1/2 twice daily  Walk 2x daily

## 2016-08-02 ENCOUNTER — Ambulatory Visit: Payer: 59 | Admitting: Cardiology

## 2016-09-17 ENCOUNTER — Other Ambulatory Visit: Payer: Self-pay | Admitting: Endocrinology

## 2016-09-20 ENCOUNTER — Encounter: Payer: Self-pay | Admitting: *Deleted

## 2016-09-27 ENCOUNTER — Ambulatory Visit: Payer: 59 | Admitting: Cardiology

## 2016-09-30 NOTE — Progress Notes (Signed)
Cardiology Office Note    Date:  10/01/2016   ID:  Frank Cox, DOB Jan 02, 1961, MRN [id number]  PCP:  Gara Kroner, MD  Cardiologist:  Fransico Him, MD   Chief Complaint  Patient presents with  . Hypertension  . Sleep Apnea    History of Present Illness:  Frank Cox is a 56 y.o. male with a history of bipolar disorder, OSA on CPAP, DM, HTN and hyperlipidemia who is referred today for evaluation of HTN and followup of OSA.  He was seen by his PCP recently.  He is currently on metoprolol and thinks that he has been intolerant to ACE I.  He cannot take an ARB due to his lithium and amlodipine is contraindicated with Latuda and Seroquel.  He is using his CPAP as prescribed.  He is now referred to Cardiology for further help with treatment of HTN.  He denies any chest pain, SOB, DOE (except with walking), dizziness, palpitations or syncope. He occasionally has some mild LE edema. He is tolerating his CPAP well.  He tolerates the nasal mask and feels the pressure is adequate.  He feels rested in the am and has no significant daytime sleepiness.  He does not think that he snores.  He has no significant mouth or nasal dryness.   Past Medical History:  Diagnosis Date  . Anxiety   . Arthritis   . Benign essential HTN 10/01/2016  . Bipolar 1 disorder ([place])    "well controlled on Lithium"  . Bruises easily   . Contact lens/glasses fitting   . Depression   . Diabetes mellitus   . Fatigue   . Generalized headaches   . Hearing loss   . Hyperlipidemia   . Hypertension    boarderline - elevated  . Kidney stone   . Obesity   . OSA on CPAP   . Poor circulation   . Prostate cancer ([place]) 02/14/14   Gleason 4+3=7, volume 56 mL  . S/P radiation therapy 07/03/2014 through 08/28/2014                                                       Prostate 7800 cGy in 40 sessions, seminal vesicles 5600 cGy in 40 sessions                          Past Surgical History:  Procedure Laterality Date    . LAPAROSCOPIC GASTRIC BANDING  09/09/09  . PROSTATE BIOPSY  02/14/14   Gleason 7, volume 56 mL    Current Medications: Outpatient Medications Prior to Visit  Medication Sig Dispense Refill  . acetaminophen (TYLENOL) 500 MG tablet Take 1,000 mg by mouth every 6 (six) hours as needed for mild pain.    Marland Kitchen b complex vitamins tablet Take 1 tablet by mouth daily.    Marland Kitchen BAYER MICROLET LANCETS lancets Use as instructed to check blood sugar 2 times per day dx code E11.9 100 each 2  . cholecalciferol (VITAMIN D) 1000 units tablet Take 1,000 Units by mouth daily.    . citalopram (CELEXA) 40 MG tablet Take 40 mg by mouth daily.      . clonazePAM (KLONOPIN) 1 MG tablet Take 3 mg by mouth at bedtime.     . diphenhydrAMINE (BENADRYL) 25 MG tablet Take  50 mg by mouth every 6 (six) hours as needed for allergies.    . fenofibrate (TRICOR) 145 MG tablet Take 1 tablet (145 mg total) by mouth daily. 30 tablet 2  . glimepiride (AMARYL) 4 MG tablet Take 2 tablets (8 mg total) by mouth daily with breakfast. (Patient taking differently: Take 4 mg by mouth daily with breakfast. ) 60 tablet 0  . glucose blood (BAYER CONTOUR NEXT TEST) test strip USE AS DIRECTED TO TEST BLOOD SUGAR TWICE DAILY 50 each 3  . INVOKAMET XR 50-1000 MG TB24 TAKE 1 TABLET BY MOUTH TWICE DAILY 60 tablet 3  . lamoTRIgine (LAMICTAL) 200 MG tablet Take 200 mg by mouth at bedtime.     Marland Kitchen lithium carbonate (ESKALITH) 450 MG CR tablet Take 1,800 mg by mouth at bedtime.     Marland Kitchen lurasidone (LATUDA) 40 MG TABS Take 40 mg by mouth at bedtime.     . metoprolol succinate (TOPROL-XL) 25 MG 24 hr tablet Take 25 mg by mouth every morning.     . pioglitazone (ACTOS) 15 MG tablet TAKE 1 TABLET(15 MG) BY MOUTH DAILY 30 tablet 3  . QUEtiapine (SEROQUEL) 100 MG tablet Take 200 mg by mouth at bedtime.     . TRULICITY 1.5 0000000 SOPN INJECT 1 SYRINGE UNDER THE SKIN WEEKLY 2 mL 0   No facility-administered medications prior to visit.      Allergies:   Patient has  no known allergies.   Social History   Social History  . Marital status: Single    Spouse name: N/A  . Number of children: N/A  . Years of education: N/A   Social History Main Topics  . Smoking status: Never Smoker  . Smokeless tobacco: Never Used  . Alcohol use No  . Drug use: No  . Sexual activity: Not Asked   Other Topics Concern  . None   Social History Narrative  . None     Family History:  The patient's family history includes Alcohol abuse in his mother; Arthritis in his father and mother.   ROS:   Please see the history of present illness.    ROS All other systems reviewed and are negative.  No flowsheet data found.     PHYSICAL EXAM:   VS:  BP (!) 160/60   Pulse 83   Ht 5\' 10"  (1.778 m)   Wt (!) 362 lb (164.2 kg)   BMI 51.94 kg/m    GEN: Well nourished, well developed, in no acute distress  HEENT: normal  Neck: no JVD, carotid bruits, or masses Cardiac: RRR; no murmurs, rubs, or gallops,no edema.  Intact distal pulses bilaterally.  Respiratory:  clear to auscultation bilaterally, normal work of breathing GI: soft, nontender, nondistended, + BS MS: no deformity or atrophy  Skin: warm and dry, no rash Neuro:  Alert and Oriented x 3, Strength and sensation are intact Psych: euthymic mood, full affect  Wt Readings from Last 3 Encounters:  10/01/16 (!) 362 lb (164.2 kg)  07/22/16 (!) 362 lb (164.2 kg)  04/21/16 (!) 357 lb (161.9 kg)      Studies/Labs Reviewed:   EKG:  EKG is ordered today.  The ekg ordered today demonstrates NSR at 83bpm with nonspecific ST abnormality  Recent Labs: 11/17/2015: Hemoglobin 13.3; Platelets 261 01/09/2016: TSH 1.72 04/16/2016: ALT 56 07/19/2016: BUN 10; Creatinine, Ser 1.31; Potassium 3.7; Sodium 143   Lipid Panel No results found for: CHOL, TRIG, HDL, CHOLHDL, VLDL, LDLCALC, LDLDIRECT  Additional studies/ records  that were reviewed today include:  OV notes from PCP    ASSESSMENT:    1. Benign essential HTN    2. OSA (obstructive sleep apnea)   3. Morbid obesity ([place])      PLAN:  In order of problems listed above:  1.   HTN - BP is elevated on exam today. He is compliant with his CPAP.  I think his HTN is related to significant obesity.  I will increase metoprolol to 50mg  daily and followup in HTN clinic in 1 week.  May need to add on a diuretic if BP not controlled with increased BB. OSA - the patient is tolerating PAP therapy well without any problems.  The patient has been using and benefiting from CPAP use and will continue to benefit from therapy. He will get a download from [facility]. Morbid obesity - He had gastric lapband surgery but unfortunately gained his weight back.  I have encouraged him to get into a routine exercise program and cut back on carbs and portions.      Medication Adjustments/Labs and Tests Ordered: Current medicines are reviewed at length with the patient today.  Concerns regarding medicines are outlined above.  Medication changes, Labs and Tests ordered today are listed in the Patient Instructions below.  There are no Patient Instructions on file for this visit.   Signed, Fransico Him, MD  10/01/2016 9:16 AM    [facility] [address], [place], [place]  [address] Phone: [phone]; Fax: [phone]

## 2016-10-01 ENCOUNTER — Encounter: Payer: Self-pay | Admitting: Cardiology

## 2016-10-01 ENCOUNTER — Ambulatory Visit (INDEPENDENT_AMBULATORY_CARE_PROVIDER_SITE_OTHER): Payer: 59 | Admitting: Cardiology

## 2016-10-01 VITALS — BP 160/60 | HR 83 | Ht 70.0 in | Wt 362.0 lb

## 2016-10-01 DIAGNOSIS — G4733 Obstructive sleep apnea (adult) (pediatric): Secondary | ICD-10-CM | POA: Diagnosis not present

## 2016-10-01 DIAGNOSIS — I10 Essential (primary) hypertension: Secondary | ICD-10-CM | POA: Diagnosis not present

## 2016-10-01 HISTORY — DX: Essential (primary) hypertension: I10

## 2016-10-01 MED ORDER — METOPROLOL SUCCINATE ER 50 MG PO TB24: 50.0000 mg | ORAL_TABLET | ORAL | 11 refills | Status: DC

## 2016-10-01 NOTE — Patient Instructions (Addendum)
Medication Instructions:  Your physician has recommended you make the following change in your medication:  1. Increase Toprol ( 50 mg ) daily, Medication sent in today to patient's requested pharmacy.    Labwork: -None  Testing/Procedures: -None  Follow-Up: Your physician wants you to follow-up in: 1 year with Dr.Turner.  You will receive a reminder letter in the mail two months in advance. If you don't receive a letter, please call our office to schedule the follow-up appointment.   Any Other Special Instructions Will Be Listed Below (If Applicable).  Patient will call back to schedule appointment for HTN Clinic @ [phone].  Patient will go to [place] get sleep card downloaded and fax results to Dr. Theodosia Blender office @ [phone].    If you need a refill on your cardiac medications before your next appointment, please call your pharmacy.

## 2016-10-07 ENCOUNTER — Telehealth: Payer: Self-pay | Admitting: *Deleted

## 2016-10-07 ENCOUNTER — Ambulatory Visit (INDEPENDENT_AMBULATORY_CARE_PROVIDER_SITE_OTHER): Payer: 59 | Admitting: Pharmacist

## 2016-10-07 VITALS — BP 162/60 | HR 81 | Wt 362.8 lb

## 2016-10-07 DIAGNOSIS — I10 Essential (primary) hypertension: Secondary | ICD-10-CM | POA: Diagnosis not present

## 2016-10-07 MED ORDER — METOPROLOL SUCCINATE ER 100 MG PO TB24
100.0000 mg | ORAL_TABLET | Freq: Every day | ORAL | 11 refills | Status: DC
Start: 2016-10-07 — End: 2016-12-16

## 2016-10-07 NOTE — Patient Instructions (Addendum)
Return for a follow up appointment in 3-4 weeks  Check your blood pressure at home daily (if able) and keep record of the readings.  Take your BP meds as follows: INCREASE metoprolol dose to 100mg  daily (FOUR tablets of 25mg  OR TWO tablets of 50mg  daily)  PLEASE call the Pharmacist, Tana Coast, to make your follow up appointment [phone].   Bring all of your meds, your BP cuff and your record of home blood pressures to your next appointment.  Exercise as you're able, try to walk approximately 30 minutes per day.  Keep salt intake to a minimum, especially watch canned and prepared boxed foods.  Eat more fresh fruits and vegetables and fewer canned items.  Avoid eating in fast food restaurants.    HOW TO TAKE YOUR BLOOD PRESSURE: . Rest 5 minutes before taking your blood pressure. .  Don't smoke or drink caffeinated beverages for at least 30 minutes before. . Take your blood pressure before (not after) you eat. . Sit comfortably with your back supported and both feet on the floor (don't cross your legs). . Elevate your arm to heart level on a table or a desk. . Use the proper sized cuff. It should fit smoothly and snugly around your bare upper arm. There should be enough room to slip a fingertip under the cuff. The bottom edge of the cuff should be 1 inch above the crease of the elbow. . Ideally, take 3 measurements at one sitting and record the average.

## 2016-10-07 NOTE — Progress Notes (Signed)
Patient ID: Frank Cox                 DOB: 1961/02/11                      MRN: [id number]     HPI: Frank Cox is a 56 y.o. male patient of Dr. Radford Pax with PMH below who presents today for hypertension evaluation. At his most recent OV with Dr. Radford Pax his pressure was elevated to 160/60. He is currently on metoprolol and thinks that he has been intolerant to ACE I.  He cannot take an ARB due to his lithium and amlodipine is contraindicated with Latuda and Seroquel. His metoprolol was increased to 50mg  daily and it was recommended to start a diuretic if pressure still elevated.   He presents today and states that he has done well on higher dose of metoprolol.    Cardiac Hx: HTN, OSA on CPAP, HLD, DM2, s/plapband in 2011, bipolar   Current HTN meds:  Metoprolol succinate 50mg  daily   Previously tried: none  BP goal: <130/80  Family History: He states there is no history of high blood pressure in his family and his mother and father are both still living.   Social History: He denies tobacco and states he has had only 1 beer in the last year to celebrate his birthday.   Diet: He eats most of his meals from home. He does not add salt to his food. He states he has a soda "problem" and drinks 2 liters of soda per day. Had a discussion about cutting back on this and he states he will try to start limiting.   Exercise: No exercise and works a sedentary job. We had a lengthy discussion about slowly increasing his exercise. He states he knows a course through the plant at work that he could start walking. Since he is very hesitant about exercise, but agreed that it would likely benefit his overall health. He was willing to try to walk the course at work one time once a week.   Home BP readings:  No home cuff but is able to monitor at work.   Wt Readings from Last 3 Encounters:  10/07/16 (!) 362 lb 12 oz (164.5 kg)  10/01/16 (!) 362 lb (164.2 kg)  07/22/16 (!) 362 lb (164.2 kg)   BP  Readings from Last 3 Encounters:  10/07/16 (!) 162/60  10/01/16 (!) 160/60  07/22/16 (!) 152/70   Pulse Readings from Last 3 Encounters:  10/07/16 81  10/01/16 83  07/22/16 79    Renal function: CrCl cannot be calculated (Patient's most recent lab result is older than the maximum 21 days allowed.).  Past Medical History:  Diagnosis Date  . Anxiety   . Arthritis   . Benign essential HTN 10/01/2016  . Bipolar 1 disorder ([place])    "well controlled on Lithium"  . Bruises easily   . Contact lens/glasses fitting   . Depression   . Diabetes mellitus   . Fatigue   . Generalized headaches   . Hearing loss   . Hyperlipidemia   . Hypertension    boarderline - elevated  . Kidney stone   . Obesity   . OSA on CPAP   . Poor circulation   . Prostate cancer ([place]) 02/14/14   Gleason 4+3=7, volume 56 mL  . S/P radiation therapy 07/03/2014 through 08/28/2014  Prostate 7800 cGy in 40 sessions, seminal vesicles 5600 cGy in 40 sessions                          Current Outpatient Prescriptions on File Prior to Visit  Medication Sig Dispense Refill  . acetaminophen (TYLENOL) 500 MG tablet Take 1,000 mg by mouth every 6 (six) hours as needed for mild pain.    Marland Kitchen b complex vitamins tablet Take 1 tablet by mouth daily.    Marland Kitchen BAYER MICROLET LANCETS lancets Use as instructed to check blood sugar 2 times per day dx code E11.9 100 each 2  . cholecalciferol (VITAMIN D) 1000 units tablet Take 1,000 Units by mouth daily.    . citalopram (CELEXA) 40 MG tablet Take 40 mg by mouth daily.      . clonazePAM (KLONOPIN) 1 MG tablet Take 3 mg by mouth at bedtime.     . fenofibrate (TRICOR) 145 MG tablet Take 1 tablet (145 mg total) by mouth daily. 30 tablet 2  . glimepiride (AMARYL) 4 MG tablet Take 2 tablets (8 mg total) by mouth daily with breakfast. (Patient taking differently: Take 4 mg by mouth daily with breakfast. ) 60 tablet 0  . glucose blood (BAYER  CONTOUR NEXT TEST) test strip USE AS DIRECTED TO TEST BLOOD SUGAR TWICE DAILY 50 each 3  . INVOKAMET XR 50-1000 MG TB24 TAKE 1 TABLET BY MOUTH TWICE DAILY 60 tablet 3  . lamoTRIgine (LAMICTAL) 200 MG tablet Take 200 mg by mouth at bedtime.     Marland Kitchen lithium carbonate (ESKALITH) 450 MG CR tablet Take 1,800 mg by mouth at bedtime.     Marland Kitchen lurasidone (LATUDA) 40 MG TABS Take 40 mg by mouth at bedtime.     . pioglitazone (ACTOS) 15 MG tablet TAKE 1 TABLET(15 MG) BY MOUTH DAILY 30 tablet 3  . QUEtiapine (SEROQUEL) 100 MG tablet Take 200 mg by mouth at bedtime.     . TRULICITY 1.5 0000000 SOPN INJECT 1 SYRINGE UNDER THE SKIN WEEKLY 2 mL 0  . diphenhydrAMINE (BENADRYL) 25 MG tablet Take 50 mg by mouth every 6 (six) hours as needed for allergies.     No current facility-administered medications on file prior to visit.     No Known Allergies  Blood pressure (!) 162/60, pulse 81, weight (!) 362 lb 12 oz (164.5 kg).   Assessment/Plan: Hypertension: BP remains elevated and unchanged from previous visit. Will further increase metoprolol succinate to 100mg  daily. He prefers to use his current supply, which we will allow him to do. Encouraged slow initiation of small amount of exercise with plan to increase if able. Monitor BP at work and bring log to follow up. Follow up in hypertension clinic in 3-4 weeks for further medication titration or initiation of diuretic if appropriate. Patient will call for appointment.    Thank you, Lelan Pons. Patterson Hammersmith, [facility]  10/07/2016 10:05 AM

## 2016-10-07 NOTE — Telephone Encounter (Signed)
-----   Message from Sueanne Margarita, MD sent at 10/06/2016 12:54 PM EST ----- Good AHI and compliance.  Continue current CPAP settings.

## 2016-10-07 NOTE — Telephone Encounter (Signed)
Called the patient and gave him his results, he verbalized understanding and agreed 

## 2016-10-11 ENCOUNTER — Other Ambulatory Visit: Payer: Self-pay | Admitting: Endocrinology

## 2016-10-17 ENCOUNTER — Other Ambulatory Visit: Payer: Self-pay | Admitting: Endocrinology

## 2016-10-18 ENCOUNTER — Other Ambulatory Visit: Payer: 59

## 2016-10-19 ENCOUNTER — Other Ambulatory Visit (INDEPENDENT_AMBULATORY_CARE_PROVIDER_SITE_OTHER): Payer: 59

## 2016-10-19 DIAGNOSIS — E782 Mixed hyperlipidemia: Secondary | ICD-10-CM | POA: Diagnosis not present

## 2016-10-19 DIAGNOSIS — E1165 Type 2 diabetes mellitus with hyperglycemia: Secondary | ICD-10-CM

## 2016-10-19 LAB — COMPREHENSIVE METABOLIC PANEL
ALBUMIN: 4.2 g/dL (ref 3.5–5.2)
ALK PHOS: 38 U/L — AB (ref 39–117)
ALT: 31 U/L (ref 0–53)
AST: 27 U/L (ref 0–37)
BILIRUBIN TOTAL: 0.3 mg/dL (ref 0.2–1.2)
BUN: 11 mg/dL (ref 6–23)
CO2: 24 mEq/L (ref 19–32)
CREATININE: 1.69 mg/dL — AB (ref 0.40–1.50)
Calcium: 11.1 mg/dL — ABNORMAL HIGH (ref 8.4–10.5)
Chloride: 114 mEq/L — ABNORMAL HIGH (ref 96–112)
GFR: 44.69 mL/min — ABNORMAL LOW (ref >60.00)
GLUCOSE: 130 mg/dL — AB (ref 70–99)
POTASSIUM: 4.2 meq/L (ref 3.5–5.1)
SODIUM: 141 meq/L (ref 135–145)
TOTAL PROTEIN: 6.8 g/dL (ref 6.0–8.3)

## 2016-10-19 LAB — LIPID PANEL
CHOLESTEROL: 130 mg/dL (ref 0–200)
HDL: 37.1 mg/dL — ABNORMAL LOW (ref >39.00)
LDL Cholesterol: 56 mg/dL (ref 0–99)
NONHDL: 92.55
Total CHOL/HDL Ratio: 3
Triglycerides: 183 mg/dL — ABNORMAL HIGH (ref 0.0–149.0)
VLDL: 36.6 mg/dL (ref 0.0–40.0)

## 2016-10-19 LAB — HEMOGLOBIN A1C: HEMOGLOBIN A1C: 5.7 % (ref 4.6–6.5)

## 2016-10-21 ENCOUNTER — Ambulatory Visit: Payer: 59 | Admitting: Endocrinology

## 2016-10-22 ENCOUNTER — Ambulatory Visit: Payer: 59 | Admitting: Endocrinology

## 2016-10-25 ENCOUNTER — Encounter: Payer: Self-pay | Admitting: Endocrinology

## 2016-10-25 ENCOUNTER — Ambulatory Visit (INDEPENDENT_AMBULATORY_CARE_PROVIDER_SITE_OTHER): Payer: 59 | Admitting: Endocrinology

## 2016-10-25 VITALS — BP 148/80 | HR 83 | Ht 70.0 in | Wt 364.0 lb

## 2016-10-25 DIAGNOSIS — N289 Disorder of kidney and ureter, unspecified: Secondary | ICD-10-CM

## 2016-10-25 DIAGNOSIS — E1165 Type 2 diabetes mellitus with hyperglycemia: Secondary | ICD-10-CM | POA: Diagnosis not present

## 2016-10-25 DIAGNOSIS — E782 Mixed hyperlipidemia: Secondary | ICD-10-CM

## 2016-10-25 DIAGNOSIS — I10 Essential (primary) hypertension: Secondary | ICD-10-CM | POA: Diagnosis not present

## 2016-10-25 NOTE — Patient Instructions (Addendum)
Reduce Invokamet to 1 daily  Change Glimeperide to 1mg  twice daily

## 2016-10-25 NOTE — Progress Notes (Signed)
Patient ID: CAMILLUS KALL, male   DOB: 1961-08-06, 56 y.o.   MRN: [id number]           Reason for Appointment: Follow-up for Type 2 Diabetes  Referring physician: Moreen Fowler  History of Present Illness:          Date of diagnosis of type 2 diabetes mellitus:   2010       Background history:   At the time of diagnosis he was treated with metformin which she thinks she took for about 3 years This was stopped when he had his gastric sleeve procedure in 2013; however he lost only about 40-50 pounds with this; his blood sugars did come back to normal After a few years his blood sugars started increasing and he was started on Amaryl His A1c was ranging from 7-7.4 in 2015 and 2016 although from outside lab was 6.5 in 11/15 A1c was 9.9% on his initial endocrinology consultation  Recent history:     Since his initial consultation he has been on a multidrug regimen  Non-insulin hypoglycemic drugs are:  Amaryl 2 mg qd , Trulicity 1.5 mg daily, Invokamet XR 50/1000, 2 tablets daily, Actos 15 mg daily   A1c is now near normal at 5.7  Current management, blood sugar patterns and problems identified:  On his last visit he was told to take his Amaryl half tablet twice a day as blood sugars were  lower in the afternoon with the full milligram morning dose  Blood sugars recently have been overall fairly good although checking mostly in the mornings  MORNING blood sugars range from 64-202  Has only 2 readings later in the day of 93 and 146, not postprandially  AVERAGE blood sugar at home 104  He says he is too tired to walk  He says he is not always able to watch his diet because of his mother doing the cooking and refuses to consider consultation with dietitian again   Has gained weight gain   Side effects from medications have been: None  Compliance with the medical regimen: Fair  Glucose monitoring:  done 1  times a day         Glucometer: Contour Blood Glucose readings from download  as above    Self-care: The diet that the patient has been following is: Variable He tries to avoid drinks with sugar but not consistently.  Usually eating at home in the evenings  Supper usually at 6  pm,  Dietician visit, most recent: 3/17               Exercise: walking very little  Weight history:   Wt Readings from Last 3 Encounters:  10/25/16 (!) 364 lb (165.1 kg)  10/07/16 (!) 362 lb 12 oz (164.5 kg)  10/01/16 (!) 362 lb (164.2 kg)    Glycemic control:   Lab Results  Component Value Date   HGBA1C 5.7 10/19/2016   HGBA1C 5.5 07/19/2016   HGBA1C 5.5 04/16/2016   Lab Results  Component Value Date   MICROALBUR 18.9 (H) 12/29/2015   LDLCALC 56 10/19/2016   CREATININE 1.69 (H) 10/19/2016     Lab on 10/19/2016  Component Date Value Ref Range Status  . Hgb A1c MFr Bld 10/19/2016 5.7  4.6 - 6.5 % Final  . Sodium 10/19/2016 141  135 - 145 mEq/L Final  . Potassium 10/19/2016 4.2  3.5 - 5.1 mEq/L Final  . Chloride 10/19/2016 114* 96 - 112 mEq/L Final  . CO2 10/19/2016 24  19 - 32 mEq/L Final  . Glucose, Bld 10/19/2016 130* 70 - 99 mg/dL Final  . BUN 10/19/2016 11  6 - 23 mg/dL Final  . Creatinine, Ser 10/19/2016 1.69* 0.40 - 1.50 mg/dL Final  . Total Bilirubin 10/19/2016 0.3  0.2 - 1.2 mg/dL Final  . Alkaline Phosphatase 10/19/2016 38* 39 - 117 U/L Final  . AST 10/19/2016 27  0 - 37 U/L Final  . ALT 10/19/2016 31  0 - 53 U/L Final  . Total Protein 10/19/2016 6.8  6.0 - 8.3 g/dL Final  . Albumin 10/19/2016 4.2  3.5 - 5.2 g/dL Final  . Calcium 10/19/2016 11.1* 8.4 - 10.5 mg/dL Final  . GFR 10/19/2016 44.69* >60.00 mL/min Final  . Cholesterol 10/19/2016 130  0 - 200 mg/dL Final  . Triglycerides 10/19/2016 183.0* 0.0 - 149.0 mg/dL Final  . HDL 10/19/2016 37.10* >39.00 mg/dL Final  . VLDL 10/19/2016 36.6  0.0 - 40.0 mg/dL Final  . LDL Cholesterol 10/19/2016 56  0 - 99 mg/dL Final  . Total CHOL/HDL Ratio 10/19/2016 3   Final  . NonHDL 10/19/2016 92.55   Final      OTHER active problems: See review of systems    Allergies as of 10/25/2016   No Known Allergies     Medication List       Accurate as of 10/25/16 11:59 PM. Always use your most recent med list.          acetaminophen 500 MG tablet Commonly known as:  TYLENOL Take 1,000 mg by mouth every 6 (six) hours as needed for mild pain.   b complex vitamins tablet Take 1 tablet by mouth daily.   BAYER MICROLET LANCETS lancets Use as instructed to check blood sugar 2 times per day dx code E11.9   cholecalciferol 1000 units tablet Commonly known as:  VITAMIN D Take 1,000 Units by mouth daily.   citalopram 40 MG tablet Commonly known as:  CELEXA Take 40 mg by mouth daily.   clonazePAM 1 MG tablet Commonly known as:  KLONOPIN Take 3 mg by mouth at bedtime.   diphenhydrAMINE 25 MG tablet Commonly known as:  BENADRYL Take 50 mg by mouth every 6 (six) hours as needed for allergies.   fenofibrate 145 MG tablet Commonly known as:  TRICOR TAKE 1 TABLET(145 MG) BY MOUTH DAILY   glimepiride 4 MG tablet Commonly known as:  AMARYL Take 2 tablets (8 mg total) by mouth daily with breakfast.   glucose blood test strip Commonly known as:  BAYER CONTOUR NEXT TEST USE AS DIRECTED TO TEST BLOOD SUGAR TWICE DAILY   INVOKAMET XR 50-1000 MG Tb24 Generic drug:  Canagliflozin-Metformin HCl ER TAKE 1 TABLET BY MOUTH TWICE DAILY   INVOKAMET XR 50-1000 MG Tb24 Generic drug:  Canagliflozin-Metformin HCl ER TAKE 1 TABLET BY MOUTH TWICE DAILY   lamoTRIgine 200 MG tablet Commonly known as:  LAMICTAL Take 200 mg by mouth at bedtime.   LATUDA 40 MG Tabs tablet Generic drug:  lurasidone Take 40 mg by mouth at bedtime.   lithium carbonate 450 MG CR tablet Commonly known as:  ESKALITH Take 1,800 mg by mouth at bedtime.   metoprolol succinate 100 MG 24 hr tablet Commonly known as:  TOPROL-XL Take 1 tablet (100 mg total) by mouth daily.   pioglitazone 15 MG tablet Commonly known as:   ACTOS TAKE 1 TABLET(15 MG) BY MOUTH DAILY   QUEtiapine 100 MG tablet Commonly known as:  SEROQUEL Take 200 mg by  mouth at bedtime.   TRULICITY 1.5 0000000 Sopn Generic drug:  Dulaglutide INJECT 1 SYRINGE UNDER THE SKIN WEEKLY       Allergies: No Known Allergies  Past Medical History:  Diagnosis Date  . Anxiety   . Arthritis   . Benign essential HTN 10/01/2016  . Bipolar 1 disorder ([place])    "well controlled on Lithium"  . Bruises easily   . Contact lens/glasses fitting   . Depression   . Diabetes mellitus   . Fatigue   . Generalized headaches   . Hearing loss   . Hyperlipidemia   . Hypertension    boarderline - elevated  . Kidney stone   . Obesity   . OSA on CPAP   . Poor circulation   . [place]state cancer ([place]) 02/14/14   Gleason 4+3=7, volume 56 mL  . S/P radiation therapy 07/03/2014 through 08/28/2014                                                       Prostate 7800 cGy in 40 sessions, seminal vesicles 5600 cGy in 40 sessions                          Past Surgical History:  Procedure Laterality Date  . LAPAROSCOPIC GASTRIC BANDING  09/09/09  . PROSTATE BIOPSY  02/14/14   Gleason 7, volume 56 mL    Family History  Problem Relation Age of Onset  . Arthritis Mother   . Alcohol abuse Mother   . Arthritis Father     Social History:  reports that he has never smoked. He has never used smokeless tobacco. He reports that he does not drink alcohol or use drugs.    Review of Systems   HYPERCALCEMIA: Apparently this has been present since 2014 when his PTH level was 37 His level has been variable and now slightly better at 11, previously was up to 12.1. He is not having any Recent history of kidney stones or known osteopenia  Lab Results  Component Value Date   PTH 62 07/19/2016   CALCIUM 11.1 (H) 10/19/2016   CAION 1.30 09/09/2009   PHOS 2.5 07/19/2016    Hepatic steatohepatitis He was told to have early cirrhosis at [facility] but no treatment  Given  Lab Results  Component Value Date   ALT 31 10/19/2016     Lipid history: LDL was 55 in 12/16, was on Lipitor then, this was stopped because of abnormal liver functions Last LDL 84 done by PCP     Lab Results  Component Value Date   CHOL 130 10/19/2016   HDL 37.10 (L) 10/19/2016   LDLCALC 56 10/19/2016   TRIG 183.0 (H) 10/19/2016   CHOLHDL 3 10/19/2016            Most recent eye exam was 2016, reportedly normal  Most recent foot exam: [id number]  Diabetic complications: Appears to have peripheral neuropathy.  Has had increased urine microalbumin.  No reported retinopathy.     Review of Systems    Physical Examination:  BP (!) 148/80   Pulse 83   Ht 5\' 10"  (1.778 m)   Wt (!) 364 lb (165.1 kg)   SpO2 96%   BMI 52.23 kg/m   ASSESSMENT:  Diabetes type 2, uncontrolled with  BMI  51 See history of present illness for detailed discussion of current diabetes management, blood sugar patterns and problems identified With better compliance with his diet and starting Actos his blood sugars appear to be significantly better He has not lost any weight but he thinks he is cutting back on carbohydrates in his diet Has seen the dietitian before He can do better with exercise  His A1c is excellent at 5.5 and does not appear to have any consistent high readings Morning readings are as low as 71 without hypoglycemia at any time   HYPERCALCEMIA: Likely to be from hyperparathyroidism, stable and calcium slightly better Continue observation alone  FATTY liver/early cirrhosis: He has slightly better AST and ALT and may be benefiting from Actos, continue Also needs weight loss   PLAN:     Continue current medication regimen but reduce Amaryl to half tablet in the evening  More readings after breakfast and lunch as well as supper, less in the morning  Repeat A1c in 3 months   Patient Instructions  Reduce Invokamet to 1 daily  Change Glimeperide to 1mg  twice  daily       Ura Hausen 10/26/2016, 2:28 PM   Note: This office note was prepared with Dragon voice recognition system technology. Any transcriptional errors that result from this process are unintentional. Patient ID: Riccardo Dubin, male   DOB: [birth year], 56 y.o.   MRN: [id number]           Reason for Appointment: Follow-up for Type 2 Diabetes  Referring physician: Moreen Fowler  History of Present Illness:          Date of diagnosis of type 2 diabetes mellitus:   2010       Background history:   At the time of diagnosis he was treated with metformin which she thinks she took for about 3 years This was stopped when he had his gastric sleeve procedure in 2013; however he lost only about 40-50 pounds with this; his blood sugars did come back to normal After a few years his blood sugars started increasing and he was started on Amaryl His A1c was ranging from 7-7.4 in 2015 and 2016 although from outside lab was 6.5 in 11/15  Recent history:     A1c was 9.9% on his first consultation In addition to Amaryl he was started on Trulicity and Invokamet and in 6/17 Actos 15 mg was added On his last visit his A1c was still relatively high at 7.8 and now it is down to 5.5  Current management, blood sugar patterns and problems identified:    His blood sugars are mostly near normal in the morning fasting  He has done a few readings after supper and these are fairly good except for once it was 199  He has only a couple of readings after his evening meal which are not consistently high  His lab glucose was 133 in the afternoon  He says he is cutting back on carbohydrates significantly but not always on fat intake  Weight has not come down yet  He was told to reduce Amaryl to half tablet in the morning if he had lower readings in the afternoon but he has not had any symptoms and continues to take 4 mg twice a day  He is trying to walk when he can at work but this is not fast  Non-insulin  hypoglycemic drugs are:  Amaryl 4 mg bid , Trulicity 1.5 mg daily, Invokamet XR 50/1000, 2 tablets daily  Side effects from medications have been: None  Compliance with the medical regimen: Fair  Glucose monitoring:  done 1  times a day         Glucometer: Contour Blood Glucose readings from download  Mean values apply above for all meters except median for One Touch  PRE-MEAL Fasting Lunch Dinner Bedtime Overall  Glucose range: 71-164    94-199    Mean/median: 119    138  124     Self-care: The diet that the patient has been following is: Recently low carbohydrate He tries to avoid drinks with sugar but not consistently.  Usually eating at home in the evenings  Supper usually at 6  pm,  Dietician visit, most recent: 3/17               Exercise: walking 15 min 0-2/7  Weight history:   Wt Readings from Last 3 Encounters:  10/25/16 (!) 364 lb (165.1 kg)  10/07/16 (!) 362 lb 12 oz (164.5 kg)  10/01/16 (!) 362 lb (164.2 kg)    Glycemic control:   Lab Results  Component Value Date   HGBA1C 5.7 10/19/2016   HGBA1C 5.5 07/19/2016   HGBA1C 5.5 04/16/2016   Lab Results  Component Value Date   MICROALBUR 18.9 (H) 12/29/2015   LDLCALC 56 10/19/2016   CREATININE 1.69 (H) 10/19/2016    Lab on 10/19/2016  Component Date Value Ref Range Status  . Hgb A1c MFr Bld 10/19/2016 5.7  4.6 - 6.5 % Final  . Sodium 10/19/2016 141  135 - 145 mEq/L Final  . Potassium 10/19/2016 4.2  3.5 - 5.1 mEq/L Final  . Chloride 10/19/2016 114* 96 - 112 mEq/L Final  . CO2 10/19/2016 24  19 - 32 mEq/L Final  . Glucose, Bld 10/19/2016 130* 70 - 99 mg/dL Final  . BUN 10/19/2016 11  6 - 23 mg/dL Final  . Creatinine, Ser 10/19/2016 1.69* 0.40 - 1.50 mg/dL Final  . Total Bilirubin 10/19/2016 0.3  0.2 - 1.2 mg/dL Final  . Alkaline Phosphatase 10/19/2016 38* 39 - 117 U/L Final  . AST 10/19/2016 27  0 - 37 U/L Final  . ALT 10/19/2016 31  0 - 53 U/L Final  . Total Protein 10/19/2016 6.8  6.0 - 8.3 g/dL  Final  . Albumin 10/19/2016 4.2  3.5 - 5.2 g/dL Final  . Calcium 10/19/2016 11.1* 8.4 - 10.5 mg/dL Final  . GFR 10/19/2016 44.69* >60.00 mL/min Final  . Cholesterol 10/19/2016 130  0 - 200 mg/dL Final  . Triglycerides 10/19/2016 183.0* 0.0 - 149.0 mg/dL Final  . HDL 10/19/2016 37.10* >39.00 mg/dL Final  . VLDL 10/19/2016 36.6  0.0 - 40.0 mg/dL Final  . LDL Cholesterol 10/19/2016 56  0 - 99 mg/dL Final  . Total CHOL/HDL Ratio 10/19/2016 3   Final  . NonHDL 10/19/2016 92.55   Final     OTHER active problems: See review of systems    Allergies as of 10/25/2016   No Known Allergies     Medication List       Accurate as of 10/25/16 11:59 PM. Always use your most recent med list.          acetaminophen 500 MG tablet Commonly known as:  TYLENOL Take 1,000 mg by mouth every 6 (six) hours as needed for mild pain.   b complex vitamins tablet Take 1 tablet by mouth daily.   BAYER MICROLET LANCETS lancets Use as instructed to check blood sugar 2 times per  day dx code E11.9   cholecalciferol 1000 units tablet Commonly known as:  VITAMIN D Take 1,000 Units by mouth daily.   citalopram 40 MG tablet Commonly known as:  CELEXA Take 40 mg by mouth daily.   clonazePAM 1 MG tablet Commonly known as:  KLONOPIN Take 3 mg by mouth at bedtime.   diphenhydrAMINE 25 MG tablet Commonly known as:  BENADRYL Take 50 mg by mouth every 6 (six) hours as needed for allergies.   fenofibrate 145 MG tablet Commonly known as:  TRICOR TAKE 1 TABLET(145 MG) BY MOUTH DAILY   glimepiride 4 MG tablet Commonly known as:  AMARYL Take 2 tablets (8 mg total) by mouth daily with breakfast.   glucose blood test strip Commonly known as:  BAYER CONTOUR NEXT TEST USE AS DIRECTED TO TEST BLOOD SUGAR TWICE DAILY   INVOKAMET XR 50-1000 MG Tb24 Generic drug:  Canagliflozin-Metformin HCl ER TAKE 1 TABLET BY MOUTH TWICE DAILY   INVOKAMET XR 50-1000 MG Tb24 Generic drug:  Canagliflozin-Metformin HCl  ER TAKE 1 TABLET BY MOUTH TWICE DAILY   lamoTRIgine 200 MG tablet Commonly known as:  LAMICTAL Take 200 mg by mouth at bedtime.   LATUDA 40 MG Tabs tablet Generic drug:  lurasidone Take 40 mg by mouth at bedtime.   lithium carbonate 450 MG CR tablet Commonly known as:  ESKALITH Take 1,800 mg by mouth at bedtime.   metoprolol succinate 100 MG 24 hr tablet Commonly known as:  TOPROL-XL Take 1 tablet (100 mg total) by mouth daily.   pioglitazone 15 MG tablet Commonly known as:  ACTOS TAKE 1 TABLET(15 MG) BY MOUTH DAILY   QUEtiapine 100 MG tablet Commonly known as:  SEROQUEL Take 200 mg by mouth at bedtime.   TRULICITY 1.5 0000000 Sopn Generic drug:  Dulaglutide INJECT 1 SYRINGE UNDER THE SKIN WEEKLY       Allergies: No Known Allergies  Past Medical History:  Diagnosis Date  . Anxiety   . Arthritis   . Benign essential HTN 10/01/2016  . Bipolar 1 disorder ([place])    "well controlled on Lithium"  . Bruises easily   . Contact lens/glasses fitting   . Depression   . Diabetes mellitus   . Fatigue   . Generalized headaches   . Hearing loss   . Hyperlipidemia   . Hypertension    boarderline - elevated  . Kidney stone   . Obesity   . OSA on CPAP   . Poor circulation   . Prostate cancer ([place]) 02/14/14   Gleason 4+3=7, volume 56 mL  . S/P radiation therapy 07/03/2014 through 08/28/2014                                                       Prostate 7800 cGy in 40 sessions, seminal vesicles 5600 cGy in 40 sessions                          Past Surgical History:  Procedure Laterality Date  . LAPAROSCOPIC GASTRIC BANDING  09/09/09  . PROSTATE BIOPSY  02/14/14   Gleason 7, volume 56 mL    Family History  Problem Relation Age of Onset  . Arthritis Mother   . Alcohol abuse Mother   . Arthritis Father     Social History:  reports that he has never smoked. He has never used smokeless tobacco. He reports that he does not drink alcohol or use drugs.    Review of  Systems   HYPERCALCEMIA:  this has been present since 2014 when his PTH level was 37 His level has been variable  Highest reading has been 12.1, More recently it has been below 11.5 No history of kidney stones   Last PTH  is relatively higher but still upper normal  Lab Results  Component Value Date   PTH 62 07/19/2016   CALCIUM 11.1 (H) 10/19/2016   CAION 1.30 09/09/2009   PHOS 2.5 07/19/2016    Hepatic steatohepatitis He was told to have early cirrhosis at [facility] but no treatment Recommended  Lab Results  Component Value Date   ALT 31 10/19/2016     Lipid history:   Because of his triglycerides were over 300 he was started on fenofibrate and now they are improved LDL excellent over    Lab Results  Component Value Date   CHOL 130 10/19/2016   HDL 37.10 (L) 10/19/2016   LDLCALC 56 10/19/2016   TRIG 183.0 (H) 10/19/2016   CHOLHDL 3 10/19/2016            Most recent eye exam was 2016, reportedly normal  Most recent foot exam: [id number]  Diabetic complications: Appears to have peripheral neuropathy.  Has had increased urine microalbumin.  No reported retinopathy.      HYPERTENSION: His blood pressure has been relatively high, is on metoprolol and the dose was increased by cardiologist  RENAL dysfunction: This is new, does not take any Advil or Aleve  Lab Results  Component Value Date   CREATININE 1.69 (H) 10/19/2016   CREATININE 1.31 07/19/2016   CREATININE 1.37 04/16/2016   CREATININE 1.34 02/16/2016    Physical Examination:  BP (!) 148/80   Pulse 83   Ht 5\' 10"  (1.778 m)   Wt (!) 364 lb (165.1 kg)   SpO2 96%   BMI 52.23 kg/m   No pedal edema present  ASSESSMENT/PLAN:  Diabetes type 2, uncontrolled with BMI  51 See history of present illness for detailed discussion of current diabetes management, blood sugar patterns and problems identified  Although his blood sugars are excellent his main difficulty is being consistent with diet and not  being able to lose weight No hypoglycemia but his blood sugars are occasionally in the 60s in the morning Not checking after meals especially after dinner as directed Still not able to do much exercise because of fatigue and also does not make enough efforts to change his diet Refuses consultation with dietitian again  Plan:  Since his renal function is worse will have to reduce his Invokamet to 1 tablet daily  Will cut his Amaryl down to 1 tablet of 1 mg twice a day instead of 2 mg twice a day  More readings after meals  Exercise as much as possible  HYPERCALCEMIA: This is  from hyperparathyroidism, stable and calcium mildly increased only Continue observation alone  RENAL function abnormality: Etiology unclear and he is not on any new medication that would cause this Advised him to follow-up with his PCP for further evaluation  Also recommended that he discuss checking his lithium level with taking this chronically  FATTY liver/early cirrhosis: He has been on Actos and liver functions are normal, will review records when available from his consultant at [place]: His triglycerides are much better with fenofibrate but  since his renal function is worse will have to put this on hold for now     Patient Instructions  Reduce Invokamet to 1 daily  Change Glimeperide to 1mg  twice daily      Counseling time on subjects discussed above is over 50% of today's 25 minute visit  Tyquavious Gamel 10/26/2016, 2:28 PM   Note: This office note was prepared with Dragon voice recognition system technology. Any transcriptional errors that result from this process are unintentional.

## 2016-11-04 ENCOUNTER — Ambulatory Visit: Payer: 59

## 2016-11-05 ENCOUNTER — Telehealth: Payer: Self-pay | Admitting: Endocrinology

## 2016-11-05 NOTE — Telephone Encounter (Signed)
Pt is taking the invokamet please remove the notation.

## 2016-11-09 ENCOUNTER — Ambulatory Visit (INDEPENDENT_AMBULATORY_CARE_PROVIDER_SITE_OTHER): Payer: 59 | Admitting: Pharmacist

## 2016-11-09 VITALS — BP 132/80 | HR 58

## 2016-11-09 DIAGNOSIS — I10 Essential (primary) hypertension: Secondary | ICD-10-CM

## 2016-11-09 NOTE — Patient Instructions (Signed)
Return for a follow up appointment in 6 weeks  Check your blood pressure at home daily (if able) and keep record of the readings.  Take your BP meds as follows: Continue metoprolol succinate 100mg  daily   Call for follow up appt [phone] - Tana Coast, PharmD  Bring all of your meds, your BP cuff and your record of home blood pressures to your next appointment.  Exercise as you're able, try to walk approximately 30 minutes per day.  Keep salt intake to a minimum, especially watch canned and prepared boxed foods.  Eat more fresh fruits and vegetables and fewer canned items.  Avoid eating in fast food restaurants.    HOW TO TAKE YOUR BLOOD PRESSURE: . Rest 5 minutes before taking your blood pressure. .  Don't smoke or drink caffeinated beverages for at least 30 minutes before. . Take your blood pressure before (not after) you eat. . Sit comfortably with your back supported and both feet on the floor (don't cross your legs). . Elevate your arm to heart level on a table or a desk. . Use the proper sized cuff. It should fit smoothly and snugly around your bare upper arm. There should be enough room to slip a fingertip under the cuff. The bottom edge of the cuff should be 1 inch above the crease of the elbow. . Ideally, take 3 measurements at one sitting and record the average.

## 2016-11-09 NOTE — Progress Notes (Signed)
Patient ID: Frank Cox                 DOB: 1960-11-11                      MRN: [id number]     HPI: Frank Cox is a 56 y.o. male patient of Dr. Radford Pax with PMH below who presents today for hypertension evaluation. He is currently on metoprolol and thinks that he has been intolerant to ACE I. He cannot take an ARB due to his lithium and amlodipine is contraindicated with Latuda and Seroquel. It was recommended to start a diuretic if pressure still elevated - though we would need to avoid thiazide diuretics due to lithium as well. At his most recent OV, his metoprolol was increased to 100mg  daily.   Pt has been doing well since our last visit. He has tried to walk more at work. He feels good and believes he feels much better than before increasing metoprolol.   Cardiac Hx: HTN, OSA on CPAP, HLD, DM2, s/plapband in 2011, bipolar   Current HTN meds:  Metoprolol succinate 100mg  daily in the morning  Previously tried: none  BP goal: <130/80  Family History: He states there is no history of high blood pressure in his family and his mother and father are both still living.   Social History: He denies tobacco and states he has had only 1 beer in the last year to celebrate his birthday.   Diet: He eats most of his meals from home. He does not add salt to his food. He states he has a soda "problem" and drinks 2 liters of soda per day. Had a discussion about cutting back on this and he states he will try to start limiting.   Exercise: No exercise and works a sedentary job. We had a lengthy discussion about slowly increasing his exercise. He states he knows a course through the plant at work that he could start walking. Since he is very hesitant about exercise, but agreed that it would likely benefit his overall health. He was willing to try to walk the course at work one time once a week.   Home BP readings: Pressures have been slightly lower 152/80 - he is currently only measuring at  work.   Wt Readings from Last 3 Encounters:  10/25/16 (!) 364 lb (165.1 kg)  10/07/16 (!) 362 lb 12 oz (164.5 kg)  10/01/16 (!) 362 lb (164.2 kg)   BP Readings from Last 3 Encounters:  11/09/16 132/80  10/25/16 (!) 148/80  10/07/16 (!) 162/60   Pulse Readings from Last 3 Encounters:  11/09/16 (!) 58  10/25/16 83  10/07/16 81    Renal function: CrCl cannot be calculated (Patient's most recent lab result is older than the maximum 21 days allowed.).  Past Medical History:  Diagnosis Date  . Anxiety   . Arthritis   . Benign essential HTN 10/01/2016  . Bipolar 1 disorder ([place])    "well controlled on Lithium"  . Bruises easily   . Contact lens/glasses fitting   . Depression   . Diabetes mellitus   . Fatigue   . Generalized headaches   . Hearing loss   . Hyperlipidemia   . Hypertension    boarderline - elevated  . Kidney stone   . Obesity   . OSA on CPAP   . Poor circulation   . Prostate cancer ([place]) 02/14/14   Gleason 4+3=7, volume 56 mL  .  S/P radiation therapy 07/03/2014 through 08/28/2014                                                       Prostate 7800 cGy in 40 sessions, seminal vesicles 5600 cGy in 40 sessions                          Current Outpatient Prescriptions on File Prior to Visit  Medication Sig Dispense Refill  . acetaminophen (TYLENOL) 500 MG tablet Take 1,000 mg by mouth every 6 (six) hours as needed for mild pain.    Marland Kitchen b complex vitamins tablet Take 1 tablet by mouth daily.    Marland Kitchen BAYER MICROLET LANCETS lancets Use as instructed to check blood sugar 2 times per day dx code E11.9 100 each 2  . cholecalciferol (VITAMIN D) 1000 units tablet Take 1,000 Units by mouth daily.    . citalopram (CELEXA) 40 MG tablet Take 40 mg by mouth daily.      . clonazePAM (KLONOPIN) 1 MG tablet Take 3 mg by mouth at bedtime.     . diphenhydrAMINE (BENADRYL) 25 MG tablet Take 50 mg by mouth every 6 (six) hours as needed for allergies.    . fenofibrate (TRICOR) 145 MG  tablet TAKE 1 TABLET(145 MG) BY MOUTH DAILY 30 tablet 2  . glimepiride (AMARYL) 4 MG tablet Take 2 tablets (8 mg total) by mouth daily with breakfast. (Patient taking differently: Take 4 mg by mouth daily with breakfast. ) 60 tablet 0  . glucose blood (BAYER CONTOUR NEXT TEST) test strip USE AS DIRECTED TO TEST BLOOD SUGAR TWICE DAILY 50 each 3  . INVOKAMET XR 50-1000 MG TB24 TAKE 1 TABLET BY MOUTH TWICE DAILY 60 tablet 3  . INVOKAMET XR 50-1000 MG TB24 TAKE 1 TABLET BY MOUTH TWICE DAILY (Patient not taking: Reported on 10/25/2016) 60 tablet 0  . lamoTRIgine (LAMICTAL) 200 MG tablet Take 200 mg by mouth at bedtime.     Marland Kitchen lithium carbonate (ESKALITH) 450 MG CR tablet Take 1,800 mg by mouth at bedtime.     Marland Kitchen lurasidone (LATUDA) 40 MG TABS Take 40 mg by mouth at bedtime.     . metoprolol succinate (TOPROL-XL) 100 MG 24 hr tablet Take 1 tablet (100 mg total) by mouth daily. 30 tablet 11  . pioglitazone (ACTOS) 15 MG tablet TAKE 1 TABLET(15 MG) BY MOUTH DAILY 30 tablet 2  . QUEtiapine (SEROQUEL) 100 MG tablet Take 200 mg by mouth at bedtime.     . TRULICITY 1.5 0000000 SOPN INJECT 1 SYRINGE UNDER THE SKIN WEEKLY 2 mL 1   No current facility-administered medications on file prior to visit.     No Known Allergies  Blood pressure 132/80, pulse (!) 58, SpO2 96 %.   Assessment/Plan: Hypertension: BP borderline at goal today. He is hesitant to start or change medications. Will continue metoprolol succinate 100mg  daily. Have asked he record pressure from work and try to get a few measurements while not at work. Follow up in HTN clinic in 4-6 weeks for additional medication titration/addition if needed.   Thank you, Lelan Pons. Patterson Hammersmith, [facility]  11/09/2016 3:50 PM

## 2016-11-18 ENCOUNTER — Other Ambulatory Visit (INDEPENDENT_AMBULATORY_CARE_PROVIDER_SITE_OTHER): Payer: 59

## 2016-11-18 DIAGNOSIS — E1165 Type 2 diabetes mellitus with hyperglycemia: Secondary | ICD-10-CM | POA: Diagnosis not present

## 2016-11-18 LAB — BASIC METABOLIC PANEL
BUN: 11 mg/dL (ref 6–23)
CHLORIDE: 114 meq/L — AB (ref 96–112)
CO2: 21 mEq/L (ref 19–32)
Calcium: 11.3 mg/dL — ABNORMAL HIGH (ref 8.4–10.5)
Creatinine, Ser: 1.47 mg/dL (ref 0.40–1.50)
GFR: 52.48 mL/min — ABNORMAL LOW (ref >60.00)
Glucose, Bld: 125 mg/dL — ABNORMAL HIGH (ref 70–99)
POTASSIUM: 3.8 meq/L (ref 3.5–5.1)
SODIUM: 141 meq/L (ref 135–145)

## 2016-11-18 LAB — MICROALBUMIN / CREATININE URINE RATIO
CREATININE, U: 14.9 mg/dL
MICROALB UR: 7.7 mg/dL — AB (ref 0.0–1.9)
Microalb Creat Ratio: 51.6 mg/g — ABNORMAL HIGH (ref 0.0–30.0)

## 2016-11-18 LAB — URINALYSIS, ROUTINE W REFLEX MICROSCOPIC
Bilirubin Urine: NEGATIVE
Hgb urine dipstick: NEGATIVE
KETONES UR: NEGATIVE
Leukocytes, UA: NEGATIVE
Nitrite: NEGATIVE
PH: 6.5 (ref 5.0–8.0)
RBC / HPF: NONE SEEN (ref 0–?)
TOTAL PROTEIN, URINE-UPE24: NEGATIVE
UROBILINOGEN UA: 0.2 (ref 0.0–1.0)

## 2016-11-19 ENCOUNTER — Other Ambulatory Visit: Payer: 59

## 2016-11-22 DIAGNOSIS — E119 Type 2 diabetes mellitus without complications: Secondary | ICD-10-CM | POA: Diagnosis not present

## 2016-11-23 ENCOUNTER — Ambulatory Visit (INDEPENDENT_AMBULATORY_CARE_PROVIDER_SITE_OTHER): Payer: 59 | Admitting: Endocrinology

## 2016-11-23 ENCOUNTER — Encounter: Payer: Self-pay | Admitting: Endocrinology

## 2016-11-23 VITALS — BP 146/68 | HR 68 | Ht 70.0 in | Wt 368.0 lb

## 2016-11-23 DIAGNOSIS — E1165 Type 2 diabetes mellitus with hyperglycemia: Secondary | ICD-10-CM

## 2016-11-23 NOTE — Progress Notes (Signed)
Patient ID: Frank Cox, male   DOB: November 24, 1960, 56 y.o.   MRN: [id number]           Reason for Appointment: Follow-up for Type 2 Diabetes  Referring physician: Moreen Fowler  History of Present Illness:          Date of diagnosis of type 2 diabetes mellitus:   2010       Background history:   At the time of diagnosis he was treated with metformin which she thinks she took for about 3 years This was stopped when he had his gastric sleeve procedure in 2013; however he lost only about 40-50 pounds with this; his blood sugars did come back to normal After a few years his blood sugars started increasing and he was started on Amaryl His A1c was ranging from 7-7.4 in 2015 and 2016 although from outside lab was 6.5 in 11/15 A1c was 9.9% on his initial endocrinology consultation  Recent history:     Since his initial consultation he has been on a multidrug regimen  Non-insulin hypoglycemic drugs are:  Amaryl 2 mg qd , Trulicity 1.5 mg daily, Invokamet XR 50/1000, 1 tablets daily, Actos 15 mg daily   A1c previously was excellent at 5.7 He is now here for short-term follow-up after reducing his medications and last visit  Current management, blood sugar patterns and problems identified:  On his last visit he was told to take his Amaryl half tablet once a day along with taking  Invokamet once a day, this was because of the renal dysfunction.  He did not follow-up with his PCP for the renal dysfunction, creatinine clearance is improved  He is again checking blood sugars only in the morning and forgetting to do it later in the day  He still has not tried to do any walking for exercise in the week and do somewhat work  Blood sugars are fairly good overall except higher on the weekend when he was celebrating his birthday   Has gained weight gain   Side effects from medications have been: None  Compliance with the medical regimen: Fair  Glucose monitoring:  done 1  times a day          Glucometer: Contour Blood Glucose readings from download as follows  Fasting range  90-1 91; average 119   Self-care: The diet that the patient has been following is: Variable He tries to avoid drinks with sugar but not consistently.  Usually eating at home in the evenings  Supper usually at 6  pm,  Dietician visit, most recent: 3/17               Exercise: walking very little  Weight history:   Wt Readings from Last 3 Encounters:  11/23/16 (!) 368 lb (166.9 kg)  10/25/16 (!) 364 lb (165.1 kg)  10/07/16 (!) 362 lb 12 oz (164.5 kg)    Glycemic control:   Lab Results  Component Value Date   HGBA1C 5.7 10/19/2016   HGBA1C 5.5 07/19/2016   HGBA1C 5.5 04/16/2016   Lab Results  Component Value Date   MICROALBUR 7.7 (H) 11/18/2016   LDLCALC 56 10/19/2016   CREATININE 1.47 11/18/2016    Other active problems: See review of systems   Lab on 11/18/2016  Component Date Value Ref Range Status  . Sodium 11/18/2016 141  135 - 145 mEq/L Final  . Potassium 11/18/2016 3.8  3.5 - 5.1 mEq/L Final  . Chloride 11/18/2016 114* 96 - 112 mEq/L  Final  . CO2 11/18/2016 21  19 - 32 mEq/L Final  . Glucose, Bld 11/18/2016 125* 70 - 99 mg/dL Final  . BUN 11/18/2016 11  6 - 23 mg/dL Final  . Creatinine, Ser 11/18/2016 1.47  0.40 - 1.50 mg/dL Final  . Calcium 11/18/2016 11.3* 8.4 - 10.5 mg/dL Final  . GFR 11/18/2016 52.48* >60.00 mL/min Final  . Microalb, Ur 11/18/2016 7.7* 0.0 - 1.9 mg/dL Final  . Creatinine,U 11/18/2016 14.9  mg/dL Final  . Microalb Creat Ratio 11/18/2016 51.6* 0.0 - 30.0 mg/g Final  . Color, Urine 11/18/2016 YELLOW  Yellow;Lt. Yellow Final  . APPearance 11/18/2016 CLEAR  Clear Final  . Specific Gravity, Urine 11/18/2016 <=1.005* 1.000 - 1.030 Final  . pH 11/18/2016 6.5  5.0 - 8.0 Final  . Total Protein, Urine 11/18/2016 NEGATIVE  Negative Final  . Urine Glucose 11/18/2016 >=1000* Negative Final  . Ketones, ur 11/18/2016 NEGATIVE  Negative Final  . Bilirubin Urine  11/18/2016 NEGATIVE  Negative Final  . Hgb urine dipstick 11/18/2016 NEGATIVE  Negative Final  . Urobilinogen, UA 11/18/2016 0.2  0.0 - 1.0 Final  . Leukocytes, UA 11/18/2016 NEGATIVE  Negative Final  . Nitrite 11/18/2016 NEGATIVE  Negative Final  . WBC, UA 11/18/2016 0-2/hpf  0-2/hpf Final  . RBC / HPF 11/18/2016 none seen  0-2/hpf Final  . Squamous Epithelial / LPF 11/18/2016 Rare(0-4/hpf)  Rare(0-4/hpf) Final     OTHER active problems: See review of systems    Allergies as of 11/23/2016   No Known Allergies     Medication List       Accurate as of 11/23/16  1:47 PM. Always use your most recent med list.          acetaminophen 500 MG tablet Commonly known as:  TYLENOL Take 1,000 mg by mouth every 6 (six) hours as needed for mild pain.   b complex vitamins tablet Take 1 tablet by mouth daily.   BAYER MICROLET LANCETS lancets Use as instructed to check blood sugar 2 times per day dx code E11.9   cholecalciferol 1000 units tablet Commonly known as:  VITAMIN D Take 1,000 Units by mouth daily.   citalopram 40 MG tablet Commonly known as:  CELEXA Take 40 mg by mouth daily.   clonazePAM 1 MG tablet Commonly known as:  KLONOPIN Take 3 mg by mouth at bedtime.   diphenhydrAMINE 25 MG tablet Commonly known as:  BENADRYL Take 50 mg by mouth every 6 (six) hours as needed for allergies.   fenofibrate 145 MG tablet Commonly known as:  TRICOR TAKE 1 TABLET(145 MG) BY MOUTH DAILY   glimepiride 4 MG tablet Commonly known as:  AMARYL Take 2 tablets (8 mg total) by mouth daily with breakfast.   glucose blood test strip Commonly known as:  BAYER CONTOUR NEXT TEST USE AS DIRECTED TO TEST BLOOD SUGAR TWICE DAILY   INVOKAMET XR 50-1000 MG Tb24 Generic drug:  Canagliflozin-Metformin HCl ER TAKE 1 TABLET BY MOUTH TWICE DAILY   lamoTRIgine 200 MG tablet Commonly known as:  LAMICTAL Take 200 mg by mouth at bedtime.   LATUDA 40 MG Tabs tablet Generic drug:   lurasidone Take 40 mg by mouth at bedtime.   lithium carbonate 450 MG CR tablet Commonly known as:  ESKALITH Take 1,800 mg by mouth at bedtime.   metoprolol succinate 100 MG 24 hr tablet Commonly known as:  TOPROL-XL Take 1 tablet (100 mg total) by mouth daily.   pioglitazone 15 MG tablet Commonly  known as:  ACTOS TAKE 1 TABLET(15 MG) BY MOUTH DAILY   QUEtiapine 100 MG tablet Commonly known as:  SEROQUEL Take 200 mg by mouth at bedtime.   TRULICITY 1.5 GL/8.7FI Sopn Generic drug:  Dulaglutide INJECT 1 SYRINGE UNDER THE SKIN WEEKLY       Allergies: No Known Allergies  Past Medical History:  Diagnosis Date  . Anxiety   . Arthritis   . Benign essential HTN 10/01/2016  . Bipolar 1 disorder ([place])    "well controlled on Lithium"  . Bruises easily   . Contact lens/glasses fitting   . Depression   . Diabetes mellitus   . Fatigue   . Generalized headaches   . Hearing loss   . Hyperlipidemia   . Hypertension    boarderline - elevated  . Kidney stone   . Obesity   . OSA on CPAP   . Poor circulation   . Prostate cancer ([place]) 02/14/14   Gleason 4+3=7, volume 56 mL  . S/P radiation therapy 07/03/2014 through 08/28/2014                                                       Prostate 7800 cGy in 40 sessions, seminal vesicles 5600 cGy in 40 sessions                          Past Surgical History:  Procedure Laterality Date  . LAPAROSCOPIC GASTRIC BANDING  09/09/09  . PROSTATE BIOPSY  02/14/14   Gleason 7, volume 56 mL    Family History  Problem Relation Age of Onset  . Arthritis Mother   . Alcohol abuse Mother   . Arthritis Father     Social History:  reports that he has never smoked. He has never used smokeless tobacco. He reports that he does not drink alcohol or use drugs.    Review of Systems   RENAL dysfunction: His creatinine is better with stopping fenofibrate and reducing well, He is also on lithium  Lab Results  Component Value Date   CREATININE 1.47  11/18/2016   BUN 11 11/18/2016   NA 141 11/18/2016   K 3.8 11/18/2016   CL 114 (H) 11/18/2016   CO2 21 11/18/2016     HYPERCALCEMIA: Apparently this has been present since 2014 when his PTH level was 37 His level has been variable and now 11.3, previously was up to 12.1. He is not having any Recent history of kidney stones or known osteopenia  Lab Results  Component Value Date   PTH 62 07/19/2016   CALCIUM 11.3 (H) 11/18/2016   CAION 1.30 09/09/2009   PHOS 2.5 07/19/2016    Hepatic steatohepatitis He was told to have early cirrhosis at [facility] Results  Component Value Date   ALT 31 10/19/2016     Lipid history:  Recently triglycerides are below 200 He was told to stop his fenofibrate because of renal dysfunction Last LDL as follows     Lab Results  Component Value Date   CHOL 130 10/19/2016   HDL 37.10 (L) 10/19/2016   LDLCALC 56 10/19/2016   TRIG 183.0 (H) 10/19/2016   CHOLHDL 3 10/19/2016            Most recent eye exam was 2016, reportedly  normal  Most recent foot exam: 10/4816  Diabetic complications: Appears to have peripheral neuropathy.  Has had increased urine microalbumin.  No reported retinopathy.     Review of Systems    Physical Examination:  BP (!) 146/68   Pulse 68   Ht 5\' 10"  (1.778 m)   Wt (!) 368 lb (166.9 kg)   SpO2 96%   BMI 52.80 kg/m   ASSESSMENT:  Diabetes type 2, uncontrolled with BMI  51 See history of present illness for  discussion of current diabetes management, blood sugar patterns and problems identified  He is having some low normal sugars on her last visit and with reducing Amaryl this is better Also Invokamet was reduced because of renal dysfunction His fasting readings are recently averaging 119 and higher only when he goes off his diet at night  He can do better with exercise and he plans to start walking at work  HYPERCALCEMIA: Likely to be from hyperparathyroidism, asymptomatic, long-standing  and stable and calcium slightly higher now Continue observation alone  RENAL dysfunction: This is better with stopping fenofibrate and reducing Invokamet but etiology still unclear   PLAN:     Continue current medication regimen   More readings after breakfast and lunch as well as supper  Consider increasing the Invokamet or adding extra metformin if blood sugars are higher.  Recheck fasting lipids on the next visit    There are no Patient Instructions on file for this visit.  Devanny Palecek 11/23/2016, 1:47 PM   Note: This office note was prepared with Dragon voice recognition system technology. Any transcriptional errors that result from this process are unintentional.

## 2016-11-29 ENCOUNTER — Other Ambulatory Visit: Payer: Self-pay | Admitting: Endocrinology

## 2016-12-15 ENCOUNTER — Other Ambulatory Visit: Payer: Self-pay | Admitting: Endocrinology

## 2016-12-16 ENCOUNTER — Ambulatory Visit (INDEPENDENT_AMBULATORY_CARE_PROVIDER_SITE_OTHER): Payer: 59 | Admitting: Pharmacist

## 2016-12-16 VITALS — BP 142/60 | HR 72

## 2016-12-16 DIAGNOSIS — I10 Essential (primary) hypertension: Secondary | ICD-10-CM

## 2016-12-16 MED ORDER — METOPROLOL SUCCINATE ER 200 MG PO TB24: 200.0000 mg | ORAL_TABLET | Freq: Every day | ORAL | 1 refills | Status: DC

## 2016-12-16 NOTE — Patient Instructions (Addendum)
Return for a follow up appointment in 4 weeks  Check your blood pressure at home daily (if able) and keep record of the readings.  Take your BP meds as follows: INCREASE metoprolol to 200mg  daily (2 tablets daily until you complete your current supply)   Bring all of your meds, your BP cuff and your record of home blood pressures to your next appointment.  Exercise as you're able, try to walk approximately 30 minutes per day.  Keep salt intake to a minimum, especially watch canned and prepared boxed foods.  Eat more fresh fruits and vegetables and fewer canned items.  Avoid eating in fast food restaurants.    HOW TO TAKE YOUR BLOOD PRESSURE: . Rest 5 minutes before taking your blood pressure. .  Don't smoke or drink caffeinated beverages for at least 30 minutes before. . Take your blood pressure before (not after) you eat. . Sit comfortably with your back supported and both feet on the floor (don't cross your legs). . Elevate your arm to heart level on a table or a desk. . Use the proper sized cuff. It should fit smoothly and snugly around your bare upper arm. There should be enough room to slip a fingertip under the cuff. The bottom edge of the cuff should be 1 inch above the crease of the elbow. . Ideally, take 3 measurements at one sitting and record the average.

## 2016-12-16 NOTE — Progress Notes (Signed)
Patient ID: JOH RAO                 DOB: Dec 02, 1960                      MRN: [id number]     HPI: Frank Cox is a 56 y.o. male patient of Dr. Radford Pax with PMH below who presents today for hypertension evaluation. He is currently on metoprolol and thinks that he has been intolerant to ACE I. He cannot take an ARB due to his lithium and amlodipine is contraindicated with Latuda and Seroquel. It was recommended to start a diuretic if pressure still elevated - though we would need to avoid thiazide diuretics due to lithium as well. At his most recent OV, his blood pressure was borderline controlled and no medication changes were made.   Patient has lost about 8 lb since our last visit. He states that some policy changes at work have lead to his weight loss, but he has been feeling better and started to notice he is wanting to make healthier choices. He states he now eats mostly weight watchers meals for lunch and has started choosing yogurt instead of chips/candy. He now walks 3 flights of stairs at work several times a day as well.   He is very excited about his lifestyle changes.   Cardiac Hx: HTN, OSA on CPAP, HLD, DM2, s/plapband in 2011, bipolar   Current HTN meds:  Metoprolol succinate 100mg  daily in the morning  Previously tried: none  BP goal: <130/80  Family History: He states there is no history of high blood pressure in his family and his mother and father are both still living.   Social History: He denies tobacco and states he has had only 1 beer in the last year to celebrate his birthday.   Diet: He eats most of his meals from home. He does not add salt to his food. He states he has a soda "problem" and drinks 2 liters of soda per day. Had a discussion about cutting back on this and he states he will try to start limiting.   Exercise: No exercise and works a sedentary job. We had a lengthy discussion about slowly increasing his exercise. He states he knows a course  through the plant at work that he could start walking. Since he is very hesitant about exercise, but agreed that it would likely benefit his overall health. He was willing to try to walk the course at work one time once a week.   Home BP readings: Pressures have been slightly lower 152/80 - he is currently only measuring at work.   Wt Readings from Last 3 Encounters:  11/23/16 (!) 368 lb (166.9 kg)  10/25/16 (!) 364 lb (165.1 kg)  10/07/16 (!) 362 lb 12 oz (164.5 kg)   BP Readings from Last 3 Encounters:  12/16/16 (!) 144/66  11/23/16 (!) 146/68  11/09/16 132/80   Pulse Readings from Last 3 Encounters:  12/16/16 72  11/23/16 68  11/09/16 (!) 58    Renal function: CrCl cannot be calculated (Patient's most recent lab result is older than the maximum 21 days allowed.).  Past Medical History:  Diagnosis Date  . Anxiety   . Arthritis   . Benign essential HTN 10/01/2016  . Bipolar 1 disorder ([place])    "well controlled on Lithium"  . Bruises easily   . Contact lens/glasses fitting   . Depression   . Diabetes mellitus   .  Fatigue   . Generalized headaches   . Hearing loss   . Hyperlipidemia   . Hypertension    boarderline - elevated  . Kidney stone   . Obesity   . OSA on CPAP   . Poor circulation   . Prostate cancer ([place]) 02/14/14   Gleason 4+3=7, volume 56 mL  . S/P radiation therapy 07/03/2014 through 08/28/2014                                                       Prostate 7800 cGy in 40 sessions, seminal vesicles 5600 cGy in 40 sessions                          Current Outpatient Prescriptions on File Prior to Visit  Medication Sig Dispense Refill  . acetaminophen (TYLENOL) 500 MG tablet Take 1,000 mg by mouth every 6 (six) hours as needed for mild pain.    Marland Kitchen b complex vitamins tablet Take 1 tablet by mouth daily.    Marland Kitchen BAYER MICROLET LANCETS lancets Use as instructed to check blood sugar 2 times per day dx code E11.9 100 each 2  . cholecalciferol (VITAMIN D) 1000 units  tablet Take 1,000 Units by mouth daily.    . citalopram (CELEXA) 40 MG tablet Take 40 mg by mouth daily.      . clonazePAM (KLONOPIN) 1 MG tablet Take 3 mg by mouth at bedtime.     . diphenhydrAMINE (BENADRYL) 25 MG tablet Take 50 mg by mouth every 6 (six) hours as needed for allergies.    . fenofibrate (TRICOR) 145 MG tablet TAKE 1 TABLET(145 MG) BY MOUTH DAILY (Patient not taking: Reported on 11/23/2016) 30 tablet 2  . glimepiride (AMARYL) 4 MG tablet Take 2 tablets (8 mg total) by mouth daily with breakfast. (Patient taking differently: Take 4 mg by mouth daily with breakfast. ) 60 tablet 0  . glucose blood (BAYER CONTOUR NEXT TEST) test strip USE AS DIRECTED TO TEST BLOOD SUGAR TWICE DAILY 50 each 3  . INVOKAMET XR 50-1000 MG TB24 TAKE 1 TABLET BY MOUTH TWICE DAILY 60 tablet 3  . INVOKAMET XR 50-1000 MG TB24 TAKE 1 TABLET BY MOUTH TWICE DAILY 60 tablet 0  . lamoTRIgine (LAMICTAL) 200 MG tablet Take 200 mg by mouth at bedtime.     Marland Kitchen lithium carbonate (ESKALITH) 450 MG CR tablet Take 1,800 mg by mouth at bedtime.     Marland Kitchen lurasidone (LATUDA) 40 MG TABS Take 40 mg by mouth at bedtime.     . metoprolol succinate (TOPROL-XL) 100 MG 24 hr tablet Take 1 tablet (100 mg total) by mouth daily. 30 tablet 11  . pioglitazone (ACTOS) 15 MG tablet TAKE 1 TABLET(15 MG) BY MOUTH DAILY 30 tablet 2  . QUEtiapine (SEROQUEL) 100 MG tablet Take 200 mg by mouth at bedtime.     . TRULICITY 1.5 ST/4.1DQ SOPN INJECT 1 SYRINGE UNDER THE SKIN WEEKLY 2 mL 0   No current facility-administered medications on file prior to visit.     No Known Allergies  Blood pressure (!) 144/66, pulse 72.   Assessment/Plan: Hypertension: Congratulated him repeatedly on his lifestyle changes and encouraged him to continue. His pressure is elevated today and he prefers to go a head and increase dose of metoprolol. Will  increase his dose to 200mg  daily. Follow up in HTN clinic in 4 weeks.   Thank you, Lelan Pons. Patterson Hammersmith, [facility]  12/16/2016 8:42 AM

## 2016-12-20 DIAGNOSIS — R310 Gross hematuria: Secondary | ICD-10-CM | POA: Diagnosis not present

## 2016-12-20 DIAGNOSIS — C61 Malignant neoplasm of prostate: Secondary | ICD-10-CM | POA: Diagnosis not present

## 2016-12-22 DIAGNOSIS — K7581 Nonalcoholic steatohepatitis (NASH): Secondary | ICD-10-CM | POA: Diagnosis not present

## 2017-01-13 ENCOUNTER — Ambulatory Visit (INDEPENDENT_AMBULATORY_CARE_PROVIDER_SITE_OTHER): Payer: 59 | Admitting: Pharmacist

## 2017-01-13 VITALS — BP 140/76 | HR 78

## 2017-01-13 DIAGNOSIS — I10 Essential (primary) hypertension: Secondary | ICD-10-CM

## 2017-01-13 MED ORDER — AMLODIPINE BESYLATE 5 MG PO TABS: 5.0000 mg | ORAL_TABLET | Freq: Every day | ORAL | 11 refills | Status: DC

## 2017-01-13 NOTE — Progress Notes (Signed)
Patient ID: Frank Cox                 DOB: 26-Oct-1960                      MRN: [id number]     HPI: Frank Cox is a 56 y.o. male patient of Dr. Radford Pax who presents for HTN follow up. PMH is significant for HTN, OSA on CPAP, DM, HLD, lap band procedure in 2011, and bipolar disorder. He is currently on metoprolol and thinks that he has been intolerant to ACE I in the past. He cannot take an ARB or thiazide due to his lithium use. At his most recent OV 1 month ago, his metoprolol was increased to 200mg  daily.  Pt reports feeling well overall. He denies dizziness, blurred vision, headache, or falls. He is still drinking an excessive amount of soda - 2 full 2 liters of diet [facility] each day. He has already had 3 large glasses this morning. He has been trying to drink Hint which is flavored caffeinated water - he states this makes him feel more hydrated than the soda. He took his medication this morning. He rarely checks his BP - he has done so once at work in the past month and recalls a reading of 144/82.  Patient states he has lost about 10-15 lb since our last visit. He states that some policy changes at work have lead to his weight loss, but he has been feeling better and started to notice he is wanting to make healthier choices. He states he now eats mostly weight watchers meals for lunch and has started choosing yogurt instead of chips/candy. He now walks 3 flights of stairs at work several times a day as well. He tries to walk as much as he can at work, but since he works 13 hour shifts, does not have energy to stay active outside of work.  Current HTN meds:Toprol 200mg  daily BP goal: <130/75mmHg  Family History: He states there is no history of high blood pressure in his family and his mother and father are both still living.   Social History: He denies tobacco and states he has had only 1 beer in the last year to celebrate his birthday.   Diet:He eats most of his meals from  home. He does not add salt to his food. He states he has a soda problem and drinks 2 separate 2 liters of soda per day. Had a discussion about cutting back on this and he states he will try to start limiting.   Exercise:No exercise and works a sedentary job. We had a lengthy discussion about slowly increasing his exercise. He states he knows a course through the plant at work that he could start walking. Since he is very hesitant about exercise, but agreed that it would likely benefit his overall health. He was willing to try to walk the course at work one time once a week.   Home BP readings:   Wt Readings from Last 3 Encounters:  11/23/16 (!) 368 lb (166.9 kg)  10/25/16 (!) 364 lb (165.1 kg)  10/07/16 (!) 362 lb 12 oz (164.5 kg)   BP Readings from Last 3 Encounters:  12/16/16 (!) 142/60  11/23/16 (!) 146/68  11/09/16 132/80   Pulse Readings from Last 3 Encounters:  12/16/16 72  11/23/16 68  11/09/16 (!) 58    Renal function: CrCl cannot be calculated (Patient's most recent lab result is  older than the maximum 21 days allowed.).  Past Medical History:  Diagnosis Date  . Anxiety   . Arthritis   . Benign essential HTN 10/01/2016  . Bipolar 1 disorder ([place])    "well controlled on Lithium"  . Bruises easily   . Contact lens/glasses fitting   . Depression   . Diabetes mellitus   . Fatigue   . Generalized headaches   . Hearing loss   . Hyperlipidemia   . Hypertension    boarderline - elevated  . Kidney stone   . Obesity   . OSA on CPAP   . Poor circulation   . Prostate cancer ([place]) 02/14/14   Gleason 4+3=7, volume 56 mL  . S/P radiation therapy 07/03/2014 through 08/28/2014                                                       Prostate 7800 cGy in 40 sessions, seminal vesicles 5600 cGy in 40 sessions                          Current Outpatient Prescriptions on File Prior to Visit  Medication Sig Dispense Refill  . acetaminophen (TYLENOL) 500 MG tablet Take 1,000 mg  by mouth every 6 (six) hours as needed for mild pain.    Marland Kitchen b complex vitamins tablet Take 1 tablet by mouth daily.    Marland Kitchen BAYER MICROLET LANCETS lancets Use as instructed to check blood sugar 2 times per day dx code E11.9 100 each 2  . cholecalciferol (VITAMIN D) 1000 units tablet Take 1,000 Units by mouth daily.    . citalopram (CELEXA) 40 MG tablet Take 40 mg by mouth daily.      . clonazePAM (KLONOPIN) 1 MG tablet Take 3 mg by mouth at bedtime.     . diphenhydrAMINE (BENADRYL) 25 MG tablet Take 50 mg by mouth every 6 (six) hours as needed for allergies.    . fenofibrate (TRICOR) 145 MG tablet TAKE 1 TABLET(145 MG) BY MOUTH DAILY (Patient not taking: Reported on 11/23/2016) 30 tablet 2  . glimepiride (AMARYL) 4 MG tablet Take 2 tablets (8 mg total) by mouth daily with breakfast. (Patient taking differently: Take 4 mg by mouth daily with breakfast. ) 60 tablet 0  . glucose blood (BAYER CONTOUR NEXT TEST) test strip USE AS DIRECTED TO TEST BLOOD SUGAR TWICE DAILY 50 each 3  . INVOKAMET XR 50-1000 MG TB24 TAKE 1 TABLET BY MOUTH TWICE DAILY 60 tablet 3  . INVOKAMET XR 50-1000 MG TB24 TAKE 1 TABLET BY MOUTH TWICE DAILY 60 tablet 0  . lamoTRIgine (LAMICTAL) 200 MG tablet Take 200 mg by mouth at bedtime.     Marland Kitchen lithium carbonate (ESKALITH) 450 MG CR tablet Take 1,800 mg by mouth at bedtime.     Marland Kitchen lurasidone (LATUDA) 40 MG TABS Take 40 mg by mouth at bedtime.     . metoprolol succinate (TOPROL-XL) 200 MG 24 hr tablet Take 1 tablet (200 mg total) by mouth daily. 30 tablet 1  . pioglitazone (ACTOS) 15 MG tablet TAKE 1 TABLET(15 MG) BY MOUTH DAILY 30 tablet 2  . QUEtiapine (SEROQUEL) 100 MG tablet Take 200 mg by mouth at bedtime.     . TRULICITY 1.5 EH/2.0NO SOPN INJECT 1 SYRINGE UNDER THE SKIN  WEEKLY 2 mL 0   No current facility-administered medications on file prior to visit.     No Known Allergies   Assessment/Plan:  1. Hypertension - BP about the same and still above goal <130/45mmHg since  increasing Toprol dose to 200mg  daily. BP medication options are somewhat limited due to patient's psych meds - need to avoid thiazides and ACEi/ARBs with his lithium. Will start amlodipine 5mg  daily and continue Toprol 200mg  daily. There is an interaction between amlodipine and pt's Latuda and Seroquel for risk of hypotension - he will monitor his BP but interaction is not significant enough to drop his BP worryingly low. Pt will try to cut back on his soda intake - goal is to cut back to one 2 liter of diet [facility] by his next visit. F/u for BP check in 1 month.   Megan E. Supple, PharmD, CPP, [place] [address], [place],  [address] Phone: [phone]; Fax: [phone] 01/13/2017 8:52 AM

## 2017-01-13 NOTE — Patient Instructions (Addendum)
Start taking amlodipine 5mg  daily  Continue taking your metoprolol  Try to cut back on your caffeine intake by half - watch your soda intake and substitute with more water  Try to stay active at work and keep moving  Follow up in clinic for blood pressure check in 1 month - call [phone] to schedule follow up appt

## 2017-01-19 ENCOUNTER — Other Ambulatory Visit: Payer: Self-pay | Admitting: Endocrinology

## 2017-01-20 ENCOUNTER — Other Ambulatory Visit (INDEPENDENT_AMBULATORY_CARE_PROVIDER_SITE_OTHER): Payer: 59

## 2017-01-20 DIAGNOSIS — E1165 Type 2 diabetes mellitus with hyperglycemia: Secondary | ICD-10-CM | POA: Diagnosis not present

## 2017-01-20 LAB — COMPREHENSIVE METABOLIC PANEL
ALK PHOS: 57 U/L (ref 39–117)
ALT: 35 U/L (ref 0–53)
AST: 28 U/L (ref 0–37)
Albumin: 4.2 g/dL (ref 3.5–5.2)
BILIRUBIN TOTAL: 0.6 mg/dL (ref 0.2–1.2)
BUN: 12 mg/dL (ref 6–23)
CALCIUM: 11.9 mg/dL — AB (ref 8.4–10.5)
CO2: 28 meq/L (ref 19–32)
CREATININE: 1.49 mg/dL (ref 0.40–1.50)
Chloride: 111 mEq/L (ref 96–112)
GFR: 51.64 mL/min — ABNORMAL LOW (ref >60.00)
GLUCOSE: 118 mg/dL — AB (ref 70–99)
Potassium: 4.4 mEq/L (ref 3.5–5.1)
Sodium: 142 mEq/L (ref 135–145)
TOTAL PROTEIN: 7 g/dL (ref 6.0–8.3)

## 2017-01-20 LAB — LIPID PANEL
Cholesterol: 173 mg/dL (ref 0–200)
HDL: 36.4 mg/dL — ABNORMAL LOW (ref >39.00)
NONHDL: 137.01
Total CHOL/HDL Ratio: 5
Triglycerides: 247 mg/dL — ABNORMAL HIGH (ref 0.0–149.0)
VLDL: 49.4 mg/dL — ABNORMAL HIGH (ref 0.0–40.0)

## 2017-01-20 LAB — LDL CHOLESTEROL, DIRECT: LDL DIRECT: 101 mg/dL

## 2017-01-20 LAB — HEMOGLOBIN A1C: HEMOGLOBIN A1C: 5.8 % (ref 4.6–6.5)

## 2017-01-21 ENCOUNTER — Other Ambulatory Visit: Payer: 59

## 2017-01-22 ENCOUNTER — Other Ambulatory Visit: Payer: Self-pay | Admitting: Endocrinology

## 2017-01-26 ENCOUNTER — Ambulatory Visit (INDEPENDENT_AMBULATORY_CARE_PROVIDER_SITE_OTHER): Payer: 59 | Admitting: Endocrinology

## 2017-01-26 ENCOUNTER — Encounter: Payer: Self-pay | Admitting: Endocrinology

## 2017-01-26 VITALS — BP 131/55 | HR 55 | Ht 70.0 in | Wt 369.4 lb

## 2017-01-26 DIAGNOSIS — E1165 Type 2 diabetes mellitus with hyperglycemia: Secondary | ICD-10-CM | POA: Diagnosis not present

## 2017-01-26 MED ORDER — ICOSAPENT ETHYL 1 G PO CAPS: ORAL_CAPSULE | ORAL | 3 refills | Status: DC

## 2017-01-26 NOTE — Patient Instructions (Addendum)
Walk during day  Invokamet 2 daily  Check blood sugars on waking up    Also check blood sugars about 2 hours after a meal and do this after different meals by rotation  Recommended blood sugar levels on waking up is 90-130 and about 2 hours after meal is 130-160  Please bring your blood sugar monitor to each visit, thank you

## 2017-01-26 NOTE — Progress Notes (Signed)
Patient ID: Frank Cox, male   DOB: 11-04-1960, 56 y.o.   MRN: [id number]           Reason for Appointment: Follow-up for Type 2 Diabetes  Referring physician: Moreen Fowler  History of Present Illness:          Date of diagnosis of type 2 diabetes mellitus:   2010       Background history:   At the time of diagnosis he was treated with metformin which she thinks she took for about 3 years This was stopped when he had his gastric sleeve procedure in 2013; however he lost only about 40-50 pounds with this; his blood sugars did come back to normal After a few years his blood sugars started increasing and he was started on Amaryl His A1c was ranging from 7-7.4 in 2015 and 2016 although from outside lab was 6.5 in 11/15 A1c was 9.9% on his initial endocrinology consultation  Recent history:     Since his initial consultation he has been on a multidrug regimen  Non-insulin hypoglycemic drugs are:  Amaryl 2 mg qd , Trulicity 1.5 mg daily, Invokamet XR 50/1000, 1 tablets daily, Actos 15 mg daily   A1c is about the same at 5.8 although lower than expected for his blood sugars   Current management, blood sugar patterns and problems identified:  His fasting blood sugars appear to be relatively higher than before  He is again checking blood sugars mostly fasting and has only a couple of readings nonfasting  He says he is drinking 3-4 bottles of regular Gatorade during the day sometimes  Also not motivated to exercise even though he can walk at work  Side effects from medications have been: None  Compliance with the medical regimen: Fair  Glucose monitoring:  done 1  times a day         Glucometer: Contour Blood Glucose readings from download as follows  Fasting range 107-177 with average about 128, after lunch 212, suppertime 135, no readings after supper  Self-care: The diet that the patient has been following is: Variable He tries to avoid drinks with sugar but not consistently.   Usually eating at home in the evenings  Supper usually at 6  pm,  Dietician visit, most recent: 3/17               Exercise: walking very little  Weight history:   Wt Readings from Last 3 Encounters:  01/26/17 (!) 369 lb 6.4 oz (167.6 kg)  11/23/16 (!) 368 lb (166.9 kg)  10/25/16 (!) 364 lb (165.1 kg)    Glycemic control:   Lab Results  Component Value Date   HGBA1C 5.8 01/20/2017   HGBA1C 5.7 10/19/2016   HGBA1C 5.5 07/19/2016   Lab Results  Component Value Date   MICROALBUR 7.7 (H) 11/18/2016   LDLCALC 56 10/19/2016   CREATININE 1.49 01/20/2017    Other active problems: See review of systems   Lab on 01/20/2017  Component Date Value Ref Range Status  . Hgb A1c MFr Bld 01/20/2017 5.8  4.6 - 6.5 % Final   Glycemic Control Guidelines for People with Diabetes:Non Diabetic:  <6%Goal of Therapy: <7%Additional Action Suggested:  >8%   . Sodium 01/20/2017 142  135 - 145 mEq/L Final  . Potassium 01/20/2017 4.4  3.5 - 5.1 mEq/L Final  . Chloride 01/20/2017 111  96 - 112 mEq/L Final  . CO2 01/20/2017 28  19 - 32 mEq/L Final  . Glucose, Bld  01/20/2017 118* 70 - 99 mg/dL Final  . BUN 01/20/2017 12  6 - 23 mg/dL Final  . Creatinine, Ser 01/20/2017 1.49  0.40 - 1.50 mg/dL Final  . Total Bilirubin 01/20/2017 0.6  0.2 - 1.2 mg/dL Final  . Alkaline Phosphatase 01/20/2017 57  39 - 117 U/L Final  . AST 01/20/2017 28  0 - 37 U/L Final  . ALT 01/20/2017 35  0 - 53 U/L Final  . Total Protein 01/20/2017 7.0  6.0 - 8.3 g/dL Final  . Albumin 01/20/2017 4.2  3.5 - 5.2 g/dL Final  . Calcium 01/20/2017 11.9* 8.4 - 10.5 mg/dL Final  . GFR 01/20/2017 51.64* >60.00 mL/min Final  . Cholesterol 01/20/2017 173  0 - 200 mg/dL Final   ATP III Classification       Desirable:  < 200 mg/dL               Borderline High:  200 - 239 mg/dL          High:  > = 240 mg/dL  . Triglycerides 01/20/2017 247.0* 0.0 - 149.0 mg/dL Final   Normal:  <150 mg/dLBorderline High:  150 - 199 mg/dL  . HDL 01/20/2017  36.40* >39.00 mg/dL Final  . VLDL 01/20/2017 49.4* 0.0 - 40.0 mg/dL Final  . Total CHOL/HDL Ratio 01/20/2017 5   Final                  Men          Women1/2 Average Risk     3.4          3.3Average Risk          5.0          4.42X Average Risk          9.6          7.13X Average Risk          15.0          11.0                      . NonHDL 01/20/2017 137.01   Final   NOTE:  Non-HDL goal should be 30 mg/dL higher than patient's LDL goal (i.e. LDL goal of < 70 mg/dL, would have non-HDL goal of < 100 mg/dL)  . Direct LDL 01/20/2017 101.0  mg/dL Final   Optimal:  <100 mg/dLNear or Above Optimal:  100-129 mg/dLBorderline High:  130-159 mg/dLHigh:  160-189 mg/dLVery High:  >190 mg/dL     OTHER active problems: See review of systems    Allergies as of 01/26/2017   No Known Allergies     Medication List       Accurate as of 01/26/17  8:32 AM. Always use your most recent med list.          acetaminophen 500 MG tablet Commonly known as:  TYLENOL Take 1,000 mg by mouth every 6 (six) hours as needed for mild pain.   amLODipine 5 MG tablet Commonly known as:  NORVASC Take 1 tablet (5 mg total) by mouth daily.   b complex vitamins tablet Take 1 tablet by mouth daily.   BAYER MICROLET LANCETS lancets Use as instructed to check blood sugar 2 times per day dx code E11.9   cholecalciferol 1000 units tablet Commonly known as:  VITAMIN D Take 1,000 Units by mouth daily.   citalopram 40 MG tablet Commonly known as:  CELEXA Take 40 mg by mouth daily.  clonazePAM 1 MG tablet Commonly known as:  KLONOPIN Take 3 mg by mouth at bedtime.   diphenhydrAMINE 25 MG tablet Commonly known as:  BENADRYL Take 50 mg by mouth every 6 (six) hours as needed for allergies.   fenofibrate 145 MG tablet Commonly known as:  TRICOR TAKE 1 TABLET(145 MG) BY MOUTH DAILY   glimepiride 4 MG tablet Commonly known as:  AMARYL Take 2 tablets (8 mg total) by mouth daily with breakfast.   glucose blood  test strip Commonly known as:  BAYER CONTOUR NEXT TEST USE AS DIRECTED TO TEST BLOOD SUGAR TWICE DAILY   INVOKAMET XR 50-1000 MG Tb24 Generic drug:  Canagliflozin-Metformin HCl ER TAKE 1 TABLET BY MOUTH TWICE DAILY   lamoTRIgine 200 MG tablet Commonly known as:  LAMICTAL Take 200 mg by mouth at bedtime.   LATUDA 40 MG Tabs tablet Generic drug:  lurasidone Take 40 mg by mouth at bedtime.   lithium carbonate 450 MG CR tablet Commonly known as:  ESKALITH Take 1,800 mg by mouth at bedtime.   metoprolol 200 MG 24 hr tablet Commonly known as:  TOPROL-XL Take 1 tablet (200 mg total) by mouth daily.   pioglitazone 15 MG tablet Commonly known as:  ACTOS TAKE 1 TABLET(15 MG) BY MOUTH DAILY   QUEtiapine 100 MG tablet Commonly known as:  SEROQUEL Take 200 mg by mouth at bedtime.   TRULICITY 1.5 WC/5.8NI Sopn Generic drug:  Dulaglutide INJECT 1 SYRINGE UNDER THE SKIN WEEKLY       Allergies: No Known Allergies  Past Medical History:  Diagnosis Date  . Anxiety   . Arthritis   . Benign essential HTN 10/01/2016  . Bipolar 1 disorder ([place])    "well controlled on Lithium"  . Bruises easily   . Contact lens/glasses fitting   . Depression   . Diabetes mellitus   . Fatigue   . Generalized headaches   . Hearing loss   . Hyperlipidemia   . Hypertension    boarderline - elevated  . Kidney stone   . Obesity   . OSA on CPAP   . Poor circulation   . Prostate cancer ([place]) 02/14/14   Gleason 4+3=7, volume 56 mL  . S/P radiation therapy 07/03/2014 through 08/28/2014                                                       Prostate 7800 cGy in 40 sessions, seminal vesicles 5600 cGy in 40 sessions                          Past Surgical History:  Procedure Laterality Date  . LAPAROSCOPIC GASTRIC BANDING  09/09/09  . PROSTATE BIOPSY  02/14/14   Gleason 7, volume 56 mL    Family History  Problem Relation Age of Onset  . Arthritis Mother   . Alcohol abuse Mother   . Arthritis Father      Social History:  reports that he has never smoked. He has never used smokeless tobacco. He reports that he does not drink alcohol or use drugs.    Review of Systems   RENAL dysfunction: His creatinine is Improved and now stable with stopping fenofibrate and reducing Invokana Recent creatinine clearance 51.6  He is also on lithium long-term  Lab Results  Component Value Date   CREATININE 1.49 01/20/2017   BUN 12 01/20/2017   NA 142 01/20/2017   K 4.4 01/20/2017   CL 111 01/20/2017   CO2 28 01/20/2017     HYPERCALCEMIA: Apparently this has been present since 2014 when his PTH level was 37 His level has been variable and now 11.9, on the previous visit was 11.3, has been as high as 12.1  He is not having any Recent history of kidney stones or known osteopenia  Lab Results  Component Value Date   PTH 62 07/19/2016   CALCIUM 11.9 (H) 01/20/2017   CAION 1.30 09/09/2009   PHOS 2.5 07/19/2016    Hepatic steatohepatitis He was told to have early cirrhosis at [facility]    Lab Results  Component Value Date   ALT 35 01/20/2017     Lipid history:  Recently triglycerides are Above 200 He was told to stop his fenofibrate because of renal dysfunction Last LDL as follows     Lab Results  Component Value Date   CHOL 173 01/20/2017   HDL 36.40 (L) 01/20/2017   LDLCALC 56 10/19/2016   LDLDIRECT 101.0 01/20/2017   TRIG 247.0 (H) 01/20/2017   CHOLHDL 5 01/20/2017            Most recent foot exam: 0/5183  Diabetic complications: Appears to have peripheral neuropathy.  Has had increased urine microalbumin.  No reported retinopathy.     Review of Systems    Physical Examination:  BP (!) 131/55   Pulse (!) 55   Ht 5\' 10"  (1.778 m)   Wt (!) 369 lb 6.4 oz (167.6 kg)   SpO2 96%   BMI 53.00 kg/m   ASSESSMENT:  Diabetes type 2, uncontrolled with BMI  51 See history of present illness for  discussion of current diabetes management, blood sugar patterns and  problems identified  His blood sugars are relatively higher this is partly related to reducing the dose of Invokamet as well as his not being consistent with diet or exercise Also checking only fasting readings most of the time  HYPERCALCEMIA: Likely to be from hyperparathyroidism, asymptomatic, long-standing and stable and calcium  higher now  RENAL dysfunction: This is stable  LIPIDS: His triglycerides are higher with stopping fenofibrate, LDL borderline at 101  PLAN:    He can go back to Invokamet twice a day  Vascepa 2 g daily for hyperlipidemia  More readings after meals  Use sugar-free Gatorade  Walk at work  He will have his PCP check his calcium again in 2 weeks and if continuing to be higher will consider parathyroid exploration  Recheck fasting lipids on the next visit    There are no Patient Instructions on file for this visit.  Latish Toutant 01/26/2017, 8:32 AM   Note: This office note was prepared with Dragon voice recognition system technology. Any transcriptional errors that result from this process are unintentional.

## 2017-02-10 ENCOUNTER — Ambulatory Visit: Payer: 59

## 2017-02-15 ENCOUNTER — Encounter: Payer: Self-pay | Admitting: Endocrinology

## 2017-02-15 DIAGNOSIS — E782 Mixed hyperlipidemia: Secondary | ICD-10-CM | POA: Diagnosis not present

## 2017-02-15 DIAGNOSIS — E559 Vitamin D deficiency, unspecified: Secondary | ICD-10-CM | POA: Diagnosis not present

## 2017-02-15 DIAGNOSIS — I10 Essential (primary) hypertension: Secondary | ICD-10-CM | POA: Diagnosis not present

## 2017-02-15 DIAGNOSIS — Z0000 Encounter for general adult medical examination without abnormal findings: Secondary | ICD-10-CM | POA: Diagnosis not present

## 2017-02-18 ENCOUNTER — Ambulatory Visit (INDEPENDENT_AMBULATORY_CARE_PROVIDER_SITE_OTHER): Payer: 59 | Admitting: Pharmacist

## 2017-02-18 VITALS — BP 140/72 | HR 57

## 2017-02-18 DIAGNOSIS — I10 Essential (primary) hypertension: Secondary | ICD-10-CM

## 2017-02-18 MED ORDER — AMLODIPINE BESYLATE 10 MG PO TABS: 10.0000 mg | ORAL_TABLET | Freq: Every day | ORAL | 11 refills | Status: DC

## 2017-02-18 NOTE — Progress Notes (Signed)
Patient ID: Frank Cox                 DOB: 03-12-1961                      MRN: [id number]     HPI: Frank Cox is a 56 y.o. male patient of [facility] who presents for HTN follow up. PMH is significant for HTN, OSA on CPAP, DM, HLD, lap band procedure in 2011, and bipolar disorder.He is currently on metoprolol and thinks that he has been intolerant to ACE I in the past. He cannot take an ARB or thiazide due to his lithium use. At his most recent OV he was started on amlodipine 5mg  daily.   He presents today stating he has cut back on soda to one 2 liter per day and had no soda today. He has been doing well since our last visit and lost a few more pounds.  Current HTN meds:  Toprol 200mg  daily in the morning Amlodipine 5mg  daily in the morning  BP goal: <130/80  Family History: He states there is no history of high blood pressure in his family and his mother and father are both still living.   Social History: He denies tobacco and states he has had only 1 beer in the last year to celebrate his birthday.   Diet:He eats most of his meals from home. He does not add salt to his food. He states he has a soda problem and drinks 2 separate 2 liters of soda per day. Had a discussion about cutting back on this and he states he will try to start limiting.   Exercise:No exercise and works a sedentary job. We had a lengthy discussion about slowly increasing his exercise. He states he knows a course through the plant at work that he could start walking. Since he is very hesitant about exercise, but agreed that it would likely benefit his overall health. He was willing to try to walk the course at work one time once a week.   Home BP readings: checked once - 140s/80s.  Wt Readings from Last 3 Encounters:  01/26/17 (!) 369 lb 6.4 oz (167.6 kg)  11/23/16 (!) 368 lb (166.9 kg)  10/25/16 (!) 364 lb (165.1 kg)   BP Readings from Last 3 Encounters:  02/18/17 140/72  01/26/17 (!) 131/55    01/13/17 140/76   Pulse Readings from Last 3 Encounters:  02/18/17 (!) 57  01/26/17 (!) 55  01/13/17 78    Renal function: CrCl cannot be calculated (Patient's most recent lab result is older than the maximum 21 days allowed.).  Past Medical History:  Diagnosis Date  . Anxiety   . Arthritis   . Benign essential HTN 10/01/2016  . Bipolar 1 disorder ([place])    "well controlled on Lithium"  . Bruises easily   . Contact lens/glasses fitting   . Depression   . Diabetes mellitus   . Fatigue   . Generalized headaches   . Hearing loss   . Hyperlipidemia   . Hypertension    boarderline - elevated  . Kidney stone   . Obesity   . OSA on CPAP   . Poor circulation   . Prostate cancer ([place]) 02/14/14   Gleason 4+3=7, volume 56 mL  . S/P radiation therapy 07/03/2014 through 08/28/2014  Prostate 7800 cGy in 40 sessions, seminal vesicles 5600 cGy in 40 sessions                          Current Outpatient Prescriptions on File Prior to Visit  Medication Sig Dispense Refill  . acetaminophen (TYLENOL) 500 MG tablet Take 1,000 mg by mouth every 6 (six) hours as needed for mild pain.    Marland Kitchen b complex vitamins tablet Take 1 tablet by mouth daily.    Marland Kitchen BAYER MICROLET LANCETS lancets Use as instructed to check blood sugar 2 times per day dx code E11.9 100 each 2  . cholecalciferol (VITAMIN D) 1000 units tablet Take 1,000 Units by mouth daily.    . citalopram (CELEXA) 40 MG tablet Take 40 mg by mouth daily.      . clonazePAM (KLONOPIN) 1 MG tablet Take 3 mg by mouth at bedtime.     . diphenhydrAMINE (BENADRYL) 25 MG tablet Take 50 mg by mouth every 6 (six) hours as needed for allergies.    . fenofibrate (TRICOR) 145 MG tablet TAKE 1 TABLET(145 MG) BY MOUTH DAILY (Patient not taking: Reported on 11/23/2016) 30 tablet 2  . glimepiride (AMARYL) 4 MG tablet Take 2 tablets (8 mg total) by mouth daily with breakfast. (Patient taking differently: Take 4  mg by mouth daily with breakfast. ) 60 tablet 0  . glucose blood (BAYER CONTOUR NEXT TEST) test strip USE AS DIRECTED TO TEST BLOOD SUGAR TWICE DAILY 50 each 3  . Icosapent Ethyl (VASCEPA) 1 g CAPS 2 caps bid 120 capsule 3  . INVOKAMET XR 50-1000 MG TB24 TAKE 1 TABLET BY MOUTH TWICE DAILY 60 tablet 3  . lamoTRIgine (LAMICTAL) 200 MG tablet Take 200 mg by mouth at bedtime.     Marland Kitchen lithium carbonate (ESKALITH) 450 MG CR tablet Take 1,800 mg by mouth at bedtime.     Marland Kitchen lurasidone (LATUDA) 40 MG TABS Take 40 mg by mouth at bedtime.     . metoprolol succinate (TOPROL-XL) 200 MG 24 hr tablet Take 1 tablet (200 mg total) by mouth daily. 30 tablet 1  . pioglitazone (ACTOS) 15 MG tablet TAKE 1 TABLET(15 MG) BY MOUTH DAILY 30 tablet 0  . QUEtiapine (SEROQUEL) 100 MG tablet Take 200 mg by mouth at bedtime.     . TRULICITY 1.5 PZ/0.2HE SOPN INJECT 1 SYRINGE UNDER THE SKIN WEEKLY 2 mL 0   No current facility-administered medications on file prior to visit.     No Known Allergies  Blood pressure 140/72, pulse (!) 57.   Assessment/Plan: Hypertension: BP not at goal today and unchanged from previous. Will increase amlodipine to 10mg  daily. Encouraged him to continue lifestyle modifications including decreasing soda intake. Follow up in HTN clinic in 4-6 weeks.    Thank you, Lelan Pons. [place], [place]

## 2017-02-18 NOTE — Patient Instructions (Addendum)
Return for a follow up appointment in 4-6 weeks  Check your blood pressure at home daily (if able) and keep record of the readings.  Take your BP meds as follows: INCREASE amlodipine to 10mg  (you may take 2 tablets of your current supply until you run out then pick up higher strength and start 1 tablet) daily  [phone]  Bring all of your meds, your BP cuff and your record of home blood pressures to your next appointment.  Exercise as you're able, try to walk approximately 30 minutes per day.  Keep salt intake to a minimum, especially watch canned and prepared boxed foods.  Eat more fresh fruits and vegetables and fewer canned items.  Avoid eating in fast food restaurants.    HOW TO TAKE YOUR BLOOD PRESSURE: . Rest 5 minutes before taking your blood pressure. .  Don't smoke or drink caffeinated beverages for at least 30 minutes before. . Take your blood pressure before (not after) you eat. . Sit comfortably with your back supported and both feet on the floor (don't cross your legs). . Elevate your arm to heart level on a table or a desk. . Use the proper sized cuff. It should fit smoothly and snugly around your bare upper arm. There should be enough room to slip a fingertip under the cuff. The bottom edge of the cuff should be 1 inch above the crease of the elbow. . Ideally, take 3 measurements at one sitting and record the average.

## 2017-02-19 ENCOUNTER — Other Ambulatory Visit: Payer: Self-pay | Admitting: Endocrinology

## 2017-03-01 ENCOUNTER — Encounter: Payer: Self-pay | Admitting: Endocrinology

## 2017-03-01 DIAGNOSIS — I10 Essential (primary) hypertension: Secondary | ICD-10-CM | POA: Diagnosis not present

## 2017-03-01 DIAGNOSIS — E119 Type 2 diabetes mellitus without complications: Secondary | ICD-10-CM | POA: Diagnosis not present

## 2017-03-01 DIAGNOSIS — K7581 Nonalcoholic steatohepatitis (NASH): Secondary | ICD-10-CM | POA: Diagnosis not present

## 2017-03-02 ENCOUNTER — Other Ambulatory Visit: Payer: Self-pay | Admitting: Endocrinology

## 2017-03-11 ENCOUNTER — Other Ambulatory Visit: Payer: Self-pay | Admitting: Endocrinology

## 2017-03-28 ENCOUNTER — Other Ambulatory Visit: Payer: Self-pay | Admitting: Endocrinology

## 2017-03-31 ENCOUNTER — Other Ambulatory Visit: Payer: Self-pay | Admitting: Cardiology

## 2017-03-31 DIAGNOSIS — R310 Gross hematuria: Secondary | ICD-10-CM | POA: Diagnosis not present

## 2017-03-31 DIAGNOSIS — I10 Essential (primary) hypertension: Secondary | ICD-10-CM

## 2017-03-31 DIAGNOSIS — R3912 Poor urinary stream: Secondary | ICD-10-CM | POA: Diagnosis not present

## 2017-04-01 ENCOUNTER — Ambulatory Visit: Payer: 59

## 2017-04-08 DIAGNOSIS — G4733 Obstructive sleep apnea (adult) (pediatric): Secondary | ICD-10-CM | POA: Diagnosis not present

## 2017-04-12 ENCOUNTER — Ambulatory Visit: Payer: 59

## 2017-04-13 ENCOUNTER — Other Ambulatory Visit: Payer: Self-pay | Admitting: Endocrinology

## 2017-04-14 ENCOUNTER — Encounter (HOSPITAL_COMMUNITY): Payer: Self-pay

## 2017-04-17 ENCOUNTER — Other Ambulatory Visit: Payer: Self-pay | Admitting: Endocrinology

## 2017-04-18 ENCOUNTER — Other Ambulatory Visit: Payer: 59

## 2017-04-21 ENCOUNTER — Other Ambulatory Visit (INDEPENDENT_AMBULATORY_CARE_PROVIDER_SITE_OTHER): Payer: 59

## 2017-04-21 ENCOUNTER — Encounter: Payer: Self-pay | Admitting: Pharmacist

## 2017-04-21 ENCOUNTER — Ambulatory Visit (INDEPENDENT_AMBULATORY_CARE_PROVIDER_SITE_OTHER): Payer: 59 | Admitting: Pharmacist

## 2017-04-21 VITALS — BP 144/72 | HR 62

## 2017-04-21 DIAGNOSIS — E1165 Type 2 diabetes mellitus with hyperglycemia: Secondary | ICD-10-CM | POA: Diagnosis not present

## 2017-04-21 DIAGNOSIS — I10 Essential (primary) hypertension: Secondary | ICD-10-CM

## 2017-04-21 LAB — LIPID PANEL
CHOLESTEROL: 125 mg/dL (ref 0–200)
HDL: 34.8 mg/dL — ABNORMAL LOW (ref >39.00)
LDL Cholesterol: 54 mg/dL (ref 0–99)
NonHDL: 89.96
TRIGLYCERIDES: 179 mg/dL — AB (ref 0.0–149.0)
Total CHOL/HDL Ratio: 4
VLDL: 35.8 mg/dL (ref 0.0–40.0)

## 2017-04-21 LAB — COMPREHENSIVE METABOLIC PANEL
ALBUMIN: 3.8 g/dL (ref 3.5–5.2)
ALT: 26 U/L (ref 0–53)
AST: 18 U/L (ref 0–37)
Alkaline Phosphatase: 46 U/L (ref 39–117)
BUN: 10 mg/dL (ref 6–23)
CHLORIDE: 115 meq/L — AB (ref 96–112)
CO2: 24 meq/L (ref 19–32)
Calcium: 11.4 mg/dL — ABNORMAL HIGH (ref 8.4–10.5)
Creatinine, Ser: 1.24 mg/dL (ref 0.40–1.50)
GFR: 63.77 mL/min (ref >60.00)
Glucose, Bld: 76 mg/dL (ref 70–99)
POTASSIUM: 4 meq/L (ref 3.5–5.1)
SODIUM: 143 meq/L (ref 135–145)
Total Bilirubin: 0.6 mg/dL (ref 0.2–1.2)
Total Protein: 6.4 g/dL (ref 6.0–8.3)

## 2017-04-21 LAB — HEMOGLOBIN A1C: HEMOGLOBIN A1C: 5.7 % (ref 4.6–6.5)

## 2017-04-21 MED ORDER — CARVEDILOL 25 MG PO TABS: 25.0000 mg | ORAL_TABLET | Freq: Two times a day (BID) | ORAL | 1 refills | Status: DC

## 2017-04-21 NOTE — Patient Instructions (Addendum)
It was good seeing you today.  Your blood pressure is still above your goal today. Stop taking your metoprolol.  Start a new medication carvedilol 25 mg by mouth 1 tablet twice daily.   Continue taking the amlodipine 10 mg as you have before.   Try to check your blood pressure and try to keep a written log of the readings at home.   If you experience increasing dizziness, lightheadedness, or weakness/fatigue please the contact at [phone].  Follow up in clinic in 4 to 6 weeks.

## 2017-04-21 NOTE — Progress Notes (Signed)
Patient ID: Frank Cox                 DOB: 09-28-1960                      MRN: [id number]     HPI: Frank Cox is a 56 y.o. male patient of Frank Cox who presents for HTN follow up.PMH is significant for HTN, OSA on CPAP, DM, HLD, lap band procedure in 2011, and bipolar disorder.He is currently on metoprolol and thinks that he has been intolerant to ACE I in the past. He cannot take an ARB or thiazide due to his lithium use. At his last HTN clinic visit, BP was not at goal and amlodipine was increased 10 mg daily.   Pt presents today for f/u. He reports that he is doing fine on the amlodipine dose increase. States that he felt a little woozy about 1 week after the dose increase, but recently has not felt any dizziness or lightheadedness out of the ordinary. Denies having any weakness, falls, or swelling. Pt states that he has been cutting back on soda, drinks more Gatorade Zero now. Still trying to eat healthy and controlling his food portions. Does not exercise frequently, tries to walk at work occasionally. BP in clinic is 144/72 and HR is 62.   Current HTN meds:  Toprol 200mg  daily in the morning (5:30 AM) Amlodipine 10mg  daily in the morning (5:30 AM)  BP goal: <130/80 mmHg  Family History: He states there is no history of high blood pressure in his family and his mother and father are both still living.   Social History: He denies tobacco and states he has had only 1 beer in the last year to celebrate his birthday.   Diet: He eats most of his meals from home. He does not add salt to his food. He states he has a soda problem and drinks 2 separate 2 liters of soda per day. Had a discussion about cutting back on this and he states he will try to start limiting.   Exercise: No exercise and works a sedentary job. We had a lengthy discussion about slowly increasing his exercise. He states he knows a course through the plant at work that he could start walking. Since he is very hesitant  about exercise, but agreed that it would likely benefit his overall health. He was willing to try to walk the course at work one time once a week.   Home BP readings: 140s/80s last check   Wt Readings from Last 3 Encounters:  01/26/17 (!) 369 lb 6.4 oz (167.6 kg)  11/23/16 (!) 368 lb (166.9 kg)  10/25/16 (!) 364 lb (165.1 kg)   BP Readings from Last 3 Encounters:  02/18/17 140/72  01/26/17 (!) 131/55  01/13/17 140/76   Pulse Readings from Last 3 Encounters:  02/18/17 (!) 57  01/26/17 (!) 55  01/13/17 78    Renal function: CrCl cannot be calculated (Patient's most recent lab result is older than the maximum 21 days allowed.).  Past Medical History:  Diagnosis Date  . Anxiety   . Arthritis   . Benign essential HTN 10/01/2016  . Bipolar 1 disorder (Frank Cox)    "well controlled on Lithium"  . Bruises easily   . Contact lens/glasses fitting   . Depression   . Diabetes mellitus   . Fatigue   . Generalized headaches   . Hearing loss   . Hyperlipidemia   . Hypertension  boarderline - elevated  . Kidney stone   . Obesity   . OSA on CPAP   . Poor circulation   . Prostate cancer (Frank Cox) 02/14/14   Gleason 4+3=7, volume 56 mL  . S/P radiation therapy 07/03/2014 through 08/28/2014                                                       Prostate 7800 cGy in 40 sessions, seminal vesicles 5600 cGy in 40 sessions                          Current Outpatient Prescriptions on File Prior to Visit  Medication Sig Dispense Refill  . acetaminophen (TYLENOL) 500 MG tablet Take 1,000 mg by mouth every 6 (six) hours as needed for mild pain.    Frank Cox Kitchen amLODipine (NORVASC) 10 MG tablet Take 1 tablet (10 mg total) by mouth daily. 30 tablet 11  . b complex vitamins tablet Take 1 tablet by mouth daily.    Frank Cox Kitchen BAYER MICROLET LANCETS lancets Use as instructed to check blood sugar 2 times per day dx code E11.9 100 each 2  . cholecalciferol (VITAMIN D) 1000 units tablet Take 1,000 Units by mouth daily.    .  citalopram (CELEXA) 40 MG tablet Take 40 mg by mouth daily.      . clonazePAM (KLONOPIN) 1 MG tablet Take 3 mg by mouth at bedtime.     . CONTOUR NEXT TEST test strip USE TWICE DAILY AS DIRECTED TO TEST BLOOD SUGAR 100 each 3  . diphenhydrAMINE (BENADRYL) 25 MG tablet Take 50 mg by mouth every 6 (six) hours as needed for allergies.    . fenofibrate (TRICOR) 145 MG tablet TAKE 1 TABLET(145 MG) BY MOUTH DAILY (Patient not taking: Reported on 11/23/2016) 30 tablet 2  . glimepiride (AMARYL) 4 MG tablet Take 2 tablets (8 mg total) by mouth daily with breakfast. (Patient taking differently: Take 4 mg by mouth daily with breakfast. ) 60 tablet 0  . glucose blood (BAYER CONTOUR NEXT TEST) test strip USE AS DIRECTED TO TEST BLOOD SUGAR TWICE DAILY 50 each 3  . Icosapent Ethyl (VASCEPA) 1 g CAPS 2 caps bid 120 capsule 3  . INVOKAMET XR 50-1000 MG TB24 TAKE 1 TABLET BY MOUTH TWICE DAILY 60 tablet 3  . INVOKAMET XR 50-1000 MG TB24 TAKE 1 TABLET BY MOUTH TWICE DAILY 60 tablet 0  . lamoTRIgine (LAMICTAL) 200 MG tablet Take 200 mg by mouth at bedtime.     Frank Cox Kitchen lithium carbonate (ESKALITH) 450 MG CR tablet Take 1,800 mg by mouth at bedtime.     Frank Cox Kitchen lurasidone (LATUDA) 40 MG TABS Take 40 mg by mouth at bedtime.     . metoprolol (TOPROL-XL) 200 MG 24 hr tablet TAKE 1 TABLET BY MOUTH ONCE DAILY 90 tablet 1  . pioglitazone (ACTOS) 15 MG tablet TAKE 1 TABLET(15 MG) BY MOUTH DAILY 30 tablet 0  . QUEtiapine (SEROQUEL) 100 MG tablet Take 200 mg by mouth at bedtime.     . TRULICITY 1.5 ZH/0.8MV SOPN INJECT 1 SYRINGE UNDER THE SKIN WEEKLY 2 mL 0   No current facility-administered medications on file prior to visit.     No Known Allergies   Assessment/Plan: Hypertension: Pt's BP was not at his goal of <130/80 mmHg.  Stop Toprol 200 mg daily. Start carvedilol 25 mg BID for additional BP control and continue amlodipine 10 mg daily. Encouraged pt to check BP more frequently at home and continue to eat well and increase exercise  frequency. F/u in clinic in 4 to 6 weeks. Pt will contact the clinic regarding work schedule before we schedule the f/u appointment.   -Frank Cox, PharmD Student  Thank you, Lelan Pons. [facility], [place]  [address] [place], [place] [address]  Phone: [phone]; Fax: [phone] 04/21/2017 3:52 PM

## 2017-04-22 LAB — FRUCTOSAMINE: FRUCTOSAMINE: 194 umol/L (ref 0–285)

## 2017-04-24 NOTE — Progress Notes (Signed)
Patient ID: Frank Cox, male   DOB: 1960/12/12, 56 y.o.   MRN: [id number]           Reason for Appointment: Follow-up for Type 2 Diabetes  Referring physician: Moreen Fowler  History of Present Illness:          Date of diagnosis of type 2 diabetes mellitus:   2010       Background history:   At the time of diagnosis he was treated with metformin which she thinks she took for about 3 years This was stopped when he had his gastric sleeve procedure in 2013; however he lost only about 40-50 pounds with this; his blood sugars did come back to normal After a few years his blood sugars started increasing and he was started on Amaryl His A1c was ranging from 7-7.4 in 2015 and 2016 although from outside lab was 6.5 in 11/15 A1c was 9.9% on his initial endocrinology consultation  Recent history:     Since his initial consultation he has been on a multidrug regimen  Non-insulin hypoglycemic drugs are:  Amaryl 2 mg qd , Trulicity 1.5 mg daily, Invokamet XR 50/1000, 1 tablets daily, Actos 15 mg daily   A1c is about the same at 5.7 although lower than expected for his blood sugars Fructosamine also done on this visit correlates with his A1c  Current management, blood sugar patterns and problems identified:  Despite reminders he does not check his blood blood sugars after meals FASTING blood sugars at home are ranging from 80-1 74, AVERAGE overall 123  His fasting blood sugars appear to be relatively higher on weekends  Has gained weight, he thinks he is however trying to watch his fat intake and get more vegetables  He says he is drinking less regular Gatorade and is trying to use the Gatorade 0   Also not motivated to exercise, likely to be from his depression   Side effects from medications have been: None  Compliance with the medical regimen: Fair  Glucose monitoring:  done 1  times a day         Glucometer: Contour Blood Glucose readings from download as above   Self-care: The  diet that the patient has been following is: Variable He tries to avoid drinks with sugar but not consistently.  Usually eating at home in the evenings   Supper usually at 6  pm,  Dietician visit, most recent: 3/17               Exercise: walking very little  Weight history:   Wt Readings from Last 3 Encounters:  04/25/17 (!) 375 lb (170.1 kg)  01/26/17 (!) 369 lb 6.4 oz (167.6 kg)  11/23/16 (!) 368 lb (166.9 kg)    Glycemic control:   Lab Results  Component Value Date   HGBA1C 5.7 04/21/2017   HGBA1C 5.8 01/20/2017   HGBA1C 5.7 10/19/2016   Lab Results  Component Value Date   MICROALBUR 7.7 (H) 11/18/2016   LDLCALC 54 04/21/2017   CREATININE 1.24 04/21/2017    Lab Results  Component Value Date   FRUCTOSAMINE 194 04/21/2017   FRUCTOSAMINE 263 02/16/2016    Other active problems: See review of systems   Lab on 04/21/2017  Component Date Value Ref Range Status  . Hgb A1c MFr Bld 04/21/2017 5.7  4.6 - 6.5 % Final   Glycemic Control Guidelines for People with Diabetes:Non Diabetic:  <6%Goal of Therapy: <7%Additional Action Suggested:  >8%   . Sodium 04/21/2017  143  135 - 145 mEq/L Final  . Potassium 04/21/2017 4.0  3.5 - 5.1 mEq/L Final  . Chloride 04/21/2017 115* 96 - 112 mEq/L Final  . CO2 04/21/2017 24  19 - 32 mEq/L Final  . Glucose, Bld 04/21/2017 76  70 - 99 mg/dL Final  . BUN 04/21/2017 10  6 - 23 mg/dL Final  . Creatinine, Ser 04/21/2017 1.24  0.40 - 1.50 mg/dL Final  . Total Bilirubin 04/21/2017 0.6  0.2 - 1.2 mg/dL Final  . Alkaline Phosphatase 04/21/2017 46  39 - 117 U/L Final  . AST 04/21/2017 18  0 - 37 U/L Final  . ALT 04/21/2017 26  0 - 53 U/L Final  . Total Protein 04/21/2017 6.4  6.0 - 8.3 g/dL Final  . Albumin 04/21/2017 3.8  3.5 - 5.2 g/dL Final  . Calcium 04/21/2017 11.4* 8.4 - 10.5 mg/dL Final  . GFR 04/21/2017 63.77  >60.00 mL/min Final  . Fructosamine 04/21/2017 194  0 - 285 umol/L Final   Comment: Published reference interval for  apparently healthy subjects between age 60 and 18 is 82 - 285 umol/L and in a poorly controlled diabetic population is 228 - 563 umol/L with a mean of 396 umol/L.   Marland Kitchen Cholesterol 04/21/2017 125  0 - 200 mg/dL Final   ATP III Classification       Desirable:  < 200 mg/dL               Borderline High:  200 - 239 mg/dL          High:  > = 240 mg/dL  . Triglycerides 04/21/2017 179.0* 0.0 - 149.0 mg/dL Final   Normal:  <150 mg/dLBorderline High:  150 - 199 mg/dL  . HDL 04/21/2017 34.80* >39.00 mg/dL Final  . VLDL 04/21/2017 35.8  0.0 - 40.0 mg/dL Final  . LDL Cholesterol 04/21/2017 54  0 - 99 mg/dL Final  . Total CHOL/HDL Ratio 04/21/2017 4   Final                  Men          Women1/2 Average Risk     3.4          3.3Average Risk          5.0          4.42X Average Risk          9.6          7.13X Average Risk          15.0          11.0                      . NonHDL 04/21/2017 89.96   Final   NOTE:  Non-HDL goal should be 30 mg/dL higher than patient's LDL goal (i.e. LDL goal of < 70 mg/dL, would have non-HDL goal of < 100 mg/dL)     OTHER active problems: See review of systems    Allergies as of 04/25/2017   No Active Allergies     Medication List       Accurate as of 04/25/17  8:46 AM. Always use your most recent med list.          acetaminophen 500 MG tablet Commonly known as:  TYLENOL Take 1,000 mg by mouth every 6 (six) hours as needed for mild pain.   amLODipine 10 MG tablet Commonly known as:  NORVASC Take  1 tablet (10 mg total) by mouth daily.   b complex vitamins tablet Take 1 tablet by mouth daily.   BAYER MICROLET LANCETS lancets Use as instructed to check blood sugar 2 times per day dx code E11.9   carvedilol 25 MG tablet Commonly known as:  COREG Take 1 tablet (25 mg total) by mouth 2 (two) times daily.   cholecalciferol 1000 units tablet Commonly known as:  VITAMIN D Take 1,000 Units by mouth daily.   citalopram 40 MG tablet Commonly known as:   CELEXA Take 40 mg by mouth daily.   clonazePAM 1 MG tablet Commonly known as:  KLONOPIN Take 3 mg by mouth at bedtime.   diphenhydrAMINE 25 MG tablet Commonly known as:  BENADRYL Take 50 mg by mouth every 6 (six) hours as needed for allergies.   glimepiride 4 MG tablet Commonly known as:  AMARYL Take 2 tablets (8 mg total) by mouth daily with breakfast.   glucose blood test strip Commonly known as:  BAYER CONTOUR NEXT TEST USE AS DIRECTED TO TEST BLOOD SUGAR TWICE DAILY   CONTOUR NEXT TEST test strip Generic drug:  glucose blood USE TWICE DAILY AS DIRECTED TO TEST BLOOD SUGAR   Icosapent Ethyl 1 g Caps Commonly known as:  VASCEPA 2 caps bid   INVOKAMET XR 50-1000 MG Tb24 Generic drug:  Canagliflozin-Metformin HCl ER TAKE 1 TABLET BY MOUTH TWICE DAILY   lamoTRIgine 200 MG tablet Commonly known as:  LAMICTAL Take 200 mg by mouth at bedtime.   LATUDA 40 MG Tabs tablet Generic drug:  lurasidone Take 40 mg by mouth at bedtime.   lithium carbonate 450 MG CR tablet Commonly known as:  ESKALITH Take 1,800 mg by mouth at bedtime.   pioglitazone 15 MG tablet Commonly known as:  ACTOS TAKE 1 TABLET(15 MG) BY MOUTH DAILY   QUEtiapine 100 MG tablet Commonly known as:  SEROQUEL Take 200 mg by mouth at bedtime.   TRULICITY 1.5 QI/3.4VQ Sopn Generic drug:  Dulaglutide INJECT 1 SYRINGE UNDER THE SKIN WEEKLY       Allergies:  No Active Allergies  Past Medical History:  Diagnosis Date  . Anxiety   . Arthritis   . Benign essential HTN 10/01/2016  . Bipolar 1 disorder ([place])    "well controlled on Lithium"  . Bruises easily   . Contact lens/glasses fitting   . Depression   . Diabetes mellitus   . Fatigue   . Generalized headaches   . Hearing loss   . Hyperlipidemia   . Hypertension    boarderline - elevated  . Kidney stone   . Obesity   . OSA on CPAP   . Poor circulation   . Prostate cancer ([place]) 02/14/14   Gleason 4+3=7, volume 56 mL  . S/P radiation  therapy 07/03/2014 through 08/28/2014                                                       Prostate 7800 cGy in 40 sessions, seminal vesicles 5600 cGy in 40 sessions                          Past Surgical History:  Procedure Laterality Date  . LAPAROSCOPIC GASTRIC BANDING  09/09/09  . PROSTATE BIOPSY  02/14/14   Gleason  7, volume 56 mL    Family History  Problem Relation Age of Onset  . Arthritis Mother   . Alcohol abuse Mother   . Arthritis Father     Social History:  reports that he has never smoked. He has never used smokeless tobacco. He reports that he does not drink alcohol or use drugs.    Review of Systems   RENAL dysfunction: His creatinine is Improved and now stable with stopping fenofibrate and reducing Invokana Recent creatinine clearance 51.6  He is also on lithium long-term  Lab Results  Component Value Date   CREATININE 1.24 04/21/2017   BUN 10 04/21/2017   NA 143 04/21/2017   K 4.0 04/21/2017   CL 115 (H) 04/21/2017   CO2 24 04/21/2017     HYPERCALCEMIA: Apparently this has been present since 2014 when his PTH level was 37 His level has been variable and now 11.4, previously 11.9, on the previous visit was 11.3, has been as high as 12.1  He is not having any Recent history of kidney stones or known osteopenia  Lab Results  Component Value Date   PTH 62 07/19/2016   CALCIUM 11.4 (H) 04/21/2017   CAION 1.30 09/09/2009   PHOS 2.5 07/19/2016    Hepatic steatohepatitis He was [facility] to have early cirrhosis [facility] Results  Component Value Date   ALT 26 04/21/2017     Lipid history:  Recently triglycerides are Now below 200  He was told to stop his fenofibrate because of renal dysfunction  He was given Vascepa on his last visit and he says that after a week or so he was having some urethral bleeding and he was told by urologist to stop taking Vascepa  Labs:    Lab Results  Component Value Date   CHOL 125 04/21/2017   HDL  34.80 (L) 04/21/2017   LDLCALC 54 04/21/2017   LDLDIRECT 101.0 01/20/2017   TRIG 179.0 (H) 04/21/2017   CHOLHDL 4 04/21/2017            Most recent foot exam: 09/4780  Diabetic complications: Appears to have peripheral neuropathy.  Has had increased urine microalbumin.  No reported retinopathy.     Review of Systems     Physical Examination:  BP 122/68   Pulse (!) 56   Ht 5' 9.75" (1.772 m)   Wt (!) 375 lb (170.1 kg)   SpO2 96%   BMI 54.19 kg/m   ASSESSMENT:  Diabetes type 2, uncontrolled with BMI  51 See history of present illness for  discussion of current diabetes management, blood sugar patterns and problems identified  His blood sugars are Generally well controlled now with excellent A1c He is checking fasting readings mostly and these are overall excellent on an average, somewhat higher on weekends He continues to forget checking his blood sugars after evening meals and this was discussed This may also help him comply with diet better Again not motivated to exercise  HYPERCALCEMIA: Likely to be from hyperparathyroidism, asymptomatic, long-standing He has no history of kidney stones or osteopenia Although calcium was 12.5 in June it is much better at 11.4 and will continue to monitor Again discussed that he is not candidate for parathyroid surgery  RENAL dysfunction: This is stable even with continuing full dose of Invokana/metformin  LIPIDS: His triglycerides are surprisingly better even though he is not taking fenofibrate or fish oil LDL is also improved Discussed that it is very unlikely that  Vascepa causes bleeding from the prostate area   PLAN:    He Will check to see if Ozempic is covered on his plan, this will likely improve his weight and postprandial readings better than Trulicity  He will be checking some readings at bedtime also  Encouraged him to start walking when he can  More consistent low fat meals and avoid biscuits in the  morning  Continue to monitor calcium regularly, consider doing a bone density    Patient Instructions  Check cost of Ozempic  Must do some sugars at bedtime  More walking  Low fat meals   Frank Cox 04/25/2017, 8:46 AM   Note: This office note was prepared with Estate agent. Any transcriptional errors that result from this process are unintentional.      Total visit time for evaluation and management of multiple problems, review of blood sugar download, labs, counseling = 25 minutes

## 2017-04-25 ENCOUNTER — Encounter: Payer: Self-pay | Admitting: Endocrinology

## 2017-04-25 ENCOUNTER — Ambulatory Visit (INDEPENDENT_AMBULATORY_CARE_PROVIDER_SITE_OTHER): Payer: 59 | Admitting: Endocrinology

## 2017-04-25 VITALS — BP 122/68 | HR 56 | Ht 69.75 in | Wt 375.0 lb

## 2017-04-25 DIAGNOSIS — E782 Mixed hyperlipidemia: Secondary | ICD-10-CM | POA: Diagnosis not present

## 2017-04-25 DIAGNOSIS — E1165 Type 2 diabetes mellitus with hyperglycemia: Secondary | ICD-10-CM

## 2017-04-25 NOTE — Patient Instructions (Signed)
Check cost of Ozempic  Must do some sugars at bedtime  More walking  Low fat meals

## 2017-04-29 ENCOUNTER — Other Ambulatory Visit: Payer: Self-pay | Admitting: Endocrinology

## 2017-05-10 ENCOUNTER — Encounter (HOSPITAL_COMMUNITY): Payer: Self-pay

## 2017-05-10 ENCOUNTER — Emergency Department (HOSPITAL_COMMUNITY): Payer: 59

## 2017-05-10 ENCOUNTER — Emergency Department (HOSPITAL_COMMUNITY)
Admission: EM | Admit: 2017-05-10 | Discharge: 2017-05-10 | Disposition: A | Discharge status: Home / Self Care | Payer: 59 | Attending: Emergency Medicine | Admitting: Emergency Medicine

## 2017-05-10 DIAGNOSIS — Z8546 Personal history of malignant neoplasm of prostate: Secondary | ICD-10-CM | POA: Diagnosis not present

## 2017-05-10 DIAGNOSIS — R0602 Shortness of breath: Secondary | ICD-10-CM | POA: Diagnosis not present

## 2017-05-10 DIAGNOSIS — Z79899 Other long term (current) drug therapy: Secondary | ICD-10-CM | POA: Insufficient documentation

## 2017-05-10 DIAGNOSIS — R3912 Poor urinary stream: Secondary | ICD-10-CM | POA: Diagnosis not present

## 2017-05-10 DIAGNOSIS — R05 Cough: Secondary | ICD-10-CM | POA: Insufficient documentation

## 2017-05-10 DIAGNOSIS — Z7984 Long term (current) use of oral hypoglycemic drugs: Secondary | ICD-10-CM | POA: Diagnosis not present

## 2017-05-10 DIAGNOSIS — I10 Essential (primary) hypertension: Secondary | ICD-10-CM | POA: Insufficient documentation

## 2017-05-10 DIAGNOSIS — E119 Type 2 diabetes mellitus without complications: Secondary | ICD-10-CM | POA: Diagnosis not present

## 2017-05-10 DIAGNOSIS — R531 Weakness: Secondary | ICD-10-CM | POA: Diagnosis not present

## 2017-05-10 DIAGNOSIS — R079 Chest pain, unspecified: Secondary | ICD-10-CM | POA: Diagnosis not present

## 2017-05-10 DIAGNOSIS — R0789 Other chest pain: Secondary | ICD-10-CM | POA: Diagnosis present

## 2017-05-10 HISTORY — DX: Calculus of gallbladder without cholecystitis without obstruction: K80.20

## 2017-05-10 LAB — BASIC METABOLIC PANEL
ANION GAP: 5 (ref 5–15)
BUN: 13 mg/dL (ref 6–20)
CO2: 23 mmol/L (ref 22–32)
Calcium: 11.2 mg/dL — ABNORMAL HIGH (ref 8.9–10.3)
Chloride: 115 mmol/L — ABNORMAL HIGH (ref 101–111)
Creatinine, Ser: 1.33 mg/dL — ABNORMAL HIGH (ref 0.61–1.24)
GFR, EST NON AFRICAN AMERICAN: 58 mL/min — AB (ref >60)
Glucose, Bld: 119 mg/dL — ABNORMAL HIGH (ref 65–99)
POTASSIUM: 3.9 mmol/L (ref 3.5–5.1)
SODIUM: 143 mmol/L (ref 135–145)

## 2017-05-10 LAB — POCT I-STAT TROPONIN I
TROPONIN I, POC: 0 ng/mL (ref 0.00–0.08)
Troponin i, poc: 0 ng/mL (ref 0.00–0.08)

## 2017-05-10 LAB — CBC
HEMATOCRIT: 37.3 % — AB (ref 39.0–52.0)
HEMOGLOBIN: 11.7 g/dL — AB (ref 13.0–17.0)
MCH: 30.1 pg (ref 26.0–34.0)
MCHC: 31.4 g/dL (ref 30.0–36.0)
MCV: 95.9 fL (ref 78.0–100.0)
Platelets: 260 10*3/uL (ref 150–400)
RBC: 3.89 MIL/uL — ABNORMAL LOW (ref 4.22–5.81)
RDW: 14.9 % (ref 11.5–15.5)
WBC: 9 10*3/uL (ref 4.0–10.5)

## 2017-05-10 LAB — BRAIN NATRIURETIC PEPTIDE: B Natriuretic Peptide: 30.7 pg/mL (ref 0.0–100.0)

## 2017-05-10 LAB — D-DIMER, QUANTITATIVE (NOT AT ARMC): D DIMER QUANT: 0.54 ug{FEU}/mL — AB (ref 0.00–0.50)

## 2017-05-10 NOTE — ED Notes (Signed)
While entering patient's room in attempt to obtain orthostatic vital signs, patient seen standing on stool in room urinating in the sink. Attempted to provide patient with urinal. Patient states "I am almost done now."

## 2017-05-10 NOTE — ED Notes (Signed)
Pt ambulatory and independent at discharge.  Verbalized understanding of discharge instructions 

## 2017-05-10 NOTE — ED Triage Notes (Signed)
Patient c/o weakness and intermittent chest pain a few days ago. Patient states chest pain always starts when he is lying down. "I just feel like I am give out."

## 2017-05-10 NOTE — ED Provider Notes (Signed)
[place] DEPT Provider Note   CSN: [id number] Arrival date & time: 05/10/17  1105     History   Chief Complaint Chief Complaint  Patient presents with  . Weakness  . Chest Pain    HPI Frank Cox is a 56 y.o. male.  The history is provided by the patient. No language interpreter was used.  Weakness  Associated symptoms include chest pain.  Chest Pain   Associated symptoms include weakness.    Frank Cox is a 56 y.o. male who presents to the Emergency Department complaining of weakness, chest pain.  Reports episodic left sided chest pain that feels like a pounding in his chest. Symptoms have been waxing and waning over the last week, they happen more at nighttime when he makes down. He reports associated shortness of breath and a nonproductive cough. He denies any fevers. He has chronic bilateral lower extremity edema, unchanged from baseline. He has a history of stress test in the past. He has no history of blood clots. He does have a history of prostate cancer and it thought to be in remission.  Past Medical History:  Diagnosis Date  . Anxiety   . Arthritis   . Benign essential HTN 10/01/2016  . Bipolar 1 disorder ([place])    "well controlled on Lithium"  . Bruises easily   . Contact lens/glasses fitting   . Depression   . Diabetes mellitus   . Fatigue   . Gallstones   . Generalized headaches   . Hearing loss   . Hyperlipidemia   . Hypertension    boarderline - elevated  . Kidney stone   . Obesity   . OSA on CPAP   . Poor circulation   . Prostate cancer ([place]) 02/14/14   Gleason 4+3=7, volume 56 mL  . S/P radiation therapy 07/03/2014 through 08/28/2014                                                       Prostate 7800 cGy in 40 sessions, seminal vesicles 5600 cGy in 40 sessions                          Patient Active Problem List   Diagnosis Date Noted  . Benign essential HTN 10/01/2016  . Morbid obesity ([place]) 11/22/2015  . Acute lower GI bleeding  09/26/2014  . Bipolar 1 disorder ([place]) 09/26/2014  . GI bleed 09/26/2014  . Malignant neoplasm of prostate ([place]) 03/19/2014  . Cellulitis of right leg without foot 11/17/2013  . Cellulitis 11/17/2013  . Diabetes mellitus, type II ([place]) 11/17/2013  . OSA (obstructive sleep apnea) 11/17/2013  . Depression   . Lapband APL Jan 2011 12/02/2011    Past Surgical History:  Procedure Laterality Date  . LAPAROSCOPIC GASTRIC BANDING  09/09/09  . PROSTATE BIOPSY  02/14/14   Gleason 7, volume 56 mL       Home Medications    Prior to Admission medications   Medication Sig Start Date End Date Taking? Authorizing Provider  amLODipine (NORVASC) 10 MG tablet Take 1 tablet (10 mg total) by mouth daily. 02/18/17 05/19/17 Yes Turner, Eber Hong, MD  b complex vitamins tablet Take 1 tablet by mouth daily.   Yes [provider]  carvedilol (COREG) 25 MG tablet Take 1  tablet (25 mg total) by mouth 2 (two) times daily. 04/21/17 06/20/17 Yes Turner, Eber Hong, MD  cholecalciferol (VITAMIN D) 1000 units tablet Take 1,000 Units by mouth daily.   Yes [provider]  citalopram (CELEXA) 40 MG tablet Take 40 mg by mouth daily.     Yes [provider]  clonazePAM (KLONOPIN) 1 MG tablet Take 3 mg by mouth at bedtime.    Yes [provider]  diphenhydrAMINE (BENADRYL) 25 MG tablet Take 50 mg by mouth every 6 (six) hours as needed for allergies.   Yes [provider]  glimepiride (AMARYL) 4 MG tablet Take 2 tablets (8 mg total) by mouth daily with breakfast. Patient taking differently: Take 4 mg by mouth 2 (two) times daily.  11/19/13  Yes Vann, Jessica U, DO  INVOKAMET XR 50-1000 MG TB24 TAKE 1 TABLET BY MOUTH TWICE DAILY 01/06/16  Yes Elayne Snare, MD  lamoTRIgine (LAMICTAL) 200 MG tablet Take 400 mg by mouth at bedtime.    Yes [provider]  lithium carbonate (ESKALITH) 450 MG CR tablet Take 1,800 mg by mouth at bedtime.    Yes [provider]  lurasidone  (LATUDA) 40 MG TABS Take 40 mg by mouth at bedtime.    Yes [provider]  metoprolol (TOPROL-XL) 200 MG 24 hr tablet Take 200 mg by mouth daily. 04/15/17  Yes [provider]  pioglitazone (ACTOS) 15 MG tablet TAKE 1 TABLET(15 MG) BY MOUTH DAILY 05/02/17  Yes Elayne Snare, MD  QUEtiapine (SEROQUEL) 100 MG tablet Take 200 mg by mouth at bedtime.  06/10/14  Yes [provider]  tamsulosin (FLOMAX) 0.4 MG CAPS capsule Take 0.8 mg by mouth daily.  04/17/17  Yes [provider]  TRULICITY 1.5 AS/5.0NL SOPN INJECT 1 SYRINGE UNDER THE SKIN WEEKLY 04/17/17  Yes Elayne Snare, MD  BAYER MICROLET LANCETS lancets Use as instructed to check blood sugar 2 times per day dx code E11.9 10/23/15   Elayne Snare, MD  CONTOUR NEXT TEST test strip USE TWICE DAILY AS DIRECTED TO TEST BLOOD SUGAR 03/03/17   Elayne Snare, MD  glucose blood (BAYER CONTOUR NEXT TEST) test strip USE AS DIRECTED TO TEST BLOOD SUGAR TWICE DAILY 02/24/16   Elayne Snare, MD    Family History Family History  Problem Relation Age of Onset  . Arthritis Mother   . Alcohol abuse Mother   . Arthritis Father     Social History Social History  Substance Use Topics  . Smoking status: Never Smoker  . Smokeless tobacco: Never Used  . Alcohol use No     Allergies   Patient has no known allergies.   Review of Systems Review of Systems  Cardiovascular: Positive for chest pain.  Neurological: Positive for weakness.  All other systems reviewed and are negative.    Physical Exam Updated Vital Signs BP (!) 150/62 (BP Location: Left Arm)   Pulse 67   Temp 99.3 F (37.4 C) (Oral)   Resp 20   Ht 5\' 10"  (1.778 m)   Wt (!) 161.9 kg (357 lb)   SpO2 93%   BMI 51.22 kg/m   Physical Exam  Constitutional: He is oriented to person, place, and time. He appears well-developed and well-nourished.  obese  HENT:  Head: Normocephalic and atraumatic.  Cardiovascular: Normal rate and regular rhythm.   No murmur  heard. Pulmonary/Chest: Effort normal and breath sounds normal. No respiratory distress.  Abdominal: Soft. There is no tenderness. There is no rebound  and no guarding.  Musculoskeletal: He exhibits no tenderness.  Nonpitting edema to bilateral lower extremities  Neurological: He is alert and oriented to person, place, and time.  Skin: Skin is warm and dry.  Psychiatric: He has a normal mood and affect. His behavior is normal.  Nursing note and vitals reviewed.    ED Treatments / Results  Labs (all labs ordered are listed, but only abnormal results are displayed) Labs Reviewed  BASIC METABOLIC PANEL - Abnormal; Notable for the following:       Result Value   Chloride 115 (*)    Glucose, Bld 119 (*)    Creatinine, Ser 1.33 (*)    Calcium 11.2 (*)    GFR calc non Af Amer 58 (*)    All other components within normal limits  CBC - Abnormal; Notable for the following:    RBC 3.89 (*)    Hemoglobin 11.7 (*)    HCT 37.3 (*)    All other components within normal limits  D-DIMER, QUANTITATIVE (NOT AT [facility]) - Abnormal; Notable for the following:    D-Dimer, Quant 0.54 (*)    All other components within normal limits  BRAIN NATRIURETIC PEPTIDE  I-STAT TROPONIN, ED  POCT I-STAT TROPONIN I  I-STAT TROPONIN, ED  POCT I-STAT TROPONIN I    EKG  EKG Interpretation  Date/Time:  Tuesday May 10 2017 11:29:17 EDT Ventricular Rate:  60 PR Interval:    QRS Duration: 98 QT Interval:  400 QTC Calculation: 400 R Axis:   57 Text Interpretation:  Sinus rhythm Borderline prolonged PR interval Baseline wander in lead(s) II III aVF Confirmed by Quintella Reichert [phone]) on 05/10/2017 1:52:55 PM       Radiology Dg Chest 2 View  Result Date: 05/10/2017 CLINICAL DATA:  Weakness.  Chest pain and discomfort. EXAM: CHEST  2 VIEW COMPARISON:  11/17/2015 FINDINGS: Moderate cardiac enlargement. No pleural effusion or edema. Asymmetric elevation of the right hemidiaphragm identified. No superimposed  airspace consolidation or atelectasis. IMPRESSION: 1. Cardiac enlargement. 2. No acute cardiopulmonary abnormalities Electronically Signed   By: Kerby Moors M.D.   On: 05/10/2017 12:08    Procedures Procedures (including critical care time)  Medications Ordered in ED Medications - No data to display   Initial Impression / Assessment and Plan / ED Course  I have reviewed the triage vital signs and the nursing notes.  Pertinent labs & imaging results that were available during my care of the patient were reviewed by me and considered in my medical decision making (see chart for details).     Patient here for evaluation of episodic chest pain over the last week. He has no pain in the department. EKG without any acute ischemic changes. Pt low risk for DVT/PE based on age adjusted d-dimer.  He is able to ambulate the department without any difficulty. Counseled pt on unclear source of symptoms, current clinical picture is not consistent with ACS, PE, CHF, pneumonia. Discussed outpatient follow-up and return precautions.  Final Clinical Impressions(s) / ED Diagnoses   Final diagnoses:  Atypical chest pain    New Prescriptions New Prescriptions   No medications on file     Quintella Reichert, MD 05/10/17 1550

## 2017-05-23 ENCOUNTER — Other Ambulatory Visit: Payer: Self-pay | Admitting: Endocrinology

## 2017-06-02 ENCOUNTER — Ambulatory Visit: Payer: 59

## 2017-06-15 DIAGNOSIS — C61 Malignant neoplasm of prostate: Secondary | ICD-10-CM | POA: Diagnosis not present

## 2017-06-21 ENCOUNTER — Ambulatory Visit (INDEPENDENT_AMBULATORY_CARE_PROVIDER_SITE_OTHER): Payer: 59 | Admitting: Pharmacist

## 2017-06-21 VITALS — BP 144/78 | HR 51

## 2017-06-21 DIAGNOSIS — I10 Essential (primary) hypertension: Secondary | ICD-10-CM | POA: Diagnosis not present

## 2017-06-21 DIAGNOSIS — R3912 Poor urinary stream: Secondary | ICD-10-CM | POA: Diagnosis not present

## 2017-06-21 DIAGNOSIS — R310 Gross hematuria: Secondary | ICD-10-CM | POA: Diagnosis not present

## 2017-06-21 DIAGNOSIS — C61 Malignant neoplasm of prostate: Secondary | ICD-10-CM | POA: Diagnosis not present

## 2017-06-21 NOTE — Patient Instructions (Signed)
It was nice to see you today  Continue to take your carvedilol twice daily and your amlodipine once daily  Continue to work on staying active and cutting back on soda   Monitor your blood pressure at home - your goal is less than 130/80  Follow up in clinic for blood pressure check in 2 months

## 2017-06-21 NOTE — Progress Notes (Signed)
Patient ID: [name]                 DOB: [birth year]                      MRN: [id number]     HPI: Frank Cox a 56 y.o.malepatient of Frank Cox who presents for HTN follow up.PMH is significant for HTN, OSA on CPAP, DM, HLD, lap band procedure in 2011, and bipolar disorder.He cannot take ACEi/ARB or thiazides due to his lithium use. At his last HTN clinic visit, BP was not at goal and pt was switched from Toprol to carvedilol for better BP lowering.  Pt states he has been under more stress lately. His mother had a seizure recently and he and his father have been caring for her. It took a few days for him to get used to the carvedilol. He takes his second dose of carvedilol 6 hours after he takes his first dose. Based on his schedule, he states he would not be able to take the second dose any later in the day. He has checked his BP at work and the lowest reading he has seen was 122/75. He went to his PCP this morning and states his BP was 144/80 before he took his morning medications.  Current HTN meds: carvedilol 25mg  BID, amlodipine 10mg  daily Previously tried: ACEi - does not remember side effect, cannot use ARB or thiazide due to lithium use BP goal: <130/80 mmHg  Family History: He states there is no history of high blood pressure in his family and his mother and father are both still living.   Social History: He denies tobacco and states he has had only 1 beer in the last year to celebrate his birthday.   Diet: He eats most of his meals from home. He does not add salt to his food. He states he has a soda problem and drinks 2 separate 2 liters of soda per day. Had a discussion about cutting back on this and he states he will try to start limiting.   Exercise: No exercise and works a sedentary job. We had a lengthy discussion about slowly increasing his exercise. He states he knows a course through the plant at work that he could start walking. Since he is very hesitant  about exercise, but agreed that it would likely benefit his overall health. He was willing to try to walk the course at work one time once a week.   Home BP readings:   Wt Readings from Last 3 Encounters:  05/10/17 (!) 357 lb (161.9 kg)  04/25/17 (!) 375 lb (170.1 kg)  01/26/17 (!) 369 lb 6.4 oz (167.6 kg)   BP Readings from Last 3 Encounters:  05/10/17 (!) 150/62  04/25/17 122/68  04/21/17 (!) 144/72   Pulse Readings from Last 3 Encounters:  05/10/17 67  04/25/17 (!) 56  04/21/17 62    Renal function: CrCl cannot be calculated (Patient's most recent lab result is older than the maximum 21 days allowed.).  Past Medical History:  Diagnosis Date  . Anxiety   . Arthritis   . Benign essential HTN 10/01/2016  . Bipolar 1 disorder ([place])    "well controlled on Lithium"  . Bruises easily   . Contact lens/glasses fitting   . Depression   . Diabetes mellitus   . Fatigue   . Gallstones   . Generalized headaches   . Hearing loss   . Hyperlipidemia   .  Hypertension    boarderline - elevated  . Kidney stone   . Obesity   . OSA on CPAP   . Poor circulation   . Prostate cancer ([place]) 02/14/14   Gleason 4+3=7, volume 56 mL  . S/P radiation therapy 07/03/2014 through 08/28/2014                                                       Prostate 7800 cGy in 40 sessions, seminal vesicles 5600 cGy in 40 sessions                          Current Outpatient Prescriptions on File Prior to Visit  Medication Sig Dispense Refill  . amLODipine (NORVASC) 10 MG tablet Take 1 tablet (10 mg total) by mouth daily. 30 tablet 11  . b complex vitamins tablet Take 1 tablet by mouth daily.    Marland Kitchen BAYER MICROLET LANCETS lancets Use as instructed to check blood sugar 2 times per day dx code E11.9 100 each 2  . carvedilol (COREG) 25 MG tablet Take 1 tablet (25 mg total) by mouth 2 (two) times daily. 60 tablet 1  . cholecalciferol (VITAMIN D) 1000 units tablet Take 1,000 Units by mouth daily.    . citalopram  (CELEXA) 40 MG tablet Take 40 mg by mouth daily.      . clonazePAM (KLONOPIN) 1 MG tablet Take 3 mg by mouth at bedtime.     . CONTOUR NEXT TEST test strip USE TWICE DAILY AS DIRECTED TO TEST BLOOD SUGAR 100 each 3  . diphenhydrAMINE (BENADRYL) 25 MG tablet Take 50 mg by mouth every 6 (six) hours as needed for allergies.    Marland Kitchen glimepiride (AMARYL) 4 MG tablet Take 2 tablets (8 mg total) by mouth daily with breakfast. (Patient taking differently: Take 4 mg by mouth 2 (two) times daily. ) 60 tablet 0  . glucose blood (BAYER CONTOUR NEXT TEST) test strip USE AS DIRECTED TO TEST BLOOD SUGAR TWICE DAILY 50 each 3  . INVOKAMET XR 50-1000 MG TB24 TAKE 1 TABLET BY MOUTH TWICE DAILY 60 tablet 3  . INVOKAMET XR 50-1000 MG TB24 TAKE 1 TABLET BY MOUTH TWICE DAILY 60 tablet 0  . lamoTRIgine (LAMICTAL) 200 MG tablet Take 400 mg by mouth at bedtime.     Marland Kitchen lithium carbonate (ESKALITH) 450 MG CR tablet Take 1,800 mg by mouth at bedtime.     Marland Kitchen lurasidone (LATUDA) 40 MG TABS Take 40 mg by mouth at bedtime.     . metoprolol (TOPROL-XL) 200 MG 24 hr tablet Take 200 mg by mouth daily.    . pioglitazone (ACTOS) 15 MG tablet TAKE 1 TABLET(15 MG) BY MOUTH DAILY 30 tablet 0  . QUEtiapine (SEROQUEL) 100 MG tablet Take 200 mg by mouth at bedtime.     . tamsulosin (FLOMAX) 0.4 MG CAPS capsule Take 0.8 mg by mouth daily.     . TRULICITY 1.5 NW/2.9FA SOPN INJECT 1 SYRINGE UNDER THE SKIN WEEKLY 2 mL 0   No current facility-administered medications on file prior to visit.     No Known Allergies   Assessment/Plan:  1. Hypertension - BP is improved but remains above goal <130/14mmHg. Pt is already optimized on max dose carvedilol and amlodipine. Discussed starting hydralazine BID (pt states he  would not be able to fit TID medications into his day; and cannot use ACEi/ARB or thtiazides due to interaction with his lithium). Pt does not want to start any new medications at this time. Will f/u with pt in 2 months prior to his  annual visit with Dr Frank Cox per patient request. Can revisit hydralazine at that time if needed. Encouraged pt to continue to limit his caffeine intake and to stay active.   Megan E. Supple, PharmD, CPP, [place] [address], [place], [place] [address] Phone: [phone]; Fax: [phone] 06/21/2017 4:04 PM

## 2017-06-29 DIAGNOSIS — K7581 Nonalcoholic steatohepatitis (NASH): Secondary | ICD-10-CM | POA: Diagnosis not present

## 2017-07-04 ENCOUNTER — Other Ambulatory Visit: Payer: Self-pay | Admitting: Cardiology

## 2017-07-04 ENCOUNTER — Other Ambulatory Visit: Payer: Self-pay | Admitting: Endocrinology

## 2017-07-19 ENCOUNTER — Other Ambulatory Visit (INDEPENDENT_AMBULATORY_CARE_PROVIDER_SITE_OTHER): Payer: 59

## 2017-07-19 DIAGNOSIS — E1165 Type 2 diabetes mellitus with hyperglycemia: Secondary | ICD-10-CM | POA: Diagnosis not present

## 2017-07-19 LAB — COMPREHENSIVE METABOLIC PANEL
ALK PHOS: 56 U/L (ref 39–117)
ALT: 17 U/L (ref 0–53)
AST: 17 U/L (ref 0–37)
Albumin: 3.8 g/dL (ref 3.5–5.2)
BILIRUBIN TOTAL: 0.6 mg/dL (ref 0.2–1.2)
BUN: 8 mg/dL (ref 6–23)
CALCIUM: 10.9 mg/dL — AB (ref 8.4–10.5)
CO2: 17 mEq/L — ABNORMAL LOW (ref 19–32)
Chloride: 110 mEq/L (ref 96–112)
Creatinine, Ser: 1.29 mg/dL (ref 0.40–1.50)
GFR: 60.87 mL/min (ref >60.00)
Glucose, Bld: 244 mg/dL — ABNORMAL HIGH (ref 70–99)
Potassium: 3.5 mEq/L (ref 3.5–5.1)
Sodium: 137 mEq/L (ref 135–145)
TOTAL PROTEIN: 6.6 g/dL (ref 6.0–8.3)

## 2017-07-19 LAB — HEMOGLOBIN A1C: HEMOGLOBIN A1C: 5.5 % (ref 4.6–6.5)

## 2017-07-21 ENCOUNTER — Other Ambulatory Visit: Payer: 59

## 2017-07-22 ENCOUNTER — Ambulatory Visit: Payer: 59 | Admitting: Endocrinology

## 2017-07-22 ENCOUNTER — Encounter: Payer: Self-pay | Admitting: Endocrinology

## 2017-07-22 ENCOUNTER — Other Ambulatory Visit: Payer: Self-pay | Admitting: Endocrinology

## 2017-07-22 VITALS — BP 152/70 | HR 91 | Ht 69.75 in | Wt 365.0 lb

## 2017-07-22 DIAGNOSIS — E119 Type 2 diabetes mellitus without complications: Secondary | ICD-10-CM

## 2017-07-22 DIAGNOSIS — E782 Mixed hyperlipidemia: Secondary | ICD-10-CM

## 2017-07-22 NOTE — Progress Notes (Signed)
Patient ID: Frank Cox, male   DOB: July 14, 1961, 56 y.o.   MRN: [id number]           Reason for Appointment: Follow-up for Type 2 Diabetes  Referring physician: Moreen Fowler  History of Present Illness:          Date of diagnosis of type 2 diabetes mellitus:   2010       Background history:   At the time of diagnosis he was treated with metformin which she thinks she took for about 3 years This was stopped when he had his gastric sleeve procedure in 2013; however he lost only about 40-50 pounds with this; his blood sugars did come back to normal After a few years his blood sugars started increasing and he was started on Amaryl His A1c was ranging from 7-7.4 in 2015 and 2016 although from outside lab was 6.5 in 11/15 A1c was 9.9% on his initial endocrinology consultation  Recent history:      Non-insulin hypoglycemic drugs are:  Amaryl 2 mg qd , Trulicity 1.5 mg daily, Invokamet XR 50/1000, 1 tablets daily, Actos 15 mg daily   A1c is lower than expected for his blood sugars, now 5.5 and previously 5.7  Fructosamine also done previously correlates with his A1c  Current management, blood sugar patterns and problems identified:  FASTING blood sugars at home are ranging from 77-1 91, average about 118  Has only one reading after supper 144  Despite repeated reminders he does not like to exercise even on his days off, again has poor motivation  His weight has fluctuated and compared to last visit is down slightly  He is still trying to cut back on drinks containing sugar and snacks  No side effects from Invokamet or Trulicity  Side effects from medications have been: None  Compliance with the medical regimen: Fair  Glucose monitoring:  done 1  times a day         Glucometer: Contour Blood Glucose readings from download as above  Self-care: The diet that the patient has been following is: Variable  Usually eating at home in the evenings   Supper usually at 6  pm,  Dietician  visit, most recent: 3/17               Exercise: walking very little including at work  Weight history:   Wt Readings from Last 3 Encounters:  07/22/17 (!) 365 lb (165.6 kg)  05/10/17 (!) 357 lb (161.9 kg)  04/25/17 (!) 375 lb (170.1 kg)    Glycemic control:   Lab Results  Component Value Date   HGBA1C 5.5 07/19/2017   HGBA1C 5.7 04/21/2017   HGBA1C 5.8 01/20/2017   Lab Results  Component Value Date   MICROALBUR 7.7 (H) 11/18/2016   LDLCALC 54 04/21/2017   CREATININE 1.29 07/19/2017    Lab Results  Component Value Date   FRUCTOSAMINE 194 04/21/2017   FRUCTOSAMINE 263 02/16/2016    Other active problems: See review of systems   Lab on 07/19/2017  Component Date Value Ref Range Status  . Sodium 07/19/2017 137  135 - 145 mEq/L Final  . Potassium 07/19/2017 3.5  3.5 - 5.1 mEq/L Final  . Chloride 07/19/2017 110  96 - 112 mEq/L Final  . CO2 07/19/2017 17* 19 - 32 mEq/L Final  . Glucose, Bld 07/19/2017 244* 70 - 99 mg/dL Final  . BUN 07/19/2017 8  6 - 23 mg/dL Final  . Creatinine, Ser 07/19/2017 1.29  0.40 -  1.50 mg/dL Final  . Total Bilirubin 07/19/2017 0.6  0.2 - 1.2 mg/dL Final  . Alkaline Phosphatase 07/19/2017 56  39 - 117 U/L Final  . AST 07/19/2017 17  0 - 37 U/L Final  . ALT 07/19/2017 17  0 - 53 U/L Final  . Total Protein 07/19/2017 6.6  6.0 - 8.3 g/dL Final  . Albumin 07/19/2017 3.8  3.5 - 5.2 g/dL Final  . Calcium 07/19/2017 10.9* 8.4 - 10.5 mg/dL Final  . GFR 07/19/2017 60.87  >60.00 mL/min Final  . Hgb A1c MFr Bld 07/19/2017 5.5  4.6 - 6.5 % Final   Glycemic Control Guidelines for People with Diabetes:Non Diabetic:  <6%Goal of Therapy: <7%Additional Action Suggested:  >8%      OTHER active problems: See review of systems    Allergies as of 07/22/2017   No Known Allergies     Medication List        Accurate as of 07/22/17  1:44 PM. Always use your most recent med list.          amLODipine 10 MG tablet Commonly known as:  NORVASC Take 1  tablet (10 mg total) by mouth daily.   b complex vitamins tablet Take 1 tablet by mouth daily.   BAYER MICROLET LANCETS lancets Use as instructed to check blood sugar 2 times per day dx code E11.9   carvedilol 25 MG tablet Commonly known as:  COREG TAKE 1 TABLET(25 MG) BY MOUTH TWICE DAILY   cholecalciferol 1000 units tablet Commonly known as:  VITAMIN D Take 1,000 Units by mouth daily.   citalopram 40 MG tablet Commonly known as:  CELEXA Take 40 mg by mouth daily.   clonazePAM 1 MG tablet Commonly known as:  KLONOPIN Take 3 mg by mouth at bedtime.   diphenhydrAMINE 25 MG tablet Commonly known as:  BENADRYL Take 50 mg by mouth every 6 (six) hours as needed for allergies.   glimepiride 4 MG tablet Commonly known as:  AMARYL Take 2 tablets (8 mg total) by mouth daily with breakfast.   glucose blood test strip Commonly known as:  BAYER CONTOUR NEXT TEST USE AS DIRECTED TO TEST BLOOD SUGAR TWICE DAILY   CONTOUR NEXT TEST test strip Generic drug:  glucose blood USE TWICE DAILY AS DIRECTED TO TEST BLOOD SUGAR   INVOKAMET XR 50-1000 MG Tb24 Generic drug:  Canagliflozin-Metformin HCl ER TAKE 1 TABLET BY MOUTH TWICE DAILY   lamoTRIgine 200 MG tablet Commonly known as:  LAMICTAL Take 400 mg by mouth at bedtime.   LATUDA 40 MG Tabs tablet Generic drug:  lurasidone Take 40 mg by mouth at bedtime.   lithium carbonate 450 MG CR tablet Commonly known as:  ESKALITH Take 1,800 mg by mouth at bedtime.   metoprolol 200 MG 24 hr tablet Commonly known as:  TOPROL-XL Take 200 mg by mouth daily.   pioglitazone 15 MG tablet Commonly known as:  ACTOS TAKE 1 TABLET(15 MG) BY MOUTH DAILY   QUEtiapine 100 MG tablet Commonly known as:  SEROQUEL Take 200 mg by mouth at bedtime.   tamsulosin 0.4 MG Caps capsule Commonly known as:  FLOMAX Take 0.8 mg by mouth daily.   TRULICITY 1.5 WU/1.3KG Sopn Generic drug:  Dulaglutide INJECT 1 SYRINGE UNDER THE SKIN ONCE WEEKLY        Allergies:  No Known Allergies  Past Medical History:  Diagnosis Date  . Anxiety   . Arthritis   . Benign essential HTN 10/01/2016  . Bipolar  1 disorder ([place])    "well controlled on Lithium"  . Bruises easily   . Contact lens/glasses fitting   . Depression   . Diabetes mellitus   . Fatigue   . Gallstones   . Generalized headaches   . Hearing loss   . Hyperlipidemia   . Hypertension    boarderline - elevated  . Kidney stone   . Obesity   . OSA on CPAP   . Poor circulation   . Prostate cancer ([place]) 02/14/14   Gleason 4+3=7, volume 56 mL  . S/P radiation therapy 07/03/2014 through 08/28/2014                                                       Prostate 7800 cGy in 40 sessions, seminal vesicles 5600 cGy in 40 sessions                          Past Surgical History:  Procedure Laterality Date  . LAPAROSCOPIC GASTRIC BANDING  09/09/09  . PROSTATE BIOPSY  02/14/14   Gleason 7, volume 56 mL    Family History  Problem Relation Age of Onset  . Arthritis Mother   . Alcohol abuse Mother   . Arthritis Father     Social History:  reports that  has never smoked. he has never used smokeless tobacco. He reports that he does not drink alcohol or use drugs.    Review of Systems   RENAL dysfunction: His creatinine is consistently normal, may have improved with stopping fenofibrate He is also on lithium long-term  Lab Results  Component Value Date   CREATININE 1.29 07/19/2017   BUN 8 07/19/2017   NA 137 07/19/2017   K 3.5 07/19/2017   CL 110 07/19/2017   CO2 17 (L) 07/19/2017     HYPERCALCEMIA: Apparently this has been present since at least 2014 when his PTH level was 37 His level has been variable and now below 11, has been up to 11.4 recently but highest level has been 12.1  He is not having any  history of kidney stones or known osteopenia  Lab Results  Component Value Date   PTH 62 07/19/2016   CALCIUM 10.9 (H) 07/19/2017   CAION 1.30 09/09/2009   PHOS 2.5  07/19/2016    Hepatic steatohepatitis He was told to have early cirrhosis at [facility]    Lab Results  Component Value Date   ALT 17 07/19/2017     Lipid history:  Recently triglycerides are  below 200  He was told to stop his fenofibrate because of renal dysfunction  He was given Vascepa and he says that after a week or so he was having some urethral bleeding and he was told by urologist to stop taking Vascepa, even without this he had a minor episode again but his urologist does not want him to take [place]:    Lab Results  Component Value Date   CHOL 125 04/21/2017   HDL 34.80 (L) 04/21/2017   LDLCALC 54 04/21/2017   LDLDIRECT 101.0 01/20/2017   TRIG 179.0 (H) 04/21/2017   CHOLHDL 4 04/21/2017            Most recent foot exam: 11/2353  Diabetic complications: Appears to have peripheral neuropathy.  Has had increased  urine microalbumin.  No reported retinopathy.     Review of Systems     Physical Examination:  BP (!) 152/70   Pulse 91   Ht 5' 9.75" (1.772 m)   Wt (!) 365 lb (165.6 kg)   SpO2 96%   BMI 52.75 kg/m   ASSESSMENT:  Diabetes type 2, uncontrolled with BMI  52  See history of present illness for  discussion of current diabetes management, blood sugar patterns and problems identified  His blood sugars are well controlled with normal A1c although lower than expected for his fasting readings averaging about 120 Has only one postprandial readings Although his weight is slightly better he still relates do much better with his morbid obesity He does not listen to reminders about need for exercise and need for weight loss Again reminded of the need for checking postprandial readings  HYPERCALCEMIA: Likely to be from hyperparathyroidism, asymptomatic, long-standing He has no history of kidney stones or osteopenia Have fluctuation in calcium levels and now below 11   LIPIDS: His triglycerides will need to be rechecked again on the next  visit  PLAN:    In January will check to see if Ozempic is covered on his plan, this will likely improve his weight and postprandial readings better than Trulicity, he will call when his Trulicity supply is finished and discussed how the pen would be used, doses titration and injection technique  He will be checking some readings at bedtime also  Encouraged him to start walking at least on the days off  Low fat diet  Continue to monitor calcium regularly, consider doing a bone density    Patient Instructions  Check blood sugars on waking up  2/7  Also check blood sugars about 2 hours after a meal and do this after different meals by rotation  Recommended blood sugar levels on waking up is 90-130 and about 2 hours after meal is 130-160  Please bring your blood sugar monitor to each visit, thank you  Walk on days off   [facility] 07/22/2017, 1:44 PM   Note: This office note was prepared with Estate agent. Any transcriptional errors that result from this process are unintentional.      Total visit time for evaluation and management of multiple problems, review of blood sugar download, labs, counseling = 25 minutes

## 2017-07-22 NOTE — Patient Instructions (Addendum)
Check blood sugars on waking up  2/7  Also check blood sugars about 2 hours after a meal and do this after different meals by rotation  Recommended blood sugar levels on waking up is 90-130 and about 2 hours after meal is 130-160  Please bring your blood sugar monitor to each visit, thank you  Walk on days off  Ozempic 0.5 for 2 shots then 0.5

## 2017-07-24 ENCOUNTER — Encounter: Payer: Self-pay | Admitting: Endocrinology

## 2017-07-26 ENCOUNTER — Ambulatory Visit: Payer: 59 | Admitting: Endocrinology

## 2017-08-21 ENCOUNTER — Other Ambulatory Visit: Payer: Self-pay | Admitting: Endocrinology

## 2017-08-23 ENCOUNTER — Ambulatory Visit: Payer: 59

## 2017-08-25 ENCOUNTER — Ambulatory Visit: Payer: 59

## 2017-09-02 ENCOUNTER — Encounter: Payer: Self-pay | Admitting: Endocrinology

## 2017-09-02 DIAGNOSIS — K7581 Nonalcoholic steatohepatitis (NASH): Secondary | ICD-10-CM | POA: Diagnosis not present

## 2017-09-02 DIAGNOSIS — E782 Mixed hyperlipidemia: Secondary | ICD-10-CM | POA: Diagnosis not present

## 2017-09-02 DIAGNOSIS — Z23 Encounter for immunization: Secondary | ICD-10-CM | POA: Diagnosis not present

## 2017-09-02 DIAGNOSIS — I10 Essential (primary) hypertension: Secondary | ICD-10-CM | POA: Diagnosis not present

## 2017-09-12 ENCOUNTER — Ambulatory Visit: Payer: 59

## 2017-09-13 ENCOUNTER — Ambulatory Visit (INDEPENDENT_AMBULATORY_CARE_PROVIDER_SITE_OTHER): Payer: 59 | Admitting: Pharmacist

## 2017-09-13 VITALS — BP 116/68 | HR 46

## 2017-09-13 DIAGNOSIS — I10 Essential (primary) hypertension: Secondary | ICD-10-CM | POA: Diagnosis not present

## 2017-09-13 NOTE — Progress Notes (Signed)
Patient ID: Frank Cox                 DOB: October 07, 1960                      MRN: [id number]     HPI: DASANI CREAR a 57 y.o.malepatient of Dr.Turner who presents for HTN follow up.PMH is significant for HTN, OSA on CPAP, DM, HLD, lap band procedure in 2011, and bipolar disorder.He cannot take ACEi/ARB or thiazides due to his lithium use. At his last HTN clinic visit, BP was improved to 144/78 however pt was not amenable to starting new therapy. He preferred to focus on lifestyle improvements and presents today for follow up.  Pt has not been taking care of himself as well recently since his mother had a stroke around Christmas. He has not checked his BP at all and has not monitored his diet or become more active. He had some dizziness when his DM medications were changed, however this resolved when he was switched back to Trulicity.   Pt's HR is low in the 40s today which is abnormal for pt. Upon further questioning, he has been taking both of his carvedilol tablets at the same time - 50mg  once daily. We have previously discussed spacing apart his doses every 12 hours to optimize efficacy.  Current HTN meds:carvedilol 25mg  BID, amlodipine 10mg  daily Previously tried: ACEi - does not remember side effect, cannot use ARB or thiazide due to lithium use BP goal: <130/80 mmHg  Family History: He states there is no history of high blood pressure in his family and his mother and father are both still living.   Social History: He denies tobacco and states he has had only 1 beer in the last year to celebrate his birthday.   Diet:He eats most of his meals from home. He does not add salt to his food. He states he has a soda problem and drinks 2 separate 2 liters of soda per day. Had a discussion about cutting back on this and he states he will try to start limiting.   Exercise:No exercise and works a sedentary job. We had a lengthy discussion about slowly increasing his exercise. He  states he knows a course through the plant at work that he could start walking. Since he is very hesitant about exercise, but agreed that it would likely benefit his overall health. He was willing to try to walk the course at work one time once a week.   Wt Readings from Last 3 Encounters:  07/22/17 (!) 365 lb (165.6 kg)  05/10/17 (!) 357 lb (161.9 kg)  04/25/17 (!) 375 lb (170.1 kg)   BP Readings from Last 3 Encounters:  07/22/17 (!) 152/70  06/21/17 (!) 144/78  05/10/17 (!) 150/62   Pulse Readings from Last 3 Encounters:  07/22/17 91  06/21/17 (!) 51  05/10/17 67    Renal function: CrCl cannot be calculated (Patient's most recent lab result is older than the maximum 21 days allowed.).  Past Medical History:  Diagnosis Date  . Anxiety   . Arthritis   . Benign essential HTN 10/01/2016  . Bipolar 1 disorder ([place])    "well controlled on Lithium"  . Bruises easily   . Contact lens/glasses fitting   . Depression   . Diabetes mellitus   . Fatigue   . Gallstones   . Generalized headaches   . Hearing loss   . Hyperlipidemia   . Hypertension  boarderline - elevated  . Kidney stone   . Obesity   . OSA on CPAP   . Poor circulation   . Prostate cancer ([place]) 02/14/14   Gleason 4+3=7, volume 56 mL  . S/P radiation therapy 07/03/2014 through 08/28/2014                                                       Prostate 7800 cGy in 40 sessions, seminal vesicles 5600 cGy in 40 sessions                          Current Outpatient Medications on File Prior to Visit  Medication Sig Dispense Refill  . amLODipine (NORVASC) 10 MG tablet Take 1 tablet (10 mg total) by mouth daily. 30 tablet 11  . b complex vitamins tablet Take 1 tablet by mouth daily.    Marland Kitchen BAYER MICROLET LANCETS lancets Use as instructed to check blood sugar 2 times per day dx code E11.9 100 each 2  . carvedilol (COREG) 25 MG tablet TAKE 1 TABLET(25 MG) BY MOUTH TWICE DAILY 60 tablet 1  . cholecalciferol (VITAMIN D) 1000  units tablet Take 1,000 Units by mouth daily.    . citalopram (CELEXA) 40 MG tablet Take 40 mg by mouth daily.      . clonazePAM (KLONOPIN) 1 MG tablet Take 3 mg by mouth at bedtime.     . CONTOUR NEXT TEST test strip TEST BLOOD SUGAR TWICE DAILY AS DIRECTED 100 each 1  . diphenhydrAMINE (BENADRYL) 25 MG tablet Take 50 mg by mouth every 6 (six) hours as needed for allergies.    Marland Kitchen glimepiride (AMARYL) 4 MG tablet Take 2 tablets (8 mg total) by mouth daily with breakfast. (Patient taking differently: Take 4 mg by mouth 2 (two) times daily. ) 60 tablet 0  . INVOKAMET XR 50-1000 MG TB24 TAKE 1 TABLET BY MOUTH TWICE DAILY 60 tablet 2  . lamoTRIgine (LAMICTAL) 200 MG tablet Take 400 mg by mouth at bedtime.     Marland Kitchen lithium carbonate (ESKALITH) 450 MG CR tablet Take 1,800 mg by mouth at bedtime.     Marland Kitchen lurasidone (LATUDA) 40 MG TABS Take 40 mg by mouth at bedtime.     . metoprolol (TOPROL-XL) 200 MG 24 hr tablet Take 200 mg by mouth daily.    . pioglitazone (ACTOS) 15 MG tablet TAKE 1 TABLET(15 MG) BY MOUTH DAILY 30 tablet 2  . QUEtiapine (SEROQUEL) 100 MG tablet Take 200 mg by mouth at bedtime.     . tamsulosin (FLOMAX) 0.4 MG CAPS capsule Take 0.8 mg by mouth daily.     . TRULICITY 1.5 BS/4.9QP SOPN INJECT 1 SYRINGE UNDER THE SKIN ONCE WEEKLY 2 mL 0   No current facility-administered medications on file prior to visit.     No Known Allergies   Assessment/Plan:  1. Hypertension - BP much improved however pt is bradycardic since he has been taking both of his carvedilol 25mg  tablets in the morning instead of spacing them apart every 12 hours as previously instructed. Discussed need for separating dose again with pt today. Will continue amlodipine 10mg  daily. Encouraged him to become active and monitor his sodium and caffeine intake. F/u in HTN clinic in 1 month to ensure HR increases back to baseline ~60.  Denita Lun E. Mariyanna Mucha, PharmD, CPP, [place] [address]  [place], [place] [address] Phone: [phone]; Fax: [phone] 09/13/2017 10:30 AM

## 2017-09-13 NOTE — Patient Instructions (Signed)
It was nice to see you today  Make sure you split up your carvedilol dose - you should be taking 1 tablet every 12 hours instead of 2 tablets at the same time  Continue taking your other medications  Try to check your blood pressure at work once a week - your goal is less than 130/80  Try to limit your caffeine and salt intake and stay active  Follow up in 4 weeks in clinic

## 2017-10-07 ENCOUNTER — Other Ambulatory Visit: Payer: Self-pay | Admitting: Endocrinology

## 2017-10-07 ENCOUNTER — Other Ambulatory Visit: Payer: Self-pay | Admitting: Cardiology

## 2017-10-11 ENCOUNTER — Ambulatory Visit: Payer: 59

## 2017-10-20 ENCOUNTER — Ambulatory Visit: Payer: 59

## 2017-10-20 NOTE — Progress Notes (Deleted)
Patient ID: Frank Cox                 DOB: Feb 28, 1961                      MRN: [id number]     HPI: Frank Cox is a 57 y.o. male patient of Dr. Radford Pax who presents today for hypertension follow up. PMH significant for HTN, OSA on CPAP, DM, HLD, lap band procedure in 2011, and bipolar disorder.He cannot take ACEi/ARB or thiazidesdue to his lithium use. At his last visit with HTN clinic he reported that he had not been taking care of himself as his mother had a stroke around [place]. It was also discovered that he was taking carvedilol 25mg  both tablets at one time, which was causing him to be bradycardic. He was instructed to separate the dose 25mg  about Q12H.  He presents today for follow up of compliance and HR.    Current HTN meds:carvedilol 25mg  BID, amlodipine 10mg  daily Previously tried: ACEi - does not remember side effect, cannot use ARB or thiazide due to lithium use BP goal: <130/80 mmHg  Family History: He states there is no history of high blood pressure in his family and his mother and father are both still living.   Social History: He denies tobacco and states he has had only 1 beer in the last year to celebrate his birthday.   Diet:He eats most of his meals from home. He does not add salt to his food. He states he has a soda problem and drinks 2 separate 2 liters of soda per day. Had a discussion about cutting back on this and he states he will try to start limiting.   Exercise:No exercise and works a sedentary job. We had a lengthy discussion about slowly increasing his exercise. He states he knows a course through the plant at work that he could start walking. Since he is very hesitant about exercise, but agreed that it would likely benefit his overall health. He was willing to try to walk the course at work one time once a week.   Home BP readings:   Wt Readings from Last 3 Encounters:  07/22/17 (!) 365 lb (165.6 kg)  05/10/17 (!) 357 lb (161.9 kg)    04/25/17 (!) 375 lb (170.1 kg)   BP Readings from Last 3 Encounters:  09/13/17 116/68  07/22/17 (!) 152/70  06/21/17 (!) 144/78   Pulse Readings from Last 3 Encounters:  09/13/17 (!) 46  07/22/17 91  06/21/17 (!) 51    Renal function: CrCl cannot be calculated (Patient's most recent lab result is older than the maximum 21 days allowed.).  Past Medical History:  Diagnosis Date  . Anxiety   . Arthritis   . Benign essential HTN 10/01/2016  . Bipolar 1 disorder ([place])    "well controlled on Lithium"  . Bruises easily   . Contact lens/glasses fitting   . Depression   . Diabetes mellitus   . Fatigue   . Gallstones   . Generalized headaches   . Hearing loss   . Hyperlipidemia   . Hypertension    boarderline - elevated  . Kidney stone   . Obesity   . OSA on CPAP   . Poor circulation   . Prostate cancer ([place]) 02/14/14   Gleason 4+3=7, volume 56 mL  . S/P radiation therapy 07/03/2014 through 08/28/2014  Prostate 7800 cGy in 40 sessions, seminal vesicles 5600 cGy in 40 sessions                          Current Outpatient Medications on File Prior to Visit  Medication Sig Dispense Refill  . amLODipine (NORVASC) 10 MG tablet Take 1 tablet (10 mg total) by mouth daily. 30 tablet 11  . b complex vitamins tablet Take 1 tablet by mouth daily.    Marland Kitchen BAYER MICROLET LANCETS lancets Use as instructed to check blood sugar 2 times per day dx code E11.9 100 each 2  . carvedilol (COREG) 25 MG tablet TAKE 1 TABLET(25 MG) BY MOUTH TWICE DAILY 60 tablet 1  . cholecalciferol (VITAMIN D) 1000 units tablet Take 1,000 Units by mouth daily.    . citalopram (CELEXA) 40 MG tablet Take 40 mg by mouth daily.      . clonazePAM (KLONOPIN) 1 MG tablet Take 3 mg by mouth at bedtime.     . CONTOUR NEXT TEST test strip TEST BLOOD SUGAR TWICE DAILY AS DIRECTED 100 each 1  . diphenhydrAMINE (BENADRYL) 25 MG tablet Take 50 mg by mouth every 6 (six) hours as  needed for allergies.    Marland Kitchen glimepiride (AMARYL) 4 MG tablet Take 2 tablets (8 mg total) by mouth daily with breakfast. (Patient taking differently: Take 4 mg by mouth 2 (two) times daily. ) 60 tablet 0  . INVOKAMET XR 50-1000 MG TB24 TAKE 1 TABLET BY MOUTH TWICE DAILY 60 tablet 2  . lamoTRIgine (LAMICTAL) 200 MG tablet Take 400 mg by mouth at bedtime.     Marland Kitchen lithium carbonate (ESKALITH) 450 MG CR tablet Take 1,800 mg by mouth at bedtime.     Marland Kitchen lurasidone (LATUDA) 40 MG TABS Take 40 mg by mouth at bedtime.     . metoprolol (TOPROL-XL) 200 MG 24 hr tablet Take 200 mg by mouth daily.    . pioglitazone (ACTOS) 15 MG tablet TAKE 1 TABLET(15 MG) BY MOUTH DAILY 30 tablet 2  . QUEtiapine (SEROQUEL) 100 MG tablet Take 200 mg by mouth at bedtime.     . tamsulosin (FLOMAX) 0.4 MG CAPS capsule Take 0.8 mg by mouth daily.     . TRULICITY 1.5 IF/0.2DX SOPN INJECT 1 SYRINGE UNDER THE SKIN ONCE WEEKLY 2 mL 0   No current facility-administered medications on file prior to visit.     No Known Allergies  There were no vitals taken for this visit.   Assessment/Plan: Hypertension:    Thank you, Lelan Pons. Patterson Hammersmith, [facility]  10/20/2017 7:26 AM

## 2017-10-24 ENCOUNTER — Other Ambulatory Visit: Payer: 59

## 2017-10-27 ENCOUNTER — Ambulatory Visit: Payer: 59 | Admitting: Endocrinology

## 2017-10-28 ENCOUNTER — Ambulatory Visit: Payer: 59 | Admitting: Cardiology

## 2017-10-28 ENCOUNTER — Ambulatory Visit: Payer: 59 | Admitting: Endocrinology

## 2017-11-07 ENCOUNTER — Ambulatory Visit: Payer: 59 | Admitting: Cardiology

## 2017-11-17 ENCOUNTER — Ambulatory Visit: Payer: 59

## 2018-05-26 DIAGNOSIS — D122 Benign neoplasm of ascending colon: Secondary | ICD-10-CM | POA: Diagnosis not present

## 2018-05-26 DIAGNOSIS — Z1211 Encounter for screening for malignant neoplasm of colon: Secondary | ICD-10-CM | POA: Diagnosis not present

## 2018-06-20 ENCOUNTER — Other Ambulatory Visit: Payer: Self-pay

## 2018-06-20 ENCOUNTER — Emergency Department (HOSPITAL_COMMUNITY): Payer: 59

## 2018-06-20 ENCOUNTER — Emergency Department (EMERGENCY_DEPARTMENT_HOSPITAL)
Admission: EM | Admit: 2018-06-20 | Discharge: 2018-06-20 | Disposition: A | Discharge status: Home / Self Care | Payer: 59 | Source: Home / Self Care | Attending: Emergency Medicine | Admitting: Emergency Medicine

## 2018-06-20 DIAGNOSIS — Z515 Encounter for palliative care: Secondary | ICD-10-CM

## 2018-06-20 DIAGNOSIS — I10 Essential (primary) hypertension: Secondary | ICD-10-CM | POA: Insufficient documentation

## 2018-06-20 DIAGNOSIS — N179 Acute kidney failure, unspecified: Secondary | ICD-10-CM | POA: Insufficient documentation

## 2018-06-20 DIAGNOSIS — E1165 Type 2 diabetes mellitus with hyperglycemia: Secondary | ICD-10-CM | POA: Diagnosis not present

## 2018-06-20 DIAGNOSIS — Z7984 Long term (current) use of oral hypoglycemic drugs: Secondary | ICD-10-CM | POA: Insufficient documentation

## 2018-06-20 DIAGNOSIS — R404 Transient alteration of awareness: Secondary | ICD-10-CM | POA: Insufficient documentation

## 2018-06-20 DIAGNOSIS — R42 Dizziness and giddiness: Secondary | ICD-10-CM | POA: Diagnosis not present

## 2018-06-20 DIAGNOSIS — E119 Type 2 diabetes mellitus without complications: Secondary | ICD-10-CM | POA: Insufficient documentation

## 2018-06-20 DIAGNOSIS — T43591A Poisoning by other antipsychotics and neuroleptics, accidental (unintentional), initial encounter: Secondary | ICD-10-CM | POA: Diagnosis not present

## 2018-06-20 LAB — RAPID URINE DRUG SCREEN, HOSP PERFORMED
AMPHETAMINES: NOT DETECTED
BARBITURATES: NOT DETECTED
BENZODIAZEPINES: NOT DETECTED
Cocaine: NOT DETECTED
Opiates: NOT DETECTED
Tetrahydrocannabinol: NOT DETECTED

## 2018-06-20 LAB — URINALYSIS, ROUTINE W REFLEX MICROSCOPIC
BACTERIA UA: NONE SEEN
BILIRUBIN URINE: NEGATIVE
Glucose, UA: 50 mg/dL — AB
KETONES UR: NEGATIVE mg/dL
Leukocytes, UA: NEGATIVE
NITRITE: NEGATIVE
PH: 7 (ref 5.0–8.0)
Protein, ur: 100 mg/dL — AB
SPECIFIC GRAVITY, URINE: 1.004 — AB (ref 1.005–1.030)

## 2018-06-20 LAB — ETHANOL

## 2018-06-20 LAB — CBC WITH DIFFERENTIAL/PLATELET
ABS IMMATURE GRANULOCYTES: 0.09 10*3/uL — AB (ref 0.00–0.07)
BASOS PCT: 0 %
Basophils Absolute: 0 10*3/uL (ref 0.0–0.1)
EOS ABS: 0.2 10*3/uL (ref 0.0–0.5)
Eosinophils Relative: 1 %
HCT: 41 % (ref 39.0–52.0)
Hemoglobin: 12.9 g/dL — ABNORMAL LOW (ref 13.0–17.0)
Immature Granulocytes: 1 %
Lymphocytes Relative: 11 %
Lymphs Abs: 1.3 10*3/uL (ref 0.7–4.0)
MCH: 30.8 pg (ref 26.0–34.0)
MCHC: 31.5 g/dL (ref 30.0–36.0)
MCV: 97.9 fL (ref 80.0–100.0)
MONO ABS: 0.8 10*3/uL (ref 0.1–1.0)
Monocytes Relative: 6 %
NEUTROS ABS: 10.1 10*3/uL — AB (ref 1.7–7.7)
Neutrophils Relative %: 81 %
PLATELETS: 279 10*3/uL (ref 150–400)
RBC: 4.19 MIL/uL — ABNORMAL LOW (ref 4.22–5.81)
RDW: 14.2 % (ref 11.5–15.5)
WBC: 12.4 10*3/uL — ABNORMAL HIGH (ref 4.0–10.5)
nRBC: 0 % (ref 0.0–0.2)

## 2018-06-20 LAB — COMPREHENSIVE METABOLIC PANEL
ALBUMIN: 4 g/dL (ref 3.5–5.0)
ALK PHOS: 78 U/L (ref 38–126)
ALT: 14 U/L (ref 0–44)
AST: 14 U/L — AB (ref 15–41)
Anion gap: 6 (ref 5–15)
BILIRUBIN TOTAL: 1.4 mg/dL — AB (ref 0.3–1.2)
BUN: 12 mg/dL (ref 6–20)
CO2: 23 mmol/L (ref 22–32)
Calcium: 11.7 mg/dL — ABNORMAL HIGH (ref 8.9–10.3)
Chloride: 115 mmol/L — ABNORMAL HIGH (ref 98–111)
Creatinine, Ser: 1.67 mg/dL — ABNORMAL HIGH (ref 0.61–1.24)
GFR calc Af Amer: 51 mL/min — ABNORMAL LOW (ref >60)
GFR, EST NON AFRICAN AMERICAN: 44 mL/min — AB (ref >60)
GLUCOSE: 261 mg/dL — AB (ref 70–99)
Potassium: 3.8 mmol/L (ref 3.5–5.1)
Sodium: 144 mmol/L (ref 135–145)
TOTAL PROTEIN: 7.1 g/dL (ref 6.5–8.1)

## 2018-06-20 MED ORDER — SODIUM CHLORIDE 0.9 % IV BOLUS
500.0000 mL | Freq: Once | INTRAVENOUS | Status: AC
  Administered 2018-06-20: 500 mL via INTRAVENOUS

## 2018-06-20 NOTE — ED Notes (Addendum)
Pt attempted to void with staff at bedside. Pt states " Well I am not going to piss on my self" Pt informed that we had a urinal and he can pee in the urinal that this RN was holding. Pt unable to understand command. Will attempt to obtain urine momentarily

## 2018-06-20 NOTE — ED Notes (Signed)
MD made aware that the pt ambulated without difficulty. Pts father asking to speak to MD

## 2018-06-20 NOTE — ED Triage Notes (Signed)
Pt arrives via EMS from Home. Pt reports dizziness for " a few weeks" Per EMS : Pt has had 3 unwitnessed falls in the last 2 weeks. Pt lives with father at home. Pt is oriented to person and place. Pt disoriented to year and situation

## 2018-06-20 NOTE — ED Notes (Signed)
ED Provider at bedside. 

## 2018-06-20 NOTE — ED Notes (Signed)
Patient transported to CT 

## 2018-06-20 NOTE — ED Notes (Signed)
Pt ambulated without difficulty

## 2018-06-20 NOTE — ED Notes (Signed)
Bed: ET01 Expected date:  Expected time:  Means of arrival:  Comments: EMS-doesn't feel well

## 2018-06-20 NOTE — ED Provider Notes (Signed)
[facility] DEPT Provider Note   CSN: [id number] Arrival date & time: 06/20/18  7939     History   Chief Complaint Chief Complaint  Patient presents with  . Dizziness  . Multiple Falls  . Altered Mental Status    HPI Frank Cox is a 57 y.o. male.  HPI Patient is a difficult historian.  States he has had several falls from standing over the last few weeks.  Last fall was this morning.  States he became lightheaded and fell.  He denies hitting his head or neck.  Denies loss of consciousness.  States he decided he needed to be evaluated.  He denies any recent medication changes.  Drinks alcohol regularly and last drank yesterday evening.  Denies any other coingestants.  Denies pain, specifically chest, back or abdominal pain.  Denies focal weakness or numbness. Past Medical History:  Diagnosis Date  . Anxiety   . Arthritis   . Benign essential HTN 10/01/2016  . Bipolar 1 disorder ([place])    "well controlled on Lithium"  . Bruises easily   . Contact lens/glasses fitting   . Depression   . Diabetes mellitus   . Fatigue   . Gallstones   . Generalized headaches   . Hearing loss   . Hyperlipidemia   . Hypertension    boarderline - elevated  . Kidney stone   . Obesity   . OSA on CPAP   . Poor circulation   . Prostate cancer ([place]) 02/14/14   Gleason 4+3=7, volume 56 mL  . S/P radiation therapy 07/03/2014 through 08/28/2014                                                       Prostate 7800 cGy in 40 sessions, seminal vesicles 5600 cGy in 40 sessions                          Patient Active Problem List   Diagnosis Date Noted  . Benign essential HTN 10/01/2016  . Morbid obesity ([place]) 11/22/2015  . Acute lower GI bleeding 09/26/2014  . Bipolar 1 disorder ([place]) 09/26/2014  . GI bleed 09/26/2014  . Malignant neoplasm of prostate ([place]) 03/19/2014  . Cellulitis of right leg without foot 11/17/2013  . Cellulitis 11/17/2013  . Diabetes  mellitus, type II ([place]) 11/17/2013  . OSA (obstructive sleep apnea) 11/17/2013  . Depression   . Lapband APL Jan 2011 12/02/2011    Past Surgical History:  Procedure Laterality Date  . LAPAROSCOPIC GASTRIC BANDING  09/09/09  . PROSTATE BIOPSY  02/14/14   Gleason 7, volume 56 mL        Home Medications    Prior to Admission medications   Medication Sig Start Date End Date Taking? Authorizing Provider  b complex vitamins tablet Take 1 tablet by mouth daily.   Yes [provider]  BAYER MICROLET LANCETS lancets Use as instructed to check blood sugar 2 times per day dx code E11.9 10/23/15  Yes Elayne Snare, MD  carvedilol (COREG) 25 MG tablet TAKE 1 TABLET(25 MG) BY MOUTH TWICE DAILY 10/07/17  Yes Turner, Eber Hong, MD  citalopram (CELEXA) 40 MG tablet Take 40 mg by mouth daily.     Yes [provider]  clonazePAM (KLONOPIN) 1 MG tablet  Take 3 mg by mouth at bedtime.    Yes [provider]  CONTOUR NEXT TEST test strip TEST BLOOD SUGAR TWICE DAILY AS DIRECTED 08/21/17  Yes Elayne Snare, MD  glimepiride (AMARYL) 4 MG tablet Take 2 tablets (8 mg total) by mouth daily with breakfast. Patient taking differently: Take 4 mg by mouth 2 (two) times daily.  11/19/13  Yes Geradine Girt, DO  lamoTRIgine (LAMICTAL) 200 MG tablet Take 400 mg by mouth at bedtime.    Yes [provider]  lithium carbonate (ESKALITH) 450 MG CR tablet Take 1,800 mg by mouth at bedtime.    Yes [provider]  lurasidone (LATUDA) 40 MG TABS Take 40 mg by mouth at bedtime.    Yes [provider]  Melatonin 5 MG TABS Take 5 mg by mouth at bedtime as needed (sleep).   Yes [provider]  metoprolol (TOPROL-XL) 200 MG 24 hr tablet Take 200 mg by mouth daily. 04/15/17  Yes [provider]  QUEtiapine (SEROQUEL) 100 MG tablet Take 200 mg by mouth at bedtime.  06/10/14  Yes [provider]  tamsulosin (FLOMAX) 0.4 MG CAPS capsule Take 0.8 mg by mouth daily.   04/17/17  Yes [provider]  amLODipine (NORVASC) 10 MG tablet Take 1 tablet (10 mg total) by mouth daily. 02/18/17 05/19/17  Sueanne Margarita, MD  INVOKAMET XR 50-1000 MG TB24 TAKE 1 TABLET BY MOUTH TWICE DAILY Patient not taking: Reported on 06/20/2018 08/21/17   Elayne Snare, MD  pioglitazone (ACTOS) 15 MG tablet TAKE 1 TABLET(15 MG) BY MOUTH DAILY Patient not taking: Reported on 06/20/2018 08/21/17   Elayne Snare, MD  TRULICITY 1.5 FY/1.0FB SOPN INJECT 1 SYRINGE UNDER THE SKIN ONCE WEEKLY Patient not taking: Reported on 06/20/2018 10/07/17   Elayne Snare, MD    Family History Family History  Problem Relation Age of Onset  . Arthritis Mother   . Alcohol abuse Mother   . Arthritis Father     Social History Social History   Tobacco Use  . Smoking status: Never Smoker  . Smokeless tobacco: Never Used  Substance Use Topics  . Alcohol use: No  . Drug use: No     Allergies   Patient has no known allergies.   Review of Systems Review of Systems  Constitutional: Negative for chills and fever.  Eyes: Negative for visual disturbance.  Respiratory: Negative for shortness of breath.   Cardiovascular: Negative for chest pain.  Gastrointestinal: Negative for abdominal pain, diarrhea, nausea and vomiting.  Musculoskeletal: Positive for gait problem. Negative for back pain, myalgias and neck pain.  Skin: Negative for rash and wound.  Neurological: Negative for dizziness, syncope, weakness, light-headedness, numbness and headaches.  Psychiatric/Behavioral: Positive for confusion.  All other systems reviewed and are negative.    Physical Exam Updated Vital Signs BP (!) 158/73   Pulse 85   Temp 98.9 F (37.2 C) (Oral)   Resp 18   Wt (!) 149.7 kg   SpO2 99%   BMI 47.69 kg/m   Physical Exam  Constitutional: He is oriented to person, place, and time. He appears well-developed and well-nourished. No distress.  HENT:  Head: Normocephalic and atraumatic.  Mouth/Throat:  Oropharynx is clear and moist. No oropharyngeal exudate.  No facial asymmetry.  No intraoral trauma.  Eyes: Pupils are equal, round, and reactive to light. EOM are normal.  No definite nystagmus noted though patient is not compliant with extraocular muscle exam.  Neck: Normal range of  motion. Neck supple.  No posterior midline cervical tenderness to palpation.  No meningismus.  Cardiovascular: Normal rate and regular rhythm.  Pulmonary/Chest: Effort normal and breath sounds normal.  Abdominal: Soft. Bowel sounds are normal. There is no tenderness. There is no rebound and no guarding.  Musculoskeletal: Normal range of motion. He exhibits no edema or tenderness.  No midline thoracic lumbar tenderness.  No lower extremity swelling, asymmetry or tenderness.  Distal pulses intact.  Neurological: He is alert and oriented to person, place, and time.  Patient is intermittently compliant with exam.  Appears to move all extremities without focal deficit.  Sensation to light touch is intact.  Skin: Skin is warm and dry. No rash noted. He is not diaphoretic. No erythema.  Nursing note and vitals reviewed.    ED Treatments / Results  Labs (all labs ordered are listed, but only abnormal results are displayed) Labs Reviewed  CBC WITH DIFFERENTIAL/PLATELET - Abnormal; Notable for the following components:      Result Value   WBC 12.4 (*)    RBC 4.19 (*)    Hemoglobin 12.9 (*)    Neutro Abs 10.1 (*)    Abs Immature Granulocytes 0.09 (*)    All other components within normal limits  COMPREHENSIVE METABOLIC PANEL - Abnormal; Notable for the following components:   Chloride 115 (*)    Glucose, Bld 261 (*)    Creatinine, Ser 1.67 (*)    Calcium 11.7 (*)    AST 14 (*)    Total Bilirubin 1.4 (*)    GFR calc non Af Amer 44 (*)    GFR calc Af Amer 51 (*)    All other components within normal limits  URINALYSIS, ROUTINE W REFLEX MICROSCOPIC - Abnormal; Notable for the following components:   Color,  Urine STRAW (*)    Specific Gravity, Urine 1.004 (*)    Glucose, UA 50 (*)    Hgb urine dipstick SMALL (*)    Protein, ur 100 (*)    All other components within normal limits  ETHANOL  RAPID URINE DRUG SCREEN, HOSP PERFORMED    EKG None  Radiology Ct Head Wo Contrast  Result Date: 06/20/2018 CLINICAL DATA:  dizziness for " a few weeks" Per EMS : Pt has had 3 unwitnessed falls in the last 2 weeks. Pt lives with father at home. Pt is oriented to person and place. Pt disoriented to year and situation "Pt confused EXAM: CT HEAD WITHOUT CONTRAST TECHNIQUE: Contiguous axial images were obtained from the base of the skull through the vertex without intravenous contrast. COMPARISON:  None. FINDINGS: Brain: No evidence of acute infarction, hemorrhage, hydrocephalus, extra-axial collection or mass lesion/mass effect. Vascular: No hyperdense vessel or unexpected calcification. Skull: Normal. Negative for fracture or focal lesion. Sinuses/Orbits: No acute finding. Other: None IMPRESSION: Negative Electronically Signed   By: Lucrezia Europe M.D.   On: 06/20/2018 11:37    Procedures Procedures (including critical care time)  Medications Ordered in ED Medications  sodium chloride 0.9 % bolus 500 mL (0 mLs Intravenous Stopped 06/20/18 1255)     Initial Impression / Assessment and Plan / ED Course  I have reviewed the triage vital signs and the nursing notes.  Pertinent labs & imaging results that were available during my care of the patient were reviewed by me and considered in my medical decision making (see chart for details).     Question some degree of malingering.  Patient is willfully noncompliant with examination and giving inconsistent  history.  Father at bedside.  Appears to be at baseline mental status.  Father is mostly concerned for some behavioral outburst.  Work-up is essentially negative other than mild elevation in creatinine.  Advised to follow-up with neurology for possible early  dementia and with his primary physician to follow creatinine.   Final Clinical Impressions(s) / ED Diagnoses   Final diagnoses:  Transient alteration of awareness  AKI (acute kidney injury) [facility])    ED Discharge Orders    None       Julianne Rice, MD 06/21/18 [phone]

## 2018-06-20 NOTE — ED Notes (Signed)
Pt came out into hallway naked, Declaring that he will be going home. Pt assisted back to room and assisted back to bed.

## 2018-06-23 ENCOUNTER — Inpatient Hospital Stay (HOSPITAL_COMMUNITY): Payer: 59

## 2018-06-23 ENCOUNTER — Inpatient Hospital Stay (HOSPITAL_COMMUNITY)
Admission: EM | Admit: 2018-06-23 | Discharge: 2018-08-06 | DRG: 917 | Disposition: E | Payer: 59 | Attending: Pulmonary Disease | Admitting: Pulmonary Disease

## 2018-06-23 ENCOUNTER — Encounter (HOSPITAL_COMMUNITY): Payer: Self-pay | Admitting: Emergency Medicine

## 2018-06-23 DIAGNOSIS — Y92239 Unspecified place in hospital as the place of occurrence of the external cause: Secondary | ICD-10-CM | POA: Diagnosis not present

## 2018-06-23 DIAGNOSIS — G4733 Obstructive sleep apnea (adult) (pediatric): Secondary | ICD-10-CM | POA: Diagnosis present

## 2018-06-23 DIAGNOSIS — D7589 Other specified diseases of blood and blood-forming organs: Secondary | ICD-10-CM | POA: Diagnosis not present

## 2018-06-23 DIAGNOSIS — J9602 Acute respiratory failure with hypercapnia: Secondary | ICD-10-CM | POA: Diagnosis not present

## 2018-06-23 DIAGNOSIS — E860 Dehydration: Secondary | ICD-10-CM | POA: Diagnosis present

## 2018-06-23 DIAGNOSIS — Z6841 Body Mass Index (BMI) 40.0 and over, adult: Secondary | ICD-10-CM

## 2018-06-23 DIAGNOSIS — Z9884 Bariatric surgery status: Secondary | ICD-10-CM

## 2018-06-23 DIAGNOSIS — I10 Essential (primary) hypertension: Secondary | ICD-10-CM | POA: Diagnosis present

## 2018-06-23 DIAGNOSIS — Z9119 Patient's noncompliance with other medical treatment and regimen: Secondary | ICD-10-CM

## 2018-06-23 DIAGNOSIS — I350 Nonrheumatic aortic (valve) stenosis: Secondary | ICD-10-CM | POA: Diagnosis present

## 2018-06-23 DIAGNOSIS — Z781 Physical restraint status: Secondary | ICD-10-CM

## 2018-06-23 DIAGNOSIS — R011 Cardiac murmur, unspecified: Secondary | ICD-10-CM | POA: Diagnosis present

## 2018-06-23 DIAGNOSIS — I998 Other disorder of circulatory system: Secondary | ICD-10-CM | POA: Diagnosis not present

## 2018-06-23 DIAGNOSIS — R0603 Acute respiratory distress: Secondary | ICD-10-CM

## 2018-06-23 DIAGNOSIS — J9690 Respiratory failure, unspecified, unspecified whether with hypoxia or hypercapnia: Secondary | ICD-10-CM

## 2018-06-23 DIAGNOSIS — A419 Sepsis, unspecified organism: Secondary | ICD-10-CM | POA: Diagnosis not present

## 2018-06-23 DIAGNOSIS — J9601 Acute respiratory failure with hypoxia: Secondary | ICD-10-CM | POA: Diagnosis not present

## 2018-06-23 DIAGNOSIS — G9340 Encephalopathy, unspecified: Secondary | ICD-10-CM | POA: Diagnosis not present

## 2018-06-23 DIAGNOSIS — E785 Hyperlipidemia, unspecified: Secondary | ICD-10-CM | POA: Diagnosis present

## 2018-06-23 DIAGNOSIS — R42 Dizziness and giddiness: Secondary | ICD-10-CM | POA: Diagnosis not present

## 2018-06-23 DIAGNOSIS — R140 Abdominal distension (gaseous): Secondary | ICD-10-CM | POA: Diagnosis not present

## 2018-06-23 DIAGNOSIS — X58XXXA Exposure to other specified factors, initial encounter: Secondary | ICD-10-CM | POA: Diagnosis present

## 2018-06-23 DIAGNOSIS — Z66 Do not resuscitate: Secondary | ICD-10-CM | POA: Diagnosis not present

## 2018-06-23 DIAGNOSIS — Z8261 Family history of arthritis: Secondary | ICD-10-CM

## 2018-06-23 DIAGNOSIS — E876 Hypokalemia: Secondary | ICD-10-CM | POA: Diagnosis not present

## 2018-06-23 DIAGNOSIS — T43595D Adverse effect of other antipsychotics and neuroleptics, subsequent encounter: Secondary | ICD-10-CM | POA: Diagnosis not present

## 2018-06-23 DIAGNOSIS — R0602 Shortness of breath: Secondary | ICD-10-CM

## 2018-06-23 DIAGNOSIS — G92 Toxic encephalopathy: Secondary | ICD-10-CM | POA: Diagnosis present

## 2018-06-23 DIAGNOSIS — I878 Other specified disorders of veins: Secondary | ICD-10-CM | POA: Diagnosis present

## 2018-06-23 DIAGNOSIS — E878 Other disorders of electrolyte and fluid balance, not elsewhere classified: Secondary | ICD-10-CM | POA: Diagnosis not present

## 2018-06-23 DIAGNOSIS — I5030 Unspecified diastolic (congestive) heart failure: Secondary | ICD-10-CM | POA: Diagnosis not present

## 2018-06-23 DIAGNOSIS — Z9911 Dependence on respirator [ventilator] status: Secondary | ICD-10-CM | POA: Diagnosis not present

## 2018-06-23 DIAGNOSIS — Z7189 Other specified counseling: Secondary | ICD-10-CM

## 2018-06-23 DIAGNOSIS — F319 Bipolar disorder, unspecified: Secondary | ICD-10-CM | POA: Diagnosis present

## 2018-06-23 DIAGNOSIS — E871 Hypo-osmolality and hyponatremia: Secondary | ICD-10-CM | POA: Diagnosis not present

## 2018-06-23 DIAGNOSIS — G9341 Metabolic encephalopathy: Secondary | ICD-10-CM | POA: Diagnosis not present

## 2018-06-23 DIAGNOSIS — N5089 Other specified disorders of the male genital organs: Secondary | ICD-10-CM | POA: Diagnosis not present

## 2018-06-23 DIAGNOSIS — I872 Venous insufficiency (chronic) (peripheral): Secondary | ICD-10-CM | POA: Diagnosis present

## 2018-06-23 DIAGNOSIS — B372 Candidiasis of skin and nail: Secondary | ICD-10-CM | POA: Diagnosis not present

## 2018-06-23 DIAGNOSIS — E1122 Type 2 diabetes mellitus with diabetic chronic kidney disease: Secondary | ICD-10-CM | POA: Diagnosis present

## 2018-06-23 DIAGNOSIS — R404 Transient alteration of awareness: Secondary | ICD-10-CM | POA: Diagnosis not present

## 2018-06-23 DIAGNOSIS — Q826 Congenital sacral dimple: Secondary | ICD-10-CM | POA: Diagnosis not present

## 2018-06-23 DIAGNOSIS — N182 Chronic kidney disease, stage 2 (mild): Secondary | ICD-10-CM | POA: Diagnosis present

## 2018-06-23 DIAGNOSIS — R509 Fever, unspecified: Secondary | ICD-10-CM | POA: Diagnosis not present

## 2018-06-23 DIAGNOSIS — Z87442 Personal history of urinary calculi: Secondary | ICD-10-CM

## 2018-06-23 DIAGNOSIS — N390 Urinary tract infection, site not specified: Secondary | ICD-10-CM | POA: Diagnosis not present

## 2018-06-23 DIAGNOSIS — N139 Obstructive and reflux uropathy, unspecified: Secondary | ICD-10-CM | POA: Diagnosis present

## 2018-06-23 DIAGNOSIS — T56891S Toxic effect of other metals, accidental (unintentional), sequela: Secondary | ICD-10-CM | POA: Diagnosis not present

## 2018-06-23 DIAGNOSIS — Z923 Personal history of irradiation: Secondary | ICD-10-CM

## 2018-06-23 DIAGNOSIS — J9811 Atelectasis: Secondary | ICD-10-CM | POA: Diagnosis not present

## 2018-06-23 DIAGNOSIS — J9600 Acute respiratory failure, unspecified whether with hypoxia or hypercapnia: Secondary | ICD-10-CM

## 2018-06-23 DIAGNOSIS — T43591A Poisoning by other antipsychotics and neuroleptics, accidental (unintentional), initial encounter: Principal | ICD-10-CM | POA: Diagnosis present

## 2018-06-23 DIAGNOSIS — R000 Tachycardia, unspecified: Secondary | ICD-10-CM | POA: Diagnosis not present

## 2018-06-23 DIAGNOSIS — C61 Malignant neoplasm of prostate: Secondary | ICD-10-CM | POA: Diagnosis not present

## 2018-06-23 DIAGNOSIS — N179 Acute kidney failure, unspecified: Secondary | ICD-10-CM | POA: Diagnosis not present

## 2018-06-23 DIAGNOSIS — R451 Restlessness and agitation: Secondary | ICD-10-CM | POA: Diagnosis not present

## 2018-06-23 DIAGNOSIS — Z515 Encounter for palliative care: Secondary | ICD-10-CM | POA: Diagnosis not present

## 2018-06-23 DIAGNOSIS — Z01818 Encounter for other preprocedural examination: Secondary | ICD-10-CM

## 2018-06-23 DIAGNOSIS — M1990 Unspecified osteoarthritis, unspecified site: Secondary | ICD-10-CM | POA: Diagnosis present

## 2018-06-23 DIAGNOSIS — Z452 Encounter for adjustment and management of vascular access device: Secondary | ICD-10-CM

## 2018-06-23 DIAGNOSIS — D72829 Elevated white blood cell count, unspecified: Secondary | ICD-10-CM | POA: Diagnosis not present

## 2018-06-23 DIAGNOSIS — T56894A Toxic effect of other metals, undetermined, initial encounter: Secondary | ICD-10-CM | POA: Diagnosis not present

## 2018-06-23 DIAGNOSIS — Z79899 Other long term (current) drug therapy: Secondary | ICD-10-CM

## 2018-06-23 DIAGNOSIS — E1129 Type 2 diabetes mellitus with other diabetic kidney complication: Secondary | ICD-10-CM | POA: Diagnosis not present

## 2018-06-23 DIAGNOSIS — D539 Nutritional anemia, unspecified: Secondary | ICD-10-CM | POA: Diagnosis not present

## 2018-06-23 DIAGNOSIS — T56891A Toxic effect of other metals, accidental (unintentional), initial encounter: Secondary | ICD-10-CM | POA: Diagnosis not present

## 2018-06-23 DIAGNOSIS — R197 Diarrhea, unspecified: Secondary | ICD-10-CM

## 2018-06-23 DIAGNOSIS — F10239 Alcohol dependence with withdrawal, unspecified: Secondary | ICD-10-CM | POA: Diagnosis present

## 2018-06-23 DIAGNOSIS — N170 Acute kidney failure with tubular necrosis: Secondary | ICD-10-CM | POA: Diagnosis not present

## 2018-06-23 DIAGNOSIS — B3749 Other urogenital candidiasis: Secondary | ICD-10-CM | POA: Diagnosis not present

## 2018-06-23 DIAGNOSIS — Z811 Family history of alcohol abuse and dependence: Secondary | ICD-10-CM

## 2018-06-23 DIAGNOSIS — R0902 Hypoxemia: Secondary | ICD-10-CM

## 2018-06-23 DIAGNOSIS — Z7984 Long term (current) use of oral hypoglycemic drugs: Secondary | ICD-10-CM

## 2018-06-23 DIAGNOSIS — E1165 Type 2 diabetes mellitus with hyperglycemia: Secondary | ICD-10-CM | POA: Diagnosis present

## 2018-06-23 DIAGNOSIS — R6520 Severe sepsis without septic shock: Secondary | ICD-10-CM | POA: Diagnosis not present

## 2018-06-23 DIAGNOSIS — R234 Changes in skin texture: Secondary | ICD-10-CM | POA: Diagnosis present

## 2018-06-23 DIAGNOSIS — R208 Other disturbances of skin sensation: Secondary | ICD-10-CM | POA: Diagnosis not present

## 2018-06-23 DIAGNOSIS — N320 Bladder-neck obstruction: Secondary | ICD-10-CM | POA: Diagnosis present

## 2018-06-23 DIAGNOSIS — E874 Mixed disorder of acid-base balance: Secondary | ICD-10-CM | POA: Diagnosis not present

## 2018-06-23 DIAGNOSIS — Z9289 Personal history of other medical treatment: Secondary | ICD-10-CM

## 2018-06-23 DIAGNOSIS — K8020 Calculus of gallbladder without cholecystitis without obstruction: Secondary | ICD-10-CM | POA: Diagnosis not present

## 2018-06-23 DIAGNOSIS — R0989 Other specified symptoms and signs involving the circulatory and respiratory systems: Secondary | ICD-10-CM | POA: Diagnosis not present

## 2018-06-23 DIAGNOSIS — E870 Hyperosmolality and hypernatremia: Secondary | ICD-10-CM | POA: Diagnosis not present

## 2018-06-23 DIAGNOSIS — Z960 Presence of urogenital implants: Secondary | ICD-10-CM | POA: Diagnosis not present

## 2018-06-23 DIAGNOSIS — N251 Nephrogenic diabetes insipidus: Secondary | ICD-10-CM | POA: Diagnosis present

## 2018-06-23 DIAGNOSIS — B952 Enterococcus as the cause of diseases classified elsewhere: Secondary | ICD-10-CM | POA: Diagnosis present

## 2018-06-23 DIAGNOSIS — Z8546 Personal history of malignant neoplasm of prostate: Secondary | ICD-10-CM

## 2018-06-23 DIAGNOSIS — Z79890 Hormone replacement therapy (postmenopausal): Secondary | ICD-10-CM

## 2018-06-23 DIAGNOSIS — Z4659 Encounter for fitting and adjustment of other gastrointestinal appliance and device: Secondary | ICD-10-CM

## 2018-06-23 DIAGNOSIS — Z4682 Encounter for fitting and adjustment of non-vascular catheter: Secondary | ICD-10-CM | POA: Diagnosis not present

## 2018-06-23 DIAGNOSIS — T1491XA Suicide attempt, initial encounter: Secondary | ICD-10-CM | POA: Diagnosis not present

## 2018-06-23 DIAGNOSIS — I1310 Hypertensive heart and chronic kidney disease without heart failure, with stage 1 through stage 4 chronic kidney disease, or unspecified chronic kidney disease: Secondary | ICD-10-CM | POA: Diagnosis present

## 2018-06-23 DIAGNOSIS — R579 Shock, unspecified: Secondary | ICD-10-CM

## 2018-06-23 DIAGNOSIS — Z9189 Other specified personal risk factors, not elsewhere classified: Secondary | ICD-10-CM

## 2018-06-23 DIAGNOSIS — R6521 Severe sepsis with septic shock: Secondary | ICD-10-CM | POA: Diagnosis not present

## 2018-06-23 DIAGNOSIS — R369 Urethral discharge, unspecified: Secondary | ICD-10-CM | POA: Diagnosis not present

## 2018-06-23 DIAGNOSIS — R230 Cyanosis: Secondary | ICD-10-CM | POA: Diagnosis not present

## 2018-06-23 DIAGNOSIS — N400 Enlarged prostate without lower urinary tract symptoms: Secondary | ICD-10-CM | POA: Diagnosis present

## 2018-06-23 DIAGNOSIS — E8770 Fluid overload, unspecified: Secondary | ICD-10-CM | POA: Diagnosis not present

## 2018-06-23 DIAGNOSIS — D696 Thrombocytopenia, unspecified: Secondary | ICD-10-CM | POA: Diagnosis not present

## 2018-06-23 DIAGNOSIS — R358 Other polyuria: Secondary | ICD-10-CM | POA: Diagnosis not present

## 2018-06-23 DIAGNOSIS — I742 Embolism and thrombosis of arteries of the upper extremities: Secondary | ICD-10-CM | POA: Diagnosis not present

## 2018-06-23 DIAGNOSIS — T434X5A Adverse effect of butyrophenone and thiothixene neuroleptics, initial encounter: Secondary | ICD-10-CM | POA: Diagnosis not present

## 2018-06-23 DIAGNOSIS — H9190 Unspecified hearing loss, unspecified ear: Secondary | ICD-10-CM | POA: Diagnosis present

## 2018-06-23 DIAGNOSIS — R296 Repeated falls: Secondary | ICD-10-CM | POA: Diagnosis present

## 2018-06-23 DIAGNOSIS — F419 Anxiety disorder, unspecified: Secondary | ICD-10-CM | POA: Diagnosis present

## 2018-06-23 DIAGNOSIS — R918 Other nonspecific abnormal finding of lung field: Secondary | ICD-10-CM | POA: Diagnosis not present

## 2018-06-23 DIAGNOSIS — Z0389 Encounter for observation for other suspected diseases and conditions ruled out: Secondary | ICD-10-CM | POA: Diagnosis not present

## 2018-06-23 DIAGNOSIS — D7582 Heparin induced thrombocytopenia (HIT): Secondary | ICD-10-CM | POA: Diagnosis not present

## 2018-06-23 DIAGNOSIS — T56891D Toxic effect of other metals, accidental (unintentional), subsequent encounter: Secondary | ICD-10-CM | POA: Diagnosis not present

## 2018-06-23 DIAGNOSIS — B957 Other staphylococcus as the cause of diseases classified elsewhere: Secondary | ICD-10-CM | POA: Diagnosis present

## 2018-06-23 LAB — CBC
HCT: 38.5 % — ABNORMAL LOW (ref 39.0–52.0)
HEMATOCRIT: 36.5 % — AB (ref 39.0–52.0)
HEMOGLOBIN: 11.3 g/dL — AB (ref 13.0–17.0)
Hemoglobin: 12.3 g/dL — ABNORMAL LOW (ref 13.0–17.0)
MCH: 30.8 pg (ref 26.0–34.0)
MCH: 30.3 pg (ref 26.0–34.0)
MCHC: 31.9 g/dL (ref 30.0–36.0)
MCHC: 31 g/dL (ref 30.0–36.0)
MCV: 96.3 fL (ref 80.0–100.0)
MCV: 97.9 fL (ref 80.0–100.0)
PLATELETS: 298 10*3/uL (ref 150–400)
Platelets: 284 10*3/uL (ref 150–400)
RBC: 4 MIL/uL — ABNORMAL LOW (ref 4.22–5.81)
RBC: 3.73 MIL/uL — ABNORMAL LOW (ref 4.22–5.81)
RDW: 14.1 % (ref 11.5–15.5)
RDW: 14.4 % (ref 11.5–15.5)
WBC: 9.5 10*3/uL (ref 4.0–10.5)
WBC: 9.7 10*3/uL (ref 4.0–10.5)
nRBC: 0 % (ref 0.0–0.2)
nRBC: 0 % (ref 0.0–0.2)

## 2018-06-23 LAB — HEPATIC FUNCTION PANEL
ALK PHOS: 61 U/L (ref 38–126)
ALT: 13 U/L (ref 0–44)
AST: 11 U/L — ABNORMAL LOW (ref 15–41)
Albumin: 3.1 g/dL — ABNORMAL LOW (ref 3.5–5.0)
BILIRUBIN TOTAL: 0.8 mg/dL (ref 0.3–1.2)
Total Protein: 5.9 g/dL — ABNORMAL LOW (ref 6.5–8.1)

## 2018-06-23 LAB — COMPREHENSIVE METABOLIC PANEL
ALK PHOS: 67 U/L (ref 38–126)
ALT: 16 U/L (ref 0–44)
ANION GAP: 7 (ref 5–15)
AST: 17 U/L (ref 15–41)
Albumin: 3.4 g/dL — ABNORMAL LOW (ref 3.5–5.0)
BUN: 8 mg/dL (ref 6–20)
CALCIUM: 11 mg/dL — AB (ref 8.9–10.3)
CO2: 21 mmol/L — ABNORMAL LOW (ref 22–32)
CREATININE: 1.75 mg/dL — AB (ref 0.61–1.24)
Chloride: 112 mmol/L — ABNORMAL HIGH (ref 98–111)
GFR calc non Af Amer: 41 mL/min — ABNORMAL LOW (ref >60)
GFR, EST AFRICAN AMERICAN: 48 mL/min — AB (ref >60)
GLUCOSE: 214 mg/dL — AB (ref 70–99)
Potassium: 3.1 mmol/L — ABNORMAL LOW (ref 3.5–5.1)
Sodium: 140 mmol/L (ref 135–145)
TOTAL PROTEIN: 6.5 g/dL (ref 6.5–8.1)
Total Bilirubin: 0.6 mg/dL (ref 0.3–1.2)

## 2018-06-23 LAB — MRSA PCR SCREENING: MRSA by PCR: NEGATIVE

## 2018-06-23 LAB — BASIC METABOLIC PANEL
ANION GAP: 5 (ref 5–15)
ANION GAP: 3 — AB (ref 5–15)
BUN: 7 mg/dL (ref 6–20)
BUN: 7 mg/dL (ref 6–20)
BUN: <5 mg/dL — ABNORMAL LOW (ref 6–20)
CALCIUM: 10.7 mg/dL — AB (ref 8.9–10.3)
CALCIUM: 10.6 mg/dL — AB (ref 8.9–10.3)
CALCIUM: 9.1 mg/dL (ref 8.9–10.3)
CHLORIDE: 117 mmol/L — AB (ref 98–111)
CO2: 21 mmol/L — AB (ref 22–32)
CO2: 21 mmol/L — AB (ref 22–32)
CO2: 27 mmol/L (ref 22–32)
Chloride: 124 mmol/L — ABNORMAL HIGH (ref 98–111)
Chloride: 113 mmol/L — ABNORMAL HIGH (ref 98–111)
Creatinine, Ser: 1.66 mg/dL — ABNORMAL HIGH (ref 0.61–1.24)
Creatinine, Ser: 1.65 mg/dL — ABNORMAL HIGH (ref 0.61–1.24)
Creatinine, Ser: 1.2 mg/dL (ref 0.61–1.24)
GFR calc Af Amer: 51 mL/min — ABNORMAL LOW (ref >60)
GFR calc non Af Amer: 44 mL/min — ABNORMAL LOW (ref >60)
GFR calc non Af Amer: 44 mL/min — ABNORMAL LOW (ref >60)
GFR, EST AFRICAN AMERICAN: 51 mL/min — AB (ref >60)
GLUCOSE: 192 mg/dL — AB (ref 70–99)
GLUCOSE: 257 mg/dL — AB (ref 70–99)
Glucose, Bld: 204 mg/dL — ABNORMAL HIGH (ref 70–99)
POTASSIUM: 3.1 mmol/L — AB (ref 3.5–5.1)
POTASSIUM: 3.6 mmol/L (ref 3.5–5.1)
POTASSIUM: 2.9 mmol/L — AB (ref 3.5–5.1)
Sodium: 143 mmol/L (ref 135–145)
Sodium: 146 mmol/L — ABNORMAL HIGH (ref 135–145)
Sodium: 143 mmol/L (ref 135–145)

## 2018-06-23 LAB — URINALYSIS, ROUTINE W REFLEX MICROSCOPIC
Bilirubin Urine: NEGATIVE
GLUCOSE, UA: 50 mg/dL — AB
KETONES UR: NEGATIVE mg/dL
LEUKOCYTES UA: NEGATIVE
Nitrite: NEGATIVE
PROTEIN: 30 mg/dL — AB
Specific Gravity, Urine: 1.003 — ABNORMAL LOW (ref 1.005–1.030)
pH: 7 (ref 5.0–8.0)

## 2018-06-23 LAB — BLOOD GAS, ARTERIAL
Acid-base deficit: 3.2 mmol/L — ABNORMAL HIGH (ref 0.0–2.0)
BICARBONATE: 21.9 mmol/L (ref 20.0–28.0)
Drawn by: 441371
FIO2: 21
O2 Saturation: 92 %
PH ART: 7.326 — AB (ref 7.350–7.450)
Patient temperature: 98.2
pCO2 arterial: 43 mmHg (ref 32.0–48.0)
pO2, Arterial: 57.8 mmHg — ABNORMAL LOW (ref 83.0–108.0)

## 2018-06-23 LAB — LACTIC ACID, PLASMA
LACTIC ACID, VENOUS: 1.7 mmol/L (ref 0.5–1.9)
Lactic Acid, Venous: 0.9 mmol/L (ref 0.5–1.9)

## 2018-06-23 LAB — ETHANOL: Alcohol, Ethyl (B): <10 mg/dL (ref <10)

## 2018-06-23 LAB — TSH: TSH: 2.691 u[IU]/mL (ref 0.350–4.500)

## 2018-06-23 LAB — RAPID URINE DRUG SCREEN, HOSP PERFORMED
Amphetamines: NOT DETECTED
BENZODIAZEPINES: NOT DETECTED
Barbiturates: NOT DETECTED
Cocaine: NOT DETECTED
OPIATES: NOT DETECTED
Tetrahydrocannabinol: NOT DETECTED

## 2018-06-23 LAB — LITHIUM LEVEL
LITHIUM LVL: 2.37 mmol/L — AB (ref 0.60–1.20)
Lithium Lvl: 1.64 mmol/L (ref 0.60–1.20)

## 2018-06-23 LAB — CBG MONITORING, ED: GLUCOSE-CAPILLARY: 207 mg/dL — AB (ref 70–99)

## 2018-06-23 LAB — GLUCOSE, CAPILLARY
GLUCOSE-CAPILLARY: 169 mg/dL — AB (ref 70–99)
GLUCOSE-CAPILLARY: 164 mg/dL — AB (ref 70–99)
Glucose-Capillary: 173 mg/dL — ABNORMAL HIGH (ref 70–99)
Glucose-Capillary: 206 mg/dL — ABNORMAL HIGH (ref 70–99)

## 2018-06-23 LAB — ACETAMINOPHEN LEVEL: Acetaminophen (Tylenol), Serum: <10 ug/mL — ABNORMAL LOW (ref 10–30)

## 2018-06-23 LAB — SALICYLATE LEVEL

## 2018-06-23 LAB — CK: CK TOTAL: 81 U/L (ref 49–397)

## 2018-06-23 LAB — MAGNESIUM: Magnesium: 2.2 mg/dL (ref 1.7–2.4)

## 2018-06-23 MED ORDER — ALTEPLASE 2 MG IJ SOLR
2.0000 mg | Freq: Once | INTRAMUSCULAR | Status: DC | PRN
  Filled 2018-06-23: qty 2

## 2018-06-23 MED ORDER — MAGNESIUM SULFATE 2 GM/50ML IV SOLN
2.0000 g | Freq: Once | INTRAVENOUS | Status: AC
  Administered 2018-06-23: 2 g via INTRAVENOUS
  Filled 2018-06-23: qty 50

## 2018-06-23 MED ORDER — HYDRALAZINE HCL 20 MG/ML IJ SOLN: 10.0000 mg | INTRAMUSCULAR | Status: DC | PRN

## 2018-06-23 MED ORDER — LORAZEPAM 2 MG/ML IJ SOLN
2.0000 mg | INTRAMUSCULAR | Status: DC | PRN
  Administered 2018-06-24 (×2): 2 mg via INTRAVENOUS
  Filled 2018-06-23 (×2): qty 1

## 2018-06-23 MED ORDER — LACTATED RINGERS IV BOLUS
1000.0000 mL | Freq: Once | INTRAVENOUS | Status: AC
  Administered 2018-06-23: 1000 mL via INTRAVENOUS

## 2018-06-23 MED ORDER — SODIUM CHLORIDE 0.9 % IV SOLN: 100.0000 mL | INTRAVENOUS | Status: DC | PRN

## 2018-06-23 MED ORDER — HEPARIN SODIUM (PORCINE) 1000 UNIT/ML IJ SOLN
2.4000 mL | Freq: Once | INTRAMUSCULAR | Status: AC
  Administered 2018-06-23: 2400 [IU] via INTRAVENOUS
  Filled 2018-06-23: qty 3

## 2018-06-23 MED ORDER — SODIUM CHLORIDE 0.9 % IV BOLUS
1000.0000 mL | Freq: Once | INTRAVENOUS | Status: AC
  Administered 2018-06-23: 1000 mL via INTRAVENOUS

## 2018-06-23 MED ORDER — CHLORHEXIDINE GLUCONATE 0.12 % MT SOLN
15.0000 mL | Freq: Two times a day (BID) | OROMUCOSAL | Status: DC
  Administered 2018-06-23 – 2018-07-02 (×17): 15 mL via OROMUCOSAL
  Filled 2018-06-23 (×17): qty 15

## 2018-06-23 MED ORDER — SODIUM CHLORIDE 0.9 % IV SOLN
INTRAVENOUS | Status: DC
  Administered 2018-06-23: 12:00:00 via INTRAVENOUS

## 2018-06-23 MED ORDER — PENTAFLUOROPROP-TETRAFLUOROETH EX AERO: 1.0000 "application " | INHALATION_SPRAY | CUTANEOUS | Status: DC | PRN

## 2018-06-23 MED ORDER — LIDOCAINE-PRILOCAINE 2.5-2.5 % EX CREA
1.0000 "application " | TOPICAL_CREAM | CUTANEOUS | Status: DC | PRN
  Filled 2018-06-23: qty 5

## 2018-06-23 MED ORDER — POTASSIUM CHLORIDE 10 MEQ/100ML IV SOLN
10.0000 meq | Freq: Once | INTRAVENOUS | Status: AC
  Administered 2018-06-23: 10 meq via INTRAVENOUS
  Filled 2018-06-23: qty 100

## 2018-06-23 MED ORDER — INSULIN ASPART 100 UNIT/ML ~~LOC~~ SOLN
3.0000 [IU] | SUBCUTANEOUS | Status: DC
  Administered 2018-06-23: 9 [IU] via SUBCUTANEOUS
  Administered 2018-06-23 – 2018-06-24 (×2): 6 [IU] via SUBCUTANEOUS
  Administered 2018-06-24: 9 [IU] via SUBCUTANEOUS
  Administered 2018-06-24: 6 [IU] via SUBCUTANEOUS

## 2018-06-23 MED ORDER — HEPARIN SODIUM (PORCINE) 1000 UNIT/ML DIALYSIS
100.0000 [IU]/kg | INTRAMUSCULAR | Status: DC | PRN
  Filled 2018-06-23: qty 15

## 2018-06-23 MED ORDER — TAMSULOSIN HCL 0.4 MG PO CAPS
0.8000 mg | ORAL_CAPSULE | Freq: Every day | ORAL | Status: DC
  Administered 2018-06-24 – 2018-07-08 (×14): 0.8 mg via ORAL
  Administered 2018-07-10: 0.4 mg via ORAL
  Administered 2018-07-11 – 2018-07-13 (×3): 0.8 mg via ORAL
  Filled 2018-06-23 (×22): qty 2

## 2018-06-23 MED ORDER — LIDOCAINE HCL (PF) 1 % IJ SOLN
5.0000 mL | INTRAMUSCULAR | Status: DC | PRN
  Filled 2018-06-23: qty 5

## 2018-06-23 MED ORDER — NYSTATIN 100000 UNIT/GM EX POWD
Freq: Two times a day (BID) | CUTANEOUS | Status: DC
  Administered 2018-06-23 – 2018-07-16 (×48): via TOPICAL
  Filled 2018-06-23 (×2): qty 15

## 2018-06-23 MED ORDER — INSULIN GLARGINE 100 UNIT/ML ~~LOC~~ SOLN
5.0000 [IU] | Freq: Every day | SUBCUTANEOUS | Status: DC
  Administered 2018-06-24: 5 [IU] via SUBCUTANEOUS
  Filled 2018-06-23 (×2): qty 0.05

## 2018-06-23 MED ORDER — HEPARIN SODIUM (PORCINE) 5000 UNIT/ML IJ SOLN
5000.0000 [IU] | Freq: Three times a day (TID) | INTRAMUSCULAR | Status: DC
  Administered 2018-06-23 – 2018-07-06 (×37): 5000 [IU] via SUBCUTANEOUS
  Filled 2018-06-23 (×36): qty 1

## 2018-06-23 MED ORDER — HEPARIN SODIUM (PORCINE) 1000 UNIT/ML DIALYSIS
1000.0000 [IU] | INTRAMUSCULAR | Status: DC | PRN
  Filled 2018-06-23: qty 1

## 2018-06-23 MED ORDER — DEXMEDETOMIDINE HCL IN NACL 400 MCG/100ML IV SOLN
0.4000 ug/kg/h | INTRAVENOUS | Status: DC
  Administered 2018-06-23: 0.4 ug/kg/h via INTRAVENOUS
  Filled 2018-06-23: qty 100

## 2018-06-23 MED ORDER — CHLORHEXIDINE GLUCONATE CLOTH 2 % EX PADS
6.0000 | MEDICATED_PAD | Freq: Every day | CUTANEOUS | Status: DC
  Administered 2018-06-23 – 2018-06-24 (×2): 6 via TOPICAL

## 2018-06-23 MED ORDER — FINASTERIDE 5 MG PO TABS
5.0000 mg | ORAL_TABLET | Freq: Every day | ORAL | Status: DC
  Filled 2018-06-23: qty 1

## 2018-06-23 MED ORDER — ORAL CARE MOUTH RINSE
15.0000 mL | Freq: Two times a day (BID) | OROMUCOSAL | Status: DC
  Administered 2018-06-25 – 2018-06-30 (×10): 15 mL via OROMUCOSAL

## 2018-06-23 MED ORDER — FINASTERIDE 5 MG PO TABS
5.0000 mg | ORAL_TABLET | Freq: Every day | ORAL | Status: DC
  Administered 2018-06-24 – 2018-07-17 (×23): 5 mg via ORAL
  Filled 2018-06-23 (×23): qty 1

## 2018-06-23 NOTE — ED Provider Notes (Signed)
7:48 AM Assumed care from Dr. Stark Jock, please see their note for full history, physical and decision making until this point. In brief this is a 57 y.o. year old male who presented to the ED tonight with Altered Mental Status and Fall      Seen [facility] yesterday for similar symptoms and discharged his back with multiple episodes of altered mental status and falls and "falling out".  Unable to obtain much history secondary to altered mental status at this time.  No family able to be reached on the phone and none at bedside.  Examination patient is still confused and is laying cricket in the bed but asking for water.  Otherwise moving all extremities.  His labs reveal lithium level of 2.37 just twice upper limit of normal.  Suspect this likely the cause for his symptoms.  Will discuss with nephrology but I do not think this is an indication for acute dialysis. hospitalist consulted for admission, who is unsure if this related to lithium and wants a psych consult.   Nephrology recommends dialysis catheter placement secondary to comorbid cardiac history, elevated lithium and abated creatinine.  Discussed with ICU who will admit for further monitoring and management.  They asked that I discussed with poison control.  Poison control contacted and they agree with nephrology consultation. -In the setting of hyperreflexia and sodium 140 with the lithium level where it is they say it is not a absolute indication for dialysis but is more in a gray area.   -They recommend normal saline with a goal urine output of 1 to 2 cc/kg/h.   -They recommend every 6 hour BMP and lithium levels with a goal of sodium on the high side of normal and decreasing the lithium and decreasing symptoms.    -Will page ICU and update.   Poison control will follow along.  On reevaluation, patient MS continuing to improve.   Updated Dr. Lynetta Mare with ICU.   CRITICAL CARE Performed by: Merrily Pew Total critical care time: 45  minutes Critical care time was exclusive of separately billable procedures and treating other patients. Critical care was necessary to treat or prevent imminent or life-threatening deterioration. Critical care was time spent personally by me on the following activities: development of treatment plan with patient and/or surrogate as well as nursing, discussions with consultants, evaluation of patient's response to treatment, examination of patient, obtaining history from patient or surrogate, ordering and performing treatments and interventions, ordering and review of laboratory studies, ordering and review of radiographic studies, pulse oximetry and re-evaluation of patient's condition.  Labs, studies and imaging reviewed by myself and considered in medical decision making if ordered. Imaging interpreted by radiology.  Labs Reviewed  COMPREHENSIVE METABOLIC PANEL - Abnormal; Notable for the following components:      Result Value   Potassium 3.1 (*)    Chloride 112 (*)    CO2 21 (*)    Glucose, Bld 214 (*)    Creatinine, Ser 1.75 (*)    Calcium 11.0 (*)    Albumin 3.4 (*)    GFR calc non Af Amer 41 (*)    GFR calc Af Amer 48 (*)    All other components within normal limits  CBC - Abnormal; Notable for the following components:   RBC 4.00 (*)    Hemoglobin 12.3 (*)    HCT 38.5 (*)    All other components within normal limits  ACETAMINOPHEN LEVEL - Abnormal; Notable for the following components:  Acetaminophen (Tylenol), Serum <10 (*)    All other components within normal limits  LITHIUM LEVEL - Abnormal; Notable for the following components:   Lithium Lvl 2.37 (*)    All other components within normal limits  URINALYSIS, ROUTINE W REFLEX MICROSCOPIC - Abnormal; Notable for the following components:   Color, Urine STRAW (*)    Specific Gravity, Urine 1.003 (*)    Glucose, UA 50 (*)    Hgb urine dipstick SMALL (*)    Protein, ur 30 (*)    Bacteria, UA RARE (*)    All other  components within normal limits  CBG MONITORING, ED - Abnormal; Notable for the following components:   Glucose-Capillary 207 (*)    All other components within normal limits  SALICYLATE LEVEL  ETHANOL  RAPID URINE DRUG SCREEN, HOSP PERFORMED    No orders to display    No follow-ups on file.    Merrily Pew, MD 06/30/2018 [phone]

## 2018-06-23 NOTE — Consult Note (Signed)
Reason for Consult:  Li toxicity, AKI Referring Physician: ED [facility]  Frank Cox is an 57 y.o. male.  HPI: 57 yr male with bilpolar dz, Type 2 DM, HTN, obesity, OHS, ranl stones, DJD, prostate Ca, who was in ED 3 d ago with falls and some MS limitations.  Presents with similar today and has Li level 2.37, severely AMS, still falls. Cr 1.75, 3d ago 1.67, 1 yr ago 1.29.  Frank Cox cannot give Hx. Review of systems not obtained due to patient factors.   Past Medical History:  Diagnosis Date  . Anxiety   . Arthritis   . Benign essential HTN 10/01/2016  . Bipolar 1 disorder ([place])    "well controlled on Lithium"  . Bruises easily   . Contact lens/glasses fitting   . Depression   . Diabetes mellitus   . Fatigue   . Gallstones   . Generalized headaches   . Hearing loss   . Hyperlipidemia   . Hypertension    boarderline - elevated  . Kidney stone   . Obesity   . OSA on CPAP   . Poor circulation   . Prostate cancer ([place]) 02/14/14   Gleason 4+3=7, volume 56 mL  . S/P radiation therapy 07/03/2014 through 08/28/2014                                                       Prostate 7800 cGy in 40 sessions, seminal vesicles 5600 cGy in 40 sessions                          Past Surgical History:  Procedure Laterality Date  . LAPAROSCOPIC GASTRIC BANDING  09/09/09  . PROSTATE BIOPSY  02/14/14   Gleason 7, volume 56 mL    Family History  Problem Relation Age of Onset  . Arthritis Mother   . Alcohol abuse Mother   . Arthritis Father     Social History:  reports that Frank Cox has never smoked. Frank Cox has never used smokeless tobacco. Frank Cox reports that Frank Cox does not drink alcohol or use drugs.  Allergies: No Known Allergies  Medications: I have reviewed the patient's current medications. Prior to Admission:  (Not in a hospital admission) . Results for orders placed or performed during the hospital encounter of 06/11/2018 (from the past 48 hour(s))  Comprehensive metabolic panel     Status: Abnormal    Collection Time: 06/22/2018  2:40 AM  Result Value Ref Range   Sodium 140 135 - 145 mmol/L   Potassium 3.1 (L) 3.5 - 5.1 mmol/L   Chloride 112 (H) 98 - 111 mmol/L   CO2 21 (L) 22 - 32 mmol/L   Glucose, Bld 214 (H) 70 - 99 mg/dL   BUN 8 6 - 20 mg/dL   Creatinine, Ser 1.75 (H) 0.61 - 1.24 mg/dL   Calcium 11.0 (H) 8.9 - 10.3 mg/dL   Total Protein 6.5 6.5 - 8.1 g/dL   Albumin 3.4 (L) 3.5 - 5.0 g/dL   AST 17 15 - 41 U/L   ALT 16 0 - 44 U/L   Alkaline Phosphatase 67 38 - 126 U/L   Total Bilirubin 0.6 0.3 - 1.2 mg/dL   GFR calc non Af Amer 41 (L) >60 mL/min   GFR calc Af Amer 48 (L) >60  mL/min    Comment: (NOTE) The eGFR has been calculated using the CKD EPI equation. This calculation has not been validated in all clinical situations. eGFR's persistently <60 mL/min signify possible Chronic Kidney Disease.    Anion gap 7 5 - 15    Comment: Performed at [facility] [address], [place], [place] [address]  CBC     Status: Abnormal   Collection Time: 07/02/2018  2:40 AM  Result Value Ref Range   WBC 9.5 4.0 - 10.5 K/uL   RBC 4.00 (L) 4.22 - 5.81 MIL/uL   Hemoglobin 12.3 (L) 13.0 - 17.0 g/dL   HCT 38.5 (L) 39.0 - 52.0 %   MCV 96.3 80.0 - 100.0 fL   MCH 30.8 26.0 - 34.0 pg   MCHC 31.9 30.0 - 36.0 g/dL   RDW 14.1 11.5 - 15.5 %   Platelets 298 150 - 400 K/uL   nRBC 0.0 0.0 - 0.2 %    Comment: Performed at [facility], [place] [address], [place], [place] [address]  CBG monitoring, ED     Status: Abnormal   Collection Time: 06/08/2018  2:50 AM  Result Value Ref Range   Glucose-Capillary 207 (H) 70 - 99 mg/dL  Salicylate level     Status: None   Collection Time: 06/12/2018  5:10 AM  Result Value Ref Range   Salicylate Lvl <9.8 2.8 - 30.0 mg/dL    Comment: Performed at [facility], [place] [address], [place], [place] [address]  Ethanol     Status: None   Collection Time: 06/16/2018  5:10 AM  Result Value Ref Range   Alcohol, Ethyl (B) <10 <10 mg/dL    Comment:  (NOTE) Lowest detectable limit for serum alcohol is 10 mg/dL. For medical purposes only. Performed at [facility], [place] [address], [place], [place] [address]   Acetaminophen level     Status: Abnormal   Collection Time: 06/22/2018  5:10 AM  Result Value Ref Range   Acetaminophen (Tylenol), Serum <10 (L) 10 - 30 ug/mL    Comment: (NOTE) Therapeutic concentrations vary significantly. A range of 10-30 ug/mL  may be an effective concentration for many patients. However, some  are best treated at concentrations outside of this range. Acetaminophen concentrations >150 ug/mL at 4 hours after ingestion  and >50 ug/mL at 12 hours after ingestion are often associated with  toxic reactions. Performed at [facility], [place] [address], [place], [place] [address]   Lithium level     Status: Abnormal   Collection Time: 06/07/2018  5:10 AM  Result Value Ref Range   Lithium Lvl 2.37 (HH) 0.60 - 1.20 mmol/L    Comment: CRITICAL RESULT CALLED TO, READ BACK BY AND VERIFIED WITH:  B BALIFF RN [phone] 06/21/2018 BY A BENNETT Performed at [facility], [place] [address] [place], [place] [address]   Urinalysis, Routine w reflex microscopic     Status: Abnormal   Collection Time: 06/26/2018  5:49 AM  Result Value Ref Range   Color, Urine STRAW (A) YELLOW   APPearance CLEAR CLEAR   Specific Gravity, Urine 1.003 (L) 1.005 - 1.030   pH 7.0 5.0 - 8.0   Glucose, UA 50 (A) NEGATIVE mg/dL   Hgb urine dipstick SMALL (A) NEGATIVE   Bilirubin Urine NEGATIVE NEGATIVE   Ketones, ur NEGATIVE NEGATIVE mg/dL   Protein, ur 30 (A) NEGATIVE mg/dL   Nitrite NEGATIVE NEGATIVE   Leukocytes, UA  NEGATIVE NEGATIVE   RBC / HPF 0-5 0 - 5 RBC/hpf   WBC, UA 0-5 0 - 5 WBC/hpf   Bacteria, UA RARE (A) NONE SEEN   Mucus PRESENT     Comment: Performed at [facility], [place] [address], [place], [place] [address]  Rapid urine drug screen (hospital performed)     Status: None   Collection Time: 07/02/2018  5:49 AM   Result Value Ref Range   Opiates NONE DETECTED NONE DETECTED   Cocaine NONE DETECTED NONE DETECTED   Benzodiazepines NONE DETECTED NONE DETECTED   Amphetamines NONE DETECTED NONE DETECTED   Tetrahydrocannabinol NONE DETECTED NONE DETECTED   Barbiturates NONE DETECTED NONE DETECTED    Comment: (NOTE) DRUG SCREEN FOR MEDICAL PURPOSES ONLY.  IF CONFIRMATION IS NEEDED FOR ANY PURPOSE, NOTIFY LAB WITHIN 5 DAYS. LOWEST DETECTABLE LIMITS FOR URINE DRUG SCREEN Drug Class                     Cutoff (ng/mL) Amphetamine and metabolites    1000 Barbiturate and metabolites    200 Benzodiazepine                 277 Tricyclics and metabolites     300 Opiates and metabolites        300 Cocaine and metabolites        300 THC                            50 Performed at [facility], [place] [address], [place], [place] [address]     No results found.  ROS Blood pressure (!) 159/65, pulse (!) 59, temperature 98.7 F (37.1 C), temperature source Oral, resp. rate (!) 28, SpO2 93 %. Physical Exam Physical Examination: General appearance - obese , stasis changes , unkempt Mental status - Ox1,  Eyes - pupils equal and reactive, extraocular eye movements intact, funduscopic exam normal, discs flat and sharp Mouth - whitish plaques Neck - adenopathy noted PCL Lymphatics - posterior cervical nodes Chest - wheezing noted bilat, decreased air entry noted bilat Heart - S1 and S2 normal, systolic murmur OM7/6 at apex Abdomen - obese, nontender Neurological - confused, moves all extrem, sym facies, full EOMS Extremities - 1-2+_edema, Skin - acnoid rash abdm and chest, bruises on Legs, stasis changes LE, fungal nails  Assessment/Plan: 1 Li toxic needs Hd, needs high vol fluids but to estab urine output.   AMS, AKI? (not sure of chronicity). May have been having sz with falls. Has indic for HD with MS, AKI.  Has low SG indic ? Chronic injury 2 AKI eval 3 Hypertension: not an issue right now 4. DM  needs control 5. OHS per primary 6 Obesity 7 Prostate Ca will place foley to monitor urine vol 8 DJd 9 Bipolar needs Psych involved P HD, cath insertion, U/s,   Bishop Vanderwerf 06/13/2018, 9:30 AM

## 2018-06-23 NOTE — ED Notes (Addendum)
Pt readjusted in bed, found crooked and attempting to crawl out. Pt states he did take a lot of his medicines last night "because I was out of them". Pill bottles bedside, Lithium is empty. Patient denies suicidal or homicidal ideation at this time.

## 2018-06-23 NOTE — Progress Notes (Signed)
Clinician staffed patient with Dr. Parke Poisson and Earleen Newport, NP. Dr. Parke Poisson stated patient needs medical clearance due to toxic lithium levels, renal failure, increased potassium, and increased calcium. MD stated patient can be psychiatrically re-assessed once lithium returns to a therapeutic level and is medically cleared.

## 2018-06-23 NOTE — ED Notes (Signed)
Lab to add on basic metabolic panel.

## 2018-06-23 NOTE — ED Notes (Signed)
Per MD, not inserting foley catheter at this time. Pt is continent and able to urinate on his own into urinal without assistance.

## 2018-06-23 NOTE — Progress Notes (Signed)
Post Dialysis pt has cleared in cognition. A&Ox4. Still continues to have mild thought blocking, but is clearing.   Pt oriented to hospital admission booklet, current status medically along with care that has been administered today, and equipment. Restraints released.   Educated about fluid intake, blood sugar management, diabetes management, kidney function, and home medication management.

## 2018-06-23 NOTE — Procedures (Signed)
Central Venous Catheter Insertion Procedure Note Frank Cox [id number] 01-Dec-1960  Procedure: Insertion of Central Venous Catheter Indications: Acute hemodialysis  Procedure Details Consent: Risks of procedure as well as the alternatives and risks of each were explained to the (patient/caregiver).  Consent for procedure obtained. Time Out: Verified patient identification, verified procedure, site/side was marked, verified correct patient position, special equipment/implants available, medications/allergies/relevent history reviewed, required imaging and test results available.  Performed  Maximum sterile technique was used including antiseptics, cap, gloves, gown, hand hygiene, mask and sheet. Skin prep: Chlorhexidine; local anesthetic administered A antimicrobial bonded/coated double lumen catheter was placed in the right subclavian vein using the Seldinger technique. 15cm 13Fr Trialysis catheter inserted to 14cm depth.   Evaluation Blood flow good Complications: No apparent complications Patient did tolerate procedure well. Chest X-ray ordered to verify placement.  CXR: normal.  Ultrasound post procedure showed no pneumothorax and proper position with presence of agitated saline in RV.  Geet Hosking 07/03/2018, 12:43 PM

## 2018-06-23 NOTE — ED Triage Notes (Signed)
BIB [facility] from home. Per EMS pt's father called after multiple periods of AMS since Friday. Per EMS pt's father found him on floor this morning. EMS stated multiple empty mediation bottles on scene. Hx of Bipolar & Diabetes. Medication bottles at bedside.

## 2018-06-23 NOTE — ED Provider Notes (Signed)
[name] EMERGENCY DEPARTMENT Provider Note   CSN: [id number] Arrival date & time: 06/08/2018  0230     History   Chief Complaint Chief Complaint  Patient presents with  . Altered Mental Status  . Fall    HPI Frank Cox is a 57 y.o. male.  Patient is a 57 year old male with history of bipolar disorder, anxiety, hypertension, and diabetes.  He was brought by EMS for evaluation of a fall.  Patient reports "I fell out" this evening.  He is unable to add much additional history as he appears very somnolent and uncooperative.  He was seen yesterday at [facility] with similar complaints.  He had work-up performed which showed no significant abnormality and was discharged to h  The history is provided by the patient.    Past Medical History:  Diagnosis Date  . Anxiety   . Arthritis   . Benign essential HTN 10/01/2016  . Bipolar 1 disorder ([place])    "well controlled on Lithium"  . Bruises easily   . Contact lens/glasses fitting   . Depression   . Diabetes mellitus   . Fatigue   . Gallstones   . Generalized headaches   . Hearing loss   . Hyperlipidemia   . Hypertension    boarderline - elevated  . Kidney stone   . Obesity   . OSA on CPAP   . Poor circulation   . Prostate cancer ([place]) 02/14/14   Gleason 4+3=7, volume 56 mL  . S/P radiation therapy 07/03/2014 through 08/28/2014                                                       Prostate 7800 cGy in 40 sessions, seminal vesicles 5600 cGy in 40 sessions                          Patient Active Problem List   Diagnosis Date Noted  . Benign essential HTN 10/01/2016  . Morbid obesity ([place]) 11/22/2015  . Acute lower GI bleeding 09/26/2014  . Bipolar 1 disorder ([place]) 09/26/2014  . GI bleed 09/26/2014  . Malignant neoplasm of prostate ([place]) 03/19/2014  . Cellulitis of right leg without foot 11/17/2013  . Cellulitis 11/17/2013  . Diabetes mellitus, type II ([place]) 11/17/2013  . OSA  (obstructive sleep apnea) 11/17/2013  . Depression   . Lapband APL Jan 2011 12/02/2011    Past Surgical History:  Procedure Laterality Date  . LAPAROSCOPIC GASTRIC BANDING  09/09/09  . PROSTATE BIOPSY  02/14/14   Gleason 7, volume 56 mL        Home Medications    Prior to Admission medications   Medication Sig Start Date End Date Taking? Authorizing Provider  clonazePAM (KLONOPIN) 1 MG tablet Take 3 mg by mouth at bedtime.    Yes [provider]  finasteride (PROSCAR) 5 MG tablet Take 5 mg by mouth daily.   Yes [provider]  lamoTRIgine (LAMICTAL) 200 MG tablet Take 400 mg by mouth at bedtime.    Yes [provider]  lithium carbonate (ESKALITH) 450 MG CR tablet Take 1,800 mg by mouth at bedtime.    Yes [provider]  Melatonin 5 MG TABS Take 5 mg by mouth at bedtime as needed (sleep).  Yes [provider]  QUEtiapine (SEROQUEL) 100 MG tablet Take 200 mg by mouth at bedtime.  06/10/14  Yes [provider]  amLODipine (NORVASC) 10 MG tablet Take 1 tablet (10 mg total) by mouth daily. 02/18/17 05/19/17  Sueanne Margarita, MD  b complex vitamins tablet Take 1 tablet by mouth daily.    [provider]  BAYER MICROLET LANCETS lancets Use as instructed to check blood sugar 2 times per day dx code E11.9 10/23/15   Elayne Snare, MD  carvedilol (COREG) 25 MG tablet TAKE 1 TABLET(25 MG) BY MOUTH TWICE DAILY 10/07/17   Sueanne Margarita, MD  citalopram (CELEXA) 40 MG tablet Take 40 mg by mouth daily.      [provider]  CONTOUR NEXT TEST test strip TEST BLOOD SUGAR TWICE DAILY AS DIRECTED 08/21/17   Elayne Snare, MD  glimepiride (AMARYL) 4 MG tablet Take 2 tablets (8 mg total) by mouth daily with breakfast. Patient taking differently: Take 4 mg by mouth 2 (two) times daily.  11/19/13   Eulogio Bear U, DO  INVOKAMET XR 50-1000 MG TB24 TAKE 1 TABLET BY MOUTH TWICE DAILY Patient not taking: Reported on 06/20/2018 08/21/17   Elayne Snare, MD  lurasidone (LATUDA) 40 MG TABS Take 40 mg by mouth at bedtime.     [provider]  metoprolol (TOPROL-XL) 200 MG 24 hr tablet Take 200 mg by mouth daily. 04/15/17   [provider]  pioglitazone (ACTOS) 15 MG tablet TAKE 1 TABLET(15 MG) BY MOUTH DAILY Patient not taking: Reported on 06/20/2018 08/21/17   Elayne Snare, MD  tamsulosin (FLOMAX) 0.4 MG CAPS capsule Take 0.8 mg by mouth daily.  04/17/17   [provider]  TRULICITY 1.5 JS/2.8BT SOPN INJECT 1 SYRINGE UNDER THE SKIN ONCE WEEKLY Patient not taking: Reported on 06/20/2018 10/07/17   Elayne Snare, MD    Family History Family History  Problem Relation Age of Onset  . Arthritis Mother   . Alcohol abuse Mother   . Arthritis Father     Social History Social History   Tobacco Use  . Smoking status: Never Smoker  . Smokeless tobacco: Never Used  Substance Use Topics  . Alcohol use: No  . Drug use: No     Allergies   Patient has no known allergies.   Review of Systems Review of Systems  Unable to perform ROS: Mental status change     Physical Exam Updated Vital Signs BP (!) 132/91   Pulse 79   Temp 98.7 F (37.1 C) (Oral)   Resp 16   SpO2 98%   Physical Exam  Constitutional: He appears well-developed and well-nourished. No distress.  HENT:  Head: Normocephalic and atraumatic.  Mouth/Throat: Oropharynx is clear and moist.  Eyes: Pupils are equal, round, and reactive to light. EOM are normal.  Neck: Normal range of motion. Neck supple.  Cardiovascular: Normal rate and regular rhythm. Exam reveals no friction rub.  No murmur heard. Pulmonary/Chest: Effort normal and breath sounds normal. No respiratory distress. He has no wheezes. He has no rales.  Abdominal: Soft. Bowel sounds are normal. He exhibits no distension. There is no tenderness.  Musculoskeletal: Normal range of motion. He exhibits no edema.  Neurological:  Patient is somnolent, but arousable.  Speech is somewhat  slurred and difficult to comprehend when awakened.  He moves all 4 extremities and although thorough neurologic exam is difficult, he has no obvious neurologic deficits otherwise.  Skin: Skin is warm and  dry. He is not diaphoretic.  Nursing note and vitals reviewed.    ED Treatments / Results  Labs (all labs ordered are listed, but only abnormal results are displayed) Labs Reviewed  COMPREHENSIVE METABOLIC PANEL - Abnormal; Notable for the following components:      Result Value   Potassium 3.1 (*)    Chloride 112 (*)    CO2 21 (*)    Glucose, Bld 214 (*)    Creatinine, Ser 1.75 (*)    Calcium 11.0 (*)    Albumin 3.4 (*)    GFR calc non Af Amer 41 (*)    GFR calc Af Amer 48 (*)    All other components within normal limits  CBC - Abnormal; Notable for the following components:   RBC 4.00 (*)    Hemoglobin 12.3 (*)    HCT 38.5 (*)    All other components within normal limits  CBG MONITORING, ED - Abnormal; Notable for the following components:   Glucose-Capillary 207 (*)    All other components within normal limits  SALICYLATE LEVEL  ETHANOL  ACETAMINOPHEN LEVEL  LITHIUM LEVEL  URINALYSIS, ROUTINE W REFLEX MICROSCOPIC  RAPID URINE DRUG SCREEN, HOSP PERFORMED    EKG None  Radiology No results found.  Procedures Procedures (including critical care time)  Medications Ordered in ED Medications  sodium chloride 0.9 % bolus 1,000 mL (1,000 mLs Intravenous New Bag/Given 06/22/2018 0513)     Initial Impression / Assessment and Plan / ED Course  I have reviewed the triage vital signs and the nursing notes.  Pertinent labs & imaging results that were available during my care of the patient were reviewed by me and considered in my medical decision making (see chart for details).  Patient is a 57 year old male presenting with complaints of dizziness, fainting spells, and increased somnolence.  This has been worsening over the past several days.  He was seen yesterday at  [facility] long, however no cause was found and he was ultimately discharged.  His work-up thus far reveals essentially unremarkable laboratory studies.  He does have several bottles of empty pills that accompanied him to the ER.  Most of these prescriptions were filled over 2 months ago and I am uncertain as to whether he took them as directed or overdosed.  He is unable to enunciate exactly why he is here and whether he may have taken additional medication.  I have added on levels of acetaminophen, ethanol, salicylate, and lithium.  The studies are pending.  Care will be signed out to Dr. Dayna Barker at shift change.  He will obtain the results of these additional laboratory studies and determine the final disposition.  Final Clinical Impressions(s) / ED Diagnoses   Final diagnoses:  None    ED Discharge Orders    None       Veryl Speak, MD 06/12/2018 2303

## 2018-06-23 NOTE — H&P (Addendum)
NAME:  Frank Cox, MRN:  [id number], DOB:  [birth year], LOS: 0 ADMISSION DATE:  06/19/2018, CONSULTATION DATE:  06/21/2018 REFERRING MD:  Stark Jock, ED [facility], CHIEF COMPLAINT:  Alerted mental status.  Brief History   The patient is a 57 year old man who requires urgent hemodialysis for acute on chronic lithium toxicity. Has been increasingly confused, experiencing multiple falls over the last week.  Li level 2.37 (upper limit 1.2) and nephrology wishes to dialyze the patient.    Past Medical History  Bipolar affective disorder, T2DM on OHA, HTN, Prostate cancer, OSA on CPAP.  Significant Hospital Events   Admitted 10/18.  Consults: date of consult/date signed off & final recs:  Nephrology 10/18 - recommending HD Poison Control - recommending hydration, following serial Li levels.  Procedures (surgical and bedside):  None  Significant Diagnostic Tests:  CT head 10/15 for similar symptoms negative.  Micro Data:  N/A  Antimicrobials:  N/A   Subjective:  Patient less somnolent than on arrival but still unable to give much history. Denies chest pain, SOB or nausea.   Objective   Blood pressure (!) 159/65, pulse (!) 59, temperature 98.7 F (37.1 C), temperature source Oral, resp. rate (!) 28, SpO2 93 %.    FiO2 (%):  [2 %] 2 %   Intake/Output Summary (Last 24 hours) at 06/19/2018 0915 Last data filed at 06/18/2018 0843 Gross per 24 hour  Intake 1431.5 ml  Output -  Net 1431.5 ml   There were no vitals filed for this visit.  Examination: General: Morbid obesity, ill kept, smells of urine HENT: Dry mucus membranes,  No proptosis. Lungs: Shallow breathing, chest clear.  Cardiovascular: Extremities warm. S1S2 are normal with no murmurs.  Abdomen: Protuberant but soft, no tenderness. Extremities: Dry scaling skin with pre-tibial venous stasis dermatitis but no significant edema. Neuro: Awake but agitated, disoriented - believes he is at [facility] in spite of redirection. Occasional  coarse arm tremor. Able to follow commands. Moves all limbs GU: Urinating spontaneously.  Resolved Hospital Problem list   None  Assessment & Plan:  Assessment  Acute on chronic Li toxicity with acute toxic encephalopathy - remains critically ill due to risk of progressive neurological decline and seizures, and worsening renal function. Periodic agitation may interfere with care especially dialysis. Bipolar affective disorder - patient denies suicidal intent, BH is following Acute hypercarbic respiratory insufficieny on background of OSA.  May worsen if needs sedation to comply with therapy. Stage 2 CKD - recent worsening likely due to [facility] toxicity. Uncontrolled hyperglycemia from type 2 diabetes. Hypertension  Plan  Continue isotonic saline hydration. Follow electrolytes and keep Na >145. Monitor for hyperchloremia. Cardiac monitoring, monitor for seizures. Have started and titrated Precedex to ensure compliance with line placement and HD. Monitor for oversedation, loss of airway protection, worsening hypoventilation. Arrange for urgent dialysis. Hold other psychiatric medications at this time pending clearing sensorium Follow renal function during rehydration. Follow respiratory status and repeat ABG now that patient is more awake. Continue nocturnal CPAP and escalate to BiPAP if required. Start with stage I glycemic control. Aggressively escalate to IV insulin if necessary to control osmotic diuresis and further dehydration which will worsen Li toxicity. Hold oral BP medications for now while acutely ill. PRN hydralazine for SBP>180 Psychiatry to reassess once sensorium more clear.  Disposition / Summary of Today's Plan 07/03/2018   Initiate HD to clear Li.  Patient presently denies suicidality.     Diet: Clear liquids. Pain/Anxiety/Delirium protocol (if indicated):  none VAP protocol (if indicated): N/A DVT prophylaxis: UFH GI prophylaxis: N/A Hyperglycemia protocol: phase 1  hyperglycemia Mobility: bedrest while gait unstable Code Status: full. Family Communication: no family present.  Labs (personally reviewed)  CBC: Recent Labs  Lab 06/20/18 1028 06/12/2018 0240  WBC 12.4* 9.5  NEUTROABS 10.1*  --   HGB 12.9* 12.3*  HCT 41.0 38.5*  MCV 97.9 96.3  PLT 279 734    Basic Metabolic Panel: Recent Labs  Lab 06/20/18 1028 06/07/2018 0240  NA 144 140  K 3.8 3.1*  CL 115* 112*  CO2 23 21*  GLUCOSE 261* 214*  BUN 12 8  CREATININE 1.67* 1.75*  CALCIUM 11.7* 11.0*   GFR: CrCl cannot be calculated (Unknown ideal weight.). Recent Labs  Lab 06/20/18 1028 06/28/2018 0240  WBC 12.4* 9.5    Liver Function Tests: Recent Labs  Lab 06/20/18 1028 06/16/2018 0240  AST 14* 17  ALT 14 16  ALKPHOS 78 67  BILITOT 1.4* 0.6  PROT 7.1 6.5  ALBUMIN 4.0 3.4*   No results for input(s): LIPASE, AMYLASE in the last 168 hours. No results for input(s): AMMONIA in the last 168 hours.  ABG    Component Value Date/Time   PHART (LL) 09/09/2009 0935    7.119 CRITICAL RESULT CALLED TO, READ BACK BY AND VERIFIED WITH: Zerita Boers, RN AT [place] MAKHLOUF,RRT,RCP ON 09/09/09   PCO2ART (HH) 09/09/2009 0935    58.4 CRITICAL RESULT CALLED TO, READ BACK BY AND VERIFIED WITH: KIM GIGLIOTTI,RN AT 0945 BY [place] MAKHLOUF,RRT,RCP ON 09/09/09   PO2ART 70.4 (L) 09/09/2009 0935   HCO3 18.1 (L) 09/09/2009 0935   TCO2 18.0 09/09/2009 0935   ACIDBASEDEF 10.9 (H) 09/09/2009 0935   O2SAT 90.7 09/09/2009 0935     Coagulation Profile: No results for input(s): INR, PROTIME in the last 168 hours.  Cardiac Enzymes: No results for input(s): CKTOTAL, CKMB, CKMBINDEX, TROPONINI in the last 168 hours.  HbA1C: Hemoglobin A1C  Date/Time Value Ref Range Status  08/25/2015 9.9  Final   Hgb A1c MFr Bld  Date/Time Value Ref Range Status  07/19/2017 08:30 AM 5.5 4.6 - 6.5 % Final    Comment:    Glycemic Control Guidelines for People with Diabetes:Non Diabetic:  <6%Goal of Therapy:  <7%Additional Action Suggested:  >8%   04/21/2017 10:13 AM 5.7 4.6 - 6.5 % Final    Comment:    Glycemic Control Guidelines for People with Diabetes:Non Diabetic:  <6%Goal of Therapy: <7%Additional Action Suggested:  >8%     CBG: Recent Labs  Lab 06/14/2018 0250  GLUCAP 207*    Admitting History of Present Illness.   57 year old man who has known, longstanding BAD on Li and other mood stabilizers.  History limited due to altered mental status but reports falls at home. Similar presentation 10/15 at [facility] with complains of dizziness. Continues to drink regularly.  States that "he took all his Nicoletta Dress because he ran out of Lithium."  Review of Systems:   Unable to obtain reliable ROS due to confusion.  Past Medical History  He,  has a past medical history of Anxiety, Arthritis, Benign essential HTN (10/01/2016), Bipolar 1 disorder ([place]), Bruises easily, Contact lens/glasses fitting, Depression, Diabetes mellitus, Fatigue, Gallstones, Generalized headaches, Hearing loss, Hyperlipidemia, Hypertension, Kidney stone, Obesity, OSA on CPAP, Poor circulation, Prostate cancer ([place]) (02/14/14), and S/P radiation therapy (07/03/2014 through 08/28/2014                                                   ).  Surgical History    Past Surgical History:  Procedure Laterality Date  . LAPAROSCOPIC GASTRIC BANDING  09/09/09  . PROSTATE BIOPSY  02/14/14   Gleason 7, volume 56 mL     Social History   Social History   Socioeconomic History  . Marital status: Single    Spouse name: Not on file  . Number of children: Not on file  . Years of education: Not on file  . Highest education level: Not on file  Occupational History  . Not on file  Social Needs  . Financial resource strain: Not on file  . Food insecurity:    Worry: Not on file    Inability: Not on file  . Transportation needs:    Medical: Not on file    Non-medical: Not on file  Tobacco Use  . Smoking status: Never Smoker  . Smokeless tobacco:  Never Used  Substance and Sexual Activity  . Alcohol use: No  . Drug use: No  . Sexual activity: Not on file  Lifestyle  . Physical activity:    Days per week: Not on file    Minutes per session: Not on file  . Stress: Not on file  Relationships  . Social connections:    Talks on phone: Not on file    Gets together: Not on file    Attends religious service: Not on file    Active member of club or organization: Not on file    Attends meetings of clubs or organizations: Not on file    Relationship status: Not on file  . Intimate partner violence:    Fear of current or ex partner: Not on file    Emotionally abused: Not on file    Physically abused: Not on file    Forced sexual activity: Not on file  Other Topics Concern  . Not on file  Social History Narrative  . Not on file  ,  reports that he has never smoked. He has never used smokeless tobacco. He reports that he does not drink alcohol or use drugs.   Family History   His family history includes Alcohol abuse in his mother; Arthritis in his father and mother.   Allergies No Known Allergies   Home Medications  Prior to Admission medications   Medication Sig Start Date End Date Taking? Authorizing Provider  clonazePAM (KLONOPIN) 1 MG tablet Take 3 mg by mouth at bedtime.    Yes [provider]  finasteride (PROSCAR) 5 MG tablet Take 5 mg by mouth daily.   Yes [provider]  lamoTRIgine (LAMICTAL) 200 MG tablet Take 400 mg by mouth at bedtime.    Yes [provider]  lithium carbonate (ESKALITH) 450 MG CR tablet Take 1,800 mg by mouth at bedtime.    Yes [provider]  Melatonin 5 MG TABS Take 5 mg by mouth at bedtime as needed (sleep).   Yes [provider]  QUEtiapine (SEROQUEL) 100 MG tablet Take 200 mg by mouth at bedtime.  06/10/14  Yes [provider]  amLODipine (NORVASC) 10 MG tablet Take 1 tablet (10 mg total) by mouth daily. 02/18/17 05/19/17  Sueanne Margarita,  MD  b complex vitamins tablet Take 1 tablet by mouth daily.    [provider]  BAYER MICROLET LANCETS lancets Use as instructed to check blood sugar 2 times per day dx code E11.9 10/23/15   Elayne Snare, MD  carvedilol (COREG) 25 MG tablet TAKE 1 TABLET(25 MG) BY  MOUTH TWICE DAILY 10/07/17   Sueanne Margarita, MD  citalopram (CELEXA) 40 MG tablet Take 40 mg by mouth daily.      [provider]  CONTOUR NEXT TEST test strip TEST BLOOD SUGAR TWICE DAILY AS DIRECTED 08/21/17   Elayne Snare, MD  glimepiride (AMARYL) 4 MG tablet Take 2 tablets (8 mg total) by mouth daily with breakfast. Patient taking differently: Take 4 mg by mouth 2 (two) times daily.  11/19/13   Eulogio Bear U, DO  INVOKAMET XR 50-1000 MG TB24 TAKE 1 TABLET BY MOUTH TWICE DAILY Patient not taking: Reported on 06/20/2018 08/21/17   Elayne Snare, MD  lurasidone (LATUDA) 40 MG TABS Take 40 mg by mouth at bedtime.     [provider]  metoprolol (TOPROL-XL) 200 MG 24 hr tablet Take 200 mg by mouth daily. 04/15/17   [provider]  pioglitazone (ACTOS) 15 MG tablet TAKE 1 TABLET(15 MG) BY MOUTH DAILY Patient not taking: Reported on 06/20/2018 08/21/17   Elayne Snare, MD  tamsulosin (FLOMAX) 0.4 MG CAPS capsule Take 0.8 mg by mouth daily.  04/17/17   [provider]  TRULICITY 1.5 EE/1.0OF SOPN INJECT 1 SYRINGE UNDER THE SKIN ONCE WEEKLY Patient not taking: Reported on 06/20/2018 10/07/17   Elayne Snare, MD     Critical care time:  CRITICAL CARE Performed by: Kipp Brood   Total critical care time: 45 minutes  Critical care time was exclusive of separately billable procedures and treating other patients.  Critical care was necessary to treat or prevent imminent or life-threatening deterioration.  Critical care was time spent personally by me on the following activities: development of treatment plan with patient and/or surrogate as well as nursing, discussions with consultants, evaluation of  patient's response to treatment, examination of patient, obtaining history from patient or surrogate, ordering and performing treatments and interventions, ordering and review of laboratory studies, ordering and review of radiographic studies, pulse oximetry and re-evaluation of patient's condition.      Kipp Brood, MD [facility] ICU Physician [place]  Pager: [phone] Mobile: [phone] After hours: [phone].

## 2018-06-23 NOTE — BH Assessment (Signed)
Tele Assessment Note   Patient Name: Frank Cox MRN: [id number] Referring Physician: [place] Location of Patient: [facility] ED Location of Provider: [employer] [facility]  [name] is an 57 y.o. male presenting voluntarily to [facility] ED via EMS due to altered mental status. Per EMS, patient's father called EMS after multiple instances of AMS on Friday. Patient's father found patient unresponsive on the floor with multiple empty medication bottles, including an empty bottle of his Lithium. Patient accessed [facility] ED on 10/15 with AMS and multiple falls. Patient was a poor historian due to altered mental status. Patient was oriented to self and situation but not time. Patient was unable to identify the president. Patient appeared to be experiencing thought blocking. Patient acknowledged taking all of his Lithium. When asked why he stated "because I was almost out." Patient denied this was a suicide attempt. Patient denied SI/HI/AVH. Patient denies any substance use. Patient gave permission for clinician to contact his parents, whom he lives with, for collateral information. The listed phone number is [phone]. Clinician had to leave a voicemail. Per medical records, patient has a diagnosis of Bipolar I disorder and no psychiatric hospitalizations. Clinician was unable to assess mental health history further due to altered mental status. Patient was not alert or oriented during assessment. His motor skills are rigid. His speech is slow and he appears to be experiencing thought blocking. His affect is flat and mood is apathetic. He has poor insight, judgement, and impulse control. Clinician staffed patient with Dr. Parke Poisson and Earleen Newport, NP. Dr. Parke Poisson stated patient needs medical clearance due to toxic lithium levels, renal failure, increased potassium, and increased calcium. MD stated patient can be psychiatrically re-assessed once lithium returns to a therapeutic level and is medically cleared.    Diagnosis: F31.9 Bipolar I disorder, current or most recent episode depressed, unspecified (per history)  Past Medical History:  Past Medical History:  Diagnosis Date  . Anxiety   . Arthritis   . Benign essential HTN 10/01/2016  . Bipolar 1 disorder ([place])    "well controlled on Lithium"  . Bruises easily   . Contact lens/glasses fitting   . Depression   . Diabetes mellitus   . Fatigue   . Gallstones   . Generalized headaches   . Hearing loss   . Hyperlipidemia   . Hypertension    boarderline - elevated  . Kidney stone   . Obesity   . OSA on CPAP   . Poor circulation   . Prostate cancer ([place]) 02/14/14   Gleason 4+3=7, volume 56 mL  . S/P radiation therapy 07/03/2014 through 08/28/2014                                                       Prostate 7800 cGy in 40 sessions, seminal vesicles 5600 cGy in 40 sessions                          Past Surgical History:  Procedure Laterality Date  . LAPAROSCOPIC GASTRIC BANDING  09/09/09  . PROSTATE BIOPSY  02/14/14   Gleason 7, volume 56 mL    Family History:  Family History  Problem Relation Age of Onset  . Arthritis Mother   . Alcohol abuse Mother   . Arthritis Father  Social History:  reports that he has never smoked. He has never used smokeless tobacco. He reports that he does not drink alcohol or use drugs.  Additional Social History:  Alcohol / Drug Use Pain Medications: see MAR Prescriptions: see MAR Over the Counter: see MAR History of alcohol / drug use?: No history of alcohol / drug abuse Longest period of sobriety (when/how long): n/a  CIWA: CIWA-Ar BP: (!) 159/65 Pulse Rate: (!) 59 COWS:    Allergies: No Known Allergies  Home Medications:  (Not in a hospital admission)  OB/GYN Status:  No LMP for male patient.  General Assessment Data Location of Assessment: [facility] ED TTS Assessment: In system Is this a Tele or Face-to-Face Assessment?: Tele Assessment Is this an Initial Assessment or a  Re-assessment for this encounter?: Initial Assessment Patient Accompanied by:: Other(self) Language Other than English: No Living Arrangements: (home) What gender do you identify as?: Male Marital status: Single Maiden name: n/a Pregnancy Status: No Living Arrangements: Parent Can pt return to current living arrangement?: Yes Admission Status: Voluntary Is patient capable of signing voluntary admission?: Yes Referral Source: Self/Family/Friend Insurance type: Health and safety inspector Care Plan Living Arrangements: Parent     Risk to self with the past 6 months Suicidal Ideation: No Has patient been a risk to self within the past 6 months prior to admission? : No Suicidal Intent: No Has patient had any suicidal intent within the past 6 months prior to admission? : No Is patient at risk for suicide?: No Suicidal Plan?: No Has patient had any suicidal plan within the past 6 months prior to admission? : No Access to Means: No What has been your use of drugs/alcohol within the last 12 months?: (patient dnies) Previous Attempts/Gestures: No How many times?: 0 Other Self Harm Risks: none Triggers for Past Attempts: (n/a) Intentional Self Injurious Behavior: None Family Suicide History: No Recent stressful life event(s): (none reported) Persecutory voices/beliefs?: No Depression: No Depression Symptoms: (none reported) Substance abuse history and/or treatment for substance abuse?: No Suicide prevention information given to non-admitted patients: Not applicable  Risk to Others within the past 6 months Homicidal Ideation: No Does patient have any lifetime risk of violence toward others beyond the six months prior to admission? : No Thoughts of Harm to Others: No Current Homicidal Intent: No Current Homicidal Plan: No Access to Homicidal Means: No Identified Victim: n/a History of harm to others?: No Assessment of Violence: None Noted Violent Behavior Description:  n/a Does patient have access to weapons?: No Criminal Charges Pending?: No Does patient have a court date: No Is patient on probation?: No  Psychosis Hallucinations: None noted Delusions: None noted  Mental Status Report Appearance/Hygiene: Bizarre Eye Contact: Poor Motor Activity: Rigidity Speech: Soft, Slow, Incoherent Level of Consciousness: Drowsy Mood: Apathetic Affect: Flat Anxiety Level: None Thought Processes: Thought Blocking Judgement: Impaired Orientation: Person, Not oriented Obsessive Compulsive Thoughts/Behaviors: None  Cognitive Functioning Concentration: Poor Memory: Recent Intact, Remote Impaired Is patient IDD: No Insight: Poor Impulse Control: Poor Appetite: (UTA) Have you had any weight changes? : (UTA) Sleep: Unable to Assess Total Hours of Sleep: (unknown) Vegetative Symptoms: None  ADLScreening [facility] Assessment Services) Patient's cognitive ability adequate to safely complete daily activities?: No Patient able to express need for assistance with ADLs?: Yes Independently performs ADLs?: Yes (appropriate for developmental age)  Prior Inpatient Therapy Prior Inpatient Therapy: No(accessed ED due to AMS; no psych hospitalizations)  Prior Outpatient Therapy Prior Outpatient Therapy: (UTA)  ADL  Screening (condition at time of admission) Patient's cognitive ability adequate to safely complete daily activities?: No Is the patient deaf or have difficulty hearing?: No Does the patient have difficulty seeing, even when wearing glasses/contacts?: No Does the patient have difficulty concentrating, remembering, or making decisions?: No Patient able to express need for assistance with ADLs?: Yes Does the patient have difficulty dressing or bathing?: No Independently performs ADLs?: Yes (appropriate for developmental age) Does the patient have difficulty walking or climbing stairs?: No Weakness of Legs: None Weakness of Arms/Hands: None  Home Assistive  Devices/Equipment Home Assistive Devices/Equipment: None  Therapy Consults (therapy consults require a physician order) PT Evaluation Needed: No OT Evalulation Needed: No SLP Evaluation Needed: No Abuse/Neglect Assessment (Assessment to be complete while patient is alone) Abuse/Neglect Assessment Can Be Completed: Unable to assess, patient is non-responsive or altered mental status Values / Beliefs Cultural Requests During Hospitalization: None Spiritual Requests During Hospitalization: None Consults Spiritual Care Consult Needed: No Social Work Consult Needed: No Regulatory affairs officer (For Healthcare) Does Patient Have a Medical Advance Directive?: No Would patient like information on creating a medical advance directive?: No - Patient declined          Disposition: Clinician staffed patient with Dr. Parke Poisson and Earleen Newport, NP. Dr. Parke Poisson stated patient needs medical clearance due to toxic lithium levels, renal failure, increased potassium, and increased calcium. MD stated patient can be psychiatrically re-assessed once lithium returns to a therapeutic level and is medically cleared.  Disposition Initial Assessment Completed for this Encounter: Yes Patient referred to: Other (Comment)(needs medical clearance)  This service was provided via telemedicine using a 2-way, interactive audio and video technology.  Names of all persons participating in this telemedicine service and their role in this encounter. Name: Orvis Brill, [place] Role: TTS clinician  Name: Patsi Sears Role: patient  Name:  Role:  Name:  Role:     Orvis Brill 07/02/2018 9:13 AM

## 2018-06-23 NOTE — Procedures (Signed)
I was present at this session.  I have reviewed the session itself and made appropriate changes.  HD via temp cath. 400 BFR .  Goal clearance of Frank Cox 10/18/20192:15 PM

## 2018-06-24 ENCOUNTER — Other Ambulatory Visit: Payer: Self-pay

## 2018-06-24 DIAGNOSIS — N179 Acute kidney failure, unspecified: Secondary | ICD-10-CM

## 2018-06-24 DIAGNOSIS — T1491XA Suicide attempt, initial encounter: Secondary | ICD-10-CM

## 2018-06-24 DIAGNOSIS — T56891A Toxic effect of other metals, accidental (unintentional), initial encounter: Secondary | ICD-10-CM

## 2018-06-24 LAB — BASIC METABOLIC PANEL
ANION GAP: 7 (ref 5–15)
Anion gap: 4 — ABNORMAL LOW (ref 5–15)
BUN: <5 mg/dL — ABNORMAL LOW (ref 6–20)
BUN: 5 mg/dL — ABNORMAL LOW (ref 6–20)
CHLORIDE: 115 mmol/L — AB (ref 98–111)
CO2: 26 mmol/L (ref 22–32)
CO2: 23 mmol/L (ref 22–32)
Calcium: 9.8 mg/dL (ref 8.9–10.3)
Calcium: 10.1 mg/dL (ref 8.9–10.3)
Chloride: 118 mmol/L — ABNORMAL HIGH (ref 98–111)
Creatinine, Ser: 1.31 mg/dL — ABNORMAL HIGH (ref 0.61–1.24)
Creatinine, Ser: 1.56 mg/dL — ABNORMAL HIGH (ref 0.61–1.24)
GFR calc non Af Amer: 58 mL/min — ABNORMAL LOW (ref >60)
GFR calc non Af Amer: 47 mL/min — ABNORMAL LOW (ref >60)
GFR, EST AFRICAN AMERICAN: 55 mL/min — AB (ref >60)
GLUCOSE: 178 mg/dL — AB (ref 70–99)
Glucose, Bld: 241 mg/dL — ABNORMAL HIGH (ref 70–99)
POTASSIUM: 3.1 mmol/L — AB (ref 3.5–5.1)
Potassium: 3.1 mmol/L — ABNORMAL LOW (ref 3.5–5.1)
Sodium: 145 mmol/L (ref 135–145)
Sodium: 148 mmol/L — ABNORMAL HIGH (ref 135–145)

## 2018-06-24 LAB — HCV COMMENT:

## 2018-06-24 LAB — RENAL FUNCTION PANEL
ANION GAP: 4 — AB (ref 5–15)
Albumin: 3.1 g/dL — ABNORMAL LOW (ref 3.5–5.0)
BUN: <5 mg/dL — ABNORMAL LOW (ref 6–20)
CALCIUM: 9.8 mg/dL (ref 8.9–10.3)
CO2: 26 mmol/L (ref 22–32)
Chloride: 116 mmol/L — ABNORMAL HIGH (ref 98–111)
Creatinine, Ser: 1.3 mg/dL — ABNORMAL HIGH (ref 0.61–1.24)
GFR calc non Af Amer: 59 mL/min — ABNORMAL LOW (ref >60)
GLUCOSE: 177 mg/dL — AB (ref 70–99)
POTASSIUM: 3.1 mmol/L — AB (ref 3.5–5.1)
SODIUM: 146 mmol/L — AB (ref 135–145)

## 2018-06-24 LAB — HEPATITIS B SURFACE ANTIBODY,QUALITATIVE: HEP B S AB: NONREACTIVE

## 2018-06-24 LAB — GLUCOSE, CAPILLARY
GLUCOSE-CAPILLARY: 158 mg/dL — AB (ref 70–99)
GLUCOSE-CAPILLARY: 275 mg/dL — AB (ref 70–99)
GLUCOSE-CAPILLARY: 218 mg/dL — AB (ref 70–99)
GLUCOSE-CAPILLARY: 249 mg/dL — AB (ref 70–99)
Glucose-Capillary: 159 mg/dL — ABNORMAL HIGH (ref 70–99)
Glucose-Capillary: 286 mg/dL — ABNORMAL HIGH (ref 70–99)

## 2018-06-24 LAB — HEPATITIS C ANTIBODY (REFLEX): HCV Ab: <0.1 s/co ratio (ref 0.0–0.9)

## 2018-06-24 LAB — HEPATITIS B SURFACE ANTIGEN: HEP B S AG: NEGATIVE

## 2018-06-24 LAB — LITHIUM LEVEL: Lithium Lvl: 0.91 mmol/L (ref 0.60–1.20)

## 2018-06-24 MED ORDER — INSULIN GLARGINE 100 UNIT/ML ~~LOC~~ SOLN
10.0000 [IU] | Freq: Every day | SUBCUTANEOUS | Status: DC
  Administered 2018-06-25: 10 [IU] via SUBCUTANEOUS
  Filled 2018-06-24 (×3): qty 0.1

## 2018-06-24 MED ORDER — INSULIN ASPART 100 UNIT/ML ~~LOC~~ SOLN
0.0000 [IU] | Freq: Every day | SUBCUTANEOUS | Status: DC
  Administered 2018-06-24: 3 [IU] via SUBCUTANEOUS
  Administered 2018-06-25: 2 [IU] via SUBCUTANEOUS
  Administered 2018-06-26: 3 [IU] via SUBCUTANEOUS

## 2018-06-24 MED ORDER — HALOPERIDOL LACTATE 5 MG/ML IJ SOLN
5.0000 mg | Freq: Four times a day (QID) | INTRAMUSCULAR | Status: DC | PRN
  Administered 2018-06-24 – 2018-06-29 (×8): 5 mg via INTRAVENOUS
  Filled 2018-06-24 (×7): qty 1

## 2018-06-24 MED ORDER — POTASSIUM CHLORIDE 10 MEQ/50ML IV SOLN
10.0000 meq | INTRAVENOUS | Status: AC
  Administered 2018-06-24 (×4): 10 meq via INTRAVENOUS
  Filled 2018-06-24: qty 50

## 2018-06-24 MED ORDER — POTASSIUM CHLORIDE CRYS ER 20 MEQ PO TBCR
40.0000 meq | EXTENDED_RELEASE_TABLET | Freq: Once | ORAL | Status: AC
  Administered 2018-06-24: 40 meq via ORAL
  Filled 2018-06-24: qty 2

## 2018-06-24 MED ORDER — SODIUM CHLORIDE 0.45 % IV SOLN
INTRAVENOUS | Status: DC
  Administered 2018-06-24 – 2018-06-25 (×2): via INTRAVENOUS

## 2018-06-24 MED ORDER — HALOPERIDOL LACTATE 5 MG/ML IJ SOLN
INTRAMUSCULAR | Status: AC
  Administered 2018-06-24: 5 mg via INTRAVENOUS
  Filled 2018-06-24: qty 1

## 2018-06-24 MED ORDER — INSULIN ASPART 100 UNIT/ML ~~LOC~~ SOLN
0.0000 [IU] | Freq: Three times a day (TID) | SUBCUTANEOUS | Status: DC
  Administered 2018-06-24 – 2018-06-25 (×3): 7 [IU] via SUBCUTANEOUS
  Administered 2018-06-25: 11 [IU] via SUBCUTANEOUS
  Administered 2018-06-25: 15 [IU] via SUBCUTANEOUS
  Administered 2018-06-26 (×2): 20 [IU] via SUBCUTANEOUS
  Administered 2018-06-26: 7 [IU] via SUBCUTANEOUS
  Administered 2018-06-27: 20 [IU] via SUBCUTANEOUS

## 2018-06-24 MED ORDER — SODIUM PHOSPHATES 45 MMOLE/15ML IV SOLN
30.0000 mmol | Freq: Once | INTRAVENOUS | Status: AC
  Administered 2018-06-24: 30 mmol via INTRAVENOUS
  Filled 2018-06-24: qty 10

## 2018-06-24 NOTE — Consult Note (Signed)
[facility] Consult   Reason for Consult: ''Medication recommendation'' Referring Physician: Dr. Herbert Moors Patient Identification: Frank Cox MRN:  [id number] Principal Diagnosis: Lithium toxicity Diagnosis:   Patient Active Problem List   Diagnosis Date Noted  . Lithium toxicity [T56.891A] 06/06/2018  . Benign essential HTN [I10] 10/01/2016  . Morbid obesity ([place]) [E66.01] 11/22/2015  . Acute lower GI bleeding [K92.2] 09/26/2014  . Bipolar 1 disorder ([place]) [F31.9] 09/26/2014  . GI bleed [K92.2] 09/26/2014  . Malignant neoplasm of prostate ([place]) [C61] 03/19/2014  . Cellulitis of right leg without foot [L03.115] 11/17/2013  . Cellulitis [L03.90] 11/17/2013  . Diabetes mellitus, type II ([place]) [E11.9] 11/17/2013  . OSA (obstructive sleep apnea) [G47.33] 11/17/2013  . Depression [F32.9]   . Lapband APL Jan 2011 [Z98.84] 12/02/2011    Total Time spent with patient: 45 minutes  Subjective:   Frank Cox is a 57 y.o. male patient admitted with altered mental status  HPI:  Patient who is not able to give history due to altered mental status. He is alert but clearly disoriented to time, place, person and situation. Per chart and charge nurse, patient was admitted due to altered mental status and Lithium toxicity. Per chart, patient was brought to the hospital by EMS activated by his father he was unresponsive on the floor with multiple empty medication bottles by his side, including an empty bottle of his Lithium. Today, patient is combative, incoherent and confused.  Chart review indicates that patient was on the following medications prior to admission: Lithium carbonate CR 1800 at bedtime,  Latuda 40 mg daily, Seroquel 200 mg qhs, Celexa 40 mg, Lamotrigine 400 mg qhs and Clonazepam 1 mg 3x daily and Melatonin 5 mg qhs.   Past Psychiatric History: as above  Risk to Self: Suicidal Ideation: No Suicidal Intent: No Is patient at risk for suicide?: No Suicidal Plan?:  No Access to Means: No What has been your use of drugs/alcohol within the last 12 months?: (patient dnies) How many times?: 0 Other Self Harm Risks: none Triggers for Past Attempts: (n/a) Intentional Self Injurious Behavior: None Risk to Others: Homicidal Ideation: No Thoughts of Harm to Others: No Current Homicidal Intent: No Current Homicidal Plan: No Access to Homicidal Means: No Identified Victim: n/a History of harm to others?: No Assessment of Violence: None Noted Violent Behavior Description: n/a Does patient have access to weapons?: No Criminal Charges Pending?: No Does patient have a court date: No Prior Inpatient Therapy: Prior Inpatient Therapy: No(accessed ED due to AMS; no psych hospitalizations) Prior Outpatient Therapy: Prior Outpatient Therapy: [facility])  Past Medical History:  Past Medical History:  Diagnosis Date  . Anxiety   . Arthritis   . Benign essential HTN 10/01/2016  . Bipolar 1 disorder ([place])    "well controlled on Lithium"  . Bruises easily   . Contact lens/glasses fitting   . Depression   . Diabetes mellitus   . Fatigue   . Gallstones   . Generalized headaches   . Hearing loss   . Hyperlipidemia   . Hypertension    boarderline - elevated  . Kidney stone   . Obesity   . OSA on CPAP   . Poor circulation   . Prostate cancer ([place]) 02/14/14   Gleason 4+3=7, volume 56 mL  . S/P radiation therapy 07/03/2014 through 08/28/2014  Prostate 7800 cGy in 40 sessions, seminal vesicles 5600 cGy in 40 sessions                          Past Surgical History:  Procedure Laterality Date  . LAPAROSCOPIC GASTRIC BANDING  09/09/09  . PROSTATE BIOPSY  02/14/14   Gleason 7, volume 56 mL   Family History:  Family History  Problem Relation Age of Onset  . Arthritis Mother   . Alcohol abuse Mother   . Arthritis Father    Family Psychiatric  History:  Social History:  Social History   Substance and Sexual  Activity  Alcohol Use No     Social History   Substance and Sexual Activity  Drug Use No    Social History   Socioeconomic History  . Marital status: Single    Spouse name: Not on file  . Number of children: Not on file  . Years of education: Not on file  . Highest education level: Not on file  Occupational History  . Not on file  Social Needs  . Financial resource strain: Not on file  . Food insecurity:    Worry: Not on file    Inability: Not on file  . Transportation needs:    Medical: Not on file    Non-medical: Not on file  Tobacco Use  . Smoking status: Never Smoker  . Smokeless tobacco: Never Used  Substance and Sexual Activity  . Alcohol use: No  . Drug use: No  . Sexual activity: Not on file  Lifestyle  . Physical activity:    Days per week: Not on file    Minutes per session: Not on file  . Stress: Not on file  Relationships  . Social connections:    Talks on phone: Not on file    Gets together: Not on file    Attends religious service: Not on file    Active member of club or organization: Not on file    Attends meetings of clubs or organizations: Not on file    Relationship status: Not on file  Other Topics Concern  . Not on file  Social History Narrative  . Not on file   Additional Social History:    Allergies:  No Known Allergies  Labs:  Results for orders placed or performed during the hospital encounter of 06/13/2018 (from the past 48 hour(s))  Comprehensive metabolic panel     Status: Abnormal   Collection Time: 07/03/2018  2:40 AM  Result Value Ref Range   Sodium 140 135 - 145 mmol/L   Potassium 3.1 (L) 3.5 - 5.1 mmol/L   Chloride 112 (H) 98 - 111 mmol/L   CO2 21 (L) 22 - 32 mmol/L   Glucose, Bld 214 (H) 70 - 99 mg/dL   BUN 8 6 - 20 mg/dL   Creatinine, Ser 1.75 (H) 0.61 - 1.24 mg/dL   Calcium 11.0 (H) 8.9 - 10.3 mg/dL   Total Protein 6.5 6.5 - 8.1 g/dL   Albumin 3.4 (L) 3.5 - 5.0 g/dL   AST 17 15 - 41 U/L   ALT 16 0 - 44 U/L    Alkaline Phosphatase 67 38 - 126 U/L   Total Bilirubin 0.6 0.3 - 1.2 mg/dL   GFR calc non Af Amer 41 (L) >60 mL/min   GFR calc Af Amer 48 (L) >60 mL/min    Comment: (NOTE) The eGFR has been calculated using the CKD EPI equation.  This calculation has not been validated in all clinical situations. eGFR's persistently <60 mL/min signify possible Chronic Kidney Disease.    Anion gap 7 5 - 15    Comment: Performed at [facility] [address] [place], [place] [address]  CBC     Status: Abnormal   Collection Time: 06/28/2018  2:40 AM  Result Value Ref Range   WBC 9.5 4.0 - 10.5 K/uL   RBC 4.00 (L) 4.22 - 5.81 MIL/uL   Hemoglobin 12.3 (L) 13.0 - 17.0 g/dL   HCT 38.5 (L) 39.0 - 52.0 %   MCV 96.3 80.0 - 100.0 fL   MCH 30.8 26.0 - 34.0 pg   MCHC 31.9 30.0 - 36.0 g/dL   RDW 14.1 11.5 - 15.5 %   Platelets 298 150 - 400 K/uL   nRBC 0.0 0.0 - 0.2 %    Comment: Performed at [facility], [place] [address], [place], [place] [address]  CBG monitoring, ED     Status: Abnormal   Collection Time: 06/18/2018  2:50 AM  Result Value Ref Range   Glucose-Capillary 207 (H) 70 - 99 mg/dL  Salicylate level     Status: None   Collection Time: 06/09/2018  5:10 AM  Result Value Ref Range   Salicylate Lvl <4.1 2.8 - 30.0 mg/dL    Comment: Performed at [facility], [place] [address] [place], [place] [address]  Ethanol     Status: None   Collection Time: 06/10/2018  5:10 AM  Result Value Ref Range   Alcohol, Ethyl (B) <10 <10 mg/dL    Comment: (NOTE) Lowest detectable limit for serum alcohol is 10 mg/dL. For medical purposes only. Performed at [facility], [place] [address], [place], [place] [address]   Acetaminophen level     Status: Abnormal   Collection Time: 06/29/2018  5:10 AM  Result Value Ref Range   Acetaminophen (Tylenol), Serum <10 (L) 10 - 30 ug/mL    Comment: (NOTE) Therapeutic concentrations vary significantly. A range of 10-30 ug/mL  may be an effective concentration for  many patients. However, some  are best treated at concentrations outside of this range. Acetaminophen concentrations >150 ug/mL at 4 hours after ingestion  and >50 ug/mL at 12 hours after ingestion are often associated with  toxic reactions. Performed at [facility], [place] [address] [place], [place] [address]   Lithium level     Status: Abnormal   Collection Time: 06/06/2018  5:10 AM  Result Value Ref Range   Lithium Lvl 2.37 (HH) 0.60 - 1.20 mmol/L    Comment: CRITICAL RESULT CALLED TO, READ BACK BY AND VERIFIED WITH:  B BALIFF RN [phone] 06/26/2018 BY A BENNETT Performed at [facility], [place] [address], [place], [place] [address]   Urinalysis, Routine w reflex microscopic     Status: Abnormal   Collection Time: 06/16/2018  5:49 AM  Result Value Ref Range   Color, Urine STRAW (A) YELLOW   APPearance CLEAR CLEAR   Specific Gravity, Urine 1.003 (L) 1.005 - 1.030   pH 7.0 5.0 - 8.0   Glucose, UA 50 (A) NEGATIVE mg/dL   Hgb urine dipstick SMALL (A) NEGATIVE   Bilirubin Urine NEGATIVE NEGATIVE   Ketones, ur NEGATIVE NEGATIVE mg/dL   Protein, ur 30 (A) NEGATIVE mg/dL   Nitrite NEGATIVE NEGATIVE   Leukocytes, UA NEGATIVE NEGATIVE   RBC / HPF 0-5 0 - 5 RBC/hpf   WBC, UA  0-5 0 - 5 WBC/hpf   Bacteria, UA RARE (A) NONE SEEN   Mucus PRESENT     Comment: Performed at [facility], [place] [address], [place], [place] [address]  Rapid urine drug screen (hospital performed)     Status: None   Collection Time: 06/07/2018  5:49 AM  Result Value Ref Range   Opiates NONE DETECTED NONE DETECTED   Cocaine NONE DETECTED NONE DETECTED   Benzodiazepines NONE DETECTED NONE DETECTED   Amphetamines NONE DETECTED NONE DETECTED   Tetrahydrocannabinol NONE DETECTED NONE DETECTED   Barbiturates NONE DETECTED NONE DETECTED    Comment: (NOTE) DRUG SCREEN FOR MEDICAL PURPOSES ONLY.  IF CONFIRMATION IS NEEDED FOR ANY PURPOSE, NOTIFY LAB WITHIN 5 DAYS. LOWEST DETECTABLE LIMITS FOR URINE DRUG  SCREEN Drug Class                     Cutoff (ng/mL) Amphetamine and metabolites    1000 Barbiturate and metabolites    200 Benzodiazepine                 287 Tricyclics and metabolites     300 Opiates and metabolites        300 Cocaine and metabolites        300 THC                            50 Performed at [facility], [place] [address] [place], [place] [address]   Magnesium     Status: None   Collection Time: 07/01/2018  8:35 AM  Result Value Ref Range   Magnesium 2.2 1.7 - 2.4 mg/dL    Comment: Performed at [facility] [address], [place], [place] [address]  Lactic acid, plasma     Status: None   Collection Time: 06/07/2018  8:35 AM  Result Value Ref Range   Lactic Acid, Venous 1.7 0.5 - 1.9 mmol/L    Comment: Performed at [facility] [address], [place], [place] [address]  Basic metabolic panel     Status: Abnormal   Collection Time: 06/26/2018  8:35 AM  Result Value Ref Range   Sodium 143 135 - 145 mmol/L   Potassium 3.1 (L) 3.5 - 5.1 mmol/L   Chloride 117 (H) 98 - 111 mmol/L   CO2 21 (L) 22 - 32 mmol/L   Glucose, Bld 192 (H) 70 - 99 mg/dL   BUN 7 6 - 20 mg/dL   Creatinine, Ser 1.66 (H) 0.61 - 1.24 mg/dL   Calcium 10.7 (H) 8.9 - 10.3 mg/dL   GFR calc non Af Amer 44 (L) >60 mL/min   GFR calc Af Amer 51 (L) >60 mL/min    Comment: (NOTE) The eGFR has been calculated using the CKD EPI equation. This calculation has not been validated in all clinical situations. eGFR's persistently <60 mL/min signify possible Chronic Kidney Disease.    Anion gap 5 5 - 15    Comment: Performed at [facility] [address], [place], [place] [address]  CK     Status: None   Collection Time: 06/17/2018  8:35 AM  Result Value Ref Range   Total CK 81 49 - 397 U/L    Comment: Performed at [facility], [place] [address], [place], [place] [address]  Glucose, capillary     Status: Abnormal   Collection Time: 06/09/2018 10:25 AM  Result Value Ref Range    Glucose-Capillary 173 (H) 70 - 99 mg/dL  Blood gas, arterial     Status: Abnormal   Collection Time: 07/02/2018 10:27 AM  Result Value Ref Range   FIO2 21.00    pH, Arterial 7.326 (L) 7.350 - 7.450   pCO2 arterial 43.0 32.0 - 48.0 mmHg   pO2, Arterial 57.8 (L) 83.0 - 108.0 mmHg   Bicarbonate 21.9 20.0 - 28.0 mmol/L   Acid-base deficit 3.2 (H) 0.0 - 2.0 mmol/L   O2 Saturation 92.0 %   Patient temperature 98.2    Collection site RIGHT RADIAL    Drawn by [id number]    Sample type ARTERIAL DRAW    Allens test (pass/fail) PASS PASS  Glucose, capillary     Status: Abnormal   Collection Time: 06/06/2018 11:23 AM  Result Value Ref Range   Glucose-Capillary 169 (H) 70 - 99 mg/dL  Lactic acid, plasma     Status: None   Collection Time: 06/27/2018 11:58 AM  Result Value Ref Range   Lactic Acid, Venous 0.9 0.5 - 1.9 mmol/L    Comment: Performed at [facility], [address] [place], [place] [address]  Basic metabolic panel     Status: Abnormal   Collection Time: 07/01/2018 11:58 AM  Result Value Ref Range   Sodium 146 (H) 135 - 145 mmol/L   Potassium 3.6 3.5 - 5.1 mmol/L   Chloride 124 (H) 98 - 111 mmol/L   CO2 21 (L) 22 - 32 mmol/L   Glucose, Bld 257 (H) 70 - 99 mg/dL   BUN 7 6 - 20 mg/dL   Creatinine, Ser 1.65 (H) 0.61 - 1.24 mg/dL   Calcium 10.6 (H) 8.9 - 10.3 mg/dL   GFR calc non Af Amer 44 (L) >60 mL/min   GFR calc Af Amer 51 (L) >60 mL/min    Comment: (NOTE) The eGFR has been calculated using the CKD EPI equation. This calculation has not been validated in all clinical situations. eGFR's persistently <60 mL/min signify possible Chronic Kidney Disease.    Anion gap <3 (L) 5 - 15    Comment: RESULT REPEATED AND VERIFIED Performed at [facility], [place] [address], [place], [place] [address]   Lithium level     Status: Abnormal   Collection Time: 06/29/2018 11:58 AM  Result Value Ref Range   Lithium Lvl 1.64 (HH) 0.60 - 1.20 mmol/L    Comment: CRITICAL RESULT CALLED TO, READ  BACK BY AND VERIFIED WITH: Truddie Hidden RN [id number] 06/06/2018 BY A BENNETT Performed at [facility], [place] [address] [place], [place] [address]   CBC     Status: Abnormal   Collection Time: 06/09/2018 11:58 AM  Result Value Ref Range   WBC 9.7 4.0 - 10.5 K/uL   RBC 3.73 (L) 4.22 - 5.81 MIL/uL   Hemoglobin 11.3 (L) 13.0 - 17.0 g/dL   HCT 36.5 (L) 39.0 - 52.0 %   MCV 97.9 80.0 - 100.0 fL   MCH 30.3 26.0 - 34.0 pg   MCHC 31.0 30.0 - 36.0 g/dL   RDW 14.4 11.5 - 15.5 %   Platelets 284 150 - 400 K/uL   nRBC 0.0 0.0 - 0.2 %    Comment: Performed at [facility], [place] [address] [place], [place] [address]  Hepatitis B surface antigen     Status: None   Collection Time: 06/10/2018 11:58 AM  Result Value Ref Range   Hepatitis  B Surface Ag Negative Negative    Comment: (NOTE) Performed At: [facility] [place], [place] [id number] Rush Farmer MD [id number]   Hepatitis B surface antibody,qualitative     Status: None   Collection Time: 07/05/2018 11:58 AM  Result Value Ref Range   Hep B S Ab Non Reactive     Comment: (NOTE)              Non Reactive: Inconsistent with immunity,                            less than 10 mIU/mL              Reactive:     Consistent with immunity,                            greater than 9.9 mIU/mL Performed At: [facility] [place], [place] [id number] Rush Farmer MD [id number]   Hepatitis c antibody (reflex)     Status: None   Collection Time: 07/02/2018 11:58 AM  Result Value Ref Range   HCV Ab <0.1 0.0 - 0.9 s/co ratio    Comment: (NOTE) Performed At: [facility] [place], [place] [id number] Rush Farmer MD NO:[id number]   TSH     Status: None   Collection Time: 06/21/2018 11:58 AM  Result Value Ref Range   TSH 2.691 0.350 - 4.500 uIU/mL    Comment: Performed by a 3rd Generation assay with a functional sensitivity of <=0.01 uIU/mL. Performed at [facility],  [place] [address] [place], [place] [address]   HCV Comment:     Status: None   Collection Time: 06/21/2018 11:58 AM  Result Value Ref Range   Comment: Comment     Comment: (NOTE) Non reactive HCV antibody screen is consistent with no HCV infection, unless recent infection is suspected or other evidence exists to indicate HCV infection. Performed At: [facility] [place], [place] [id number] Rush Farmer MD YT:[id number]   Hepatic function panel     Status: Abnormal   Collection Time: 06/24/2018 11:59 AM  Result Value Ref Range   Total Protein 5.9 (L) 6.5 - 8.1 g/dL   Albumin 3.1 (L) 3.5 - 5.0 g/dL   AST 11 (L) 15 - 41 U/L   ALT 13 0 - 44 U/L   Alkaline Phosphatase 61 38 - 126 U/L   Total Bilirubin 0.8 0.3 - 1.2 mg/dL   Bilirubin, Direct <0.1 0.0 - 0.2 mg/dL   Indirect Bilirubin NOT CALCULATED 0.3 - 0.9 mg/dL    Comment: Performed at [facility] [address] [place], [place] [address]  MRSA PCR Screening     Status: None   Collection Time: 06/06/2018 12:02 PM  Result Value Ref Range   MRSA by PCR NEGATIVE NEGATIVE    Comment:        The GeneXpert MRSA Assay (FDA approved for NASAL specimens only), is one component of a comprehensive MRSA colonization surveillance program. It is not intended to diagnose MRSA infection nor to guide or monitor treatment for MRSA infections. Performed at [facility], [place] [address], [place], [place] [address]   Glucose, capillary     Status: Abnormal   Collection Time: 07/03/2018  3:16 PM  Result Value Ref Range   Glucose-Capillary 206 (H) 70 - 99 mg/dL  Glucose, capillary     Status: Abnormal   Collection Time: 06/08/2018  8:44 PM  Result Value Ref Range   Glucose-Capillary 164 (H) 70 - 99 mg/dL  Basic metabolic panel     Status: Abnormal   Collection Time: 06/12/2018  8:50 PM  Result Value Ref Range   Sodium 143 135 - 145 mmol/L   Potassium 2.9 (L) 3.5 - 5.1 mmol/L    Comment: DELTA CHECK NOTED   Chloride  113 (H) 98 - 111 mmol/L   CO2 27 22 - 32 mmol/L   Glucose, Bld 204 (H) 70 - 99 mg/dL   BUN <5 (L) 6 - 20 mg/dL   Creatinine, Ser 1.20 0.61 - 1.24 mg/dL   Calcium 9.1 8.9 - 10.3 mg/dL   GFR calc non Af Amer >60 >60 mL/min   GFR calc Af Amer >60 >60 mL/min    Comment: (NOTE) The eGFR has been calculated using the CKD EPI equation. This calculation has not been validated in all clinical situations. eGFR's persistently <60 mL/min signify possible Chronic Kidney Disease.    Anion gap 3 (L) 5 - 15    Comment: Performed at [facility], [place] [address] [place], [place] [address]  Glucose, capillary     Status: Abnormal   Collection Time: 06/24/18 12:21 AM  Result Value Ref Range   Glucose-Capillary 159 (H) 70 - 99 mg/dL  Basic metabolic panel     Status: Abnormal   Collection Time: 06/24/18  2:58 AM  Result Value Ref Range   Sodium 145 135 - 145 mmol/L   Potassium 3.1 (L) 3.5 - 5.1 mmol/L   Chloride 115 (H) 98 - 111 mmol/L   CO2 26 22 - 32 mmol/L   Glucose, Bld 178 (H) 70 - 99 mg/dL   BUN <5 (L) 6 - 20 mg/dL   Creatinine, Ser 1.31 (H) 0.61 - 1.24 mg/dL   Calcium 9.8 8.9 - 10.3 mg/dL   GFR calc non Af Amer 58 (L) >60 mL/min   GFR calc Af Amer >60 >60 mL/min    Comment: (NOTE) The eGFR has been calculated using the CKD EPI equation. This calculation has not been validated in all clinical situations. eGFR's persistently <60 mL/min signify possible Chronic Kidney Disease.    Anion gap 4 (L) 5 - 15    Comment: Performed at [facility] [address], [place], [place] [address]  Lithium level     Status: None   Collection Time: 06/24/18  2:58 AM  Result Value Ref Range   Lithium Lvl 0.91 0.60 - 1.20 mmol/L    Comment: Performed at [facility] [address] [place],  [address]  Renal function panel     Status: Abnormal   Collection Time: 06/24/18  2:58 AM  Result Value Ref Range   Sodium 146 (H) 135 - 145 mmol/L   Potassium 3.1 (L) 3.5 - 5.1 mmol/L    Chloride 116 (H) 98 - 111 mmol/L   CO2 26 22 - 32 mmol/L   Glucose, Bld 177 (H) 70 - 99 mg/dL   BUN <5 (L) 6 - 20 mg/dL   Creatinine, Ser 1.30 (H) 0.61 - 1.24 mg/dL   Calcium 9.8 8.9 - 10.3 mg/dL   Phosphorus <1.0 (LL) 2.5 - 4.6 mg/dL    Comment: CRITICAL RESULT CALLED TO, READ BACK BY AND VERIFIED WITH: [facility] RN 06/24/2018 0406 JORDANS    Albumin 3.1 (L) 3.5 - 5.0 g/dL  GFR calc non Af Amer 59 (L) >60 mL/min   GFR calc Af Amer >60 >60 mL/min    Comment: (NOTE) The eGFR has been calculated using the CKD EPI equation. This calculation has not been validated in all clinical situations. eGFR's persistently <60 mL/min signify possible Chronic Kidney Disease.    Anion gap 4 (L) 5 - 15    Comment: Performed at [facility] [address], [place], [place] [address]  Glucose, capillary     Status: Abnormal   Collection Time: 06/24/18  3:03 AM  Result Value Ref Range   Glucose-Capillary 158 (H) 70 - 99 mg/dL  Glucose, capillary     Status: Abnormal   Collection Time: 06/24/18  8:04 AM  Result Value Ref Range   Glucose-Capillary 275 (H) 70 - 99 mg/dL  Basic metabolic panel     Status: Abnormal   Collection Time: 06/24/18 10:07 AM  Result Value Ref Range   Sodium 148 (H) 135 - 145 mmol/L   Potassium 3.1 (L) 3.5 - 5.1 mmol/L   Chloride 118 (H) 98 - 111 mmol/L   CO2 23 22 - 32 mmol/L   Glucose, Bld 241 (H) 70 - 99 mg/dL   BUN 5 (L) 6 - 20 mg/dL   Creatinine, Ser 1.56 (H) 0.61 - 1.24 mg/dL   Calcium 10.1 8.9 - 10.3 mg/dL   GFR calc non Af Amer 47 (L) >60 mL/min   GFR calc Af Amer 55 (L) >60 mL/min    Comment: (NOTE) The eGFR has been calculated using the CKD EPI equation. This calculation has not been validated in all clinical situations. eGFR's persistently <60 mL/min signify possible Chronic Kidney Disease.    Anion gap 7 5 - 15    Comment: Performed at [facility] [address], [place], [place] [address]    Current Facility-Administered Medications   Medication Dose Route Frequency Provider Last Rate Last Dose  . 0.45 % sodium chloride infusion   Intravenous Continuous [facility] Colace, NP 50 mL/hr at 06/24/18 1218    . 0.9 %  sodium chloride infusion  100 mL Intravenous PRN Deterding, Jeneen Rinks, MD      . 0.9 %  sodium chloride infusion  100 mL Intravenous PRN Deterding, Jeneen Rinks, MD      . alteplase (CATHFLO ACTIVASE) injection 2 mg  2 mg Intracatheter Once PRN Mauricia Area, MD      . chlorhexidine (PERIDEX) 0.12 % solution 15 mL  15 mL Mouth Rinse BID Kipp Brood, MD   15 mL at 06/24/18 0845  . Chlorhexidine Gluconate Cloth 2 % PADS 6 each  6 each Topical [id number] Mauricia Area, MD   6 each at 06/24/18 1410  . finasteride (PROSCAR) tablet 5 mg  5 mg Oral Daily Agarwala, Ravi, MD   5 mg at 06/24/18 1034  . heparin injection 1,000 Units  1,000 Units Dialysis PRN Deterding, Jeneen Rinks, MD      . heparin injection 15,000 Units  100 Units/kg Dialysis PRN Deterding, Jeneen Rinks, MD      . heparin injection 5,000 Units  5,000 Units Subcutaneous Q8H Agarwala, Einar Grad, MD   5,000 Units at 06/24/18 1412  . hydrALAZINE (APRESOLINE) injection 10 mg  10 mg Intravenous Q4H PRN Agarwala, Einar Grad, MD      . insulin aspart (novoLOG) injection 0-20 Units  0-20 Units Subcutaneous TID WC [facility]rick Colace, NP   7 Units at 06/24/18 1302  . insulin aspart (novoLOG) injection 0-5 Units  0-5  Units Subcutaneous QHS Erick Colace, NP      . Derrill Memo ON 06/25/2018] insulin glargine (LANTUS) injection 10 Units  10 Units Subcutaneous Daily Salvadore Dom E, NP      . lidocaine (PF) (XYLOCAINE) 1 % injection 5 mL  5 mL Intradermal PRN Deterding, Jeneen Rinks, MD      . lidocaine-prilocaine (EMLA) cream 1 application  1 application Topical PRN Deterding, Jeneen Rinks, MD      . MEDLINE mouth rinse  15 mL Mouth Rinse q12n4p Agarwala, Ravi, MD      . nystatin (MYCOSTATIN/NYSTOP) topical powder   Topical BID Rigoberto Noel, MD      . pentafluoroprop-tetrafluoroeth (GEBAUERS) aerosol 1 application  1  application Topical PRN Deterding, Jeneen Rinks, MD      . potassium chloride 10 mEq in 50 mL *CENTRAL LINE* IVPB  10 mEq Intravenous Q1 Hr x 4 [place] Colace, NP 50 mL/hr at 06/24/18 1410 10 mEq at 06/24/18 1410  . tamsulosin (FLOMAX) capsule 0.8 mg  0.8 mg Oral Daily Kipp Brood, MD   0.8 mg at 06/24/18 1033    Musculoskeletal: Strength & Muscle Tone: within normal limits Gait & Station: unable to stand Patient leans: N/A  Psychiatric Specialty Exam: Physical Exam  Psychiatric: His affect is blunt. His speech is delayed and tangential. He is combative. Cognition and memory are impaired. He expresses impulsivity.    Review of Systems  Constitutional: Negative.   HENT: Negative.   Skin: Negative.   Neurological: Positive for tremors.  Psychiatric/Behavioral: Positive for suicidal ideas.    Blood pressure (!) 172/79, pulse 60, temperature 98 F (36.7 C), temperature source Axillary, resp. rate (!) 30, SpO2 98 %.There is no height or weight on file to calculate BMI.  General Appearance: Casual  Eye Contact:  Minimal  Speech:  Pressured  Volume:  Increased  Mood:  Irritable  Affect:  Labile  Thought Process:  Disorganized  Orientation:  Other:  disoriented to time, place and person  Thought Content:  Illogical and Tangential  Suicidal Thoughts:  unable to assess due to being confused  Homicidal Thoughts:  unable to assess due to being confused  Memory:  unable to assess due to being confused  Judgement:  Impaired  Insight:  unable to assess due to being confused  Psychomotor Activity:  Restlessness and agitated  Concentration:  Concentration: Poor and Attention Span: Poor  Recall:  unable to assess due to being confused  Fund of Knowledge:  unable to assess due to being confused  Language:  Fair  Akathisia:  no  Handed:  Right  AIMS (if indicated):     Assets:  Social Support  ADL's:  Impaired  Cognition:  confused  Sleep:        Treatment Plan Summary: 57 year old man  with history of Bipolar disorder who was admitted with altered mental status but found to have Lithium toxicity requiring Hemodialysis. Patient is combative, confused, disoriented and unable to give history. Lithium level today is 0.9  Recommendations:  -Do not restart Lithium Carbonate -Recheck Lithium level daily until it is less than 0.5 or below. -Consider re-starting Seroquel 200 mg qhs and Lamictal 400 mg qhs when patient is alert and oriented. -Consider Haldol 2 mg IM/PO Q4h as needed for agitation -Re-consult psych when encephalopathy is cleared(at the moment unable to determine the cause of Lithium toxicity from the patient).  Disposition: Recommend psychiatric Inpatient admission when medically cleared.  Corena Pilgrim, MD 06/24/2018 3:21 PM

## 2018-06-24 NOTE — Progress Notes (Signed)
eLink Physician-Brief Progress Note Patient Name: Frank Cox DOB: 23-Nov-1960 MRN: [id number]   Date of Service  06/24/2018  HPI/Events of Note  Low serum phosphorus  eICU Interventions  Order to replete Phosphorus entered        Frederik Pear 06/24/2018, 4:09 AM

## 2018-06-24 NOTE — Progress Notes (Signed)
NAME:  Frank Cox, MRN:  [id number], DOB:  1960-10-10, LOS: 1 ADMISSION DATE:  06/10/2018, CONSULTATION DATE:  06/29/2018 REFERRING MD:  Stark Jock, ED [facility], CHIEF COMPLAINT:  Alerted mental status.  Brief History   The patient is a 57 year old man who requires urgent hemodialysis for acute on chronic lithium toxicity. Has been increasingly confused, experiencing multiple falls over the last week.  Li level 2.37 (upper limit 1.2) and nephrology wishes to dialyze the patient.    Past Medical History  Bipolar affective disorder, T2DM on OHA, HTN, Prostate cancer, OSA on CPAP.  Significant Hospital Events   Admitted 10/18.  Had acute renal failure with creatinine 1.61, Foley catheter was placed in he put out about 2 L immediately after that.  Underwent emergent dialysis for lithium toxicity 10/19: Renal function improving.  Lithium has normalized.  Still impulsive and confused, agitated from time to time.  Glucose poorly controlled.  Not medically ready for discharge, still needs psychiatric evaluation as well.  Spoke to urology regarding concern about obstructive uropathy, they recommended keeping Foley catheter in place given recent renal injury and having him follow-up as outpatient  Consults: date of consult/date signed off & final recs:  Nephrology 10/18 - recommending HD Poison Control - recommending hydration, following serial Li levels.  Procedures (surgical and bedside):  None  Significant Diagnostic Tests:  CT head 10/15 for similar symptoms negative.  Micro Data:  N/A  Antimicrobials:  N/A   Subjective:   Denies pain wants to go home Objective   Blood pressure (Abnormal) 156/71, pulse 60, temperature 98.3 F (36.8 C), temperature source Oral, resp. rate (Abnormal) 28, SpO2 97 %.        Intake/Output Summary (Last 24 hours) at 06/24/2018 0959 Last data filed at 06/24/2018 0900 Gross per 24 hour  Intake 4951.99 ml  Output 7290 ml  Net -2338.01 ml   There were no  vitals filed for this visit.  Examination:  General: 57 year old obese male currently sitting in bed he is in no acute distress HEENT normocephalic atraumatic no jugular venous distention mucous membranes moist Pulmonary: Clear to auscultation diminished bases Cardiac: Regular rate and rhythm without murmur rub or gallop Abdomen: Soft nontender no organomegaly positive bowel sounds Extremities : No significant edema brisk cap refill GU: Clear yellow urine  Neuro: Awake oriented but impulsive and agitated at times  Resolved Hospital Problem list   None  Assessment & Plan:   Acute on chronic Li toxicity with acute toxic encephalopathy -  -s/p HD; mental status improved. Lithium level normalized. Oriented but impulsive and confused/agitated at times.  Plan Cont IVFs Will keep HD cath for another 24hrs Safety sitter at bedside  Bipolar affective disorder - patient denies suicidal intent, [place] has seen over telemetry Plan Will need Psych consult when encephalopathy clears  Acute hypercarbic respiratory insufficieny on background of OSA.  ->clinically improved.  Plan Pulse ox  Acute on chronic renal failure Stage 2 CKD - recent worsening likely due to obstructive Uropathy (suspect c/b his underlying prostate cancer) AND Li toxicity  -put out > 1 liter after foley cath placed -still w/ high urine output ? Post-obstructive vs 2/2 lithium induced injury; creatine peaked at 1.65; dropped down now to 1.3 Plan Keep f/c in place Spoke with Dr. Alyson Ingles with [facility], he will need follow-up at dc re: catheter F/u am chemistry   Fluid and electrolyte imbalance: Hypernatremia, hypokalemia & hypophosphatemia  Plan Replace K Encourage free water replacement Change MIVF  to 1/2 NS   Hypertension Plan PRN hydralazine  DM w/ hyperglycemia  Plan ssi will add basal dosing    Disposition / Summary of Today's Plan 06/24/18   Clinically improved.  Lithium normalized.  Renal  function improved.  Has high urine output, will need to keep up with this.  Spoke with urology, they recommend keeping Foley in place and follow-up as outpatient.  Still impulsive, still confused, needs psychiatric evaluation will place order.     Diet: Regular diet Pain/Anxiety/Delirium protocol (if indicated): none VAP protocol (if indicated): N/A DVT prophylaxis: UFH GI prophylaxis: N/A Hyperglycemia protocol: phase 1 hyperglycemia Mobility: OOB w/ assist Code Status: full. Family Communication: no family present.  Labs (personally reviewed)  CBC: Recent Labs  Lab 06/20/18 1028 06/22/2018 0240 06/27/2018 1158  WBC 12.4* 9.5 9.7  NEUTROABS 10.1*  --   --   HGB 12.9* 12.3* 11.3*  HCT 41.0 38.5* 36.5*  MCV 97.9 96.3 97.9  PLT 279 298 469    Basic Metabolic Panel: Recent Labs  Lab 07/04/2018 0240 06/09/2018 0835 06/27/2018 1158 06/13/2018 2050 06/24/18 0258  NA 140 143 146* 143 146*  145  K 3.1* 3.1* 3.6 2.9* 3.1*  3.1*  CL 112* 117* 124* 113* 116*  115*  CO2 21* 21* 21* 27 26  26   GLUCOSE 214* 192* 257* 204* 177*  178*  BUN 8 7 7  <5* <5*  <5*  CREATININE 1.75* 1.66* 1.65* 1.20 1.30*  1.31*  CALCIUM 11.0* 10.7* 10.6* 9.1 9.8  9.8  MG  --  2.2  --   --   --   PHOS  --   --   --   --  <1.0*   GFR: CrCl cannot be calculated (Unknown ideal weight.). Recent Labs  Lab 06/20/18 1028 06/08/2018 0240 07/05/2018 0835 06/19/2018 1158  WBC 12.4* 9.5  --  9.7  LATICACIDVEN  --   --  1.7 0.9    Liver Function Tests: Recent Labs  Lab 06/20/18 1028 07/05/2018 0240 06/30/2018 1159 06/24/18 0258  AST 14* 17 11*  --   ALT 14 16 13   --   ALKPHOS 78 67 61  --   BILITOT 1.4* 0.6 0.8  --   PROT 7.1 6.5 5.9*  --   ALBUMIN 4.0 3.4* 3.1* 3.1*   No results for input(s): LIPASE, AMYLASE in the last 168 hours. No results for input(s): AMMONIA in the last 168 hours.  ABG    Component Value Date/Time   PHART 7.326 (L) 07/02/2018 1027   PCO2ART 43.0 06/08/2018 1027   PO2ART 57.8  (L) 06/16/2018 1027   HCO3 21.9 06/28/2018 1027   TCO2 18.0 09/09/2009 0935   ACIDBASEDEF 3.2 (H) 06/29/2018 1027   O2SAT 92.0 06/22/2018 1027     Coagulation Profile: No results for input(s): INR, PROTIME in the last 168 hours.  Cardiac Enzymes: Recent Labs  Lab 06/20/2018 0835  CKTOTAL 81    HbA1C: Hemoglobin A1C  Date/Time Value Ref Range Status  08/25/2015 9.9  Final   Hgb A1c MFr Bld  Date/Time Value Ref Range Status  07/19/2017 08:30 AM 5.5 4.6 - 6.5 % Final    Comment:    Glycemic Control Guidelines for People with Diabetes:Non Diabetic:  <6%Goal of Therapy: <7%Additional Action Suggested:  >8%   04/21/2017 10:13 AM 5.7 4.6 - 6.5 % Final    Comment:    Glycemic Control Guidelines for People with Diabetes:Non Diabetic:  <6%Goal of Therapy: <7%Additional Action Suggested:  >8%  CBG: Recent Labs  Lab 06/11/2018 1516 06/21/2018 2044 06/24/18 0021 06/24/18 0303 06/24/18 0804  GLUCAP 206* 164* 159* 158* [place] ACNP-BC [place] Pager # [phone] OR # [phone] if no answer

## 2018-06-24 NOTE — Progress Notes (Signed)
CRITICAL VALUE ALERT  Critical Value:  Phosphorus <1  Date & Time Notied:  06/24/18 0408  Provider Notified: Su Grand MD  Orders Received/Actions taken: see [facility]

## 2018-06-24 NOTE — Progress Notes (Signed)
eLink Physician-Brief Progress Note Patient Name: Frank Cox DOB: Jan 30, 1961 MRN: [id number]   Date of Service  06/24/2018  HPI/Events of Note  Hypokalemia, ESRD  eICU Interventions  KCL 40 meq PO x 1        Okoron[place]wo U Ogan 06/24/2018, 12:55 AM

## 2018-06-24 NOTE — Progress Notes (Signed)
Subjective: Interval History: has no complaint, fells much better, Dr. Tresa Moore is Urologist.  Objective: Vital signs in last 24 hours: Temp:  [97.7 F (36.5 C)-98.4 F (36.9 C)] 98.3 F (36.8 C) (10/19 0808) Pulse Rate:  [42-111] 57 (10/19 0607) Resp:  [13-36] 23 (10/19 0607) BP: (81-171)/(42-98) 156/80 (10/19 0607) SpO2:  [89 %-100 %] 100 % (10/19 9381) Weight change:   Intake/Output from previous day: 10/18 0701 - 10/19 0700 In: 6029 [P.O.:1676; I.V.:2768.5; IV Piggyback:1584.6] Out: 7440 [Urine:7440] Intake/Output this shift: No intake/output data recorded.  General appearance: alert, cooperative, no distress and moderately obese Resp: diminished breath sounds bilaterally Chest wall: [facility] cath Cardio: S1, S2 normal and systolic murmur: systolic ejection 2/6, crescendo and decrescendo at 2nd left intercostal space GI: obese, pos bs, liver down 5 cm Extremities: edema 2+ and stasis changes LE  Lab Results: Recent Labs    06/27/2018 0240 06/20/2018 1158  WBC 9.5 9.7  HGB 12.3* 11.3*  HCT 38.5* 36.5*  PLT 298 284   BMET:  Recent Labs    06/14/2018 2050 06/24/18 0258  NA 143 146*  145  K 2.9* 3.1*  3.1*  CL 113* 116*  115*  CO2 27 26  26   GLUCOSE 204* 177*  178*  BUN <5* <5*  <5*  CREATININE 1.20 1.30*  1.31*  CALCIUM 9.1 9.8  9.8   No results for input(s): PTH in the last 72 hours. Iron Studies: No results for input(s): IRON, TIBC, TRANSFERRIN, FERRITIN in the last 72 hours.  Studies/Results: US Renal  Result Date: 06/10/2018 CLINICAL DATA:  Acute kidney injury.  Hypertension and diabetes. EXAM: RENAL / URINARY TRACT ULTRASOUND COMPLETE COMPARISON:  CT 08/10/2016 FINDINGS: Right Kidney: Length: 13.4 cm, consistent with diabetic glomerular nephropathy. No hydronephrosis. Two renal cysts in the 2-3 cm size range. Parenchymal echogenicity is slightly increased. Left Kidney: Length: 16 0.0 cm, consistent with diabetic the Candis Schatz for optic the. No  hydronephrosis. One cyst measuring up to 2 cm in diameter. Parenchymal echogenicity is slightly increased. Bladder: Foley catheter in the bladder. IMPRESSION: Enlarged echogenic kidneys consistent with diabetic glomerular nephropathy. No obstruction. Bilateral benign appearing renal cysts. Electronically Signed   By: Nelson Chimes M.D.   On: 06/21/2018 17:54   Dg Chest Port 1 View  Result Date: 06/09/2018 CLINICAL DATA:  Central line placement EXAM: PORTABLE CHEST 1 VIEW COMPARISON:  05/10/2017 FINDINGS: Right subclavian central line with tip directed inferiorly, seen at the level of the upper SVC. Low volume chest with vascular congestion. There is asymmetric airspace disease in the right mid lung. Cardiopericardial enlargement. Vascular pedicle widening, accentuated by rotation IMPRESSION: 1. Right-sided central line with tip near the upper SVC. No evident pneumothorax. 2. Cardiomegaly and vascular congestion. 3. Asymmetric alveolar edema versus pneumonia on the right. Electronically Signed   By: Monte Fantasia M.D.   On: 06/13/2018 12:52    I have reviewed the patient's current medications.  Assessment/Plan: 1 Li toxicity level acceptable.  MS dramatic turn around. Making a lot of urine.  ? Post obstructive &/or Nicoletta Dress induced injury 2 AKI improving Li and obstruction 3 Bipolar 4 Prostate Ca need to get Urology involved with obstruction 5 Obesity 6 HTn allow to diurese 7 DM 8low K replete 9 Low phos replete P lower ivf, follow CR, phos, K.  Please consult Urology   LOS: 1 day   Jeneen Rinks Illyana Schorsch 06/24/2018,8:19 AM

## 2018-06-25 ENCOUNTER — Inpatient Hospital Stay (HOSPITAL_COMMUNITY): Payer: 59

## 2018-06-25 LAB — URINALYSIS, ROUTINE W REFLEX MICROSCOPIC
BILIRUBIN URINE: NEGATIVE
KETONES UR: NEGATIVE mg/dL
NITRITE: NEGATIVE
PH: 7 (ref 5.0–8.0)
Protein, ur: 100 mg/dL — AB
SPECIFIC GRAVITY, URINE: 1.005 (ref 1.005–1.030)
WBC, UA: >50 WBC/hpf — ABNORMAL HIGH (ref 0–5)

## 2018-06-25 LAB — BASIC METABOLIC PANEL
BUN: 8 mg/dL (ref 6–20)
BUN: 8 mg/dL (ref 6–20)
CALCIUM: 11.4 mg/dL — AB (ref 8.9–10.3)
CO2: 23 mmol/L (ref 22–32)
CO2: 22 mmol/L (ref 22–32)
CREATININE: 1.94 mg/dL — AB (ref 0.61–1.24)
CREATININE: 2.02 mg/dL — AB (ref 0.61–1.24)
Calcium: 11.3 mg/dL — ABNORMAL HIGH (ref 8.9–10.3)
Chloride: >130 mmol/L (ref 98–111)
GFR calc Af Amer: 42 mL/min — ABNORMAL LOW (ref >60)
GFR calc Af Amer: 40 mL/min — ABNORMAL LOW (ref >60)
GFR calc non Af Amer: 36 mL/min — ABNORMAL LOW (ref >60)
GFR calc non Af Amer: 35 mL/min — ABNORMAL LOW (ref >60)
Glucose, Bld: 266 mg/dL — ABNORMAL HIGH (ref 70–99)
Glucose, Bld: 276 mg/dL — ABNORMAL HIGH (ref 70–99)
Potassium: 3.7 mmol/L (ref 3.5–5.1)
Potassium: 4.1 mmol/L (ref 3.5–5.1)
Sodium: 163 mmol/L (ref 135–145)
Sodium: 162 mmol/L (ref 135–145)

## 2018-06-25 LAB — BLOOD GAS, ARTERIAL
Acid-base deficit: 0.3 mmol/L (ref 0.0–2.0)
BICARBONATE: 23.5 mmol/L (ref 20.0–28.0)
Drawn by: 35043
FIO2: 0.21
O2 SAT: 91 %
PATIENT TEMPERATURE: 102.8
PO2 ART: 62.1 mmHg — AB (ref 83.0–108.0)
pCO2 arterial: 39.9 mmHg (ref 32.0–48.0)
pH, Arterial: 7.399 (ref 7.350–7.450)

## 2018-06-25 LAB — SODIUM
SODIUM: 155 mmol/L — AB (ref 135–145)
Sodium: 157 mmol/L — ABNORMAL HIGH (ref 135–145)

## 2018-06-25 LAB — CBC
HEMATOCRIT: 40.5 % (ref 39.0–52.0)
Hemoglobin: 12.4 g/dL — ABNORMAL LOW (ref 13.0–17.0)
MCH: 30.4 pg (ref 26.0–34.0)
MCHC: 30.6 g/dL (ref 30.0–36.0)
MCV: 99.3 fL (ref 80.0–100.0)
NRBC: 0 % (ref 0.0–0.2)
Platelets: 275 10*3/uL (ref 150–400)
RBC: 4.08 MIL/uL — AB (ref 4.22–5.81)
RDW: 15.1 % (ref 11.5–15.5)
WBC: 12.3 10*3/uL — AB (ref 4.0–10.5)

## 2018-06-25 LAB — MAGNESIUM: MAGNESIUM: 2.3 mg/dL (ref 1.7–2.4)

## 2018-06-25 LAB — HEPATIC FUNCTION PANEL
ALK PHOS: 72 U/L (ref 38–126)
ALT: 16 U/L (ref 0–44)
AST: 25 U/L (ref 15–41)
Albumin: 3.5 g/dL (ref 3.5–5.0)
BILIRUBIN DIRECT: 0.2 mg/dL (ref 0.0–0.2)
BILIRUBIN TOTAL: 1.1 mg/dL (ref 0.3–1.2)
Indirect Bilirubin: 0.9 mg/dL (ref 0.3–0.9)
Total Protein: 6.9 g/dL (ref 6.5–8.1)

## 2018-06-25 LAB — GLUCOSE, CAPILLARY
Glucose-Capillary: 287 mg/dL — ABNORMAL HIGH (ref 70–99)
Glucose-Capillary: 218 mg/dL — ABNORMAL HIGH (ref 70–99)
Glucose-Capillary: 348 mg/dL — ABNORMAL HIGH (ref 70–99)
Glucose-Capillary: 239 mg/dL — ABNORMAL HIGH (ref 70–99)

## 2018-06-25 LAB — LITHIUM LEVEL: LITHIUM LVL: 0.62 mmol/L (ref 0.60–1.20)

## 2018-06-25 MED ORDER — ASPIRIN 300 MG RE SUPP
300.0000 mg | Freq: Once | RECTAL | Status: AC
  Administered 2018-06-25: 300 mg via RECTAL
  Filled 2018-06-25: qty 1

## 2018-06-25 MED ORDER — NITROGLYCERIN 2 % TD OINT
0.5000 [in_us] | TOPICAL_OINTMENT | Freq: Four times a day (QID) | TRANSDERMAL | Status: DC
  Administered 2018-06-25 – 2018-06-27 (×8): 0.5 [in_us] via TOPICAL
  Filled 2018-06-25: qty 30

## 2018-06-25 MED ORDER — METOPROLOL TARTRATE 5 MG/5ML IV SOLN
5.0000 mg | INTRAVENOUS | Status: DC | PRN
  Administered 2018-07-04 – 2018-07-05 (×2): 5 mg via INTRAVENOUS
  Filled 2018-06-25 (×3): qty 5

## 2018-06-25 MED ORDER — MAGNESIUM SULFATE IN D5W 1-5 GM/100ML-% IV SOLN
1.0000 g | Freq: Once | INTRAVENOUS | Status: AC
  Administered 2018-06-25: 1 g via INTRAVENOUS
  Filled 2018-06-25: qty 100

## 2018-06-25 MED ORDER — DEXTROSE 5 % IV SOLN
INTRAVENOUS | Status: DC
  Administered 2018-06-25: 14:00:00 via INTRAVENOUS

## 2018-06-25 MED ORDER — DEXTROSE 5 % IV SOLN
INTRAVENOUS | Status: DC
  Administered 2018-06-25 – 2018-07-01 (×15): via INTRAVENOUS

## 2018-06-25 MED ORDER — ACETAMINOPHEN 325 MG PO TABS
325.0000 mg | ORAL_TABLET | Freq: Once | ORAL | Status: AC
  Administered 2018-06-25: 325 mg via ORAL
  Filled 2018-06-25: qty 1

## 2018-06-25 MED ORDER — SODIUM CHLORIDE 0.9% FLUSH
10.0000 mL | Freq: Two times a day (BID) | INTRAVENOUS | Status: DC
  Administered 2018-06-26 – 2018-06-28 (×4): 10 mL
  Administered 2018-06-29: 20 mL
  Administered 2018-06-29 – 2018-06-30 (×3): 10 mL

## 2018-06-25 MED ORDER — DEXTROSE 5 % IV SOLN
INTRAVENOUS | Status: DC
  Administered 2018-06-25: 13:00:00 via INTRAVENOUS

## 2018-06-25 MED ORDER — PIPERACILLIN-TAZOBACTAM 3.375 G IVPB
3.3750 g | Freq: Three times a day (TID) | INTRAVENOUS | Status: DC
  Administered 2018-06-25 – 2018-06-26 (×3): 3.375 g via INTRAVENOUS
  Filled 2018-06-25 (×3): qty 50

## 2018-06-25 MED ORDER — METOPROLOL TARTRATE 50 MG PO TABS
50.0000 mg | ORAL_TABLET | Freq: Two times a day (BID) | ORAL | Status: DC
  Administered 2018-06-25 – 2018-06-27 (×5): 50 mg via ORAL
  Filled 2018-06-25 (×5): qty 1

## 2018-06-25 MED ORDER — SODIUM CHLORIDE 0.9% FLUSH: 10.0000 mL | INTRAVENOUS | Status: DC | PRN

## 2018-06-25 MED ORDER — ASPIRIN 81 MG PO CHEW
81.0000 mg | CHEWABLE_TABLET | Freq: Every day | ORAL | Status: DC
  Administered 2018-06-25 – 2018-07-03 (×8): 81 mg via ORAL
  Filled 2018-06-25 (×9): qty 1

## 2018-06-25 MED ORDER — ACETAMINOPHEN 325 MG PO TABS
650.0000 mg | ORAL_TABLET | Freq: Four times a day (QID) | ORAL | Status: DC | PRN
  Administered 2018-06-25 – 2018-06-29 (×7): 650 mg via ORAL
  Filled 2018-06-25 (×7): qty 2

## 2018-06-25 NOTE — Progress Notes (Signed)
Patient transferred from 3 mdiwest to 5W 15. Disoriented x 3, Confused, agitated and hallucinating. Four point soft restrain maintained as well as a Air cabin crew. No acute distress or any s/s of pain. Will continue to monitor.

## 2018-06-25 NOTE — Progress Notes (Signed)
Pharmacy Antibiotic Note  Frank Cox is a 57 y.o. male admitted on 06/27/2018 with lithium toxicity requiring emergent HD. Now with concern for aspiration PNA and pharmacy has been consulted for Zosyn dosing.  Plan: - Start Zosyn 3.375g IV every 8 hours (infused over 4 hours) - Will continue to follow renal function, culture results, LOT, and antibiotic de-escalation plans      Temp (24hrs), Avg:100.8 F (38.2 C), Min:99 F (37.2 C), Max:103.1 F (39.5 C)  Recent Labs  Lab 06/20/18 1028 07/01/2018 0240 06/24/2018 0835 06/28/2018 1158 06/19/2018 2050 06/24/18 0258 06/24/18 1007 06/25/18 0612  WBC 12.4* 9.5  --  9.7  --   --   --  12.3*  CREATININE 1.67* 1.75* 1.66* 1.65* 1.20 1.30*  1.31* 1.56*  --   LATICACIDVEN  --   --  1.7 0.9  --   --   --   --     CrCl cannot be calculated (Unknown ideal weight.).    No Known Allergies  Antimicrobials this admission: Zosyn 10/20 >>  Dose adjustments this admission: n/a  Microbiology results: 10/18 MRSA PCR >> neg 10/20 UCx >> 10/20 BCx >>  Thank you for allowing pharmacy to be a part of this patient's care.  Alycia Rossetti, PharmD, BCPS Clinical Pharmacist Please check AMION for all [place] numbers 06/25/2018 12:09 PM

## 2018-06-25 NOTE — Progress Notes (Deleted)
Peripherally Inserted Central Catheter/Midline Placement  The IV Nurse has discussed with the patient and/or persons authorized to consent for the patient, the purpose of this procedure and the potential benefits and risks involved with this procedure.  The benefits include less needle sticks, lab draws from the catheter, and the patient may be discharged home with the catheter. Risks include, but not limited to, infection, bleeding, blood clot (thrombus formation), and puncture of an artery; nerve damage and irregular heartbeat and possibility to perform a PICC exchange if needed/ordered by physician.  Alternatives to this procedure were also discussed.  Bard Power PICC patient education guide, fact sheet on infection prevention and patient information card has been provided to patient /or left at bedside.    PICC/Midline Placement Documentation  PICC Single Lumen 06/25/18 PICC Right Cephalic 42 cm 2 cm (Active)  Indication for Insertion or Continuance of Line Home intravenous therapies (PICC only) 06/25/2018 12:01 PM  Exposed Catheter (cm) 2 cm 06/25/2018 12:01 PM  Site Assessment Clean;Intact;Dry 06/25/2018 12:01 PM  Line Status Flushed;Saline locked;Blood return noted 06/25/2018 12:01 PM  Dressing Type Transparent 06/25/2018 12:01 PM  Dressing Status Clean;Dry;Intact;Antimicrobial disc in place 06/25/2018 12:01 PM  Line Care Connections checked and tightened 06/25/2018 12:01 PM  Line Adjustment (NICU/IV Team Only) No 06/25/2018 12:01 PM  Dressing Intervention New dressing 06/25/2018 12:01 PM  Dressing Change Due 07/02/18 06/25/2018 12:01 PM       Rolena Infante 06/25/2018, 12:03 PM

## 2018-06-25 NOTE — Progress Notes (Signed)
Patient temp=101.9 and was given tylenol 650 mg oral.as needed. Temp even went up to 102.2 after the Tylenol. [place] NP notified via text. New order for Tylenol 325 mg x one dose received. Will administer and continue to monitor.

## 2018-06-25 NOTE — Progress Notes (Signed)
Subjective: Interval History: has no complaint, awake, agitated , confused.  Objective: Vital signs in last 24 hours: Temp:  [98 F (36.7 C)-101.7 F (38.7 C)] 99 F (37.2 C) (10/20 0500) Pulse Rate:  [41-106] 99 (10/20 0500) Resp:  [20-46] 20 (10/20 0500) BP: (103-174)/(62-118) 167/78 (10/20 0500) SpO2:  [35 %-100 %] 94 % (10/20 0500) Weight change:   Intake/Output from previous day: 10/19 0701 - 10/20 0700 In: 1858.9 [P.O.:720; I.V.:827.7; IV Piggyback:311.2] Out: 9650 [Urine:9650] Intake/Output this shift: No intake/output data recorded.  General appearance: cooperative, moderately obese, pale and confused Resp: rhonchi scattered Chest wall: R [place] cath Cardio: S1, S2 normal and systolic murmur: systolic ejection 2/6, crescendo and decrescendo at 2nd left intercostal space GI: obese, pos bs. soft, liver down 5 cm Extremities: edema 1-2+  Lab Results: Recent Labs    06/16/2018 1158 06/25/18 0612  WBC 9.7 12.3*  HGB 11.3* 12.4*  HCT 36.5* 40.5  PLT 284 275   BMET:  Recent Labs    06/24/18 0258 06/24/18 1007  NA 146*  145 148*  K 3.1*  3.1* 3.1*  CL 116*  115* 118*  CO2 26  26 23   GLUCOSE 177*  178* 241*  BUN <5*  <5* 5*  CREATININE 1.30*  1.31* 1.56*  CALCIUM 9.8  9.8 10.1   No results for input(s): PTH in the last 72 hours. Iron Studies: No results for input(s): IRON, TIBC, TRANSFERRIN, FERRITIN in the last 72 hours.  Studies/Results: US Renal  Result Date: 06/27/2018 CLINICAL DATA:  Acute kidney injury.  Hypertension and diabetes. EXAM: RENAL / URINARY TRACT ULTRASOUND COMPLETE COMPARISON:  CT 08/10/2016 FINDINGS: Right Kidney: Length: 13.4 cm, consistent with diabetic glomerular nephropathy. No hydronephrosis. Two renal cysts in the 2-3 cm size range. Parenchymal echogenicity is slightly increased. Left Kidney: Length: 16 0.0 cm, consistent with diabetic the Candis Schatz for optic the. No hydronephrosis. One cyst measuring up to 2 cm in diameter.  Parenchymal echogenicity is slightly increased. Bladder: Foley catheter in the bladder. IMPRESSION: Enlarged echogenic kidneys consistent with diabetic glomerular nephropathy. No obstruction. Bilateral benign appearing renal cysts. Electronically Signed   By: Nelson Chimes M.D.   On: 06/13/2018 17:54   Dg Chest Port 1 View  Result Date: 07/03/2018 CLINICAL DATA:  Central line placement EXAM: PORTABLE CHEST 1 VIEW COMPARISON:  05/10/2017 FINDINGS: Right subclavian central line with tip directed inferiorly, seen at the level of the upper SVC. Low volume chest with vascular congestion. There is asymmetric airspace disease in the right mid lung. Cardiopericardial enlargement. Vascular pedicle widening, accentuated by rotation IMPRESSION: 1. Right-sided central line with tip near the upper SVC. No evident pneumothorax. 2. Cardiomegaly and vascular congestion. 3. Asymmetric alveolar edema versus pneumonia on the right. Electronically Signed   By: Monte Fantasia M.D.   On: 06/15/2018 12:52    I have reviewed the patient's current medications.  Assessment/Plan: 1 Li toxic concern of conc ability but cannot judge now, as has obstruction.  Diuresing  Need to allow to diurese as has Na and water xs, but need to watch Na conc as may waste water with Li injury as well as post obstructive diuresis. Chem pending . May need more free water, not Na , as may not be able to drink due to confusion 2 AKI as above 3 Obstructive Uropathy needs to see Urology 4 Bipolar per Psych 5 Obesity 6 DM need to control so does not contrib to diuresis 7 HTn avoid ACEI, allow to diurese and  will get lower. P lower 1/2 NS, follow chem , may need more free water.    LOS: 2 days   Jeneen Rinks Mace Weinberg 06/25/2018,7:30 AM

## 2018-06-25 NOTE — Progress Notes (Signed)
RT NOTE:  Pt not on CPAP @ this time. Pt is confused and unable to answer questions directly. RT feels this would be unsafe to place mask on patient @ this time. Sitter will notify RN if patient has apnea events or SpO2 drops while sleeping.

## 2018-06-25 NOTE — Progress Notes (Addendum)
PROGRESS NOTE                                                                                                                                                                                                             Patient Demographics:    Frank Cox, is a 57 y.o. male, DOB - 05-12-1961, RXV:[id number]  Admit date - 06/15/2018   Admitting Physician Kipp Brood, MD  Outpatient Primary MD for the patient is Antony Contras, MD  LOS - 2  Chief Complaint  Patient presents with  . Altered Mental Status  . Fall       Brief Narrative this is a 57 year old morbidly obese Caucasian gentleman with known history of DM type II, bipolar disorder on lithium, hypertension, prostate cancer, OSA on CPAP, who was admitted to the hospital on 06/26/2018 for accidental lithium toxicity causing encephalopathy, acute renal failure and hypernatremia.  He was under the care of critical care and was transferred to hospitalist service on 06/25/2018.  This morning when I saw the patient he has a temp of around 103, he appears encephalopathic, unable to answer questions or follow commands, he appears very dehydrated, so far renal is on board and he has been undergoing dialysis treatments for lithium toxicity.  He will be transferred to stepdown immediately and monitored there.   Subjective:    Patsi Sears today in bed appears weak and dehydrated, unable to follow commands or answer questions.   Assessment  & Plan :     1.  Lithium toxicity with acute toxic encephalopathy, hypernatremia (DI) - rule out seizures, will get EEG, monitor in stepdown, keep him n.p.o. except medications, speech eval.  Renal on board.  Has undergone one dialysis treatment so far.  Seen by psych no further lithium.  This likely was accidental.  2.  Toxic encephalopathy.  Due to #1 above, head CT unremarkable, no focal deficits, rule out seizure get EEG, check ABG as well as he has obstructive sleep apnea and he did not  have CPAP last night.  3.  Morbid obesity with obstructive sleep apnea.  Unfortunately he missed CPAP last night, ABG, CPAP from tonight.  Follow with ECP for weight loss.  4.  Hypernatremia.  Half-normal saline for now and monitor.  Discussed with nephrology.  Likely developing DI, if sodium continues to rise may require D5W.  5.  BiPolar disorder.  For now no further lithium, psych following.  6.  Bladder outflow obstruction.  History of prostate cancer, currently  has Foley, Flomax along with Proscar on board.  He will likely go home with Foley follow with Dr. Alyson Ingles post discharge.  He was consulted by previous team.  7. HTN.  Placed on Lopressor, Nitropaste and PRN IV hydralazine. Check Baseline echocardiogram.  8.  Fever morning of 06/25/2018.  Rule out aspiration, check chest x-ray, does have chronic edema in both lower extremities and a small chance he could have cellulitis, will obtain blood cultures, urine culture, his MRSA negative PCR.  Will place him on Zosyn for now, monitor cultures and follow clinically.  9. DM type II.  On Lantus and sliding scale.  Will monitor.  CBG (last 3)  Recent Labs    06/24/18 1621 06/24/18 2115 06/25/18 0927  GLUCAP 249* 286* 287*      Family Communication  :  None avaible  Code Status :  Full  Disposition Plan  :  Stepdown  Consults  :  Renal, Psych, PCCM  Procedures  :    CT head.  Nonacute.  Renal ultrasound - no obstruction.  Foley in place.    DVT Prophylaxis  :  Heparin  Lab Results  Component Value Date   PLT 275 06/25/2018    Diet :  Diet Order            DIET SOFT Room service appropriate? Yes; Fluid consistency: Thin  Diet effective now               Inpatient Medications Scheduled Meds: . aspirin  81 mg Oral Daily  . chlorhexidine  15 mL Mouth Rinse BID  . Chlorhexidine Gluconate Cloth  6 each Topical Q0600  . finasteride  5 mg Oral Daily  . heparin  5,000 Units Subcutaneous Q8H  . insulin aspart   0-20 Units Subcutaneous TID WC  . insulin aspart  0-5 Units Subcutaneous QHS  . insulin glargine  10 Units Subcutaneous Daily  . mouth rinse  15 mL Mouth Rinse q12n4p  . metoprolol tartrate  50 mg Oral BID  . nitroGLYCERIN  0.5 inch Topical Q6H  . nystatin   Topical BID  . tamsulosin  0.8 mg Oral Daily   Continuous Infusions: . sodium chloride 50 mL/hr at 06/25/18 0810  . sodium chloride    . sodium chloride    . magnesium sulfate 1 - 4 g bolus IVPB     PRN Meds:.sodium chloride, sodium chloride, alteplase, haloperidol lactate, heparin, heparin, hydrALAZINE, lidocaine (PF), lidocaine-prilocaine, metoprolol tartrate, pentafluoroprop-tetrafluoroeth  Antibiotics  :   Anti-infectives (From admission, onward)   None          Objective:   Vitals:   06/24/18 2200 06/25/18 0017 06/25/18 0500 06/25/18 1135  BP:  133/62 (!) 167/78 (!) 175/82  Pulse: (!) 101 (!) 102 99 (!) 105  Resp: (!) 26  20 (!) 24  Temp:  (!) 100.9 F (38.3 C) 99 F (37.2 C) (!) 103.1 F (39.5 C)  TempSrc:   Oral   SpO2: 91% 94% 94% 92%    Wt Readings from Last 3 Encounters:  06/20/18 (!) 149.7 kg  07/22/17 (!) 165.6 kg  05/10/17 (!) 161.9 kg     Intake/Output Summary (Last 24 hours) at 06/25/2018 1154 Last data filed at 06/25/2018 0700 Gross per 24 hour  Intake 2032.96 ml  Output 7700 ml  Net -5667.04 ml     Physical Exam  Awake but deliriuos, No new F.N deficits, unable to follow commands, dry oral mucosa [place].AT,PERRAL Supple Neck,No JVD, No cervical  lymphadenopathy appriciated.  Symmetrical Chest wall movement, Good air movement bilaterally, CTAB RRR,No Gallops,Rubs or new Murmurs, No Parasternal Heave +ve B.Sounds, Abd Soft, No tenderness, No organomegaly appriciated, No rebound - guarding or rigidity. No Cyanosis, Clubbing or edema, Lower extremity venous erythema likely chronic    Data Review:    CBC Recent Labs  Lab 06/20/18 1028 07/05/2018 0240 06/21/2018 1158 06/25/18 0612    WBC 12.4* 9.5 9.7 12.3*  HGB 12.9* 12.3* 11.3* 12.4*  HCT 41.0 38.5* 36.5* 40.5  PLT 279 298 284 275  MCV 97.9 96.3 97.9 99.3  MCH 30.8 30.8 30.3 30.4  MCHC 31.5 31.9 31.0 30.6  RDW 14.2 14.1 14.4 15.1  LYMPHSABS 1.3  --   --   --   MONOABS 0.8  --   --   --   EOSABS 0.2  --   --   --   BASOSABS 0.0  --   --   --     Chemistries  Recent Labs  Lab 06/20/18 1028 06/26/2018 0240 06/17/2018 0835 06/12/2018 1158 06/06/2018 1159 06/30/2018 2050 06/24/18 0258 06/24/18 1007  NA 144 140 143 146*  --  143 146*  145 148*  K 3.8 3.1* 3.1* 3.6  --  2.9* 3.1*  3.1* 3.1*  CL 115* 112* 117* 124*  --  113* 116*  115* 118*  CO2 23 21* 21* 21*  --  27 26  26 23   GLUCOSE 261* 214* 192* 257*  --  204* 177*  178* 241*  BUN 12 8 7 7   --  <5* <5*  <5* 5*  CREATININE 1.67* 1.75* 1.66* 1.65*  --  1.20 1.30*  1.31* 1.56*  CALCIUM 11.7* 11.0* 10.7* 10.6*  --  9.1 9.8  9.8 10.1  MG  --   --  2.2  --   --   --   --   --   AST 14* 17  --   --  11*  --   --   --   ALT 14 16  --   --  13  --   --   --   ALKPHOS 78 67  --   --  61  --   --   --   BILITOT 1.4* 0.6  --   --  0.8  --   --   --    ------------------------------------------------------------------------------------------------------------------ No results for input(s): CHOL, HDL, LDLCALC, TRIG, CHOLHDL, LDLDIRECT in the last 72 hours.  Lab Results  Component Value Date   HGBA1C 5.5 07/19/2017   ------------------------------------------------------------------------------------------------------------------ Recent Labs    06/15/2018 1158  TSH 2.691   ------------------------------------------------------------------------------------------------------------------ No results for input(s): VITAMINB12, FOLATE, FERRITIN, TIBC, IRON, RETICCTPCT in the last 72 hours.  Coagulation profile No results for input(s): INR, PROTIME in the last 168 hours.  No results for input(s): DDIMER in the last 72 hours.  Cardiac Enzymes No results for  input(s): CKMB, TROPONINI, MYOGLOBIN in the last 168 hours.  Invalid input(s): CK ------------------------------------------------------------------------------------------------------------------    Component Value Date/Time   BNP 30.7 05/10/2017 1144    Micro Results Recent Results (from the past 240 hour(s))  MRSA PCR Screening     Status: None   Collection Time: 06/30/2018 12:02 PM  Result Value Ref Range Status   MRSA by PCR NEGATIVE NEGATIVE Final    Comment:        The GeneXpert MRSA Assay (FDA approved for NASAL specimens only), is one component of a comprehensive MRSA colonization surveillance program. It  is not intended to diagnose MRSA infection nor to guide or monitor treatment for MRSA infections. Performed at [facility], [place] [address], [place], [place] [address]     Radiology Reports  Ct Head Wo Contrast  Result Date: 06/20/2018 CLINICAL DATA:  dizziness for " a few weeks" Per EMS : Pt has had 3 unwitnessed falls in the last 2 weeks. Pt lives with father at home. Pt is oriented to person and place. Pt disoriented to year and situation "Pt confused EXAM: CT HEAD WITHOUT CONTRAST TECHNIQUE: Contiguous axial images were obtained from the base of the skull through the vertex without intravenous contrast. COMPARISON:  None. FINDINGS: Brain: No evidence of acute infarction, hemorrhage, hydrocephalus, extra-axial collection or mass lesion/mass effect. Vascular: No hyperdense vessel or unexpected calcification. Skull: Normal. Negative for fracture or focal lesion. Sinuses/Orbits: No acute finding. Other: None IMPRESSION: Negative Electronically Signed   By: Lucrezia Europe M.D.   On: 06/20/2018 11:37   US Renal  Result Date: 06/17/2018 CLINICAL DATA:  Acute kidney injury.  Hypertension and diabetes. EXAM: RENAL / URINARY TRACT ULTRASOUND COMPLETE COMPARISON:  CT 08/10/2016 FINDINGS: Right Kidney: Length: 13.4 cm, consistent with diabetic glomerular nephropathy. No  hydronephrosis. Two renal cysts in the 2-3 cm size range. Parenchymal echogenicity is slightly increased. Left Kidney: Length: 16 0.0 cm, consistent with diabetic the Candis Schatz for optic the. No hydronephrosis. One cyst measuring up to 2 cm in diameter. Parenchymal echogenicity is slightly increased. Bladder: Foley catheter in the bladder. IMPRESSION: Enlarged echogenic kidneys consistent with diabetic glomerular nephropathy. No obstruction. Bilateral benign appearing renal cysts. Electronically Signed   By: Nelson Chimes M.D.   On: 06/18/2018 17:54   Dg Chest Port 1 View  Result Date: 06/14/2018 CLINICAL DATA:  Central line placement EXAM: PORTABLE CHEST 1 VIEW COMPARISON:  05/10/2017 FINDINGS: Right subclavian central line with tip directed inferiorly, seen at the level of the upper SVC. Low volume chest with vascular congestion. There is asymmetric airspace disease in the right mid lung. Cardiopericardial enlargement. Vascular pedicle widening, accentuated by rotation IMPRESSION: 1. Right-sided central line with tip near the upper SVC. No evident pneumothorax. 2. Cardiomegaly and vascular congestion. 3. Asymmetric alveolar edema versus pneumonia on the right. Electronically Signed   By: Monte Fantasia M.D.   On: 06/30/2018 12:52    Time Spent in minutes  30   Lala Lund M.D on 06/25/2018 at 11:54 AM  To page go to [email or web address] - password [facility]

## 2018-06-26 ENCOUNTER — Inpatient Hospital Stay (HOSPITAL_COMMUNITY): Payer: 59

## 2018-06-26 DIAGNOSIS — I5030 Unspecified diastolic (congestive) heart failure: Secondary | ICD-10-CM

## 2018-06-26 DIAGNOSIS — T56894A Toxic effect of other metals, undetermined, initial encounter: Secondary | ICD-10-CM

## 2018-06-26 LAB — URINE CULTURE: CULTURE: NO GROWTH

## 2018-06-26 LAB — BASIC METABOLIC PANEL
ANION GAP: 10 (ref 5–15)
BUN: 10 mg/dL (ref 6–20)
CHLORIDE: 124 mmol/L — AB (ref 98–111)
CO2: 21 mmol/L — ABNORMAL LOW (ref 22–32)
Calcium: 10.9 mg/dL — ABNORMAL HIGH (ref 8.9–10.3)
Creatinine, Ser: 2.17 mg/dL — ABNORMAL HIGH (ref 0.61–1.24)
GFR calc Af Amer: 37 mL/min — ABNORMAL LOW (ref >60)
GFR calc non Af Amer: 32 mL/min — ABNORMAL LOW (ref >60)
Glucose, Bld: 345 mg/dL — ABNORMAL HIGH (ref 70–99)
POTASSIUM: 3.6 mmol/L (ref 3.5–5.1)
Sodium: 155 mmol/L — ABNORMAL HIGH (ref 135–145)

## 2018-06-26 LAB — CBC
HCT: 39.2 % (ref 39.0–52.0)
Hemoglobin: 11.2 g/dL — ABNORMAL LOW (ref 13.0–17.0)
MCH: 29.5 pg (ref 26.0–34.0)
MCHC: 28.6 g/dL — AB (ref 30.0–36.0)
MCV: 103.2 fL — AB (ref 80.0–100.0)
PLATELETS: 247 10*3/uL (ref 150–400)
RBC: 3.8 MIL/uL — ABNORMAL LOW (ref 4.22–5.81)
RDW: 15.9 % — AB (ref 11.5–15.5)
WBC: 11.1 10*3/uL — ABNORMAL HIGH (ref 4.0–10.5)
nRBC: 0 % (ref 0.0–0.2)

## 2018-06-26 LAB — RENAL FUNCTION PANEL
Albumin: 3.1 g/dL — ABNORMAL LOW (ref 3.5–5.0)
Anion gap: 6 (ref 5–15)
BUN: 10 mg/dL (ref 6–20)
CALCIUM: 10.2 mg/dL (ref 8.9–10.3)
CO2: 23 mmol/L (ref 22–32)
Chloride: 126 mmol/L — ABNORMAL HIGH (ref 98–111)
Creatinine, Ser: 2.24 mg/dL — ABNORMAL HIGH (ref 0.61–1.24)
GFR calc Af Amer: 35 mL/min — ABNORMAL LOW (ref >60)
GFR calc non Af Amer: 31 mL/min — ABNORMAL LOW (ref >60)
GLUCOSE: 394 mg/dL — AB (ref 70–99)
Phosphorus: 3.5 mg/dL (ref 2.5–4.6)
Potassium: 3.4 mmol/L — ABNORMAL LOW (ref 3.5–5.1)
SODIUM: 155 mmol/L — AB (ref 135–145)

## 2018-06-26 LAB — GLUCOSE, CAPILLARY
GLUCOSE-CAPILLARY: 361 mg/dL — AB (ref 70–99)
GLUCOSE-CAPILLARY: 295 mg/dL — AB (ref 70–99)
Glucose-Capillary: 379 mg/dL — ABNORMAL HIGH (ref 70–99)

## 2018-06-26 LAB — MAGNESIUM: MAGNESIUM: 2.2 mg/dL (ref 1.7–2.4)

## 2018-06-26 LAB — SODIUM
Sodium: 153 mmol/L — ABNORMAL HIGH (ref 135–145)
Sodium: 154 mmol/L — ABNORMAL HIGH (ref 135–145)

## 2018-06-26 LAB — LITHIUM LEVEL: Lithium Lvl: 0.47 mmol/L — ABNORMAL LOW (ref 0.60–1.20)

## 2018-06-26 LAB — ECHOCARDIOGRAM COMPLETE

## 2018-06-26 MED ORDER — POTASSIUM CHLORIDE CRYS ER 20 MEQ PO TBCR
40.0000 meq | EXTENDED_RELEASE_TABLET | Freq: Once | ORAL | Status: AC
  Administered 2018-06-26: 40 meq via ORAL
  Filled 2018-06-26: qty 2

## 2018-06-26 MED ORDER — ACETAMINOPHEN 325 MG PO TABS
650.0000 mg | ORAL_TABLET | Freq: Once | ORAL | Status: AC
  Administered 2018-06-26: 650 mg via ORAL
  Filled 2018-06-26: qty 2

## 2018-06-26 MED ORDER — ALTEPLASE 2 MG IJ SOLR
2.0000 mg | Freq: Once | INTRAMUSCULAR | Status: AC
  Administered 2018-06-26: 2 mg
  Filled 2018-06-26 (×2): qty 2

## 2018-06-26 MED ORDER — SODIUM CHLORIDE 0.9 % IV SOLN
1.0000 g | INTRAVENOUS | Status: DC
  Administered 2018-06-26 – 2018-06-28 (×3): 1 g via INTRAVENOUS
  Filled 2018-06-26 (×3): qty 10

## 2018-06-26 MED ORDER — INSULIN GLARGINE 100 UNIT/ML ~~LOC~~ SOLN
10.0000 [IU] | Freq: Two times a day (BID) | SUBCUTANEOUS | Status: DC
  Administered 2018-06-26 – 2018-06-27 (×3): 10 [IU] via SUBCUTANEOUS
  Filled 2018-06-26 (×3): qty 0.1

## 2018-06-26 MED ORDER — ASPIRIN 300 MG RE SUPP
300.0000 mg | Freq: Once | RECTAL | Status: AC
  Administered 2018-06-26: 300 mg via RECTAL
  Filled 2018-06-26: qty 1

## 2018-06-26 MED ORDER — POLYETHYLENE GLYCOL 3350 17 G PO PACK
17.0000 g | PACK | Freq: Two times a day (BID) | ORAL | Status: DC
  Administered 2018-06-26 – 2018-07-01 (×6): 17 g via ORAL
  Filled 2018-06-26 (×8): qty 1

## 2018-06-26 MED ORDER — BISACODYL 10 MG RE SUPP
10.0000 mg | Freq: Every day | RECTAL | Status: AC
  Administered 2018-06-26 – 2018-06-28 (×3): 10 mg via RECTAL
  Filled 2018-06-26 (×3): qty 1

## 2018-06-26 NOTE — Progress Notes (Signed)
Inpatient Diabetes Program Recommendations  AACE/ADA: New Consensus Statement on Inpatient Glycemic Control (2015)  Target Ranges:  Prepandial:   less than 140 mg/dL      Peak postprandial:   less than 180 mg/dL (1-2 hours)      Critically ill patients:  140 - 180 mg/dL   Lab Results  Component Value Date   GLUCAP 379 (H) 06/26/2018   HGBA1C 5.5 07/19/2017    Review of Glycemic Control Results for REINHART, SAULTERS (MRN [id number]) as of 06/26/2018 14:02  Ref. Range 06/25/2018 12:54 06/25/2018 17:02 06/25/2018 20:30 06/26/2018 12:29  Glucose-Capillary Latest Ref Range: 70 - 99 mg/dL 218 (H) 348 (H) 239 (H) 379 (H)   Diabetes history: Type 2 DM Outpatient Diabetes medications: not taking listed antidiabetic meds: Amaryl 4 mg bid, Trulicity, Actos, Invokamet Current orders for Inpatient glycemic control: Novolog 0-20 units TID, Novolog 0-5 units QHS, Lantus 10 units BID  Inpatient Diabetes Program Recommendations:    Noted increase to Lantus. Given blood glucose trends near 400's, would continue to consider increasing Lantus to 15 units BID (149 kg x 0.2).   Also, last A1C was from 2018, consider adding A1C?  Thanks, Bronson Curb, MSN, RNC-OB Diabetes Coordinator [phone] (8a-5p)

## 2018-06-26 NOTE — Progress Notes (Signed)
PROGRESS NOTE                                                                                                                                                                                                             Patient Demographics:    Frank Cox, is a 57 y.o. male, DOB - 25-Jul-1961, IOX:[id number]  Admit date - 06/15/2018   Admitting Physician Kipp Brood, MD  Outpatient Primary MD for the patient is Antony Contras, MD  LOS - 3  Chief Complaint  Patient presents with  . Altered Mental Status  . Fall       Brief Narrative this is a 57 year old morbidly obese Caucasian gentleman with known history of DM type II, bipolar disorder on lithium, hypertension, prostate cancer, OSA on CPAP, who was admitted to the hospital on 06/13/2018 for accidental lithium toxicity causing encephalopathy, acute renal failure and hypernatremia.  He was under the care of critical care and was transferred to hospitalist service on 06/25/2018.  This morning when I saw the patient he has a temp of around 103, he appears encephalopathic, unable to answer questions or follow commands, he appears very dehydrated, so far renal is on board and he has been undergoing dialysis treatments for lithium toxicity.  He will be transferred to stepdown immediately and monitored there.   Subjective:   Patient in bed appears to be more comfortable today, denies any headache chest or abdominal pain, overall appears comfortable and improved from yesterday but still quite confused.   Assessment  & Plan :     1.  Lithium toxicity with acute toxic encephalopathy, hypernatremia (DI) - likely is combination of toxic encephalopathy from lithium along with severe metabolic derangement caused by DI, rule out seizures, EEG ordered and pending, renal on board, continue IV D5 aggressive hydration, continue supportive care and monitor.  Clinically encephalopathy mildly improved on 26/83/4196.  2.  Metabolic  encephalopathy.  Due to #1 above, head CT unremarkable, no focal deficits, rule out seizure get EEG, stable ABG and ammonia levels.  Mildly improved on 06/26/2018.  3.  Morbid obesity with obstructive sleep apnea.  Nighttime scheduled CPAP.  4.  Dehydration, ARF and severe hypernatremia.  Due to DI caused by lithium toxicity, renal on board aggressive D5W IV, with some element of lateral outlet obstruction.  Foley was placed on 06/24/2018 which will be continued.  5.  BiPolar disorder.  For now no further lithium, psych following.  6.  Bladder outflow obstruction.  History of  prostate cancer, currently has Foley, Flomax along with Proscar on board.  He will likely go home with Foley follow with Dr. Alyson Ingles post discharge.  He was consulted by previous team.  7. HTN.  Placed on Lopressor, Nitropaste and PRN IV hydralazine. Check Baseline echocardiogram.  8.  UTI with Fever morning of 06/25/2018.  Currently on Zosyn will taper down to Rocephin, chest x-ray clear, cultures pending and monitor, fortunately cannot remove Foley as he developed urinary shin with ARF.  9. DM type II.  On Lantus and sliding scale.  Will increase Lantus to 10 twice daily on 06/26/2018 and will continue to monitor.  CBG (last 3)  Recent Labs    06/25/18 1254 06/25/18 1702 06/25/18 2030  GLUCAP 218* 348* 239*      Family Communication  :  None avaible  Code Status :  Full  Disposition Plan  :  Stepdown  Consults  :  Renal, Psych, PCCM  Procedures  :    TTE  CT head.  Nonacute.  Renal ultrasound - no obstruction.  Foley in place.  Foley catheter placement 06/24/2018    DVT Prophylaxis  :  Heparin  Lab Results  Component Value Date   PLT 247 06/26/2018    Diet :  Diet Order            DIET SOFT Room service appropriate? Yes; Fluid consistency: Thin  Diet effective now               Inpatient Medications Scheduled Meds: . aspirin  81 mg Oral Daily  . chlorhexidine  15 mL Mouth  Rinse BID  . finasteride  5 mg Oral Daily  . heparin  5,000 Units Subcutaneous Q8H  . insulin aspart  0-20 Units Subcutaneous TID WC  . insulin aspart  0-5 Units Subcutaneous QHS  . insulin glargine  10 Units Subcutaneous Daily  . mouth rinse  15 mL Mouth Rinse q12n4p  . metoprolol tartrate  50 mg Oral BID  . nitroGLYCERIN  0.5 inch Topical Q6H  . nystatin   Topical BID  . potassium chloride  40 mEq Oral Once  . sodium chloride flush  10-40 mL Intracatheter Q12H  . tamsulosin  0.8 mg Oral Daily   Continuous Infusions: . dextrose 200 mL/hr at 06/26/18 0249  . piperacillin-tazobactam (ZOSYN)  IV 3.375 g (06/26/18 0610)   PRN Meds:.acetaminophen, haloperidol lactate, hydrALAZINE, metoprolol tartrate  Antibiotics  :   Anti-infectives (From admission, onward)   Start     Dose/Rate Route Frequency Ordered Stop   06/25/18 1300  piperacillin-tazobactam (ZOSYN) IVPB 3.375 g     3.375 g 12.5 mL/hr over 240 Minutes Intravenous Every 8 hours 06/25/18 1210            Objective:   Vitals:   06/26/18 0408 06/26/18 0409 06/26/18 0448 06/26/18 0800  BP: (!) 156/71  (!) 142/79 (!) 152/87  Pulse: 84  81 87  Resp: (!) 27  (!) 27 (!) 24  Temp:  (!) 100.6 F (38.1 C)  99.7 F (37.6 C)  TempSrc:  Oral  Oral  SpO2: 98%  96% 100%    Wt Readings from Last 3 Encounters:  06/20/18 (!) 149.7 kg  07/22/17 (!) 165.6 kg  05/10/17 (!) 161.9 kg     Intake/Output Summary (Last 24 hours) at 06/26/2018 0851 Last data filed at 06/26/2018 0629 Gross per 24 hour  Intake 3760 ml  Output 4420 ml  Net -660 ml     Physical  Exam  Awake slightly more alert than yesterday but still overall confused, able to move all 4 extremities and follow basic commands [place].AT,PERRAL Supple Neck,No JVD, No cervical lymphadenopathy appriciated.  Symmetrical Chest wall movement, Good air movement bilaterally, CTAB RRR,No Gallops, Rubs or new Murmurs, No Parasternal Heave +ve B.Sounds, Abd Soft, No tenderness, No  organomegaly appriciated, No rebound - guarding or rigidity. No Cyanosis, Clubbing or edema, Lower extremity venous erythema likely chronic    Data Review:    CBC Recent Labs  Lab 06/20/18 1028 06/20/2018 0240 06/26/2018 1158 06/25/18 0612 06/26/18 0502  WBC 12.4* 9.5 9.7 12.3* 11.1*  HGB 12.9* 12.3* 11.3* 12.4* 11.2*  HCT 41.0 38.5* 36.5* 40.5 39.2  PLT 279 298 284 275 247  MCV 97.9 96.3 97.9 99.3 103.2*  MCH 30.8 30.8 30.3 30.4 29.5  MCHC 31.5 31.9 31.0 30.6 28.6*  RDW 14.2 14.1 14.4 15.1 15.9*  LYMPHSABS 1.3  --   --   --   --   MONOABS 0.8  --   --   --   --   EOSABS 0.2  --   --   --   --   BASOSABS 0.0  --   --   --   --     Chemistries  Recent Labs  Lab 06/20/18 1028 06/30/2018 0240 06/26/2018 0835  07/03/2018 1159  06/24/18 0258 06/24/18 1007 06/25/18 1140 06/25/18 1338 06/25/18 1749 06/25/18 2104 06/26/18 0057 06/26/18 0502  NA 144 140 143   < >  --    < > 146*  145 148* 163* 162* 157* 155* 153* 155*  K 3.8 3.1* 3.1*   < >  --    < > 3.1*  3.1* 3.1* 3.7 4.1  --   --   --  3.4*  CL 115* 112* 117*   < >  --    < > 116*  115* 118* >130* >130*  --   --   --  126*  CO2 23 21* 21*   < >  --    < > 26  26 23 23 22   --   --   --  23  GLUCOSE 261* 214* 192*   < >  --    < > 177*  178* 241* 266* 276*  --   --   --  394*  BUN 12 8 7    < >  --    < > <5*  <5* 5* 8 8  --   --   --  10  CREATININE 1.67* 1.75* 1.66*   < >  --    < > 1.30*  1.31* 1.56* 1.94* 2.02*  --   --   --  2.24*  CALCIUM 11.7* 11.0* 10.7*   < >  --    < > 9.8  9.8 10.1 11.4* 11.3*  --   --   --  10.2  MG  --   --  2.2  --   --   --   --   --  2.3  --   --   --   --  2.2  AST 14* 17  --   --  11*  --   --   --  25  --   --   --   --   --   ALT 14 16  --   --  13  --   --   --  16  --   --   --   --   --  ALKPHOS 78 67  --   --  61  --   --   --  72  --   --   --   --   --   BILITOT 1.4* 0.6  --   --  0.8  --   --   --  1.1  --   --   --   --   --    < > = values in this interval not displayed.    ------------------------------------------------------------------------------------------------------------------ No results for input(s): CHOL, HDL, LDLCALC, TRIG, CHOLHDL, LDLDIRECT in the last 72 hours.  Lab Results  Component Value Date   HGBA1C 5.5 07/19/2017   ------------------------------------------------------------------------------------------------------------------ Recent Labs    07/06/2018 1158  TSH 2.691   ------------------------------------------------------------------------------------------------------------------ No results for input(s): VITAMINB12, FOLATE, FERRITIN, TIBC, IRON, RETICCTPCT in the last 72 hours.  Coagulation profile No results for input(s): INR, PROTIME in the last 168 hours.  No results for input(s): DDIMER in the last 72 hours.  Cardiac Enzymes No results for input(s): CKMB, TROPONINI, MYOGLOBIN in the last 168 hours.  Invalid input(s): CK ------------------------------------------------------------------------------------------------------------------    Component Value Date/Time   BNP 30.7 05/10/2017 1144    Micro Results Recent Results (from the past 240 hour(s))  MRSA PCR Screening     Status: None   Collection Time: 06/21/2018 12:02 PM  Result Value Ref Range Status   MRSA by PCR NEGATIVE NEGATIVE Final    Comment:        The GeneXpert MRSA Assay (FDA approved for NASAL specimens only), is one component of a comprehensive MRSA colonization surveillance program. It is not intended to diagnose MRSA infection nor to guide or monitor treatment for MRSA infections. Performed at [facility], [place] [address], [place], [place] [address]   Culture, blood (routine x 2)     Status: None (Preliminary result)   Collection Time: 06/25/18 11:39 AM  Result Value Ref Range Status   Specimen Description BLOOD LEFT ARM  Final   Special Requests   Final    BOTTLES DRAWN AEROBIC ONLY Blood Culture adequate volume   Culture   Final     NO GROWTH < 24 HOURS Performed at [facility], [place] [address] [place], [place] [address]    Report Status PENDING  Incomplete  Culture, blood (routine x 2)     Status: None (Preliminary result)   Collection Time: 06/25/18 11:45 AM  Result Value Ref Range Status   Specimen Description BLOOD RIGHT HAND  Final   Special Requests   Final    BOTTLES DRAWN AEROBIC ONLY Blood Culture adequate volume   Culture   Final    NO GROWTH < 24 HOURS Performed at [facility], [place] [address], [place], [place] [address]    Report Status PENDING  Incomplete    Radiology Reports  Ct Head Wo Contrast  Result Date: 06/20/2018 CLINICAL DATA:  dizziness for " a few weeks" Per EMS : Pt has had 3 unwitnessed falls in the last 2 weeks. Pt lives with father at home. Pt is oriented to person and place. Pt disoriented to year and situation "Pt confused EXAM: CT HEAD WITHOUT CONTRAST TECHNIQUE: Contiguous axial images were obtained from the base of the skull through the vertex without intravenous contrast. COMPARISON:  None. FINDINGS: Brain: No evidence of acute infarction, hemorrhage, hydrocephalus, extra-axial collection or mass lesion/mass effect. Vascular: No hyperdense vessel or unexpected calcification. Skull: Normal. Negative for fracture or focal lesion. Sinuses/Orbits:  No acute finding. Other: None IMPRESSION: Negative Electronically Signed   By: Lucrezia Europe M.D.   On: 06/20/2018 11:37   US Renal  Result Date: 06/13/2018 CLINICAL DATA:  Acute kidney injury.  Hypertension and diabetes. EXAM: RENAL / URINARY TRACT ULTRASOUND COMPLETE COMPARISON:  CT 08/10/2016 FINDINGS: Right Kidney: Length: 13.4 cm, consistent with diabetic glomerular nephropathy. No hydronephrosis. Two renal cysts in the 2-3 cm size range. Parenchymal echogenicity is slightly increased. Left Kidney: Length: 16 0.0 cm, consistent with diabetic the Candis Schatz for optic the. No hydronephrosis. One cyst measuring up to 2 cm in  diameter. Parenchymal echogenicity is slightly increased. Bladder: Foley catheter in the bladder. IMPRESSION: Enlarged echogenic kidneys consistent with diabetic glomerular nephropathy. No obstruction. Bilateral benign appearing renal cysts. Electronically Signed   By: Nelson Chimes M.D.   On: 06/30/2018 17:54   Dg Chest Port 1 View  Result Date: 06/25/2018 CLINICAL DATA:  Shortness of breath.  Fever this morning. EXAM: PORTABLE CHEST 1 VIEW COMPARISON:  Single-view of the chest 06/15/2018. PA and lateral chest 05/10/2017. FINDINGS: Right subclavian approach central venous catheter has its tip in the right brachiocephalic vein, unchanged. There is cardiomegaly and vascular congestion. No consolidative process, pneumothorax or effusion. IMPRESSION: No acute disease. Cardiomegaly and vascular congestion. Right subclavian central venous catheter tip projects in the right brachiocephalic vein. Electronically Signed   By: Inge Rise M.D.   On: 06/25/2018 12:58   Dg Chest Port 1 View  Result Date: 06/07/2018 CLINICAL DATA:  Central line placement EXAM: PORTABLE CHEST 1 VIEW COMPARISON:  05/10/2017 FINDINGS: Right subclavian central line with tip directed inferiorly, seen at the level of the upper SVC. Low volume chest with vascular congestion. There is asymmetric airspace disease in the right mid lung. Cardiopericardial enlargement. Vascular pedicle widening, accentuated by rotation IMPRESSION: 1. Right-sided central line with tip near the upper SVC. No evident pneumothorax. 2. Cardiomegaly and vascular congestion. 3. Asymmetric alveolar edema versus pneumonia on the right. Electronically Signed   By: Monte Fantasia M.D.   On: 06/10/2018 12:52    Time Spent in minutes  30   Lala Lund M.D on 06/26/2018 at 8:51 AM  To page go to [email or web address] - password [facility]

## 2018-06-26 NOTE — Progress Notes (Signed)
Temp =100.6 this morning. Ice packs applied to  help bring his temp down.  Pt. Still confused, restless,  hallucinating and talking to himself a lot. Patient curses off and on per the safety sitter's report. Soft restraint  to bilateral wrist remain. Has not slept the whole night (dose off and on) No acute distress noted. Will continue to monitor.

## 2018-06-26 NOTE — Progress Notes (Signed)
Subjective:  UOP still plentiful but has decreased, sodium trending down but creatinine trending up last 24 hours- had fever and some low  BP for him - still getting D5 W at 200 per hour.  Some agitation reported but he seems better when I see him- restraints and sitter  Objective Vital signs in last 24 hours: Vitals:   06/26/18 0409 06/26/18 0448 06/26/18 0800 06/26/18 1130  BP:  (!) 142/79 (!) 152/87 (!) 163/79  Pulse:  81 87 67  Resp:  (!) 27 (!) 24 (!) 26  Temp: (!) 100.6 F (38.1 C)  99.7 F (37.6 C) (!) 100.8 F (38.2 C)  TempSrc: Oral  Oral Oral  SpO2:  96% 100% 98%   Weight change:   Intake/Output Summary (Last 24 hours) at 06/26/2018 1223 Last data filed at 06/26/2018 1100 Gross per 24 hour  Intake 3880 ml  Output 4920 ml  Net -1040 ml    Assessment/ Plan: Pt is a 57 y.o. yo male with bipolar on lithium who was admitted on 06/10/2018 with decreased MS- lithium level 2.37 and crt of 1.75  Assessment/Plan: 1. Renal- HD times one on 10/18 for lithium toxicity, level normalized.  Then discovered to have BOO s/p foley and then noted to have polyuria and hypernatremia.   2. HTN/vol- BUN and crt trending up last 24 hours- I think due to having close to 8 liters out (post obs diuresis vs DI from lithium) - UOP is decreasing and still getting d5w at 200 per hour so hopefully will settle out.  Only on metoprolol and prn hydralazine for now 3. Anemia- not an issue 4. ID-  Cultures and rocephin per primary  5. Hypernatremia - either due to BOO and post obstr diuresis vs DI-  Improving slowly- check BMP tonight   Louis Meckel    Labs: Basic Metabolic Panel: Recent Labs  Lab 06/24/18 0258  06/25/18 1140 06/25/18 1338  06/26/18 0057 06/26/18 0502 06/26/18 0848  NA 146*  145   < > 163* 162*   < > 153* 155* 154*  K 3.1*  3.1*   < > 3.7 4.1  --   --  3.4*  --   CL 116*  115*   < > >130* >130*  --   --  126*  --   CO2 26  26   < > 23 22  --   --  23  --    GLUCOSE 177*  178*   < > 266* 276*  --   --  394*  --   BUN <5*  <5*   < > 8 8  --   --  10  --   CREATININE 1.30*  1.31*   < > 1.94* 2.02*  --   --  2.24*  --   CALCIUM 9.8  9.8   < > 11.4* 11.3*  --   --  10.2  --   PHOS <1.0*  --   --   --   --   --  3.5  --    < > = values in this interval not displayed.   Liver Function Tests: Recent Labs  Lab 06/24/2018 0240 07/05/2018 1159 06/24/18 0258 06/25/18 1140 06/26/18 0502  AST 17 11*  --  25  --   ALT 16 13  --  16  --   ALKPHOS 67 61  --  72  --   BILITOT 0.6 0.8  --  1.1  --  PROT 6.5 5.9*  --  6.9  --   ALBUMIN 3.4* 3.1* 3.1* 3.5 3.1*   No results for input(s): LIPASE, AMYLASE in the last 168 hours. No results for input(s): AMMONIA in the last 168 hours. CBC: Recent Labs  Lab 06/20/18 1028 06/25/2018 0240 06/08/2018 1158 06/25/18 0612 06/26/18 0502  WBC 12.4* 9.5 9.7 12.3* 11.1*  NEUTROABS 10.1*  --   --   --   --   HGB 12.9* 12.3* 11.3* 12.4* 11.2*  HCT 41.0 38.5* 36.5* 40.5 39.2  MCV 97.9 96.3 97.9 99.3 103.2*  PLT 279 298 284 275 247   Cardiac Enzymes: Recent Labs  Lab 06/30/2018 0835  CKTOTAL 81   CBG: Recent Labs  Lab 06/24/18 2115 06/25/18 0927 06/25/18 1254 06/25/18 1702 06/25/18 2030  GLUCAP 286* 287* 218* 348* 239*    Iron Studies: No results for input(s): IRON, TIBC, TRANSFERRIN, FERRITIN in the last 72 hours. Studies/Results: Dg Chest Port 1 View  Result Date: 06/25/2018 CLINICAL DATA:  Shortness of breath.  Fever this morning. EXAM: PORTABLE CHEST 1 VIEW COMPARISON:  Single-view of the chest 06/24/2018. PA and lateral chest 05/10/2017. FINDINGS: Right subclavian approach central venous catheter has its tip in the right brachiocephalic vein, unchanged. There is cardiomegaly and vascular congestion. No consolidative process, pneumothorax or effusion. IMPRESSION: No acute disease. Cardiomegaly and vascular congestion. Right subclavian central venous catheter tip projects in the right  brachiocephalic vein. Electronically Signed   By: Inge Rise M.D.   On: 06/25/2018 12:58   Medications: Infusions: . cefTRIAXone (ROCEPHIN)  IV    . dextrose 200 mL/hr at 06/26/18 0249    Scheduled Medications: . aspirin  81 mg Oral Daily  . chlorhexidine  15 mL Mouth Rinse BID  . finasteride  5 mg Oral Daily  . heparin  5,000 Units Subcutaneous Q8H  . insulin aspart  0-20 Units Subcutaneous TID WC  . insulin aspart  0-5 Units Subcutaneous QHS  . insulin glargine  10 Units Subcutaneous BID  . mouth rinse  15 mL Mouth Rinse q12n4p  . metoprolol tartrate  50 mg Oral BID  . nitroGLYCERIN  0.5 inch Topical Q6H  . nystatin   Topical BID  . potassium chloride  40 mEq Oral Once  . sodium chloride flush  10-40 mL Intracatheter Q12H  . tamsulosin  0.8 mg Oral Daily    have reviewed scheduled and prn medications.  Physical Exam: General: alert, restraints , sitter- oral mucosa moist Heart: RRR Lungs: clear Abdomen: soft, non tender Extremities: no edema Dialysis Access: has right subclavian temp cath     06/26/2018,12:23 PM  LOS: 3 days

## 2018-06-26 NOTE — Progress Notes (Signed)
Pt. Started to get agitated.  Moving around in bed.  Kicking his blankets off and not making sense while talking.  Gave NT a hard time with getting a CBG.  Administered  Haldol 5mg .  Restraints still in place.

## 2018-06-26 NOTE — Progress Notes (Signed)
  Echocardiogram 2D Echocardiogram has been performed.  Frank Cox 06/26/2018, 8:30 AM

## 2018-06-26 NOTE — Progress Notes (Signed)
Pt. Temp 102.9.  HR is now in 110-120s. Rapid Response called and MD notified.  Rapid came to evaluate.  MD ordered PO Tylenol and ASA per rectum.

## 2018-06-26 NOTE — Procedures (Signed)
ELECTROENCEPHALOGRAM REPORT   Patient: Frank Cox       Room #: 3Z16R EEG No. ID: [id number] Age: 57 y.o.        Sex: male Referring Physician: Candiss Norse Report Date:  06/26/2018        Interpreting Physician: Alexis Goodell  History: Frank Cox is an 57 y.o. male with lithium toxicity  Medications:  ASA, Dulcolax, Rocephin, Proscar, Insulin, Lopressor, Miralax, Flomax  Conditions of Recording:  This is a 21 channel routine scalp EEG performed with bipolar and monopolar montages arranged in accordance to the international 10/20 system of electrode placement. One channel was dedicated to EKG recording.  The patient is in the lethargic state.  Description:  The background activity is low voltage and symmetric.  It consists of a fairly well, rhythmical theta activity at a frequency of 6Hz  that is diffusely distributed.  This is continuous throughout the recording.  At times bifrontally this activity will increase in amplitude.  There is no change in the patient clinically during these times.  Hyperventilation and intermittent photic stimulation were not performed.   IMPRESSION: This is an abnormal electroencephalogram secondary to general background slowing consistent with lithium toxicity.     Alexis Goodell, MD Neurology [phone] 06/26/2018, 3:17 PM

## 2018-06-26 NOTE — Progress Notes (Signed)
The NT reported milky discharge from the penis around 8 pm when giving him a bath. Noted small to moderate amount of the milky discharge this morning. Will page on call MD and notify. Patient is showing s/s of tiredness and appears sleepy this morning. Will re-assess the need for the restraint in an hour.

## 2018-06-26 NOTE — Progress Notes (Signed)
EEG Completed; Results Pending  

## 2018-06-26 NOTE — Progress Notes (Addendum)
RT spoke with Schorr NP about pts CPAP order. RT did not place pt on CPAP at this time. PT is currently in wrist restraints and has AMS. Made Schorr aware of pts chance of aspirating if he were to vomit while full face mask was on pt and his arms restrained and unable to remove mask. MD will address in the a.m. RT will continue to monitor.

## 2018-06-26 NOTE — Progress Notes (Signed)
CPAP is contraindicated due to use of soft wrist restraints. On call physician has been notified. Will continue to monitor.

## 2018-06-27 LAB — CBC
HCT: 41.8 % (ref 39.0–52.0)
Hemoglobin: 12.3 g/dL — ABNORMAL LOW (ref 13.0–17.0)
MCH: 30.3 pg (ref 26.0–34.0)
MCHC: 29.4 g/dL — AB (ref 30.0–36.0)
MCV: 103 fL — AB (ref 80.0–100.0)
NRBC: 0 % (ref 0.0–0.2)
PLATELETS: 241 10*3/uL (ref 150–400)
RBC: 4.06 MIL/uL — ABNORMAL LOW (ref 4.22–5.81)
RDW: 15.3 % (ref 11.5–15.5)
WBC: 11 10*3/uL — AB (ref 4.0–10.5)

## 2018-06-27 LAB — RENAL FUNCTION PANEL
ALBUMIN: 3.2 g/dL — AB (ref 3.5–5.0)
ANION GAP: 7 (ref 5–15)
BUN: 10 mg/dL (ref 6–20)
CALCIUM: 10.7 mg/dL — AB (ref 8.9–10.3)
CO2: 22 mmol/L (ref 22–32)
CREATININE: 2.08 mg/dL — AB (ref 0.61–1.24)
Chloride: 127 mmol/L — ABNORMAL HIGH (ref 98–111)
GFR, EST AFRICAN AMERICAN: 39 mL/min — AB (ref >60)
GFR, EST NON AFRICAN AMERICAN: 33 mL/min — AB (ref >60)
Glucose, Bld: 380 mg/dL — ABNORMAL HIGH (ref 70–99)
PHOSPHORUS: 2.9 mg/dL (ref 2.5–4.6)
Potassium: 3.6 mmol/L (ref 3.5–5.1)
Sodium: 156 mmol/L — ABNORMAL HIGH (ref 135–145)

## 2018-06-27 LAB — GLUCOSE, CAPILLARY
GLUCOSE-CAPILLARY: 305 mg/dL — AB (ref 70–99)
GLUCOSE-CAPILLARY: 353 mg/dL — AB (ref 70–99)
GLUCOSE-CAPILLARY: 399 mg/dL — AB (ref 70–99)
GLUCOSE-CAPILLARY: 399 mg/dL — AB (ref 70–99)
Glucose-Capillary: 330 mg/dL — ABNORMAL HIGH (ref 70–99)
Glucose-Capillary: 360 mg/dL — ABNORMAL HIGH (ref 70–99)

## 2018-06-27 LAB — LITHIUM LEVEL: Lithium Lvl: 0.34 mmol/L — ABNORMAL LOW (ref 0.60–1.20)

## 2018-06-27 LAB — RPR: RPR Ser Ql: NONREACTIVE

## 2018-06-27 LAB — PROCALCITONIN: PROCALCITONIN: 0.4 ng/mL

## 2018-06-27 LAB — MAGNESIUM: Magnesium: 2.2 mg/dL (ref 1.7–2.4)

## 2018-06-27 LAB — HIV ANTIBODY (ROUTINE TESTING W REFLEX): HIV SCREEN 4TH GENERATION: NONREACTIVE

## 2018-06-27 MED ORDER — AMILORIDE HCL 5 MG PO TABS: 5.0000 mg | ORAL_TABLET | Freq: Every day | ORAL | Status: DC

## 2018-06-27 MED ORDER — HYDROCHLOROTHIAZIDE 25 MG PO TABS
25.0000 mg | ORAL_TABLET | Freq: Every day | ORAL | Status: DC
  Administered 2018-06-27 – 2018-06-28 (×2): 25 mg via ORAL
  Filled 2018-06-27 (×2): qty 1

## 2018-06-27 MED ORDER — NITROGLYCERIN 2 % TD OINT
1.0000 [in_us] | TOPICAL_OINTMENT | Freq: Four times a day (QID) | TRANSDERMAL | Status: DC
  Administered 2018-06-27 – 2018-06-30 (×11): 1 [in_us] via TOPICAL
  Filled 2018-06-27 (×2): qty 30

## 2018-06-27 MED ORDER — AMILORIDE HCL 5 MG PO TABS
5.0000 mg | ORAL_TABLET | Freq: Every day | ORAL | Status: DC
  Administered 2018-06-27 – 2018-06-29 (×3): 5 mg via ORAL
  Filled 2018-06-27 (×3): qty 1

## 2018-06-27 MED ORDER — DESMOPRESSIN ACE SPRAY REFRIG 0.01 % NA SOLN
1.0000 | Freq: Once | NASAL | Status: AC
  Administered 2018-06-27: 1 via NASAL
  Filled 2018-06-27: qty 5

## 2018-06-27 MED ORDER — METOPROLOL TARTRATE 50 MG PO TABS
100.0000 mg | ORAL_TABLET | Freq: Two times a day (BID) | ORAL | Status: DC
  Administered 2018-06-27 – 2018-07-01 (×7): 100 mg via ORAL
  Filled 2018-06-27: qty 1
  Filled 2018-06-27 (×2): qty 2
  Filled 2018-06-27: qty 1
  Filled 2018-06-27 (×5): qty 2
  Filled 2018-06-27 (×2): qty 1

## 2018-06-27 MED ORDER — INSULIN ASPART 100 UNIT/ML ~~LOC~~ SOLN
0.0000 [IU] | SUBCUTANEOUS | Status: DC
  Administered 2018-06-27 (×3): 20 [IU] via SUBCUTANEOUS
  Administered 2018-06-28: 11 [IU] via SUBCUTANEOUS
  Administered 2018-06-28: 15 [IU] via SUBCUTANEOUS
  Administered 2018-06-28: 7 [IU] via SUBCUTANEOUS
  Administered 2018-06-28: 15 [IU] via SUBCUTANEOUS
  Administered 2018-06-28 – 2018-06-29 (×4): 11 [IU] via SUBCUTANEOUS
  Administered 2018-06-29: 20 [IU] via SUBCUTANEOUS
  Administered 2018-06-29: 11 [IU] via SUBCUTANEOUS

## 2018-06-27 MED ORDER — INSULIN GLARGINE 100 UNIT/ML ~~LOC~~ SOLN
10.0000 [IU] | Freq: Once | SUBCUTANEOUS | Status: AC
  Administered 2018-06-27: 10 [IU] via SUBCUTANEOUS
  Filled 2018-06-27: qty 0.1

## 2018-06-27 MED ORDER — INSULIN GLARGINE 100 UNIT/ML ~~LOC~~ SOLN
20.0000 [IU] | Freq: Two times a day (BID) | SUBCUTANEOUS | Status: DC
  Administered 2018-06-27 – 2018-06-29 (×4): 20 [IU] via SUBCUTANEOUS
  Filled 2018-06-27 (×5): qty 0.2

## 2018-06-27 MED ORDER — DESMOPRESSIN ACETATE 1.5 MG/ML NA SOLN
2.0000 | Freq: Once | NASAL | Status: DC
  Filled 2018-06-27: qty 2.5

## 2018-06-27 MED ORDER — HYDROCHLOROTHIAZIDE 25 MG PO TABS: 25.0000 mg | ORAL_TABLET | Freq: Every day | ORAL | Status: DC

## 2018-06-27 NOTE — Progress Notes (Signed)
RT offered to place pt on CPAP for the night. PT receiving meds at this time. RT will return to place pt on CPAP for the night. RT will continue to monitor.

## 2018-06-27 NOTE — Progress Notes (Signed)
PROGRESS NOTE                                                                                                                                                                                                             Patient Demographics:    Frank Cox, is a 57 y.o. male, DOB - Jul 23, 1961, IHW:[id number]  Admit date - 06/29/2018   Admitting Physician Kipp Brood, MD  Outpatient Primary MD for the patient is Antony Contras, MD  LOS - 4  Chief Complaint  Patient presents with  . Altered Mental Status  . Fall       Brief Narrative this is a 57 year old morbidly obese Caucasian gentleman with known history of DM type II, bipolar disorder on lithium, hypertension, prostate cancer, OSA on CPAP, who was admitted to the hospital on 06/10/2018 for accidental lithium toxicity causing encephalopathy, acute renal failure and hypernatremia.  He was under the care of critical care and was transferred to hospitalist service on 06/25/2018.  This morning when I saw the patient he has a temp of around 103, he appears encephalopathic, unable to answer questions or follow commands, he appears very dehydrated, so far renal is on board and he has been undergoing dialysis treatments for lithium toxicity.  He will be transferred to stepdown immediately and monitored there.   Subjective:   Patient in bed, appears comfortable but somewhat confused, he is an unremarkable historian however he denies any headache, no chest pain or pressure, no shortness of breath , no abdominal pain. No focal weakness.   Assessment  & Plan :     1.  Lithium toxicity with acute metabolic and toxic encephalopathy with severe hypernatremia (DI) -  likely is combination of toxic encephalopathy from lithium along with severe hypernatremia caused by DI, EEG appears stable, CT head unremarkable, no focal deficits, stable EEG stable ABG and ammonia.   Renal on board, continue aggressive IV hydration with D5W, lithium  levels are below normal now, continue to hold lithium, have added HCTZ and amiloride on 06/27/2018, continue monitoring sodium levels, renal on board.  2.  Nonspecific urethral discharge noted around Foley catheter.  Specimen sent for [facility] and chlamydia currently on Rocephin for possible UTI.  3.  Morbid obesity with obstructive sleep apnea.  Nighttime scheduled CPAP.  4.  Dehydration, ARF and severe hypernatremia.  Due to DI caused by lithium toxicity, renal on board aggressive D5W IV, with some element of lateral outlet obstruction.  Foley was placed on 06/24/2018  which will be continued.  Discharge will require follow-up with Dr. Alyson Ingles his urologist.  5.  BiPolar disorder.  For now no further lithium, psych following.  6.  Bladder outflow obstruction.  History of prostate cancer, currently has Foley, Flomax along with Proscar on board.  He will likely go home with Foley follow with Dr. Alyson Ingles post discharge.  He was consulted by previous team.  7. HTN.  Placed on Lopressor, Nitropaste and PRN IV hydralazine. Stable echocardiogram with EF of 60%.  8.  UTI with Fever since the morning of 06/25/2018.  Currently on Rocephin, chest x-ray clear, all cultures negative for 48 hours, question if this is central fever due to lithium-induced injury, cooling blankets and supportive care, he clinically appears nontoxic, will trend procalcitonin, doubt this is primarily infection driven fever.  9. DM type II.  On Lantus and sliding scale.  Increase Lantus dose and changed sliding scale to every 4 hours as sugars are running high due to the D5W drip.  CBG (last 3)  Recent Labs    06/26/18 2359 06/27/18 0435 06/27/18 0844  GLUCAP 305* 330* 399*     Family Communication  :  None avaible  Code Status :  Full  Disposition Plan  :  Stepdown  Consults  :  Renal, Psych, PCCM  Procedures  :    TTE - LVEF 60-65%, moderate LVH, normal wall motion, grade 1 DD, indeterminate LV filling pressure,  aortic valve sclerosis, dilated aortic root to 4.0 cm, trivial MR, moderate LAE, mild RAE, normal LA size, normal IVC  EEG - This is an abnormal electroencephalogram secondary to general background slowing consistent with lithium toxicity.    CT head.  Nonacute.  Renal ultrasound - no obstruction.  Foley in place.  Foley catheter placement 06/24/2018    DVT Prophylaxis  :  Heparin  Lab Results  Component Value Date   PLT 241 06/27/2018    Diet :  Diet Order            DIET SOFT Room service appropriate? Yes; Fluid consistency: Thin  Diet effective now               Inpatient Medications Scheduled Meds: . aMILoride  5 mg Oral Daily  . aspirin  81 mg Oral Daily  . bisacodyl  10 mg Rectal Daily  . chlorhexidine  15 mL Mouth Rinse BID  . finasteride  5 mg Oral Daily  . heparin  5,000 Units Subcutaneous Q8H  . hydrochlorothiazide  25 mg Oral Daily  . insulin aspart  0-20 Units Subcutaneous Q4H  . insulin glargine  10 Units Subcutaneous Once  . insulin glargine  20 Units Subcutaneous BID  . mouth rinse  15 mL Mouth Rinse q12n4p  . metoprolol tartrate  50 mg Oral BID  . nitroGLYCERIN  0.5 inch Topical Q6H  . nystatin   Topical BID  . polyethylene glycol  17 g Oral BID  . sodium chloride flush  10-40 mL Intracatheter Q12H  . tamsulosin  0.8 mg Oral Daily   Continuous Infusions: . cefTRIAXone (ROCEPHIN)  IV 1 g (06/26/18 1223)  . dextrose 200 mL/hr at 06/27/18 0853   PRN Meds:.acetaminophen, haloperidol lactate, hydrALAZINE, metoprolol tartrate  Antibiotics  :   Anti-infectives (From admission, onward)   Start     Dose/Rate Route Frequency Ordered Stop   06/26/18 1015  cefTRIAXone (ROCEPHIN) 1 g in sodium chloride 0.9 % 100 mL IVPB     1 g 200 mL/hr  over 30 Minutes Intravenous Every 24 hours 06/26/18 0857 07/01/18 1014   06/25/18 1300  piperacillin-tazobactam (ZOSYN) IVPB 3.375 g  Status:  Discontinued     3.375 g 12.5 mL/hr over 240 Minutes Intravenous Every 8  hours 06/25/18 1210 06/26/18 0949          Objective:   Vitals:   06/27/18 0251 06/27/18 0728 06/27/18 0949 06/27/18 1002  BP:    (!) 161/96  Pulse:    88  Resp:      Temp: (!) 102.2 F (39 C) (!) 101.1 F (38.4 C) 100 F (37.8 C)   TempSrc: Oral Rectal Rectal   SpO2:        Wt Readings from Last 3 Encounters:  06/20/18 (!) 149.7 kg  07/22/17 (!) 165.6 kg  05/10/17 (!) 161.9 kg     Intake/Output Summary (Last 24 hours) at 06/27/2018 1023 Last data filed at 06/27/2018 0452 Gross per 24 hour  Intake 370 ml  Output 7650 ml  Net -7280 ml     Physical Exam  Awake but not oriented, he is answering a few questions appropriately but then zones out, still appears to be delirious and encephalopathic, moving all 4 extremities but lower extremities minimally [place].AT,PERRAL Supple Neck,No JVD, No cervical lymphadenopathy appriciated.  Symmetrical Chest wall movement, Good air movement bilaterally, CTAB RRR,No Gallops, Rubs or new Murmurs, No Parasternal Heave +ve B.Sounds, Abd Soft, No tenderness, No organomegaly appriciated, No rebound - guarding or rigidity. No Cyanosis, Clubbing or edema,  Lower extremity venous erythema likely chronic    Data Review:    CBC Recent Labs  Lab 06/20/18 1028 06/13/2018 0240 07/03/2018 1158 06/25/18 0612 06/26/18 0502 06/27/18 0446  WBC 12.4* 9.5 9.7 12.3* 11.1* 11.0*  HGB 12.9* 12.3* 11.3* 12.4* 11.2* 12.3*  HCT 41.0 38.5* 36.5* 40.5 39.2 41.8  PLT 279 298 284 275 247 241  MCV 97.9 96.3 97.9 99.3 103.2* 103.0*  MCH 30.8 30.8 30.3 30.4 29.5 30.3  MCHC 31.5 31.9 31.0 30.6 28.6* 29.4*  RDW 14.2 14.1 14.4 15.1 15.9* 15.3  LYMPHSABS 1.3  --   --   --   --   --   MONOABS 0.8  --   --   --   --   --   EOSABS 0.2  --   --   --   --   --   BASOSABS 0.0  --   --   --   --   --     Chemistries  Recent Labs  Lab 06/20/18 1028 06/28/2018 0240 06/27/2018 0835  06/13/2018 1159  06/25/18 1140 06/25/18 1338  06/26/18 0057 06/26/18 0502  06/26/18 0848 06/26/18 1928 06/27/18 0446  NA 144 140 143   < >  --    < > 163* 162*   < > 153* 155* 154* 155* 156*  K 3.8 3.1* 3.1*   < >  --    < > 3.7 4.1  --   --  3.4*  --  3.6 3.6  CL 115* 112* 117*   < >  --    < > >130* >130*  --   --  126*  --  124* 127*  CO2 23 21* 21*   < >  --    < > 23 22  --   --  23  --  21* 22  GLUCOSE 261* 214* 192*   < >  --    < > 266* 276*  --   --  394*  --  345* 380*  BUN 12 8 7    < >  --    < > 8 8  --   --  10  --  10 10  CREATININE 1.67* 1.75* 1.66*   < >  --    < > 1.94* 2.02*  --   --  2.24*  --  2.17* 2.08*  CALCIUM 11.7* 11.0* 10.7*   < >  --    < > 11.4* 11.3*  --   --  10.2  --  10.9* 10.7*  MG  --   --  2.2  --   --   --  2.3  --   --   --  2.2  --   --  2.2  AST 14* 17  --   --  11*  --  25  --   --   --   --   --   --   --   ALT 14 16  --   --  13  --  16  --   --   --   --   --   --   --   ALKPHOS 78 67  --   --  61  --  72  --   --   --   --   --   --   --   BILITOT 1.4* 0.6  --   --  0.8  --  1.1  --   --   --   --   --   --   --    < > = values in this interval not displayed.   ------------------------------------------------------------------------------------------------------------------ No results for input(s): CHOL, HDL, LDLCALC, TRIG, CHOLHDL, LDLDIRECT in the last 72 hours.  Lab Results  Component Value Date   HGBA1C 5.5 07/19/2017   ------------------------------------------------------------------------------------------------------------------ No results for input(s): TSH, T4TOTAL, T3FREE, THYROIDAB in the last 72 hours.  Invalid input(s): FREET3 ------------------------------------------------------------------------------------------------------------------ No results for input(s): VITAMINB12, FOLATE, FERRITIN, TIBC, IRON, RETICCTPCT in the last 72 hours.  Coagulation profile No results for input(s): INR, PROTIME in the last 168 hours.  No results for input(s): DDIMER in the last 72 hours.  Cardiac Enzymes No  results for input(s): CKMB, TROPONINI, MYOGLOBIN in the last 168 hours.  Invalid input(s): CK ------------------------------------------------------------------------------------------------------------------    Component Value Date/Time   BNP 30.7 05/10/2017 1144    Micro Results Recent Results (from the past 240 hour(s))  Culture, Urine     Status: None   Collection Time: 06/29/2018  5:49 AM  Result Value Ref Range Status   Specimen Description URINE, CATHETERIZED  Final   Special Requests NONE  Final   Culture   Final    NO GROWTH Performed at [facility], [address], [place], [place] [address]    Report Status 06/26/2018 FINAL  Final  MRSA PCR Screening     Status: None   Collection Time: 06/20/2018 12:02 PM  Result Value Ref Range Status   MRSA by PCR NEGATIVE NEGATIVE Final    Comment:        The GeneXpert MRSA Assay (FDA approved for NASAL specimens only), is one component of a comprehensive MRSA colonization surveillance program. It is not intended to diagnose MRSA infection nor to guide or monitor treatment for MRSA infections. Performed at [facility], [place] [address], [place], [place] [address]   Culture, blood (routine x 2)     Status: None (Preliminary  result)   Collection Time: 06/25/18 11:39 AM  Result Value Ref Range Status   Specimen Description BLOOD LEFT ARM  Final   Special Requests   Final    BOTTLES DRAWN AEROBIC ONLY Blood Culture adequate volume   Culture   Final    NO GROWTH 2 DAYS Performed at [facility], [address], [place], [place] [address]    Report Status PENDING  Incomplete  Culture, blood (routine x 2)     Status: None (Preliminary result)   Collection Time: 06/25/18 11:45 AM  Result Value Ref Range Status   Specimen Description BLOOD RIGHT HAND  Final   Special Requests   Final    BOTTLES DRAWN AEROBIC ONLY Blood Culture adequate volume   Culture   Final    NO GROWTH 2 DAYS Performed at [facility], [place] [address], [place], [place] [address]    Report Status PENDING  Incomplete    Radiology Reports  Ct Head Wo Contrast  Result Date: 06/20/2018 CLINICAL DATA:  dizziness for " a few weeks" Per EMS : Pt has had 3 unwitnessed falls in the last 2 weeks. Pt lives with father at home. Pt is oriented to person and place. Pt disoriented to year and situation "Pt confused EXAM: CT HEAD WITHOUT CONTRAST TECHNIQUE: Contiguous axial images were obtained from the base of the skull through the vertex without intravenous contrast. COMPARISON:  None. FINDINGS: Brain: No evidence of acute infarction, hemorrhage, hydrocephalus, extra-axial collection or mass lesion/mass effect. Vascular: No hyperdense vessel or unexpected calcification. Skull: Normal. Negative for fracture or focal lesion. Sinuses/Orbits: No acute finding. Other: None IMPRESSION: Negative Electronically Signed   By: Lucrezia Europe M.D.   On: 06/20/2018 11:37   US Renal  Result Date: 06/18/2018 CLINICAL DATA:  Acute kidney injury.  Hypertension and diabetes. EXAM: RENAL / URINARY TRACT ULTRASOUND COMPLETE COMPARISON:  CT 08/10/2016 FINDINGS: Right Kidney: Length: 13.4 cm, consistent with diabetic glomerular nephropathy. No hydronephrosis. Two renal cysts in the 2-3 cm size range. Parenchymal echogenicity is slightly increased. Left Kidney: Length: 16 0.0 cm, consistent with diabetic the Candis Schatz for optic the. No hydronephrosis. One cyst measuring up to 2 cm in diameter. Parenchymal echogenicity is slightly increased. Bladder: Foley catheter in the bladder. IMPRESSION: Enlarged echogenic kidneys consistent with diabetic glomerular nephropathy. No obstruction. Bilateral benign appearing renal cysts. Electronically Signed   By: Nelson Chimes M.D.   On: 06/19/2018 17:54   Dg Chest Port 1 View  Result Date: 06/25/2018 CLINICAL DATA:  Shortness of breath.  Fever this morning. EXAM: PORTABLE CHEST 1 VIEW COMPARISON:  Single-view of the chest  07/05/2018. PA and lateral chest 05/10/2017. FINDINGS: Right subclavian approach central venous catheter has its tip in the right brachiocephalic vein, unchanged. There is cardiomegaly and vascular congestion. No consolidative process, pneumothorax or effusion. IMPRESSION: No acute disease. Cardiomegaly and vascular congestion. Right subclavian central venous catheter tip projects in the right brachiocephalic vein. Electronically Signed   By: Inge Rise M.D.   On: 06/25/2018 12:58   Dg Chest Port 1 View  Result Date: 06/22/2018 CLINICAL DATA:  Central line placement EXAM: PORTABLE CHEST 1 VIEW COMPARISON:  05/10/2017 FINDINGS: Right subclavian central line with tip directed inferiorly, seen at the level of the upper SVC. Low volume chest with vascular congestion. There is asymmetric airspace disease in the right mid lung. Cardiopericardial enlargement. Vascular pedicle widening, accentuated by rotation IMPRESSION: 1. Right-sided central line with tip near the upper  SVC. No evident pneumothorax. 2. Cardiomegaly and vascular congestion. 3. Asymmetric alveolar edema versus pneumonia on the right. Electronically Signed   By: Monte Fantasia M.D.   On: 06/20/2018 12:52    Time Spent in minutes  30   Lala Lund M.D on 06/27/2018 at 10:23 AM  To page go to [email or web address] - password [facility]

## 2018-06-27 NOTE — Progress Notes (Signed)
Inpatient Diabetes Program Recommendations  AACE/ADA: New Consensus Statement on Inpatient Glycemic Control (2015)  Target Ranges:  Prepandial:   less than 140 mg/dL      Peak postprandial:   less than 180 mg/dL (1-2 hours)      Critically ill patients:  140 - 180 mg/dL   Lab Results  Component Value Date   GLUCAP 399 (H) 06/27/2018   HGBA1C 5.5 07/19/2017    Review of Glycemic Control Results for Frank Cox, Frank Cox (MRN [id number]) as of 06/27/2018 10:37  Ref. Range 06/26/2018 20:47 06/26/2018 23:59 06/27/2018 04:35 06/27/2018 08:44  Glucose-Capillary Latest Ref Range: 70 - 99 mg/dL 295 (H) 305 (H) 330 (H) 399 (H)   Diabetes history: Type 2 DM Outpatient Diabetes medications: not taking listed antidiabetic meds: Amaryl 4 mg bid, Trulicity, Actos, Invokamet Current orders for Inpatient glycemic control: Novolog 0-20 units Q4H, Lantus 20 units BID, Lantus 10 units x1 this AM,  Inpatient Diabetes Program Recommendations:    Blood sugar trends continuing to increase. Noted on D5 and probable DI related to lithium. Agree with increases to Lantus and correction Q4H.   Also, last A1C was from 2018, consider adding A1C? Will continue to follow.   Thanks, Bronson Curb, MSN, RNC-OB Diabetes Coordinator [phone] (8a-5p)

## 2018-06-27 NOTE — Progress Notes (Signed)
Subjective:  UOP back up again, sodium staying up  creatinine stable to improved last 24 hours- cont to have fever - still getting D5 W at 200 per hour.  Some agitation reported - restraints and sitter- more out of it  Objective Vital signs in last 24 hours: Vitals:   06/27/18 0251 06/27/18 0728 06/27/18 0949 06/27/18 1002  BP:    (!) 161/96  Pulse:    89  Resp:    19  Temp: (!) 102.2 F (39 C) (!) 101.1 F (38.4 C) 100 F (37.8 C) 100 F (37.8 C)  TempSrc: Oral Rectal Rectal Rectal  SpO2:    100%   Weight change:   Intake/Output Summary (Last 24 hours) at 06/27/2018 1121 Last data filed at 06/27/2018 0452 Gross per 24 hour  Intake 370 ml  Output 6150 ml  Net -5780 ml    Assessment/ Plan: Pt is a 57 y.o. yo male with bipolar on lithium who was admitted on 06/18/2018 with decreased MS- lithium level 2.37 and crt of 1.75  Assessment/Plan: 1. Renal- HD times one on 10/18 for lithium toxicity, level normalized.  Then discovered to have BOO s/p foley and then noted to have polyuria and hypernatremia either due to post obs diuresis or nephrogenic DI from lithium- Frank Cox- agree with HCTZ and amiloride will also give one dose of DDAVP to see if can get urine volume down.  Can remove temp HD cath if not needed for access  2. HTN/vol- BUN and crt stable last 24 hours- I think due to having close to 8 liters out (post obs diuresis vs DI from lithium) -  still getting d5w at 200 per hour, I actually will inc rate to 250 per hour so hopefully will settle out.  Only on metoprolol and prn hydralazine for now- BP is up- no changes 3. Anemia- not an issue 4. ID-  Cultures and rocephin per primary  5. Hypernatremia - either due to BOO and post obstr diuresis vs DI- thought was Improving- unfortunately UOP back up- inc d5w- give hctz and amiloride as well as one dose of DDAVP   Frank Cox    Labs: Basic Metabolic Panel: Recent Labs  Lab 06/24/18 0258  06/26/18 0502 06/26/18 0848  06/26/18 1928 06/27/18 0446  NA 146*  145   < > 155* 154* 155* 156*  K 3.1*  3.1*   < > 3.4*  --  3.6 3.6  CL 116*  115*   < > 126*  --  124* 127*  CO2 26  26   < > 23  --  21* 22  GLUCOSE 177*  178*   < > 394*  --  345* 380*  BUN <5*  <5*   < > 10  --  10 10  CREATININE 1.30*  1.31*   < > 2.24*  --  2.17* 2.08*  CALCIUM 9.8  9.8   < > 10.2  --  10.9* 10.7*  PHOS <1.0*  --  3.5  --   --  2.9   < > = values in this interval not displayed.   Liver Function Tests: Recent Labs  Lab 07/04/2018 0240 06/25/2018 1159  06/25/18 1140 06/26/18 0502 06/27/18 0446  AST 17 11*  --  25  --   --   ALT 16 13  --  16  --   --   ALKPHOS 67 61  --  72  --   --   BILITOT 0.6  0.8  --  1.1  --   --   PROT 6.5 5.9*  --  6.9  --   --   ALBUMIN 3.4* 3.1*   < > 3.5 3.1* 3.2*   < > = values in this interval not displayed.   No results for input(s): LIPASE, AMYLASE in the last 168 hours. No results for input(s): AMMONIA in the last 168 hours. CBC: Recent Labs  Lab 07/05/2018 0240 06/10/2018 1158 06/25/18 0612 06/26/18 0502 06/27/18 0446  WBC 9.5 9.7 12.3* 11.1* 11.0*  HGB 12.3* 11.3* 12.4* 11.2* 12.3*  HCT 38.5* 36.5* 40.5 39.2 41.8  MCV 96.3 97.9 99.3 103.2* 103.0*  PLT 298 284 275 247 241   Cardiac Enzymes: Recent Labs  Lab 06/30/2018 0835  CKTOTAL 81   CBG: Recent Labs  Lab 06/26/18 1717 06/26/18 2047 06/26/18 2359 06/27/18 0435 06/27/18 0844  GLUCAP 361* 295* 305* 330* 399*    Iron Studies: No results for input(s): IRON, TIBC, TRANSFERRIN, FERRITIN in the last 72 hours. Studies/Results: Dg Chest Port 1 View  Result Date: 06/25/2018 CLINICAL DATA:  Shortness of breath.  Fever this morning. EXAM: PORTABLE CHEST 1 VIEW COMPARISON:  Single-view of the chest 06/10/2018. PA and lateral chest 05/10/2017. FINDINGS: Right subclavian approach central venous catheter has its tip in the right brachiocephalic vein, unchanged. There is cardiomegaly and vascular congestion. No consolidative  process, pneumothorax or effusion. IMPRESSION: No acute disease. Cardiomegaly and vascular congestion. Right subclavian central venous catheter tip projects in the right brachiocephalic vein. Electronically Signed   By: Inge Rise M.D.   On: 06/25/2018 12:58   Medications: Infusions: . cefTRIAXone (ROCEPHIN)  IV 1 g (06/27/18 1030)  . dextrose 200 mL/hr at 06/27/18 0853    Scheduled Medications: . aMILoride  5 mg Oral Daily  . aspirin  81 mg Oral Daily  . bisacodyl  10 mg Rectal Daily  . chlorhexidine  15 mL Mouth Rinse BID  . finasteride  5 mg Oral Daily  . heparin  5,000 Units Subcutaneous Q8H  . hydrochlorothiazide  25 mg Oral Daily  . insulin aspart  0-20 Units Subcutaneous Q4H  . insulin glargine  10 Units Subcutaneous Once  . insulin glargine  20 Units Subcutaneous BID  . mouth rinse  15 mL Mouth Rinse q12n4p  . metoprolol tartrate  100 mg Oral BID  . nitroGLYCERIN  1 inch Topical Q6H  . nystatin   Topical BID  . polyethylene glycol  17 g Oral BID  . sodium chloride flush  10-40 mL Intracatheter Q12H  . tamsulosin  0.8 mg Oral Daily    have reviewed scheduled and prn medications.  Physical Exam: General: alert, restraints , sitter- oral mucosa moist Heart: RRR Lungs: clear Abdomen: soft, non tender Extremities: no edema Dialysis Access: has right subclavian temp cath     06/27/2018,11:21 AM  LOS: 4 days

## 2018-06-28 ENCOUNTER — Inpatient Hospital Stay (HOSPITAL_COMMUNITY): Payer: 59

## 2018-06-28 LAB — URINALYSIS, ROUTINE W REFLEX MICROSCOPIC
Bacteria, UA: NONE SEEN
Bilirubin Urine: NEGATIVE
Glucose, UA: 150 mg/dL — AB
Ketones, ur: NEGATIVE mg/dL
Nitrite: NEGATIVE
PROTEIN: 100 mg/dL — AB
SPECIFIC GRAVITY, URINE: 1.008 (ref 1.005–1.030)
pH: 6 (ref 5.0–8.0)

## 2018-06-28 LAB — RENAL FUNCTION PANEL
ALBUMIN: 3.1 g/dL — AB (ref 3.5–5.0)
ANION GAP: 8 (ref 5–15)
BUN: 15 mg/dL (ref 6–20)
CALCIUM: 10.5 mg/dL — AB (ref 8.9–10.3)
CO2: 21 mmol/L — AB (ref 22–32)
CREATININE: 1.81 mg/dL — AB (ref 0.61–1.24)
Chloride: 122 mmol/L — ABNORMAL HIGH (ref 98–111)
GFR calc Af Amer: 46 mL/min — ABNORMAL LOW (ref >60)
GFR calc non Af Amer: 40 mL/min — ABNORMAL LOW (ref >60)
GLUCOSE: 288 mg/dL — AB (ref 70–99)
PHOSPHORUS: 2.8 mg/dL (ref 2.5–4.6)
Potassium: 4 mmol/L (ref 3.5–5.1)
SODIUM: 151 mmol/L — AB (ref 135–145)

## 2018-06-28 LAB — CBC
HEMATOCRIT: 41.5 % (ref 39.0–52.0)
Hemoglobin: 12.5 g/dL — ABNORMAL LOW (ref 13.0–17.0)
MCH: 30.6 pg (ref 26.0–34.0)
MCHC: 30.1 g/dL (ref 30.0–36.0)
MCV: 101.7 fL — ABNORMAL HIGH (ref 80.0–100.0)
Platelets: UNDETERMINED 10*3/uL (ref 150–400)
RBC: 4.08 MIL/uL — AB (ref 4.22–5.81)
RDW: 14.9 % (ref 11.5–15.5)
WBC: 10.1 10*3/uL (ref 4.0–10.5)
nRBC: 0 % (ref 0.0–0.2)

## 2018-06-28 LAB — GC/CHLAMYDIA PROBE AMP (~~LOC~~) NOT AT ARMC
CHLAMYDIA, DNA PROBE: NEGATIVE
NEISSERIA GONORRHEA: NEGATIVE

## 2018-06-28 LAB — GLUCOSE, CAPILLARY
GLUCOSE-CAPILLARY: 256 mg/dL — AB (ref 70–99)
GLUCOSE-CAPILLARY: 295 mg/dL — AB (ref 70–99)
GLUCOSE-CAPILLARY: 340 mg/dL — AB (ref 70–99)
GLUCOSE-CAPILLARY: 337 mg/dL — AB (ref 70–99)
Glucose-Capillary: 241 mg/dL — ABNORMAL HIGH (ref 70–99)
Glucose-Capillary: 299 mg/dL — ABNORMAL HIGH (ref 70–99)

## 2018-06-28 LAB — PROCALCITONIN: Procalcitonin: 0.44 ng/mL

## 2018-06-28 LAB — LITHIUM LEVEL: Lithium Lvl: 0.21 mmol/L — ABNORMAL LOW (ref 0.60–1.20)

## 2018-06-28 LAB — MAGNESIUM: MAGNESIUM: 2.1 mg/dL (ref 1.7–2.4)

## 2018-06-28 NOTE — Progress Notes (Signed)
Subjective:  Still with fever but improved curve- UOP better, sodium improved as well as creatinine  Objective Vital signs in last 24 hours: Vitals:   06/28/18 0200 06/28/18 0204 06/28/18 0311 06/28/18 0604  BP:  131/71  (!) 149/92  Pulse:  75 83 (!) 110  Resp:  (!) 28 (!) 22 (!) 50  Temp: 99.1 F (37.3 C)  99.4 F (37.4 C)   TempSrc: Axillary  Oral   SpO2:  93% 95% 96%   Weight change:   Intake/Output Summary (Last 24 hours) at 06/28/2018 1109 Last data filed at 06/28/2018 5625 Gross per 24 hour  Intake 720 ml  Output 4350 ml  Net -3630 ml    Assessment/ Plan: Pt is a 57 y.o. yo male with bipolar on lithium who was admitted on 06/13/2018 with decreased MS- lithium level 2.37 and crt of 1.75  Assessment/Plan: 1. Renal- HD times one on 10/18 for lithium toxicity, level normalized.  Then discovered to have BOO s/p foley and then noted to have polyuria and hypernatremia either due to post obs diuresis or nephrogenic DI from lithium- UOP slowed down but still relatively high- now giving HCTZ and amiloride -given one dose of DDAVP.  Something has caused UOP to dec- feel is likely due to hctz and amiloride as opposed to DDAVP 2. HTN/vol- still getting d5w at 250 per hour,   Only on metoprolol and prn hydralazine for now- BP is OK- no changes 3. Anemia- not an issue 4. ID-  Cultures and rocephin per primary  5. Hypernatremia - either due to BOO and post obstr diuresis vs DI from lithium-  Improving-  cont d5w-  hctz and amiloride   Louis Meckel    Labs: Basic Metabolic Panel: Recent Labs  Lab 06/26/18 0502  06/26/18 1928 06/27/18 0446 06/28/18 0457  NA 155*   < > 155* 156* 151*  K 3.4*  --  3.6 3.6 4.0  CL 126*  --  124* 127* 122*  CO2 23  --  21* 22 21*  GLUCOSE 394*  --  345* 380* 288*  BUN 10  --  10 10 15   CREATININE 2.24*  --  2.17* 2.08* 1.81*  CALCIUM 10.2  --  10.9* 10.7* 10.5*  PHOS 3.5  --   --  2.9 2.8   < > = values in this interval not displayed.    Liver Function Tests: Recent Labs  Lab 06/27/2018 0240 06/29/2018 1159  06/25/18 1140 06/26/18 0502 06/27/18 0446 06/28/18 0457  AST 17 11*  --  25  --   --   --   ALT 16 13  --  16  --   --   --   ALKPHOS 67 61  --  72  --   --   --   BILITOT 0.6 0.8  --  1.1  --   --   --   PROT 6.5 5.9*  --  6.9  --   --   --   ALBUMIN 3.4* 3.1*   < > 3.5 3.1* 3.2* 3.1*   < > = values in this interval not displayed.   No results for input(s): LIPASE, AMYLASE in the last 168 hours. No results for input(s): AMMONIA in the last 168 hours. CBC: Recent Labs  Lab 07/02/2018 1158 06/25/18 0612 06/26/18 0502 06/27/18 0446 06/28/18 0457  WBC 9.7 12.3* 11.1* 11.0* 10.1  HGB 11.3* 12.4* 11.2* 12.3* 12.5*  HCT 36.5* 40.5 39.2 41.8 41.5  MCV  97.9 99.3 103.2* 103.0* 101.7*  PLT 284 275 247 241 PLATELET CLUMPS NOTED ON SMEAR, UNABLE TO ESTIMATE   Cardiac Enzymes: Recent Labs  Lab 06/07/2018 0835  CKTOTAL 81   CBG: Recent Labs  Lab 06/27/18 1615 06/27/18 2010 06/28/18 0051 06/28/18 0454 06/28/18 0751  GLUCAP 353* 360* 241* 256* 295*    Iron Studies: No results for input(s): IRON, TIBC, TRANSFERRIN, FERRITIN in the last 72 hours. Studies/Results: No results found. Medications: Infusions: . dextrose 250 mL/hr at 06/28/18 6770    Scheduled Medications: . aMILoride  5 mg Oral Daily  . aspirin  81 mg Oral Daily  . chlorhexidine  15 mL Mouth Rinse BID  . finasteride  5 mg Oral Daily  . heparin  5,000 Units Subcutaneous Q8H  . hydrochlorothiazide  25 mg Oral Daily  . insulin aspart  0-20 Units Subcutaneous Q4H  . insulin glargine  20 Units Subcutaneous BID  . mouth rinse  15 mL Mouth Rinse q12n4p  . metoprolol tartrate  100 mg Oral BID  . nitroGLYCERIN  1 inch Topical Q6H  . nystatin   Topical BID  . polyethylene glycol  17 g Oral BID  . sodium chloride flush  10-40 mL Intracatheter Q12H  . tamsulosin  0.8 mg Oral Daily    have reviewed scheduled and prn medications.  Physical  Exam: General: alert, restraints , looks better  Heart: RRR Lungs: clear Abdomen: soft, non tender Extremities: no edema Dialysis Access: had right subclavian temp cath  - now removed    06/28/2018,11:09 AM  LOS: 5 days

## 2018-06-28 NOTE — Progress Notes (Signed)
HD cath discontinued per MD order, bedside RN was not able to place a comment re: culturing the tip of the HD catheter. HD cath contaminated when d/c'd, not usable for culture. Primary nurse notified MD.    Sandie Ano RN IV [facility] team

## 2018-06-28 NOTE — Progress Notes (Signed)
Pt on cooling blanket. Rectal probe got removed by pt due to frequent movement. When RN attempted to reinsert rectal probe, pt became more agitated and combative. Axillary temp checked first reading and 99.1 second. Schorr, NP notified.

## 2018-06-28 NOTE — Progress Notes (Signed)
RT returned to place pt on CPAP. Pt confused and not wanting to put mask on. Pt also requiring intermittent use of restraints and sitter in room. RT will continue monitoring.

## 2018-06-28 NOTE — Progress Notes (Signed)
Patient woke up this morning and became combative and tried to consistently hit the NT sitting with him.  Patient was continuously trying to get up out of bed and attempted multiple times to pull out his foley catheter with his feet. PRN haldol given and Schorr,NP paged. Order for bilateral wrist and feet restraints placed. Patient's RN notified of situation.

## 2018-06-28 NOTE — Progress Notes (Addendum)
PROGRESS NOTE        PATIENT DETAILS Name: Frank Cox Age: 57 y.o. Sex: male Date of Birth: 05/03/1961 Admit Date: 07/02/2018 Admitting Physician Kipp Brood, MD TKW:[id number], [name], MD  Brief Narrative: Patient is a 57 y.o. male with history of bipolar disorder, DM-2, BPH presented to the hospital with confusion, multiple falls-found to have lithium toxicity along with acute kidney injury.  Admitted by PCCM to the ICU, evaluated by nephrology and underwent hemodialysis.  Further hospital course complicated by hypernatremia, fever.  See below for further details.  Subjective: Confused-apparently got agitated last night requiring restraints.  Assessment/Plan: Lithium toxicity: Resolved with hemodialysis.  Hypernatremia: Felt to be secondary to nephrogenic diabetes in the setting of lithium toxicity or postobstructive diuresis .  Sodium levels slowly downtrending with D5W and amiloride/HCTZ.  Still with significant urine output, -3.5 L overnight-but better than the past few days.  Nephrology following and assisting with care.  Acute kidney injury: Multifactorial-Hemodynamically mediated secondary to significant polyuria and bladder outlet obstruction-slowly improving with supportive care.  Nephrology following.  Bladder outlet obstruction: Foley catheter was placed while in the [place] team did discuss with urology-[name]-recommendations are to continue with Foley catheter on discharge and follow-up with urology as outpatient.  Continue Flomax and Proscar.  Fever: No foci evident-blood cultures on 10/20- so far-chest x-ray on 10/20- so far.  Remove HD catheter today-we will culture tip.  Repeat UA, urine culture, blood culture and chest x-ray today.  Since no obvious source of infection apparent-stop Rocephin.   May be prudent to monitor off antimicrobial therapy-as patient is hemodynamically stable-and repeat cultures as no foci of infection is evident.      Acute metabolic encephalopathy: Secondary to numerous electrolyte abnormalities-fever-moving all 4 extremities-hopefully mental status will improve with treatment/improvement of the above-noted issues.  Supportive care in the meantime.  DM-2: CBGs relatively stable-continue Lantus 20 units and SSI.  Follow and adjust accordingly.  Hypertension: Blood pressure appears reasonably well controlled-continue with metoprolol, HCTZ and amiloride.  Follow and adjust accordingly.  Bipolar disorder: Per psychiatry-no lithium-once encephalopathy has resolved-patient needs a repeat inpatient psychiatric evaluation to determine appropriate disposition. Sitter in place  DVT Prophylaxis: Prophylactic Heparin  Code Status: Full code   Family Communication: None at bedside  Disposition Plan: Remain inpatient-will require several more days of hospitalization before consideration of discharge.  Antimicrobial agents: Anti-infectives (From admission, onward)   Start     Dose/Rate Route Frequency Ordered Stop   06/26/18 1015  cefTRIAXone (ROCEPHIN) 1 g in sodium chloride 0.9 % 100 mL IVPB  Status:  Discontinued     1 g 200 mL/hr over 30 Minutes Intravenous Every 24 hours 06/26/18 0857 06/28/18 0958   06/25/18 1300  piperacillin-tazobactam (ZOSYN) IVPB 3.375 g  Status:  Discontinued     3.375 g 12.5 mL/hr over 240 Minutes Intravenous Every 8 hours 06/25/18 1210 06/26/18 0949      Procedures: 10/21>>TTE: - LVEF 60-65%, moderate LVH, normal wall motion, grade 1 DD,   indeterminate LV filling pressure, aortic valve sclerosis,   dilated aortic root to 4.0 cm, trivial MR, moderate LAE, mild   RAE, normal LA size, normal IVC  10/18>>Right Subclavian HD cath inserted (PCCM)  CONSULTS:  pulmonary/intensive care and nephrology  Time spent: 35- minutes-Greater than 50% of this time was spent in counseling, explanation of diagnosis, planning of  further management, and coordination of  care.  MEDICATIONS: Scheduled Meds: . aMILoride  5 mg Oral Daily  . aspirin  81 mg Oral Daily  . chlorhexidine  15 mL Mouth Rinse BID  . finasteride  5 mg Oral Daily  . heparin  5,000 Units Subcutaneous Q8H  . hydrochlorothiazide  25 mg Oral Daily  . insulin aspart  0-20 Units Subcutaneous Q4H  . insulin glargine  20 Units Subcutaneous BID  . mouth rinse  15 mL Mouth Rinse q12n4p  . metoprolol tartrate  100 mg Oral BID  . nitroGLYCERIN  1 inch Topical Q6H  . nystatin   Topical BID  . polyethylene glycol  17 g Oral BID  . sodium chloride flush  10-40 mL Intracatheter Q12H  . tamsulosin  0.8 mg Oral Daily   Continuous Infusions: . dextrose 250 mL/hr at 06/28/18 0926   PRN Meds:.acetaminophen, haloperidol lactate, hydrALAZINE, metoprolol tartrate   PHYSICAL EXAM: Vital signs: Vitals:   06/28/18 0200 06/28/18 0204 06/28/18 0311 06/28/18 0604  BP:  131/71  (!) 149/92  Pulse:  75 83 (!) 110  Resp:  (!) 28 (!) 22 (!) 50  Temp: 99.1 F (37.3 C)  99.4 F (37.4 C)   TempSrc: Axillary  Oral   SpO2:  93% 95% 96%   There were no vitals filed for this visit. There is no height or weight on file to calculate BMI.   General appearance :Awake, confused-speech clear. HEENT: Atraumatic and Normocephalic Neck: supple, no JVD. Resp:Good air entry bilaterally, no added sounds  CVS: S1 S2 regular, no murmurs.  GI: Bowel sounds present, Non tender and not distended with no gaurding, rigidity or rebound.No organomegaly Extremities: B/L Lower Ext shows no edema, both legs are warm to touch Neurology: Moving all 4 extremities-difficult exam as patient is in four-point restraints. Musculoskeletal:No digital cyanosis Skin:No Rash, warm and dry Wounds:N/A  I have personally reviewed following labs and imaging studies  LABORATORY DATA: CBC: Recent Labs  Lab 06/25/2018 1158 06/25/18 0612 06/26/18 0502 06/27/18 0446 06/28/18 0457  WBC 9.7 12.3* 11.1* 11.0* 10.1  HGB 11.3* 12.4* 11.2*  12.3* 12.5*  HCT 36.5* 40.5 39.2 41.8 41.5  MCV 97.9 99.3 103.2* 103.0* 101.7*  PLT 284 275 247 241 PLATELET CLUMPS NOTED ON SMEAR, UNABLE TO ESTIMATE    Basic Metabolic Panel: Recent Labs  Lab 06/19/2018 0835  06/24/18 0258  06/25/18 1140 06/25/18 1338  06/26/18 0502 06/26/18 0848 06/26/18 1928 06/27/18 0446 06/28/18 0457  NA 143   < > 146*  145   < > 163* 162*   < > 155* 154* 155* 156* 151*  K 3.1*   < > 3.1*  3.1*   < > 3.7 4.1  --  3.4*  --  3.6 3.6 4.0  CL 117*   < > 116*  115*   < > >130* >130*  --  126*  --  124* 127* 122*  CO2 21*   < > 26  26   < > 23 22  --  23  --  21* 22 21*  GLUCOSE 192*   < > 177*  178*   < > 266* 276*  --  394*  --  345* 380* 288*  BUN 7   < > <5*  <5*   < > 8 8  --  10  --  10 10 15   CREATININE 1.66*   < > 1.30*  1.31*   < > 1.94* 2.02*  --  2.24*  --  2.17* 2.08* 1.81*  CALCIUM 10.7*   < > 9.8  9.8   < > 11.4* 11.3*  --  10.2  --  10.9* 10.7* 10.5*  MG 2.2  --   --   --  2.3  --   --  2.2  --   --  2.2 2.1  PHOS  --   --  <1.0*  --   --   --   --  3.5  --   --  2.9 2.8   < > = values in this interval not displayed.    GFR: CrCl cannot be calculated (Unknown ideal weight.).  Liver Function Tests: Recent Labs  Lab 06/11/2018 0240 06/26/2018 1159 06/24/18 0258 06/25/18 1140 06/26/18 0502 06/27/18 0446 06/28/18 0457  AST 17 11*  --  25  --   --   --   ALT 16 13  --  16  --   --   --   ALKPHOS 67 61  --  72  --   --   --   BILITOT 0.6 0.8  --  1.1  --   --   --   PROT 6.5 5.9*  --  6.9  --   --   --   ALBUMIN 3.4* 3.1* 3.1* 3.5 3.1* 3.2* 3.1*   No results for input(s): LIPASE, AMYLASE in the last 168 hours. No results for input(s): AMMONIA in the last 168 hours.  Coagulation Profile: No results for input(s): INR, PROTIME in the last 168 hours.  Cardiac Enzymes: Recent Labs  Lab 06/10/2018 0835  CKTOTAL 81    BNP (last 3 results) No results for input(s): PROBNP in the last 8760 hours.  HbA1C: No results for input(s):  HGBA1C in the last 72 hours.  CBG: Recent Labs  Lab 06/27/18 1615 06/27/18 2010 06/28/18 0051 06/28/18 0454 06/28/18 0751  GLUCAP 353* 360* 241* 256* 295*    Lipid Profile: No results for input(s): CHOL, HDL, LDLCALC, TRIG, CHOLHDL, LDLDIRECT in the last 72 hours.  Thyroid Function Tests: No results for input(s): TSH, T4TOTAL, FREET4, T3FREE, THYROIDAB in the last 72 hours.  Anemia Panel: No results for input(s): VITAMINB12, FOLATE, FERRITIN, TIBC, IRON, RETICCTPCT in the last 72 hours.  Urine analysis:    Component Value Date/Time   COLORURINE YELLOW 06/25/2018 1740   APPEARANCEUR CLOUDY (A) 06/25/2018 1740   LABSPEC 1.005 06/25/2018 1740   PHURINE 7.0 06/25/2018 1740   GLUCOSEU >=500 (A) 06/25/2018 1740   GLUCOSEU >=1000 (A) 11/18/2016 1329   HGBUR LARGE (A) 06/25/2018 1740   BILIRUBINUR NEGATIVE 06/25/2018 1740   KETONESUR NEGATIVE 06/25/2018 1740   PROTEINUR 100 (A) 06/25/2018 1740   UROBILINOGEN 0.2 11/18/2016 1329   NITRITE NEGATIVE 06/25/2018 1740   LEUKOCYTESUR LARGE (A) 06/25/2018 1740    Sepsis Labs: Lactic Acid, Venous    Component Value Date/Time   LATICACIDVEN 0.9 06/08/2018 1158    MICROBIOLOGY: Recent Results (from the past 240 hour(s))  Culture, Urine     Status: None   Collection Time: 07/01/2018  5:49 AM  Result Value Ref Range Status   Specimen Description URINE, CATHETERIZED  Final   Special Requests NONE  Final   Culture   Final    NO GROWTH Performed at [facility], [place] [address] [place], [place] [address]    Report Status 06/26/2018 FINAL  Final  MRSA PCR Screening     Status: None   Collection Time: 07/04/2018 12:02 PM  Result Value Ref Range  Status   MRSA by PCR NEGATIVE NEGATIVE Final    Comment:        The GeneXpert MRSA Assay (FDA approved for NASAL specimens only), is one component of a comprehensive MRSA colonization surveillance program. It is not intended to diagnose MRSA infection nor to guide or monitor  treatment for MRSA infections. Performed at [facility], [place] [address] [place], [place] [address]   Culture, blood (routine x 2)     Status: None (Preliminary result)   Collection Time: 06/25/18 11:39 AM  Result Value Ref Range Status   Specimen Description BLOOD LEFT ARM  Final   Special Requests   Final    BOTTLES DRAWN AEROBIC ONLY Blood Culture adequate volume   Culture   Final    NO GROWTH 3 DAYS Performed at [facility], [place] [address], [place], [place] [address]    Report Status PENDING  Incomplete  Culture, blood (routine x 2)     Status: None (Preliminary result)   Collection Time: 06/25/18 11:45 AM  Result Value Ref Range Status   Specimen Description BLOOD RIGHT HAND  Final   Special Requests   Final    BOTTLES DRAWN AEROBIC ONLY Blood Culture adequate volume   Culture   Final    NO GROWTH 3 DAYS Performed at [facility], [place] [address] [place], [place] [address]    Report Status PENDING  Incomplete  Aerobic/Anaerobic Culture (surgical/deep wound)     Status: None (Preliminary result)   Collection Time: 06/27/18  9:30 AM  Result Value Ref Range Status   Specimen Description PENILE DRAINAGE  Final   Special Requests Normal  Final   Gram Stain   Final    RARE WBC PRESENT,BOTH PMN AND MONONUCLEAR ABUNDANT SQUAMOUS EPITHELIAL CELLS PRESENT FEW GRAM POSITIVE COCCI RARE GRAM POSITIVE RODS    Culture   Final    CULTURE REINCUBATED FOR BETTER GROWTH Performed at [facility], [place] [address], [place], [place] [address]    Report Status PENDING  Incomplete    RADIOLOGY STUDIES/RESULTS: Ct Head Wo Contrast  Result Date: 06/20/2018 CLINICAL DATA:  dizziness for " a few weeks" Per EMS : Pt has had 3 unwitnessed falls in the last 2 weeks. Pt lives with father at home. Pt is oriented to person and place. Pt disoriented to year and situation "Pt confused EXAM: CT HEAD WITHOUT CONTRAST TECHNIQUE: Contiguous axial images were obtained from the  base of the skull through the vertex without intravenous contrast. COMPARISON:  None. FINDINGS: Brain: No evidence of acute infarction, hemorrhage, hydrocephalus, extra-axial collection or mass lesion/mass effect. Vascular: No hyperdense vessel or unexpected calcification. Skull: Normal. Negative for fracture or focal lesion. Sinuses/Orbits: No acute finding. Other: None IMPRESSION: Negative Electronically Signed   By: Lucrezia Europe M.D.   On: 06/20/2018 11:37   US Renal  Result Date: 06/11/2018 CLINICAL DATA:  Acute kidney injury.  Hypertension and diabetes. EXAM: RENAL / URINARY TRACT ULTRASOUND COMPLETE COMPARISON:  CT 08/10/2016 FINDINGS: Right Kidney: Length: 13.4 cm, consistent with diabetic glomerular nephropathy. No hydronephrosis. Two renal cysts in the 2-3 cm size range. Parenchymal echogenicity is slightly increased. Left Kidney: Length: 16 0.0 cm, consistent with diabetic the Candis Schatz for optic the. No hydronephrosis. One cyst measuring up to 2 cm in diameter. Parenchymal echogenicity is slightly increased. Bladder: Foley catheter in the bladder. IMPRESSION: Enlarged echogenic kidneys consistent with diabetic glomerular nephropathy. No obstruction. Bilateral benign appearing renal cysts.  Electronically Signed   By: Nelson Chimes M.D.   On: 06/30/2018 17:54   Dg Chest Port 1 View  Result Date: 06/25/2018 CLINICAL DATA:  Shortness of breath.  Fever this morning. EXAM: PORTABLE CHEST 1 VIEW COMPARISON:  Single-view of the chest 06/21/2018. PA and lateral chest 05/10/2017. FINDINGS: Right subclavian approach central venous catheter has its tip in the right brachiocephalic vein, unchanged. There is cardiomegaly and vascular congestion. No consolidative process, pneumothorax or effusion. IMPRESSION: No acute disease. Cardiomegaly and vascular congestion. Right subclavian central venous catheter tip projects in the right brachiocephalic vein. Electronically Signed   By: Inge Rise M.D.   On:  06/25/2018 12:58   Dg Chest Port 1 View  Result Date: 06/30/2018 CLINICAL DATA:  Central line placement EXAM: PORTABLE CHEST 1 VIEW COMPARISON:  05/10/2017 FINDINGS: Right subclavian central line with tip directed inferiorly, seen at the level of the upper SVC. Low volume chest with vascular congestion. There is asymmetric airspace disease in the right mid lung. Cardiopericardial enlargement. Vascular pedicle widening, accentuated by rotation IMPRESSION: 1. Right-sided central line with tip near the upper SVC. No evident pneumothorax. 2. Cardiomegaly and vascular congestion. 3. Asymmetric alveolar edema versus pneumonia on the right. Electronically Signed   By: Monte Fantasia M.D.   On: 06/28/2018 12:52     LOS: 5 days   Oren Binet, MD  [facility]  If 7PM-7AM, please contact night-coverage  Please page via [email or web address]-Password [email or web address]-click on MD name and type text message  06/28/2018, 10:26 AM

## 2018-06-29 ENCOUNTER — Inpatient Hospital Stay (HOSPITAL_COMMUNITY): Payer: 59

## 2018-06-29 LAB — RENAL FUNCTION PANEL
ALBUMIN: 3.1 g/dL — AB (ref 3.5–5.0)
ANION GAP: 8 (ref 5–15)
BUN: 13 mg/dL (ref 6–20)
CO2: 21 mmol/L — ABNORMAL LOW (ref 22–32)
Calcium: 10.5 mg/dL — ABNORMAL HIGH (ref 8.9–10.3)
Chloride: 116 mmol/L — ABNORMAL HIGH (ref 98–111)
Creatinine, Ser: 1.69 mg/dL — ABNORMAL HIGH (ref 0.61–1.24)
GFR calc Af Amer: 50 mL/min — ABNORMAL LOW (ref >60)
GFR calc non Af Amer: 43 mL/min — ABNORMAL LOW (ref >60)
GLUCOSE: 230 mg/dL — AB (ref 70–99)
PHOSPHORUS: 2.4 mg/dL — AB (ref 2.5–4.6)
POTASSIUM: 3 mmol/L — AB (ref 3.5–5.1)
Sodium: 145 mmol/L (ref 135–145)

## 2018-06-29 LAB — GLUCOSE, CAPILLARY
GLUCOSE-CAPILLARY: 202 mg/dL — AB (ref 70–99)
GLUCOSE-CAPILLARY: 263 mg/dL — AB (ref 70–99)
GLUCOSE-CAPILLARY: 396 mg/dL — AB (ref 70–99)
Glucose-Capillary: 265 mg/dL — ABNORMAL HIGH (ref 70–99)
Glucose-Capillary: 238 mg/dL — ABNORMAL HIGH (ref 70–99)
Glucose-Capillary: 258 mg/dL — ABNORMAL HIGH (ref 70–99)
Glucose-Capillary: 292 mg/dL — ABNORMAL HIGH (ref 70–99)
Glucose-Capillary: 224 mg/dL — ABNORMAL HIGH (ref 70–99)
Glucose-Capillary: 215 mg/dL — ABNORMAL HIGH (ref 70–99)

## 2018-06-29 LAB — URINE CULTURE: CULTURE: NO GROWTH

## 2018-06-29 LAB — PROCALCITONIN: Procalcitonin: 0.59 ng/mL

## 2018-06-29 LAB — CBC
HCT: 40.2 % (ref 39.0–52.0)
Hemoglobin: 12.5 g/dL — ABNORMAL LOW (ref 13.0–17.0)
MCH: 30.4 pg (ref 26.0–34.0)
MCHC: 31.1 g/dL (ref 30.0–36.0)
MCV: 97.8 fL (ref 80.0–100.0)
NRBC: 0 % (ref 0.0–0.2)
PLATELETS: 201 10*3/uL (ref 150–400)
RBC: 4.11 MIL/uL — AB (ref 4.22–5.81)
RDW: 14 % (ref 11.5–15.5)
WBC: 11.1 10*3/uL — ABNORMAL HIGH (ref 4.0–10.5)

## 2018-06-29 LAB — LITHIUM LEVEL: LITHIUM LVL: 0.14 mmol/L — AB (ref 0.60–1.20)

## 2018-06-29 MED ORDER — LORAZEPAM 2 MG/ML IJ SOLN: 1.0000 mg | INTRAMUSCULAR | Status: DC | PRN

## 2018-06-29 MED ORDER — DEXMEDETOMIDINE HCL IN NACL 200 MCG/50ML IV SOLN
0.4000 ug/kg/h | INTRAVENOUS | Status: DC
  Administered 2018-06-29: 0.5 ug/kg/h via INTRAVENOUS

## 2018-06-29 MED ORDER — INSULIN ASPART 100 UNIT/ML ~~LOC~~ SOLN
0.0000 [IU] | SUBCUTANEOUS | Status: DC
  Administered 2018-06-29: 7 [IU] via SUBCUTANEOUS
  Administered 2018-06-29: 11 [IU] via SUBCUTANEOUS
  Administered 2018-06-29 – 2018-06-30 (×2): 7 [IU] via SUBCUTANEOUS
  Administered 2018-06-30: 11 [IU] via SUBCUTANEOUS
  Administered 2018-06-30: 15 [IU] via SUBCUTANEOUS

## 2018-06-29 MED ORDER — LORAZEPAM 2 MG/ML IJ SOLN
1.0000 mg | INTRAMUSCULAR | Status: DC | PRN
  Administered 2018-06-29 – 2018-06-30 (×5): 2 mg via INTRAVENOUS
  Administered 2018-06-30 (×2): 1 mg via INTRAVENOUS
  Administered 2018-06-30 – 2018-07-02 (×12): 2 mg via INTRAVENOUS
  Filled 2018-06-29 (×19): qty 1

## 2018-06-29 MED ORDER — AMILORIDE HCL 5 MG PO TABS
10.0000 mg | ORAL_TABLET | Freq: Two times a day (BID) | ORAL | Status: DC
  Administered 2018-06-29 – 2018-07-03 (×6): 10 mg via ORAL
  Filled 2018-06-29 (×8): qty 2

## 2018-06-29 MED ORDER — LORAZEPAM 2 MG/ML IJ SOLN: 2.0000 mg | Freq: Four times a day (QID) | INTRAMUSCULAR | Status: DC | PRN

## 2018-06-29 MED ORDER — POTASSIUM CHLORIDE CRYS ER 20 MEQ PO TBCR
40.0000 meq | EXTENDED_RELEASE_TABLET | Freq: Every day | ORAL | Status: DC
  Administered 2018-06-30 – 2018-07-01 (×2): 40 meq via ORAL
  Filled 2018-06-29 (×2): qty 2

## 2018-06-29 MED ORDER — CLONIDINE HCL 0.1 MG/24HR TD PTWK
0.1000 mg | MEDICATED_PATCH | TRANSDERMAL | Status: DC
  Administered 2018-06-29: 0.1 mg via TRANSDERMAL
  Filled 2018-06-29: qty 1

## 2018-06-29 MED ORDER — LORAZEPAM 2 MG/ML IJ SOLN
INTRAMUSCULAR | Status: AC
  Filled 2018-06-29: qty 1

## 2018-06-29 MED ORDER — LORAZEPAM 2 MG/ML IJ SOLN
2.0000 mg | Freq: Once | INTRAMUSCULAR | Status: AC
  Administered 2018-06-29: 2 mg via INTRAVENOUS
  Filled 2018-06-29: qty 1

## 2018-06-29 MED ORDER — DEXMEDETOMIDINE HCL IN NACL 200 MCG/50ML IV SOLN
0.4000 ug/kg/h | INTRAVENOUS | Status: DC
  Filled 2018-06-29 (×2): qty 50

## 2018-06-29 MED ORDER — DEXMEDETOMIDINE HCL IN NACL 400 MCG/100ML IV SOLN: 0.4000 ug/kg/h | INTRAVENOUS | Status: DC

## 2018-06-29 MED ORDER — DEXMEDETOMIDINE HCL IN NACL 400 MCG/100ML IV SOLN
0.4000 ug/kg/h | INTRAVENOUS | Status: DC
  Administered 2018-06-29: 0.3 ug/kg/h via INTRAVENOUS
  Administered 2018-06-29: 0.5 ug/kg/h via INTRAVENOUS
  Filled 2018-06-29: qty 100

## 2018-06-29 NOTE — Progress Notes (Addendum)
NAME:  Frank Cox, MRN:  [id number], DOB:  [birth year], LOS: 6 ADMISSION DATE:  06/16/2018, CONSULTATION DATE:  06/21/2018 REFERRING MD:  Stark Jock, ED [facility], CHIEF COMPLAINT:  Alerted mental status.  Brief History   The patient is a 57 year old man who requires urgent hemodialysis for acute on chronic lithium toxicity. Has been increasingly confused, experiencing multiple falls over the last week.  Li level 2.37 (upper limit 1.2) and nephrology wishes to dialyze the patient.   He was transferred out of ICU on 10/20.  On 10/24, he had agitation that did not respond to ativan or haldol; therefore, PCCM was called back for transfer to ICU.   Past Medical History  Bipolar affective disorder, T2DM on OHA, HTN, Prostate cancer, OSA on CPAP.  Significant Hospital Events   Admitted 10/18.  Had acute renal failure with creatinine 1.61, Foley catheter was placed in he put out about 2 L immediately after that.  Underwent emergent dialysis for lithium toxicity 10/19: Lithium has normalized.  Spoke to urology regarding concern about obstructive uropathy, they recommended keeping Foley catheter in place given recent renal injury and having him follow-up as outpatient 10/20 transfer to SDU 10/24 transfer back to ICU due to agitation  Consults: date of consult/date signed off & final recs:  Nephrology 10/18 - recommending HD Poison Control - recommending hydration, following serial Li levels. Psych 10/19 > do not restart lithium, needs psych consult once encephalopathy clears, recommending inpatient psych admission once medically cleared  Procedures (surgical and bedside):  None  Significant Diagnostic Tests:  CT head 10/15 > negative. Renal US 10/18 > diabetic glomerular nephropathy.  No obstruction.  Bilateral benign appearing renal cyts.  Micro Data:  Urine 10/18 > neg. Blood 10/20 >  Blood 10/23 >  Urine 10/23 > neg.  Antimicrobials:  Zosyn 10/20 > 10/21. Ceftriaxone 10/21 >  10/23.  Subjective:  Agitated.  In 4 point restraints.  Not responding to questions or following commands.  Objective   Blood pressure 139/90, pulse (!) 128, temperature 99.1 F (37.3 C), temperature source Axillary, resp. rate (!) 66, SpO2 97 %.        Intake/Output Summary (Last 24 hours) at 06/29/2018 1343 Last data filed at 06/29/2018 1100 Gross per 24 hour  Intake 240 ml  Output 7600 ml  Net -7360 ml   There were no vitals filed for this visit.  Examination: General: Middle aged male, agitated. Neuro: Awake, agitated, does not follow commands or respond to questions. HEENT: [place]/AT. Sclerae anicteric, EOMI. Cardiovascular: Tachy, regular, no M/R/G.  Lungs: Respirations even and unlabored.  CTA bilaterally, No W/R/R. Abdomen: BS x 4, soft, NT/ND.  Musculoskeletal: No gross deformities, no edema.  Skin: Intact, warm, no rashes.   Assessment & Plan:   Agitation - unclear etiology. - Start low dose precedex and wean to off. - Continue PRN haldol, low dose ativan.  Acute on chronic Li toxicity with acute toxic encephalopathy - s/p HD on 10/18 with normalization of Li levels. - Nothing further. - No further lithium per psych.  Bipolar affective disorder - patient denies suicidal intent.  S/p psych eval 10/19. - Needs repeat psych eval once encephalopathy clears and is medically stable (and possibly inpatient psych admission)  Probable OSA / OHS. - Nocturnal CPAP if will tolerate.  ? Nephrogenic DI from lithium. Hypertnatremia - felt to be due to above. - Nephrology following. - Continue amiloride per nephrology. - Continue D5W at 150 (rate decreased 10/24 from 250 previously).  Hypokalemia - s/p repletion. - Monitor.  Acute on chronic renal failure Stage 2 CKD. - Nephrology following.  Bladder outlet obstruction - s/p foley by urology. - Keep foley in place until f/u at d/c (per prior conversations with Dr. Alyson Ingles). - Continue flomax and proscar.  Fevers  - resolved.  Culture data negative.  S/p abx 10/23. - Monitor off abx.  DM. - Continue SSI. - D/c lantus for now.   Disposition / Summary of Today's Plan 06/29/18   Transfer back to ICU for low dose precedex.  Hopefully can wean this to off overnight.    Diet: Regular diet. Pain/Anxiety/Delirium protocol (if indicated): N/A. VAP protocol (if indicated): N/A. DVT prophylaxis: UFH. GI prophylaxis: N/A. Hyperglycemia protocol: SSI Mobility: Bedrest. Code Status: full. Family Communication: no family present.  Labs (personally reviewed)  CBC: Recent Labs  Lab 06/25/18 0612 06/26/18 0502 06/27/18 0446 06/28/18 0457 06/29/18 0339  WBC 12.3* 11.1* 11.0* 10.1 11.1*  HGB 12.4* 11.2* 12.3* 12.5* 12.5*  HCT 40.5 39.2 41.8 41.5 40.2  MCV 99.3 103.2* 103.0* 101.7* 97.8  PLT 275 247 241 PLATELET CLUMPS NOTED ON SMEAR, UNABLE TO ESTIMATE 119    Basic Metabolic Panel: Recent Labs  Lab 07/04/2018 0835  06/24/18 0258  06/25/18 1140  06/26/18 0502 06/26/18 0848 06/26/18 1928 06/27/18 0446 06/28/18 0457 06/29/18 0339  NA 143   < > 146*  145   < > 163*   < > 155* 154* 155* 156* 151* 145  K 3.1*   < > 3.1*  3.1*   < > 3.7   < > 3.4*  --  3.6 3.6 4.0 3.0*  CL 117*   < > 116*  115*   < > >130*   < > 126*  --  124* 127* 122* 116*  CO2 21*   < > 26  26   < > 23   < > 23  --  21* 22 21* 21*  GLUCOSE 192*   < > 177*  178*   < > 266*   < > 394*  --  345* 380* 288* 230*  BUN 7   < > <5*  <5*   < > 8   < > 10  --  10 10 15 13   CREATININE 1.66*   < > 1.30*  1.31*   < > 1.94*   < > 2.24*  --  2.17* 2.08* 1.81* 1.69*  CALCIUM 10.7*   < > 9.8  9.8   < > 11.4*   < > 10.2  --  10.9* 10.7* 10.5* 10.5*  MG 2.2  --   --   --  2.3  --  2.2  --   --  2.2 2.1  --   PHOS  --   --  <1.0*  --   --   --  3.5  --   --  2.9 2.8 2.4*   < > = values in this interval not displayed.   GFR: CrCl cannot be calculated (Unknown ideal weight.). Recent Labs  Lab 06/19/2018 0835 06/22/2018 1158   06/26/18 0502 06/27/18 0446 06/27/18 1054 06/28/18 0457 06/29/18 0339  PROCALCITON  --   --   --   --   --  0.40 0.44 0.59  WBC  --  9.7   < > 11.1* 11.0*  --  10.1 11.1*  LATICACIDVEN 1.7 0.9  --   --   --   --   --   --    < > =  values in this interval not displayed.    Liver Function Tests: Recent Labs  Lab 06/06/2018 0240 07/04/2018 1159  06/25/18 1140 06/26/18 0502 06/27/18 0446 06/28/18 0457 06/29/18 0339  AST 17 11*  --  25  --   --   --   --   ALT 16 13  --  16  --   --   --   --   ALKPHOS 67 61  --  72  --   --   --   --   BILITOT 0.6 0.8  --  1.1  --   --   --   --   PROT 6.5 5.9*  --  6.9  --   --   --   --   ALBUMIN 3.4* 3.1*   < > 3.5 3.1* 3.2* 3.1* 3.1*   < > = values in this interval not displayed.   No results for input(s): LIPASE, AMYLASE in the last 168 hours. No results for input(s): AMMONIA in the last 168 hours.  ABG    Component Value Date/Time   PHART 7.399 06/25/2018 1158   PCO2ART 39.9 06/25/2018 1158   PO2ART 62.1 (L) 06/25/2018 1158   HCO3 23.5 06/25/2018 1158   TCO2 18.0 09/09/2009 0935   ACIDBASEDEF 0.3 06/25/2018 1158   O2SAT 91.0 06/25/2018 1158     Coagulation Profile: No results for input(s): INR, PROTIME in the last 168 hours.  Cardiac Enzymes: Recent Labs  Lab 06/19/2018 0835  CKTOTAL 81    HbA1C: Hemoglobin A1C  Date/Time Value Ref Range Status  08/25/2015 9.9  Final   Hgb A1c MFr Bld  Date/Time Value Ref Range Status  07/19/2017 08:30 AM 5.5 4.6 - 6.5 % Final    Comment:    Glycemic Control Guidelines for People with Diabetes:Non Diabetic:  <6%Goal of Therapy: <7%Additional Action Suggested:  >8%   04/21/2017 10:13 AM 5.7 4.6 - 6.5 % Final    Comment:    Glycemic Control Guidelines for People with Diabetes:Non Diabetic:  <6%Goal of Therapy: <7%Additional Action Suggested:  >8%     CBG: Recent Labs  Lab 06/28/18 1749 06/28/18 2044 06/29/18 0009 06/29/18 0408 06/29/18 0539  GLUCAP 299* 337* 265* 202* 263*     CC time: 30 min.   Montey Hora, [facility] Pager: [phone]  or [phone] 06/29/2018, 2:11 PM  Attending Note:  57 year old male with PMH of bipolar disorder disorder admitted for lithium overdose.  Patient was dialyzed and transferred to the floors where he became extremely agitated and developed what appears to be like DTs once benzos were reduced.  On exam, he is arousable and protecting his airway but clearly confused and tremulous and difficult to control with clear lungs.  I reviewed CXR, no acute disease noted.  Discussed with TRH-MD.  Transfer to the ICU and start on a precedex drip.  Patient was transferred and started on precedex drip but benzos dose was doubled.  No need for intubation for now.  Monitor closely in the ICU for airway protection.  Given history may need assistance from psych and once able to take PO will start home meds.  The patient is critically ill with multiple organ systems failure and requires high complexity decision making for assessment and support, frequent evaluation and titration of therapies, application of advanced monitoring technologies and extensive interpretation of multiple databases.   Critical Care Time devoted to patient  care services described in this note is  69  Minutes. This time reflects time of care of this signee Dr Jennet Maduro. This critical care time does not reflect procedure time, or teaching time or supervisory time of PA/NP/Med student/Med Resident etc but could involve care discussion time.  Rush Farmer, M.D. [facility]. Pager: [phone]. After hours pager: [phone].

## 2018-06-29 NOTE — Progress Notes (Signed)
Subjective:  Still with fever but improved curve- UOP back up, sodium improved as well as creatinine.  Having some kind of dystonia this AM ?   Objective Vital signs in last 24 hours: Vitals:   06/29/18 0109 06/29/18 0411 06/29/18 0440 06/29/18 0710  BP:   138/72 91/81  Pulse:   76 97  Resp:   (!) 34 (!) 30  Temp: (!) 100.4 F (38 C) 99.3 F (37.4 C)  100.1 F (37.8 C)  TempSrc: Oral   Axillary  SpO2:   94% 96%   Weight change:   Intake/Output Summary (Last 24 hours) at 06/29/2018 1154 Last data filed at 06/29/2018 1100 Gross per 24 hour  Intake 240 ml  Output 7600 ml  Net -7360 ml    Assessment/ Plan: Pt is a 57 y.o. yo male with bipolar on lithium who was admitted on 06/21/2018 with decreased MS- lithium level 2.37 and crt of 1.75  Assessment/Plan: 1. Renal- HD times one on 10/18 for lithium toxicity, level normalized.  Then discovered to have BOO s/p foley and then noted to have polyuria and hypernatremia either due to post obs diuresis or nephrogenic DI from lithium (when pt lucid he did report longstanding thirst so think is neph DI)- UOP slowed down but now back up-  now giving HCTZ and amiloride will stop HCTZ and take amiloride to 10 BID as dose for nephrogenic DI and follow.    2. HTN/vol- still getting d5w at 250 per hour, sodium now close to normal- will dec rate.   Only on metoprolol and prn hydralazine for now- BP is OK- no changes 3. Anemia- not an issue 4. ID-  Cultures and rocephin per primary  5. Hypernatremia - either due to BOO  and post obstr diuresis (less likely )  vs DI from lithium (more likely)-  Improving-  cont d5w at dec rate and amiloride  6. Hypokalemia- replete   Louis Meckel    Labs: Basic Metabolic Panel: Recent Labs  Lab 06/27/18 0446 06/28/18 0457 06/29/18 0339  NA 156* 151* 145  K 3.6 4.0 3.0*  CL 127* 122* 116*  CO2 22 21* 21*  GLUCOSE 380* 288* 230*  BUN 10 15 13   CREATININE 2.08* 1.81* 1.69*  CALCIUM 10.7* 10.5*  10.5*  PHOS 2.9 2.8 2.4*   Liver Function Tests: Recent Labs  Lab 06/14/2018 0240 06/24/2018 1159  06/25/18 1140  06/27/18 0446 06/28/18 0457 06/29/18 0339  AST 17 11*  --  25  --   --   --   --   ALT 16 13  --  16  --   --   --   --   ALKPHOS 67 61  --  72  --   --   --   --   BILITOT 0.6 0.8  --  1.1  --   --   --   --   PROT 6.5 5.9*  --  6.9  --   --   --   --   ALBUMIN 3.4* 3.1*   < > 3.5   < > 3.2* 3.1* 3.1*   < > = values in this interval not displayed.   No results for input(s): LIPASE, AMYLASE in the last 168 hours. No results for input(s): AMMONIA in the last 168 hours. CBC: Recent Labs  Lab 06/25/18 0612 06/26/18 0502 06/27/18 0446 06/28/18 0457 06/29/18 0339  WBC 12.3* 11.1* 11.0* 10.1 11.1*  HGB 12.4* 11.2* 12.3* 12.5* 12.5*  HCT 40.5 39.2 41.8 41.5 40.2  MCV 99.3 103.2* 103.0* 101.7* 97.8  PLT 275 247 241 PLATELET CLUMPS NOTED ON SMEAR, UNABLE TO ESTIMATE 201   Cardiac Enzymes: Recent Labs  Lab 06/26/2018 0835  CKTOTAL 81   CBG: Recent Labs  Lab 06/28/18 1749 06/28/18 2044 06/29/18 0009 06/29/18 0408 06/29/18 0539  GLUCAP 299* 337* 265* 202* 263*    Iron Studies: No results for input(s): IRON, TIBC, TRANSFERRIN, FERRITIN in the last 72 hours. Studies/Results: Dg Chest Port 1 View  Result Date: 06/28/2018 CLINICAL DATA:  Short of breath. EXAM: PORTABLE CHEST 1 VIEW COMPARISON:  06/25/2018 and older exams. FINDINGS: Cardiac silhouette is mildly enlarged. No mediastinal or hilar masses. Prominent bronchovascular markings, stable. There linear areas of opacity in the right mid lung and both bases consistent with scarring and/or subsegmental atelectasis. No convincing pneumonia. No pulmonary edema. No pleural effusion or pneumothorax. IMPRESSION: 1. No acute cardiopulmonary disease. Electronically Signed   By: Lajean Manes M.D.   On: 06/28/2018 12:47   Medications: Infusions: . dextrose 250 mL/hr at 06/28/18 1443    Scheduled Medications: .  aMILoride  5 mg Oral Daily  . aspirin  81 mg Oral Daily  . chlorhexidine  15 mL Mouth Rinse BID  . finasteride  5 mg Oral Daily  . heparin  5,000 Units Subcutaneous Q8H  . insulin aspart  0-20 Units Subcutaneous Q4H  . insulin glargine  20 Units Subcutaneous BID  . mouth rinse  15 mL Mouth Rinse q12n4p  . metoprolol tartrate  100 mg Oral BID  . nitroGLYCERIN  1 inch Topical Q6H  . nystatin   Topical BID  . polyethylene glycol  17 g Oral BID  . potassium chloride  40 mEq Oral Daily  . sodium chloride flush  10-40 mL Intracatheter Q12H  . tamsulosin  0.8 mg Oral Daily    have reviewed scheduled and prn medications.  Physical Exam: General: alert, restraints , diaphoretic - non verbal this AM- just staring  Heart: RRR Lungs: clear Abdomen: soft, non tender Extremities: no edema Dialysis Access: had right subclavian temp cath  - now removed    06/29/2018,11:54 AM  LOS: 6 days

## 2018-06-29 NOTE — Progress Notes (Signed)
Attempted to place patient on CPAP for the night- Pt confused and swatting at mask- Removed CPAP from patient's face and pt calmed down.  Will cont to offer CPAP.  Machine at bedside for patient use.

## 2018-06-29 NOTE — Significant Event (Signed)
Rapid Response Event Note  Overview:  RN called for increase agitation. MD at bedside.     Initial Focused Assessment: On my arrival pt restrained in bed remains agitated spite receiving Ativan 2 mg IVP and Haldol 5 mg IVP. Portable CXR obtained, pt needing constant redirecting as he was trying to get out of the bed. They were unable to get ABD due to pt unable to lay still, pt would not follow commands. Further evaluation of ongoing confusion and AMS. No interventions from RRT    Interventions: Transported to 4n15   Event Summary:   start time 1200 end time 1340      at          [facility], Sela Hua

## 2018-06-29 NOTE — Progress Notes (Signed)
Technician able to obtain CXR but not abdominal. She will re attempt abd xray once patient settled more with upcoming precedex drip.

## 2018-06-29 NOTE — Progress Notes (Signed)
CPAP at bedside, however, patient in restraints due to agiation and pullling at lines. Patient will likely not leave on if placed on if hands were free. Will hold off on CPAP until patient coherent enough to have restraints off to be able to remove if needed to be.

## 2018-06-29 NOTE — Progress Notes (Signed)
Pt. Has redness, dryness and flakiness to Bilateral lower extremeties.  At this time patient only has wrist restraints.  Discoloration most likely due to poor circulation.

## 2018-06-29 NOTE — Progress Notes (Signed)
Pt very agitated, uncooperative, and is posing a danger to himself and staff members. 4-pt restraints reapplied per MD order. Haldol 5 mg IV given without any effect noted. Spoke with Dr. Sloan Leiter who then ordered 2 mg Ativan IV x1, however this also had no therapeutic effect. Pt very tachycardic and diaphoretic and very difficult to keep electrodes on for tele. E-link utilized and staff suggested transfer to ICU for closer monitoring and possible sedation. Pt remained very agitated and combative during the entire process (which all began around 09:00am). Report called to receiving nurse on 4N15 and pt transferred.

## 2018-06-29 NOTE — Progress Notes (Signed)
PT Cancellation Note  Patient Details Name: Frank Cox MRN: [id number] DOB: October 04, 1960   Cancelled Treatment:    Reason Eval/Treat Not Completed: Other (comment) Pt with increased agitation and nursing staff requesting to hold PT. Will follow up as schedule allows.   Leighton Ruff, PT, DPT  Acute Rehabilitation Services  Pager: [phone] Office: [phone]    [name] 06/29/2018, 11:37 AM

## 2018-06-29 NOTE — Consult Note (Signed)
Patient is now admitted to ICU for Precedex drip for agitation in setting of acute metabolic encephalopathy. Please consult psychiatry when patient is able to participate in interview for reevaluation for further psychiatric needs.   Buford Dresser, DO 06/29/18 2:29 PM

## 2018-06-29 NOTE — Progress Notes (Signed)
PROGRESS NOTE        PATIENT DETAILS Name: Frank Cox Age: 57 y.o. Sex: male Date of Birth: April 03, 1961 Admit Date: 06/25/2018 Admitting Physician Frank Brood, MD [name], Frank Brow, MD  Brief Narrative: Patient is a 57 y.o. male with history of bipolar disorder, DM-2, BPH presented to the hospital with confusion, multiple falls-found to have lithium toxicity along with acute kidney injury.  Admitted by PCCM to the ICU, evaluated by nephrology and underwent hemodialysis.  Further hospital course complicated by hypernatremia, fever.  See below for further details.  Subjective: Much more confused-agitated-attempting to hit staff-in four-point restraints.  Got Haldol x2, and Ativan x1.  Per nursing staff-gets more agitated, tachypneic, flushed, tachycardic after Haldol.  Assessment/Plan: Acute metabolic encephalopathy: Worsening this morning-requiring four-point restraints-still fighting staff-required Haldol, finally given Ativan and has come down somewhat.  Patient is obese has OSA-will need to be very cautious with Ativan.  Have consulted critical care-I suspect safest option is to monitor him in the ICU with Precedex infusion.  Not sure if patient has some sort of anticholinergic effect from [name] staff noticed tachycardia, flushing spells, diaphoresis following injections of Haldol.  PCCM consulted for transfer to ICU.  Lithium toxicity: Resolved with HD.  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  Sodium levels have down trended with initiation of amiloride and D5W.  Stop HCTZ today.  Nephrology following and assisting with care.  Still has significant urine output-more than 6 L overnight.  Acute kidney injury: Multifactorial-Hemodynamically mediated secondary to significant polyuria and bladder outlet  obstruction-renal function continues to improve.  Nephrology following.    Bladder outlet obstruction: Foley catheter was placed while in the ICU-PCCM team did discuss with urology-[name]-recommendations are to continue with Foley catheter on discharge and follow-up with urology as outpatient.  Continue Flomax and Proscar.  Fever: Fever curve definitely better-no foci of infection apparent-all antimicrobial therapy was discontinued on 10/23.  Blood cultures on 10/20, 10/23.  Chest x-ray negative for pneumonia.  Not sure if this is fever related to antibiotic use (drug fever)-plan is to monitor off antimicrobial therapy and follow culture data.  DM-2: CBGs controlled-but diet is erratic-he is very confused-allow some mild hypoglycemia in the setting-continue Lantus 20 units and SSI.  Follow and adjust accordingly.   Hypertension: Blood pressure appears reasonably well controlled-continue with metoprolol, HCTZ and amiloride.  Follow and adjust accordingly.  Bipolar disorder: Per psychiatry-no lithium-once encephalopathy has resolved-patient needs a repeat inpatient psychiatric evaluation to determine appropriate disposition. Sitter in place  DVT Prophylaxis: Prophylactic Heparin  Code Status: Full code   Family Communication: None at bedside  Disposition Plan: Remain inpatient-will require several more days of hospitalization before consideration of discharge.  Antimicrobial agents: Anti-infectives (From admission, onward)   Start     Dose/Rate Route Frequency Ordered Stop   06/26/18 1015  cefTRIAXone (ROCEPHIN) 1 g in sodium chloride 0.9 % 100 mL IVPB  Status:  Discontinued     1 g 200 mL/hr over 30 Minutes Intravenous Every 24 hours 06/26/18 0857 06/28/18 0958   06/25/18 1300  piperacillin-tazobactam (ZOSYN) IVPB 3.375 g  Status:  Discontinued     3.375 g 12.5 mL/hr over 240 Minutes Intravenous Every 8 hours 06/25/18 1210 06/26/18 0949  Procedures: 10/21>>TTE: - LVEF  60-65%, moderate LVH, normal wall motion, grade 1 DD,   indeterminate LV filling pressure, aortic valve sclerosis,   dilated aortic root to 4.0 cm, trivial MR, moderate LAE, mild   RAE, normal LA size, normal IVC  10/18>>Right Subclavian HD cath inserted (PCCM)  CONSULTS:  pulmonary/intensive care and nephrology  Time spent: 35- minutes-Greater than 50% of this time was spent in counseling, explanation of diagnosis, planning of further management, and coordination of care.  MEDICATIONS: Scheduled Meds: . aMILoride  10 mg Oral BID  . aspirin  81 mg Oral Daily  . chlorhexidine  15 mL Mouth Rinse BID  . finasteride  5 mg Oral Daily  . heparin  5,000 Units Subcutaneous Q8H  . insulin aspart  0-20 Units Subcutaneous Q4H  . insulin glargine  20 Units Subcutaneous BID  . LORazepam      . mouth rinse  15 mL Mouth Rinse q12n4p  . metoprolol tartrate  100 mg Oral BID  . nitroGLYCERIN  1 inch Topical Q6H  . nystatin   Topical BID  . polyethylene glycol  17 g Oral BID  . potassium chloride  40 mEq Oral Daily  . sodium chloride flush  10-40 mL Intracatheter Q12H  . tamsulosin  0.8 mg Oral Daily   Continuous Infusions: . dexmedetomidine (PRECEDEX) IV infusion    . dextrose 250 mL/hr at 06/28/18 1443   PRN Meds:.acetaminophen, haloperidol lactate, hydrALAZINE, LORazepam, metoprolol tartrate   PHYSICAL EXAM: Vital signs: Vitals:   06/29/18 0411 06/29/18 0440 06/29/18 0710 06/29/18 1208  BP:  138/72 91/81 139/90  Pulse:  76 97 (!) 128  Resp:  (!) 34 (!) 30 (!) 66  Temp: 99.3 F (37.4 C)  100.1 F (37.8 C) 99.1 F (37.3 C)  TempSrc:   Axillary Axillary  SpO2:  94% 96% 97%   There were no vitals filed for this visit. There is no height or weight on file to calculate BMI.   General appearance: Diaphoretic-flushed-tachycardic, tachypneic-in four-point restraints.  Very confused. Eyes:no scleral icterus. HEENT: Atraumatic and Normocephalic Neck: supple, no JVD. Resp:Good air  entry bilaterally, some scattered rales CVS: S1 S2 regular, no murmurs.  GI: Bowel sounds present, obese but nontender.  Soft.   Extremities: B/L Lower Ext shows no edema, both legs are warm to touch Neurology: Very confused-but seems to move moving all 4 extremities-difficult exam. Musculoskeletal:No digital cyanosis Skin:No Rash, warm and dry Wounds:N/A  I have personally reviewed following labs and imaging studies  LABORATORY DATA: CBC: Recent Labs  Lab 06/25/18 0612 06/26/18 0502 06/27/18 0446 06/28/18 0457 06/29/18 0339  WBC 12.3* 11.1* 11.0* 10.1 11.1*  HGB 12.4* 11.2* 12.3* 12.5* 12.5*  HCT 40.5 39.2 41.8 41.5 40.2  MCV 99.3 103.2* 103.0* 101.7* 97.8  PLT 275 247 241 PLATELET CLUMPS NOTED ON SMEAR, UNABLE TO ESTIMATE 191    Basic Metabolic Panel: Recent Labs  Lab 06/06/2018 0835  06/24/18 0258  06/25/18 1140  06/26/18 0502 06/26/18 0848 06/26/18 1928 06/27/18 0446 06/28/18 0457 06/29/18 0339  NA 143   < > 146*  145   < > 163*   < > 155* 154* 155* 156* 151* 145  K 3.1*   < > 3.1*  3.1*   < > 3.7   < > 3.4*  --  3.6 3.6 4.0 3.0*  CL 117*   < > 116*  115*   < > >130*   < > 126*  --  124* 127* 122* 116*  CO2 21*   < > 26  26   < > 23   < > 23  --  21* 22 21* 21*  GLUCOSE 192*   < > 177*  178*   < > 266*   < > 394*  --  345* 380* 288* 230*  BUN 7   < > <5*  <5*   < > 8   < > 10  --  10 10 15 13   CREATININE 1.66*   < > 1.30*  1.31*   < > 1.94*   < > 2.24*  --  2.17* 2.08* 1.81* 1.69*  CALCIUM 10.7*   < > 9.8  9.8   < > 11.4*   < > 10.2  --  10.9* 10.7* 10.5* 10.5*  MG 2.2  --   --   --  2.3  --  2.2  --   --  2.2 2.1  --   PHOS  --   --  <1.0*  --   --   --  3.5  --   --  2.9 2.8 2.4*   < > = values in this interval not displayed.    GFR: CrCl cannot be calculated (Unknown ideal weight.).  Liver Function Tests: Recent Labs  Lab 06/17/2018 0240 06/20/2018 1159  06/25/18 1140 06/26/18 0502 06/27/18 0446 06/28/18 0457 06/29/18 0339  AST 17 11*  --  25   --   --   --   --   ALT 16 13  --  16  --   --   --   --   ALKPHOS 67 61  --  72  --   --   --   --   BILITOT 0.6 0.8  --  1.1  --   --   --   --   PROT 6.5 5.9*  --  6.9  --   --   --   --   ALBUMIN 3.4* 3.1*   < > 3.5 3.1* 3.2* 3.1* 3.1*   < > = values in this interval not displayed.   No results for input(s): LIPASE, AMYLASE in the last 168 hours. No results for input(s): AMMONIA in the last 168 hours.  Coagulation Profile: No results for input(s): INR, PROTIME in the last 168 hours.  Cardiac Enzymes: Recent Labs  Lab 06/16/2018 0835  CKTOTAL 81    BNP (last 3 results) No results for input(s): PROBNP in the last 8760 hours.  HbA1C: No results for input(s): HGBA1C in the last 72 hours.  CBG: Recent Labs  Lab 06/28/18 1749 06/28/18 2044 06/29/18 0009 06/29/18 0408 06/29/18 0539  GLUCAP 299* 337* 265* 202* 263*    Lipid Profile: No results for input(s): CHOL, HDL, LDLCALC, TRIG, CHOLHDL, LDLDIRECT in the last 72 hours.  Thyroid Function Tests: No results for input(s): TSH, T4TOTAL, FREET4, T3FREE, THYROIDAB in the last 72 hours.  Anemia Panel: No results for input(s): VITAMINB12, FOLATE, FERRITIN, TIBC, IRON, RETICCTPCT in the last 72 hours.  Urine analysis:    Component Value Date/Time   COLORURINE STRAW (A) 06/28/2018 1213   APPEARANCEUR CLEAR 06/28/2018 1213   LABSPEC 1.008 06/28/2018 1213   PHURINE 6.0 06/28/2018 1213   GLUCOSEU 150 (A) 06/28/2018 1213   GLUCOSEU >=1000 (A) 11/18/2016 1329   HGBUR MODERATE (A) 06/28/2018 1213   BILIRUBINUR NEGATIVE 06/28/2018 1213   KETONESUR NEGATIVE 06/28/2018 1213   PROTEINUR 100 (A) 06/28/2018 1213   UROBILINOGEN 0.2 11/18/2016 1329   NITRITE  NEGATIVE 06/28/2018 1213   LEUKOCYTESUR TRACE (A) 06/28/2018 1213    Sepsis Labs: Lactic Acid, Venous    Component Value Date/Time   LATICACIDVEN 0.9 06/27/2018 1158    MICROBIOLOGY: Recent Results (from the past 240 hour(s))  Culture, Urine     Status: None    Collection Time: 06/13/2018  5:49 AM  Result Value Ref Range Status   Specimen Description URINE, CATHETERIZED  Final   Special Requests NONE  Final   Culture   Final    NO GROWTH Performed at [facility], [place] [address], [place], [place] [address]    Report Status 06/26/2018 FINAL  Final  MRSA PCR Screening     Status: None   Collection Time: 06/14/2018 12:02 PM  Result Value Ref Range Status   MRSA by PCR NEGATIVE NEGATIVE Final    Comment:        The GeneXpert MRSA Assay (FDA approved for NASAL specimens only), is one component of a comprehensive MRSA colonization surveillance program. It is not intended to diagnose MRSA infection nor to guide or monitor treatment for MRSA infections. Performed at [facility], [place] [address], [place], [place] [address]   Culture, blood (routine x 2)     Status: None (Preliminary result)   Collection Time: 06/25/18 11:39 AM  Result Value Ref Range Status   Specimen Description BLOOD LEFT ARM  Final   Special Requests   Final    BOTTLES DRAWN AEROBIC ONLY Blood Culture adequate volume   Culture   Final    NO GROWTH 4 DAYS Performed at [facility], [place] [address] [place], [place] [address]    Report Status PENDING  Incomplete  Culture, blood (routine x 2)     Status: None (Preliminary result)   Collection Time: 06/25/18 11:45 AM  Result Value Ref Range Status   Specimen Description BLOOD RIGHT HAND  Final   Special Requests   Final    BOTTLES DRAWN AEROBIC ONLY Blood Culture adequate volume   Culture   Final    NO GROWTH 4 DAYS Performed at [facility], [place] [address], [place], [place] [address]    Report Status PENDING  Incomplete  Aerobic/Anaerobic Culture (surgical/deep wound)     Status: None (Preliminary result)   Collection Time: 06/27/18  9:30 AM  Result Value Ref Range Status   Specimen Description PENILE DRAINAGE  Final   Special Requests Normal  Final   Gram Stain   Final    RARE WBC PRESENT,BOTH  PMN AND MONONUCLEAR ABUNDANT SQUAMOUS EPITHELIAL CELLS PRESENT FEW GRAM POSITIVE COCCI RARE GRAM POSITIVE RODS Performed at [facility], [place] [address], [place], [place] [address]    Culture   Final    CULTURE REINCUBATED FOR BETTER GROWTH NO ANAEROBES ISOLATED; CULTURE IN PROGRESS FOR 5 DAYS    Report Status PENDING  Incomplete  Culture, blood (routine x 2)     Status: None (Preliminary result)   Collection Time: 06/28/18 11:34 AM  Result Value Ref Range Status   Specimen Description BLOOD LEFT HAND  Final   Special Requests   Final    BOTTLES DRAWN AEROBIC ONLY Blood Culture adequate volume   Culture   Final    NO GROWTH < 24 HOURS Performed at [facility], [place] [address], [place], [place] [address]    Report Status PENDING  Incomplete  Culture, Urine     Status: None   Collection  Time: 06/28/18 12:13 PM  Result Value Ref Range Status   Specimen Description URINE, CATHETERIZED  Final   Special Requests NONE  Final   Culture   Final    NO GROWTH Performed at [facility], [address] [place], [place] [address]    Report Status 06/29/2018 FINAL  Final  Culture, blood (routine x 2)     Status: None (Preliminary result)   Collection Time: 06/28/18 12:30 PM  Result Value Ref Range Status   Specimen Description BLOOD RIGHT HAND  Final   Special Requests   Final    BOTTLES DRAWN AEROBIC ONLY Blood Culture adequate volume   Culture   Final    NO GROWTH < 24 HOURS Performed at [facility], [place] [address] [place], [place] [address]    Report Status PENDING  Incomplete    RADIOLOGY STUDIES/RESULTS: Ct Head Wo Contrast  Result Date: 06/20/2018 CLINICAL DATA:  dizziness for " a few weeks" Per EMS : Pt has had 3 unwitnessed falls in the last 2 weeks. Pt lives with father at home. Pt is oriented to person and place. Pt disoriented to year and situation "Pt confused EXAM: CT HEAD WITHOUT CONTRAST TECHNIQUE: Contiguous axial images were obtained from  the base of the skull through the vertex without intravenous contrast. COMPARISON:  None. FINDINGS: Brain: No evidence of acute infarction, hemorrhage, hydrocephalus, extra-axial collection or mass lesion/mass effect. Vascular: No hyperdense vessel or unexpected calcification. Skull: Normal. Negative for fracture or focal lesion. Sinuses/Orbits: No acute finding. Other: None IMPRESSION: Negative Electronically Signed   By: Lucrezia Europe M.D.   On: 06/20/2018 11:37   US Renal  Result Date: 06/15/2018 CLINICAL DATA:  Acute kidney injury.  Hypertension and diabetes. EXAM: RENAL / URINARY TRACT ULTRASOUND COMPLETE COMPARISON:  CT 08/10/2016 FINDINGS: Right Kidney: Length: 13.4 cm, consistent with diabetic glomerular nephropathy. No hydronephrosis. Two renal cysts in the 2-3 cm size range. Parenchymal echogenicity is slightly increased. Left Kidney: Length: 16 0.0 cm, consistent with diabetic the Candis Schatz for optic the. No hydronephrosis. One cyst measuring up to 2 cm in diameter. Parenchymal echogenicity is slightly increased. Bladder: Foley catheter in the bladder. IMPRESSION: Enlarged echogenic kidneys consistent with diabetic glomerular nephropathy. No obstruction. Bilateral benign appearing renal cysts. Electronically Signed   By: Nelson Chimes M.D.   On: 06/12/2018 17:54   Dg Chest Port 1 View  Result Date: 06/28/2018 CLINICAL DATA:  Short of breath. EXAM: PORTABLE CHEST 1 VIEW COMPARISON:  06/25/2018 and older exams. FINDINGS: Cardiac silhouette is mildly enlarged. No mediastinal or hilar masses. Prominent bronchovascular markings, stable. There linear areas of opacity in the right mid lung and both bases consistent with scarring and/or subsegmental atelectasis. No convincing pneumonia. No pulmonary edema. No pleural effusion or pneumothorax. IMPRESSION: 1. No acute cardiopulmonary disease. Electronically Signed   By: Lajean Manes M.D.   On: 06/28/2018 12:47   Dg Chest Port 1 View  Result Date:  06/25/2018 CLINICAL DATA:  Shortness of breath.  Fever this morning. EXAM: PORTABLE CHEST 1 VIEW COMPARISON:  Single-view of the chest 06/22/2018. PA and lateral chest 05/10/2017. FINDINGS: Right subclavian approach central venous catheter has its tip in the right brachiocephalic vein, unchanged. There is cardiomegaly and vascular congestion. No consolidative process, pneumothorax or effusion. IMPRESSION: No acute disease. Cardiomegaly and vascular congestion. Right subclavian central venous catheter tip projects in the right brachiocephalic vein. Electronically Signed   By: Inge Rise M.D.   On: 06/25/2018 12:58  Dg Chest Port 1 View  Result Date: 06/24/2018 CLINICAL DATA:  Central line placement EXAM: PORTABLE CHEST 1 VIEW COMPARISON:  05/10/2017 FINDINGS: Right subclavian central line with tip directed inferiorly, seen at the level of the upper SVC. Low volume chest with vascular congestion. There is asymmetric airspace disease in the right mid lung. Cardiopericardial enlargement. Vascular pedicle widening, accentuated by rotation IMPRESSION: 1. Right-sided central line with tip near the upper SVC. No evident pneumothorax. 2. Cardiomegaly and vascular congestion. 3. Asymmetric alveolar edema versus pneumonia on the right. Electronically Signed   By: Monte Fantasia M.D.   On: 06/29/2018 12:52     LOS: 6 days   Oren Binet, MD  [facility]  If 7PM-7AM, please contact night-coverage  Please page via [email or web address] [email or web address]-click on MD name and type text message  06/29/2018, 12:54 PM

## 2018-06-29 NOTE — Progress Notes (Signed)
Inpatient Diabetes Program Recommendations  AACE/ADA: New Consensus Statement on Inpatient Glycemic Control (2015)  Target Ranges:  Prepandial:   less than 140 mg/dL      Peak postprandial:   less than 180 mg/dL (1-2 hours)      Critically ill patients:  140 - 180 mg/dL   Results for BERL, BONFANTI (MRN [id number]) as of 06/29/2018 10:52  Ref. Range 06/28/2018 07:51 06/28/2018 12:04 06/28/2018 17:49 06/28/2018 20:44 06/29/2018 00:09 06/29/2018 04:08 06/29/2018 05:39  Glucose-Capillary Latest Ref Range: 70 - 99 mg/dL 295 (H) 340 (H) 299 (H) 337 (H) 265 (H) 202 (H) 263 (H)   Diabetes history: Type 2 DM Outpatient Diabetes medications: not taking listed antidiabetic meds: Amaryl 4 mg bid, Trulicity, Actos, Invokamet Current orders for Inpatient glycemic control: Novolog 0-20 units Q4H, Lantus 20 units BID, Lantus 10 units x1 this AM,  Inpatient Diabetes Program Recommendations:    Noted on D5 and probable DI related to lithium. Glucose trends above inpatient goal of 140-180 in the 200's. Could consider increasing Lantus to 25 units BID (patient receiving at least Novolog 11 units with each administration time.  Also, last A1C was from 2018, consider adding A1C?  Thanks, Tama Headings RN, MSN, BC-ADM Inpatient Diabetes Coordinator Team Pager [phone] (8a-5p)

## 2018-06-30 DIAGNOSIS — I10 Essential (primary) hypertension: Secondary | ICD-10-CM

## 2018-06-30 LAB — GLUCOSE, CAPILLARY
GLUCOSE-CAPILLARY: 288 mg/dL — AB (ref 70–99)
GLUCOSE-CAPILLARY: 218 mg/dL — AB (ref 70–99)
GLUCOSE-CAPILLARY: 285 mg/dL — AB (ref 70–99)
Glucose-Capillary: 228 mg/dL — ABNORMAL HIGH (ref 70–99)
Glucose-Capillary: 261 mg/dL — ABNORMAL HIGH (ref 70–99)
Glucose-Capillary: 305 mg/dL — ABNORMAL HIGH (ref 70–99)

## 2018-06-30 LAB — RENAL FUNCTION PANEL
ALBUMIN: 3.2 g/dL — AB (ref 3.5–5.0)
Anion gap: 11 (ref 5–15)
BUN: 13 mg/dL (ref 6–20)
CALCIUM: 10.7 mg/dL — AB (ref 8.9–10.3)
CO2: 21 mmol/L — ABNORMAL LOW (ref 22–32)
Chloride: 111 mmol/L (ref 98–111)
Creatinine, Ser: 1.67 mg/dL — ABNORMAL HIGH (ref 0.61–1.24)
GFR calc Af Amer: 51 mL/min — ABNORMAL LOW (ref >60)
GFR, EST NON AFRICAN AMERICAN: 44 mL/min — AB (ref >60)
Glucose, Bld: 278 mg/dL — ABNORMAL HIGH (ref 70–99)
PHOSPHORUS: 2.8 mg/dL (ref 2.5–4.6)
POTASSIUM: 3 mmol/L — AB (ref 3.5–5.1)
Sodium: 143 mmol/L (ref 135–145)

## 2018-06-30 LAB — CBC
HCT: 41 % (ref 39.0–52.0)
HEMOGLOBIN: 12.4 g/dL — AB (ref 13.0–17.0)
MCH: 29.7 pg (ref 26.0–34.0)
MCHC: 30.2 g/dL (ref 30.0–36.0)
MCV: 98.3 fL (ref 80.0–100.0)
Platelets: 204 10*3/uL (ref 150–400)
RBC: 4.17 MIL/uL — ABNORMAL LOW (ref 4.22–5.81)
RDW: 13.7 % (ref 11.5–15.5)
WBC: 12.3 10*3/uL — AB (ref 4.0–10.5)
nRBC: 0 % (ref 0.0–0.2)

## 2018-06-30 LAB — CULTURE, BLOOD (ROUTINE X 2)
CULTURE: NO GROWTH
CULTURE: NO GROWTH
SPECIAL REQUESTS: ADEQUATE
Special Requests: ADEQUATE

## 2018-06-30 MED ORDER — INSULIN ASPART 100 UNIT/ML ~~LOC~~ SOLN
0.0000 [IU] | Freq: Every day | SUBCUTANEOUS | Status: DC
  Administered 2018-06-30 – 2018-07-01 (×2): 2 [IU] via SUBCUTANEOUS

## 2018-06-30 MED ORDER — INSULIN ASPART 100 UNIT/ML ~~LOC~~ SOLN
0.0000 [IU] | Freq: Three times a day (TID) | SUBCUTANEOUS | Status: DC
  Administered 2018-06-30: 11 [IU] via SUBCUTANEOUS

## 2018-06-30 MED ORDER — QUETIAPINE FUMARATE 200 MG PO TABS
200.0000 mg | ORAL_TABLET | Freq: Every day | ORAL | Status: DC
  Administered 2018-06-30 – 2018-07-02 (×3): 200 mg via ORAL
  Filled 2018-06-30 (×3): qty 1

## 2018-06-30 MED ORDER — DEXMEDETOMIDINE HCL IN NACL 400 MCG/100ML IV SOLN
0.4000 ug/kg/h | INTRAVENOUS | Status: DC
  Administered 2018-07-01: 1.2 ug/kg/h via INTRAVENOUS
  Filled 2018-06-30: qty 100

## 2018-06-30 MED ORDER — LAMOTRIGINE 100 MG PO TABS
400.0000 mg | ORAL_TABLET | Freq: Every day | ORAL | Status: DC
  Administered 2018-06-30 – 2018-07-02 (×3): 400 mg via ORAL
  Filled 2018-06-30 (×3): qty 4

## 2018-06-30 MED ORDER — DEXMEDETOMIDINE HCL IN NACL 200 MCG/50ML IV SOLN
0.4000 ug/kg/h | INTRAVENOUS | Status: DC
  Administered 2018-06-30: 0.4 ug/kg/h via INTRAVENOUS
  Filled 2018-06-30: qty 50

## 2018-06-30 MED ORDER — INSULIN GLARGINE 100 UNIT/ML ~~LOC~~ SOLN
15.0000 [IU] | Freq: Two times a day (BID) | SUBCUTANEOUS | Status: DC
  Administered 2018-06-30 – 2018-07-01 (×3): 15 [IU] via SUBCUTANEOUS
  Filled 2018-06-30 (×3): qty 0.15

## 2018-06-30 NOTE — Progress Notes (Signed)
[place] TEAM 1 - Stepdown/ICU TEAM  Frank Cox  YKD:[id number] DOB: 1960-10-13 DOA: 06/30/2018 PCP: Antony Contras, MD    Brief Narrative:  57 y.o. male with a history of bipolar disorder, DM2, and BPH who presented with confusion and multiple falls and was found to have lithium toxicity along with acute kidney injury.  Admitted by PCCM to the ICU, evaluated by Nephrology, and underwent hemodialysis.   Significant Events: 10/18 admit - Foley catheter was placed & he put out about 2 L immediately - emergent dialysis for lithium toxicity 10/19 Lithium normalized - Urology recommended keeping Foley catheter in place and having him follow-up as outpatient 10/20 transfer to SDU 10/24 transfer back to ICU due to agitation  Subjective: Awake but slow to answer questions. Not agitated. Can't tell me exactly where he is but knows it is 2019 and we are in [place]. Denies cp, n/v, sob, or abdom pain.   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 - 3400cc urine output last 24hrs, down from 6400 the 24hrs before - cont amiloride for nephrogenic DI - slow free water and watch trend   Recent Labs  Lab 06/26/18 1928 06/27/18 0446 06/28/18 0457 06/29/18 0339 06/30/18 0553  NA 155* 156* 151* 145 143     Acute kidney injury secondary to bladder outlet obstruction - foley to STAY IN PLACE  Recent Labs  Lab 06/26/18 1928 06/27/18 0446 06/28/18 0457 06/29/18 0339 06/30/18 0553  CREATININE 2.17* 2.08* 1.81* 1.69* 1.67*    Bladder outlet obstruction Foley catheter was placed while in the ICU - Urology recommends to continue Foley catheter on discharge and follow-up with Urology as  outpatient - continue Flomax and Proscar.  DM2 CBG running high on high volume D5W (for hypernatremia) - dose w/ lantus - slow IVF - monitor trend   Hypertension Blood pressure controlled  Bipolar disorder Per psychiatry no lithium - once encephalopathy has resolved patient needs a repeat inpatient psychiatric evaluation to determine appropriate disposition   DVT prophylaxis: SQ heparin  Code Status: FULL CODE Family Communication: no family present at time of exam  Disposition Plan: SDU  Consultants:  PCCM Psychiatry   Antimicrobials:  None presently    Objective: Blood pressure (!) 133/93, pulse 80, temperature 98.4 F (36.9 C), temperature source Oral, resp. rate (!) 41, weight (!) 139.6 kg, SpO2 98 %.  Intake/Output Summary (Last 24 hours) at 06/30/2018 1408 Last data filed at 06/30/2018 1200 Gross per 24 hour  Intake 3143.77 ml  Output 3300 ml  Net -156.23 ml   Filed Weights   06/29/18 1350  Weight: (!) 139.6 kg    Examination: General: No acute respiratory distress Lungs: Clear to auscultation bilaterally without wheezes or crackles Cardiovascular: Regular rate and rhythm without murmur gallop or rub normal S1 and S2 Abdomen: Nontender, nondistended, soft, bowel sounds positive, no rebound, no ascites, no appreciable mass Extremities: No significant cyanosis, clubbing, or edema bilateral lower extremities  CBC: Recent Labs  Lab 06/28/18 0457 06/29/18 0339 06/30/18 0553  WBC 10.1 11.1* 12.3*  HGB 12.5* 12.5* 12.4*  HCT 41.5 40.2 41.0  MCV 101.7* 97.8 98.3  PLT PLATELET CLUMPS NOTED ON SMEAR, UNABLE TO ESTIMATE 201 204  Basic Metabolic Panel: Recent Labs  Lab 06/26/18 0502  06/27/18 0446 06/28/18 0457 06/29/18 0339 06/30/18 0553  NA 155*   < > 156* 151* 145 143  K 3.4*   < > 3.6 4.0 3.0* 3.0*  CL 126*   < > 127* 122* 116* 111  CO2 23   < > 22 21* 21* 21*  GLUCOSE 394*   < > 380* 288* 230* 278*  BUN 10   < > 10 15 13 13   CREATININE  2.24*   < > 2.08* 1.81* 1.69* 1.67*  CALCIUM 10.2   < > 10.7* 10.5* 10.5* 10.7*  MG 2.2  --  2.2 2.1  --   --   PHOS 3.5  --  2.9 2.8 2.4* 2.8   < > = values in this interval not displayed.   GFR: CrCl cannot be calculated (Unknown ideal weight.).  Liver Function Tests: Recent Labs  Lab 06/25/18 1140  06/27/18 0446 06/28/18 0457 06/29/18 0339 06/30/18 0553  AST 25  --   --   --   --   --   ALT 16  --   --   --   --   --   ALKPHOS 72  --   --   --   --   --   BILITOT 1.1  --   --   --   --   --   PROT 6.9  --   --   --   --   --   ALBUMIN 3.5   < > 3.2* 3.1* 3.1* 3.2*   < > = values in this interval not displayed.    HbA1C: Hemoglobin A1C  Date/Time Value Ref Range Status  08/25/2015 9.9  Final   Hgb A1c MFr Bld  Date/Time Value Ref Range Status  07/19/2017 08:30 AM 5.5 4.6 - 6.5 % Final    Comment:    Glycemic Control Guidelines for People with Diabetes:Non Diabetic:  <6%Goal of Therapy: <7%Additional Action Suggested:  >8%   04/21/2017 10:13 AM 5.7 4.6 - 6.5 % Final    Comment:    Glycemic Control Guidelines for People with Diabetes:Non Diabetic:  <6%Goal of Therapy: <7%Additional Action Suggested:  >8%     CBG: Recent Labs  Lab 06/29/18 1917 06/29/18 2308 06/30/18 0340 06/30/18 0736 06/30/18 1103  GLUCAP 224* 215* 228* 261* 305*    Recent Results (from the past 240 hour(s))  Culture, Urine     Status: None   Collection Time: 06/17/2018  5:49 AM  Result Value Ref Range Status   Specimen Description URINE, CATHETERIZED  Final   Special Requests NONE  Final   Culture   Final    NO GROWTH Performed at [facility], [place] [address], [place], [place] [address]    Report Status 06/26/2018 FINAL  Final  MRSA PCR Screening     Status: None   Collection Time: 06/22/2018 12:02 PM  Result Value Ref Range Status   MRSA by PCR NEGATIVE NEGATIVE Final    Comment:        The GeneXpert MRSA Assay (FDA approved for NASAL specimens only), is one component of  a comprehensive MRSA colonization surveillance program. It is not intended to diagnose MRSA infection nor to guide or monitor treatment for MRSA infections. Performed at [facility], [place] [address] [place], [place] [address]   Culture, blood (routine x 2)     Status: None   Collection Time: 06/25/18 11:39  AM  Result Value Ref Range Status   Specimen Description BLOOD LEFT ARM  Final   Special Requests   Final    BOTTLES DRAWN AEROBIC ONLY Blood Culture adequate volume   Culture   Final    NO GROWTH 5 DAYS Performed at [facility], [address] [place], [place] [address]    Report Status 06/30/2018 FINAL  Final  Culture, blood (routine x 2)     Status: None   Collection Time: 06/25/18 11:45 AM  Result Value Ref Range Status   Specimen Description BLOOD RIGHT HAND  Final   Special Requests   Final    BOTTLES DRAWN AEROBIC ONLY Blood Culture adequate volume   Culture   Final    NO GROWTH 5 DAYS Performed at [facility], [place] [address], [place], [place] [address]    Report Status 06/30/2018 FINAL  Final  Aerobic/Anaerobic Culture (surgical/deep wound)     Status: None   Collection Time: 06/27/18  9:30 AM  Result Value Ref Range Status   Specimen Description PENILE DRAINAGE  Final   Special Requests Normal  Final   Gram Stain   Final    RARE WBC PRESENT,BOTH PMN AND MONONUCLEAR ABUNDANT SQUAMOUS EPITHELIAL CELLS PRESENT FEW GRAM POSITIVE COCCI RARE GRAM POSITIVE RODS Performed at [facility], [address], [place], [place] [address]    Culture   Final    ABUNDANT STAPHYLOCOCCUS LUGDUNENSIS MODERATE ENTEROCOCCUS FAECALIS NO ANAEROBES ISOLATED; CULTURE IN PROGRESS FOR 5 DAYS    Report Status 06/30/2018 FINAL  Final   Organism ID, Bacteria STAPHYLOCOCCUS LUGDUNENSIS  Final   Organism ID, Bacteria ENTEROCOCCUS FAECALIS  Final      Susceptibility   Enterococcus faecalis - MIC*    AMPICILLIN <=2 SENSITIVE Sensitive     VANCOMYCIN 2  SENSITIVE Sensitive     GENTAMICIN SYNERGY SENSITIVE Sensitive     * MODERATE ENTEROCOCCUS FAECALIS   Staphylococcus lugdunensis - MIC*    CIPROFLOXACIN <=0.5 SENSITIVE Sensitive     ERYTHROMYCIN <=0.25 SENSITIVE Sensitive     GENTAMICIN <=0.5 SENSITIVE Sensitive     OXACILLIN 1 SENSITIVE Sensitive     TETRACYCLINE <=1 SENSITIVE Sensitive     VANCOMYCIN <=0.5 SENSITIVE Sensitive     TRIMETH/SULFA <=10 SENSITIVE Sensitive     CLINDAMYCIN <=0.25 SENSITIVE Sensitive     RIFAMPIN <=0.5 SENSITIVE Sensitive     Inducible Clindamycin NEGATIVE Sensitive     * ABUNDANT STAPHYLOCOCCUS LUGDUNENSIS  Culture, blood (routine x 2)     Status: None (Preliminary result)   Collection Time: 06/28/18 11:34 AM  Result Value Ref Range Status   Specimen Description BLOOD LEFT HAND  Final   Special Requests   Final    BOTTLES DRAWN AEROBIC ONLY Blood Culture adequate volume   Culture   Final    NO GROWTH 2 DAYS Performed at [facility], [address] [place], [place] [address]    Report Status PENDING  Incomplete  Culture, Urine     Status: None   Collection Time: 06/28/18 12:13 PM  Result Value Ref Range Status   Specimen Description URINE, CATHETERIZED  Final   Special Requests NONE  Final   Culture   Final    NO GROWTH Performed at [facility], [address] [place],  [address]    Report Status 06/29/2018 FINAL  Final  Culture, blood (routine x 2)     Status: None (Preliminary result)  Collection Time: 06/28/18 12:30 PM  Result Value Ref Range Status   Specimen Description BLOOD RIGHT HAND  Final   Special Requests   Final    BOTTLES DRAWN AEROBIC ONLY Blood Culture adequate volume   Culture   Final    NO GROWTH 2 DAYS Performed at [facility], [address] [place], [place] [address]    Report Status PENDING  Incomplete     Scheduled Meds: . aMILoride  10 mg Oral BID  . aspirin  81 mg Oral Daily  . chlorhexidine  15 mL Mouth Rinse BID  . cloNIDine   0.1 mg Transdermal Weekly  . finasteride  5 mg Oral Daily  . heparin  5,000 Units Subcutaneous Q8H  . insulin aspart  0-20 Units Subcutaneous Q4H  . insulin glargine  15 Units Subcutaneous BID  . mouth rinse  15 mL Mouth Rinse q12n4p  . metoprolol tartrate  100 mg Oral BID  . nitroGLYCERIN  1 inch Topical Q6H  . nystatin   Topical BID  . polyethylene glycol  17 g Oral BID  . potassium chloride  40 mEq Oral Daily  . sodium chloride flush  10-40 mL Intracatheter Q12H  . tamsulosin  0.8 mg Oral Daily     LOS: 7 days   Cherene Altes, MD [facility] Office  [phone] Pager - Text Page per Amion  If 7PM-7AM, please contact night-coverage per Amion 06/30/2018, 2:08 PM

## 2018-06-30 NOTE — Progress Notes (Signed)
Inpatient Diabetes Program Recommendations  AACE/ADA: New Consensus Statement on Inpatient Glycemic Control (2015)  Target Ranges:  Prepandial:   less than 140 mg/dL      Peak postprandial:   less than 180 mg/dL (1-2 hours)      Critically ill patients:  140 - 180 mg/dL   Lab Results  Component Value Date   GLUCAP 261 (H) 06/30/2018   HGBA1C 5.5 07/19/2017    Review of Glycemic Control Results for Frank Cox, Frank Cox (MRN [id number]) as of 06/30/2018 10:13  Ref. Range 06/29/2018 17:08 06/29/2018 19:17 06/29/2018 23:08 06/30/2018 03:40 06/30/2018 07:36  Glucose-Capillary Latest Ref Range: 70 - 99 mg/dL 258 (H) 224 (H) 215 (H) 228 (H) 261 (H)   Diabetes history: Type 2 DM  Outpatient Diabetes medications: Amaryl 8 mg daily, Actos 15 mg daily, Trulicity 1.5 mg weekly, Invokamet XR 50-1000 mg daily Current orders for Inpatient glycemic control:  Novolog resistant q 4 hours Inpatient Diabetes Program Recommendations:   Please consider restarting Lantus 15 units bid.   Thanks,  Adah Perl, RN, BC-ADM Inpatient Diabetes Coordinator Pager [phone] (8a-5p)

## 2018-06-30 NOTE — Progress Notes (Signed)
Subjective:  Events noted - transferred to ICU for agitation needing iv sedation- UOP 3400- sodium down again   Objective Vital signs in last 24 hours: Vitals:   06/30/18 0600 06/30/18 0700 06/30/18 0800 06/30/18 0834  BP: (!) 164/81 117/80 (!) 123/111   Pulse: 97 74 81   Resp: (!) 22 (!) 39 (!) 33   Temp:    98.7 F (37.1 C)  TempSrc:    Oral  SpO2: 100% 99% 98%   Weight:       Weight change:   Intake/Output Summary (Last 24 hours) at 06/30/2018 0908 Last data filed at 06/30/2018 0815 Gross per 24 hour  Intake 1981.08 ml  Output 4100 ml  Net -2118.92 ml    Assessment/ Plan: Pt is a 57 y.o. yo male with bipolar on lithium who was admitted on 07/03/2018 with decreased MS- lithium level 2.37 and crt of 1.75  Assessment/Plan: 1. Renal- HD times one on 10/18 for lithium toxicity, level normalized.  Then discovered to have BOO s/p foley - then  polyuria and hypernatremia either due to post obs diuresis or nephrogenic DI from lithium (when pt lucid he did report longstanding thirst so think is neph DI)- UOP slowed down on amiloride for nephrogenic DI and follow.    2. HTN/vol- still getting d5w at 250 per hour, sodium now close to normal- have decreased rate.   Only on metoprolol and prn hydralazine for now- BP is OK- no changes 3. Anemia- not an issue 4. ID-  Cultures and rocephin per primary  5. Hypernatremia - either due to BOO  and post obstr diuresis (less likely )  vs DI from lithium (more likely)-  Improving-  cont d5w  and amiloride  6. Hypokalemia- repleting 7. Psych- behaviors requiring transfer to ICU and IV sedation   Renal will sign off- titrate the d5w to the sodium right now as he cannot sense and control his thirst, also keep repleting K as appropriate.  Call with questions   Louis Meckel    Labs: Basic Metabolic Panel: Recent Labs  Lab 06/28/18 0457 06/29/18 0339 06/30/18 0553  NA 151* 145 143  K 4.0 3.0* 3.0*  CL 122* 116* 111  CO2 21* 21* 21*   GLUCOSE 288* 230* 278*  BUN 15 13 13   CREATININE 1.81* 1.69* 1.67*  CALCIUM 10.5* 10.5* 10.7*  PHOS 2.8 2.4* 2.8   Liver Function Tests: Recent Labs  Lab 06/12/2018 1159  06/25/18 1140  06/28/18 0457 06/29/18 0339 06/30/18 0553  AST 11*  --  25  --   --   --   --   ALT 13  --  16  --   --   --   --   ALKPHOS 61  --  72  --   --   --   --   BILITOT 0.8  --  1.1  --   --   --   --   PROT 5.9*  --  6.9  --   --   --   --   ALBUMIN 3.1*   < > 3.5   < > 3.1* 3.1* 3.2*   < > = values in this interval not displayed.   No results for input(s): LIPASE, AMYLASE in the last 168 hours. No results for input(s): AMMONIA in the last 168 hours. CBC: Recent Labs  Lab 06/26/18 0502 06/27/18 0446 06/28/18 0457 06/29/18 0339 06/30/18 0553  WBC 11.1* 11.0* 10.1 11.1* 12.3*  HGB 11.2*  12.3* 12.5* 12.5* 12.4*  HCT 39.2 41.8 41.5 40.2 41.0  MCV 103.2* 103.0* 101.7* 97.8 98.3  PLT 247 241 PLATELET CLUMPS NOTED ON SMEAR, UNABLE TO ESTIMATE 201 204   Cardiac Enzymes: No results for input(s): CKTOTAL, CKMB, CKMBINDEX, TROPONINI in the last 168 hours. CBG: Recent Labs  Lab 06/29/18 1708 06/29/18 1917 06/29/18 2308 06/30/18 0340 06/30/18 0736  GLUCAP 258* 224* 215* 228* 261*    Iron Studies: No results for input(s): IRON, TIBC, TRANSFERRIN, FERRITIN in the last 72 hours. Studies/Results: Dg Abd 1 View  Result Date: 06/29/2018 CLINICAL DATA:  57 y/o  M; acute respiratory distress. EXAM: ABDOMEN - 1 VIEW COMPARISON:  06/10/2018 abdominal ultrasound. 08/10/2016 CT abdomen and pelvis. FINDINGS: Mild air-filled distention of large bowel. Multilevel degenerative changes of the spine. Upper abdomen is excluded from the field of view. IMPRESSION: Mild air-filled distention of the large bowel possibly representing dysmotility or constipation. Electronically Signed   By: Kristine Garbe M.D.   On: 06/29/2018 18:11   Dg Chest Port 1 View  Result Date: 06/29/2018 CLINICAL DATA:   Respiratory failure EXAM: PORTABLE CHEST 1 VIEW COMPARISON:  06/28/2018 FINDINGS: Cardiac shadow is enlarged but stable. Increased vascular markings are again noted and stable. Areas of scarring are again seen bilaterally stable from previous exams. No new focal abnormality is noted. IMPRESSION: Stable appearance of the chest.  No acute abnormality noted. Electronically Signed   By: Inez Catalina M.D.   On: 06/29/2018 13:01   Dg Chest Port 1 View  Result Date: 06/28/2018 CLINICAL DATA:  Short of breath. EXAM: PORTABLE CHEST 1 VIEW COMPARISON:  06/25/2018 and older exams. FINDINGS: Cardiac silhouette is mildly enlarged. No mediastinal or hilar masses. Prominent bronchovascular markings, stable. There linear areas of opacity in the right mid lung and both bases consistent with scarring and/or subsegmental atelectasis. No convincing pneumonia. No pulmonary edema. No pleural effusion or pneumothorax. IMPRESSION: 1. No acute cardiopulmonary disease. Electronically Signed   By: Lajean Manes M.D.   On: 06/28/2018 12:47   Medications: Infusions: . dexmedetomidine (PRECEDEX) IV infusion Stopped (06/29/18 1700)  . dextrose 150 mL/hr at 06/30/18 0854    Scheduled Medications: . aMILoride  10 mg Oral BID  . aspirin  81 mg Oral Daily  . chlorhexidine  15 mL Mouth Rinse BID  . cloNIDine  0.1 mg Transdermal Weekly  . finasteride  5 mg Oral Daily  . heparin  5,000 Units Subcutaneous Q8H  . insulin aspart  0-20 Units Subcutaneous Q4H  . mouth rinse  15 mL Mouth Rinse q12n4p  . metoprolol tartrate  100 mg Oral BID  . nitroGLYCERIN  1 inch Topical Q6H  . nystatin   Topical BID  . polyethylene glycol  17 g Oral BID  . potassium chloride  40 mEq Oral Daily  . sodium chloride flush  10-40 mL Intracatheter Q12H  . tamsulosin  0.8 mg Oral Daily    have reviewed scheduled and prn medications.  Physical Exam: General: resting, restraints  Heart: RRR Lungs: clear Abdomen: soft, non tender Extremities: no  edema Dialysis Access: had right subclavian temp cath  - now removed    06/30/2018,9:08 AM  LOS: 7 days

## 2018-06-30 NOTE — Progress Notes (Signed)
eLink Physician-Brief Progress Note Patient Name: Frank Cox DOB: [birth year] MRN: [id number]   Date of Service  06/30/2018  HPI/Events of Note  Severe agitation Given 4mg  ativan per RN Home psych meds have been held  eICU Interventions  Resume precedex to gain control Resume home doses lamictal & seroquel     Intervention Category Major Interventions: Delirium, psychosis, severe agitation - evaluation and management  Bary Limbach V. Krystall Kruckenberg 06/30/2018, 9:48 PM

## 2018-06-30 NOTE — Progress Notes (Signed)
PT Cancellation Note  Patient Details Name: Frank Cox MRN: [id number] DOB: [birth year]   Cancelled Treatment:    Reason Eval/Treat Not Completed: Patient declined, no reason specified. " I'm not doing anything, I'm chilling here all weekend."   "Nope" to will you help push up in bed?  Will see pt as soon as he agrees to participate and as able. 06/30/2018  Frank Cox, Frank Cox [phone]  (pager) [phone]  (office)   Frank Cox 06/30/2018, 10:18 AM

## 2018-07-01 ENCOUNTER — Inpatient Hospital Stay (HOSPITAL_COMMUNITY): Payer: 59

## 2018-07-01 DIAGNOSIS — J9600 Acute respiratory failure, unspecified whether with hypoxia or hypercapnia: Secondary | ICD-10-CM

## 2018-07-01 DIAGNOSIS — J9601 Acute respiratory failure with hypoxia: Secondary | ICD-10-CM

## 2018-07-01 LAB — CBC
HCT: 39.8 % (ref 39.0–52.0)
Hemoglobin: 11.9 g/dL — ABNORMAL LOW (ref 13.0–17.0)
MCH: 29.5 pg (ref 26.0–34.0)
MCHC: 29.9 g/dL — ABNORMAL LOW (ref 30.0–36.0)
MCV: 98.8 fL (ref 80.0–100.0)
PLATELETS: 179 10*3/uL (ref 150–400)
RBC: 4.03 MIL/uL — ABNORMAL LOW (ref 4.22–5.81)
RDW: 13.8 % (ref 11.5–15.5)
WBC: 11.5 10*3/uL — ABNORMAL HIGH (ref 4.0–10.5)
nRBC: 0 % (ref 0.0–0.2)

## 2018-07-01 LAB — COMPREHENSIVE METABOLIC PANEL
ALBUMIN: 3 g/dL — AB (ref 3.5–5.0)
ALK PHOS: 62 U/L (ref 38–126)
ALT: 40 U/L (ref 0–44)
AST: 28 U/L (ref 15–41)
Anion gap: 9 (ref 5–15)
BILIRUBIN TOTAL: 0.7 mg/dL (ref 0.3–1.2)
BUN: 16 mg/dL (ref 6–20)
CALCIUM: 10.8 mg/dL — AB (ref 8.9–10.3)
CO2: 19 mmol/L — ABNORMAL LOW (ref 22–32)
Chloride: 117 mmol/L — ABNORMAL HIGH (ref 98–111)
Creatinine, Ser: 2.17 mg/dL — ABNORMAL HIGH (ref 0.61–1.24)
GFR calc non Af Amer: 32 mL/min — ABNORMAL LOW (ref >60)
GFR, EST AFRICAN AMERICAN: 37 mL/min — AB (ref >60)
GLUCOSE: 241 mg/dL — AB (ref 70–99)
Potassium: 3.3 mmol/L — ABNORMAL LOW (ref 3.5–5.1)
Sodium: 145 mmol/L (ref 135–145)
TOTAL PROTEIN: 6.2 g/dL — AB (ref 6.5–8.1)

## 2018-07-01 LAB — GLUCOSE, CAPILLARY
GLUCOSE-CAPILLARY: 159 mg/dL — AB (ref 70–99)
GLUCOSE-CAPILLARY: 180 mg/dL — AB (ref 70–99)
Glucose-Capillary: 336 mg/dL — ABNORMAL HIGH (ref 70–99)
Glucose-Capillary: 211 mg/dL — ABNORMAL HIGH (ref 70–99)
Glucose-Capillary: 218 mg/dL — ABNORMAL HIGH (ref 70–99)
Glucose-Capillary: 210 mg/dL — ABNORMAL HIGH (ref 70–99)

## 2018-07-01 LAB — BLOOD GAS, ARTERIAL
ACID-BASE DEFICIT: 7.2 mmol/L — AB (ref 0.0–2.0)
BICARBONATE: 17.3 mmol/L — AB (ref 20.0–28.0)
Drawn by: 213381
O2 Content: 6 L/min
O2 SAT: 91.6 %
PATIENT TEMPERATURE: 99.3
PCO2 ART: 32.9 mmHg (ref 32.0–48.0)
PO2 ART: 62.5 mmHg — AB (ref 83.0–108.0)
pH, Arterial: 7.343 — ABNORMAL LOW (ref 7.350–7.450)

## 2018-07-01 LAB — MAGNESIUM: MAGNESIUM: 2.5 mg/dL — AB (ref 1.7–2.4)

## 2018-07-01 LAB — PROCALCITONIN: Procalcitonin: 0.58 ng/mL

## 2018-07-01 LAB — LACTIC ACID, PLASMA: Lactic Acid, Venous: 1.1 mmol/L (ref 0.5–1.9)

## 2018-07-01 MED ORDER — INSULIN GLARGINE 100 UNIT/ML ~~LOC~~ SOLN
12.0000 [IU] | Freq: Two times a day (BID) | SUBCUTANEOUS | Status: DC
  Administered 2018-07-01: 12 [IU] via SUBCUTANEOUS
  Filled 2018-07-01 (×2): qty 0.12

## 2018-07-01 MED ORDER — CLONAZEPAM 1 MG PO TABS
1.0000 mg | ORAL_TABLET | Freq: Three times a day (TID) | ORAL | Status: DC
  Administered 2018-07-01 – 2018-07-02 (×5): 1 mg via ORAL
  Filled 2018-07-01 (×5): qty 1

## 2018-07-01 MED ORDER — FUROSEMIDE 10 MG/ML IJ SOLN
60.0000 mg | Freq: Once | INTRAMUSCULAR | Status: AC
  Administered 2018-07-01: 60 mg via INTRAVENOUS
  Filled 2018-07-01: qty 6

## 2018-07-01 MED ORDER — CITALOPRAM HYDROBROMIDE 10 MG PO TABS
40.0000 mg | ORAL_TABLET | Freq: Every day | ORAL | Status: DC
  Administered 2018-07-01 – 2018-07-03 (×2): 40 mg via ORAL
  Filled 2018-07-01 (×3): qty 4

## 2018-07-01 MED ORDER — SODIUM CHLORIDE 0.45 % IV SOLN
INTRAVENOUS | Status: DC
  Administered 2018-07-01: 15:00:00 via INTRAVENOUS

## 2018-07-01 MED ORDER — SODIUM CHLORIDE 0.9 % IV SOLN
3.0000 g | Freq: Four times a day (QID) | INTRAVENOUS | Status: DC
  Administered 2018-07-01 – 2018-07-02 (×4): 3 g via INTRAVENOUS
  Filled 2018-07-01 (×6): qty 3

## 2018-07-01 MED ORDER — INSULIN ASPART 100 UNIT/ML ~~LOC~~ SOLN
0.0000 [IU] | Freq: Three times a day (TID) | SUBCUTANEOUS | Status: DC
  Administered 2018-07-01: 7 [IU] via SUBCUTANEOUS
  Administered 2018-07-02 (×2): 4 [IU] via SUBCUTANEOUS
  Administered 2018-07-02 – 2018-07-03 (×2): 7 [IU] via SUBCUTANEOUS
  Administered 2018-07-03: 15 [IU] via SUBCUTANEOUS
  Administered 2018-07-03: 11 [IU] via SUBCUTANEOUS

## 2018-07-01 MED ORDER — INSULIN ASPART 100 UNIT/ML ~~LOC~~ SOLN
0.0000 [IU] | SUBCUTANEOUS | Status: DC
  Administered 2018-07-01: 15 [IU] via SUBCUTANEOUS
  Administered 2018-07-01 (×2): 4 [IU] via SUBCUTANEOUS

## 2018-07-01 MED ORDER — DEXMEDETOMIDINE HCL IN NACL 200 MCG/50ML IV SOLN
0.0000 ug/kg/h | INTRAVENOUS | Status: DC
  Administered 2018-07-01: 0.6 ug/kg/h via INTRAVENOUS
  Administered 2018-07-01: 0.4 ug/kg/h via INTRAVENOUS
  Administered 2018-07-01: 0.6 ug/kg/h via INTRAVENOUS
  Administered 2018-07-02: 0.4 ug/kg/h via INTRAVENOUS
  Administered 2018-07-02: 0.6 ug/kg/h via INTRAVENOUS
  Administered 2018-07-02: 0.3 ug/kg/h via INTRAVENOUS
  Filled 2018-07-01: qty 150
  Filled 2018-07-01 (×3): qty 50

## 2018-07-01 MED ORDER — SODIUM CHLORIDE 0.45 % IV BOLUS
250.0000 mL | Freq: Once | INTRAVENOUS | Status: AC
  Administered 2018-07-01: 250 mL via INTRAVENOUS

## 2018-07-01 MED ORDER — POTASSIUM CHLORIDE CRYS ER 20 MEQ PO TBCR
40.0000 meq | EXTENDED_RELEASE_TABLET | Freq: Two times a day (BID) | ORAL | Status: DC
  Administered 2018-07-01: 40 meq via ORAL
  Filled 2018-07-01 (×3): qty 2

## 2018-07-01 MED ORDER — SODIUM CHLORIDE 0.9 % IV BOLUS
500.0000 mL | Freq: Once | INTRAVENOUS | Status: AC
  Administered 2018-07-01: 500 mL via INTRAVENOUS

## 2018-07-01 NOTE — Progress Notes (Signed)
Pharmacy Antibiotic Note  Frank Cox is a 57 y.o. male admitted on 06/19/2018 with penile drainage.  Pharmacy has been consulted for Unasyn dosing. Penile drainage growing enterococcus faecalis and staphylococcus lugdunesis. WBC 11.5. Tm 100.33F. nCrCl ~ 35-40 mL/min   Plan: -Unasyn 3 gm IV Q 6 hours -Monitor renal fx and clinical progress  Weight: (!) 307 lb 12.2 oz (139.6 kg)  Temp (24hrs), Avg:99.8 F (37.7 C), Min:98.3 F (36.8 C), Max:100.8 F (38.2 C)  Recent Labs  Lab 06/27/18 0446 06/28/18 0457 06/29/18 0339 06/30/18 0553 07/01/18 0627  WBC 11.0* 10.1 11.1* 12.3* 11.5*  CREATININE 2.08* 1.81* 1.69* 1.67* 2.17*    CrCl cannot be calculated (Unknown ideal weight.).    No Known Allergies  Antimicrobials this admission: Unasyn 10/26 >>   Dose adjustments this admission:   Thank you for allowing pharmacy to be a part of this patient's care.  Albertina Parr, PharmD., BCPS Clinical Pharmacist Clinical phone for 07/01/18 until 3:30pm: [phone] If after 3:30pm, please refer to [facility] for unit-specific pharmacist

## 2018-07-01 NOTE — Progress Notes (Signed)
NAME:  Frank Cox, MRN:  [id number], DOB:  1961/04/25, LOS: 8 ADMISSION DATE:  06/22/2018, CONSULTATION DATE:  06/13/2018 REFERRING MD:  Stark Jock, ED [facility], CHIEF COMPLAINT:  Alerted mental status.  Brief History   57 year old man who requires urgent hemodialysis for acute on chronic lithium toxicity. Has been increasingly confused, experiencing multiple falls over the last week.  Li level 2.37 (upper limit 1.2) and nephrology wishes to dialyze the patient.   He was transferred out of ICU on 10/20.  On 10/24, he had agitation that did not respond to ativan or haldol; therefore, PCCM was called back for transfer to ICU.   Past Medical History  Bipolar affective disorder, T2DM on OHA, HTN, Prostate cancer, OSA on CPAP.  Significant Hospital Events   10/18 Admit with acute renal failure (cr 1.61) Foley catheter > 2 L immediately.  Underwent emergent dialysis for lithium toxicity 10/19 Lithium normalized.   10/20 Tx to SDU 10/24 Tx back to ICU due to agitation 10/26 PCCM called back for AMS, intermittent agitation  Consults: date of consult/date signed off & final recs:  Nephrology 10/18 - recommending HD Poison Control - recommending hydration, following serial Li levels. Psych 10/19 - do not restart lithium, needs psych consult once encephalopathy clears, recommending inpatient psych admission once medically cleared Urology 10/19 - input regarding possible obstructive uropathy, they recommend keeping foley in and follow up as outpatient  Procedures (surgical and bedside):     Significant Diagnostic Tests:  CT head 10/15 > negative. Renal US 10/18 > diabetic glomerular nephropathy.  No obstruction.  Bilateral benign appearing renal cyts.  Micro Data:  Urine 10/18 > negative Blood 10/20 > negative Blood 10/23 >  Urine 10/23 > negative Penile culture 10/22 >> staph lugdunensis (S-, entercoccus (vanco sens) BCx2 10/23 >>   Antimicrobials:  Zosyn 10/20 > 10/21 Ceftriaxone 10/21 >  10/23  Subjective:  RN reports pt altered, on 6L O2.  Intermittent agitation.  States patient told her earlier "I am going to die today".  Intermittently will say he is short of breath but not consistent.   Objective   Blood pressure (!) 149/90, pulse (!) 108, temperature 98.3 F (36.8 C), temperature source Axillary, resp. rate (!) 28, weight (!) 139.6 kg, SpO2 91 %.        Intake/Output Summary (Last 24 hours) at 07/01/2018 1551 Last data filed at 07/01/2018 1500 Gross per 24 hour  Intake 3243.84 ml  Output 2625 ml  Net 618.84 ml   Filed Weights   06/29/18 1350  Weight: (!) 139.6 kg    Examination: General: obese male lying in bed in NAD, appears chronically ill HEENT: MM pink/moist, short thick neck  Neuro: Awakens to voice, mumbles but not oriented, intermittently will move all ext's  CV: s1s2 rrr, no m/r/g PULM: even/non-labored, lungs bilaterally diminished bases, clear anterior  LA:GTXM, non-tender, bsx4 active  Extremities: warm/dry, trace to 1+ BLE pitting edema  Skin: no rashes or lesions  Assessment & Plan:   Agitated Delirium   -intermittent, initially thought related to ETOH withdrawal, AKI, lithium toxicity -bad reaction to haldol P: Begin precedex gtt  PRN ativan  Frequent orientation   Penile Drainage  -culture positive for staph, enterococcus -tmax 100 P: Unasyn per [facility]   Acute on Chronic Lithium Toxicity with Acute Toxic Encephalopathy  - s/p HD on 10/18 with normalization of Li levels. P: No further lithium per PSY   Bipolar Disorder  - patient denies suicidal intent.   -  S/p psych eval 10/19 P: Will need repeat PSY evaluation once mental status clears  Continue celexa, klonopin, seroquel, lamictal   Probable OSA / OHS P: Assess ABG now  Nocturnal CPAP if tolerates   ? Nephrogenic DI from lithium  -resolved  P:  Monitor   Hypertnatremia  - felt to be due to lithium P: Trend BMP  Hypokalemia   P: Monitor, replace as  indicated   Acute on Chronic Renal Failure Stage 2 CKD. P: Trend BMP / urinary output Replace electrolytes as indicated Avoid nephrotoxic agents, ensure adequate renal perfusion  Bladder outlet obstruction  -s/p foley by urology P: Keep foley in place until after follow up with Urology  Continue flomax, proscar   Fevers / SIRS -low grade fever, new faint infiltrates 10/26, ? ALI vs edema P: Unasyn as above  Follow cultures  Follow up CXR  Lasix per [facility]   DM P: SSI   Disposition / Summary of Today's Plan 07/01/18   Trial of precedex.  Resume klonopin.  Monitor airway closely.  Assess ABG, PCT, lactic.  ? Edema vs ALI.  Abx for penile infection.     Diet: Regular diet if awake Pain/Anxiety/Delirium protocol (if indicated): N/A. VAP protocol (if indicated): N/A. DVT prophylaxis: heparin  GI prophylaxis: N/A. Hyperglycemia protocol: SSI Mobility: Bedrest. Code Status: full. Family Communication: No family available at bedside 10/26.  Labs (personally reviewed)  CBC: Recent Labs  Lab 06/27/18 0446 06/28/18 0457 06/29/18 0339 06/30/18 0553 07/01/18 0627  WBC 11.0* 10.1 11.1* 12.3* 11.5*  HGB 12.3* 12.5* 12.5* 12.4* 11.9*  HCT 41.8 41.5 40.2 41.0 39.8  MCV 103.0* 101.7* 97.8 98.3 98.8  PLT 241 PLATELET CLUMPS NOTED ON SMEAR, UNABLE TO ESTIMATE 201 204 630    Basic Metabolic Panel: Recent Labs  Lab 06/25/18 1140  06/26/18 0502  06/27/18 0446 06/28/18 0457 06/29/18 0339 06/30/18 0553 07/01/18 0627  NA 163*   < > 155*   < > 156* 151* 145 143 145  K 3.7   < > 3.4*   < > 3.6 4.0 3.0* 3.0* 3.3*  CL >130*   < > 126*   < > 127* 122* 116* 111 117*  CO2 23   < > 23   < > 22 21* 21* 21* 19*  GLUCOSE 266*   < > 394*   < > 380* 288* 230* 278* 241*  BUN 8   < > 10   < > 10 15 13 13 16   CREATININE 1.94*   < > 2.24*   < > 2.08* 1.81* 1.69* 1.67* 2.17*  CALCIUM 11.4*   < > 10.2   < > 10.7* 10.5* 10.5* 10.7* 10.8*  MG 2.3  --  2.2  --  2.2 2.1  --   --  2.5*  PHOS   --   --  3.5  --  2.9 2.8 2.4* 2.8  --    < > = values in this interval not displayed.   GFR: CrCl cannot be calculated (Unknown ideal weight.). Recent Labs  Lab 06/27/18 1054 06/28/18 0457 06/29/18 0339 06/30/18 0553 07/01/18 0627  PROCALCITON 0.40 0.44 0.59  --   --   WBC  --  10.1 11.1* 12.3* 11.5*    Liver Function Tests: Recent Labs  Lab 06/25/18 1140  06/27/18 0446 06/28/18 0457 06/29/18 0339 06/30/18 0553 07/01/18 0627  AST 25  --   --   --   --   --  28  ALT 16  --   --   --   --   --  40  ALKPHOS 72  --   --   --   --   --  62  BILITOT 1.1  --   --   --   --   --  0.7  PROT 6.9  --   --   --   --   --  6.2*  ALBUMIN 3.5   < > 3.2* 3.1* 3.1* 3.2* 3.0*   < > = values in this interval not displayed.   No results for input(s): LIPASE, AMYLASE in the last 168 hours. No results for input(s): AMMONIA in the last 168 hours.  ABG    Component Value Date/Time   PHART 7.399 06/25/2018 1158   PCO2ART 39.9 06/25/2018 1158   PO2ART 62.1 (L) 06/25/2018 1158   HCO3 23.5 06/25/2018 1158   TCO2 18.0 09/09/2009 0935   ACIDBASEDEF 0.3 06/25/2018 1158   O2SAT 91.0 06/25/2018 1158     Coagulation Profile: No results for input(s): INR, PROTIME in the last 168 hours.  Cardiac Enzymes: No results for input(s): CKTOTAL, CKMB, CKMBINDEX, TROPONINI in the last 168 hours.  HbA1C: Hemoglobin A1C  Date/Time Value Ref Range Status  08/25/2015 9.9  Final   Hgb A1c MFr Bld  Date/Time Value Ref Range Status  07/19/2017 08:30 AM 5.5 4.6 - 6.5 % Final    Comment:    Glycemic Control Guidelines for People with Diabetes:Non Diabetic:  <6%Goal of Therapy: <7%Additional Action Suggested:  >8%   04/21/2017 10:13 AM 5.7 4.6 - 6.5 % Final    Comment:    Glycemic Control Guidelines for People with Diabetes:Non Diabetic:  <6%Goal of Therapy: <7%Additional Action Suggested:  >8%     CBG: Recent Labs  Lab 06/30/18 2321 07/01/18 0348 07/01/18 0746 07/01/18 1119 07/01/18 1517    GLUCAP 285* 336* 159* 180* 211*    CC time:     Noe Gens, NP-C [facility] Pgr: [phone] or if no answer [phone] 07/01/2018, 3:51 PM

## 2018-07-01 NOTE — Progress Notes (Signed)
PT Cancellation Note  Patient Details Name: Frank Cox MRN: [id number] DOB: [birth year]   Cancelled Treatment:    Reason Eval/Treat Not Completed: Patient not medically ready.  Pt combative and in 4 pt. restraints.  Will see tomorrow as able. 07/01/2018  Donnella Sham, [place] [phone]  (pager) [phone]  (office)   Tessie Fass Vedh Ptacek 07/01/2018, 11:57 AM

## 2018-07-01 NOTE — Progress Notes (Signed)
CPAP (10 cmH2O with 2 Lpm O2 bleed in) placed on patient at this time after receiving verbal orders over [facility] camera MD. MD aware of patients agitation and having restraints, however, to precede with order. Sitter at bedside.

## 2018-07-01 NOTE — Progress Notes (Signed)
[place] TEAM 1 - Stepdown/ICU TEAM  OSSIEL MARCHIO  QVZ:[id number] DOB: 1960-12-26 DOA: 07/05/2018 PCP: Antony Contras, MD    Brief Narrative:  57 y.o. male with a history of bipolar disorder, DM2, and BPH who presented with confusion and multiple falls and was found to have lithium toxicity along with acute kidney injury.  Admitted by PCCM to the ICU, evaluated by Nephrology, and underwent hemodialysis.   Significant Events: 10/18 admit - Foley catheter was placed & he put out about 2 L immediately - emergent dialysis for lithium toxicity 10/19 Lithium normalized - Urology recommended keeping Foley catheter in place and having him follow-up as outpatient 10/20 transfer to SDU 10/24 transfer back to ICU due to agitation  Subjective: Pt became severe agitated last night, and PCCM placed him back on precedex temporarily. Some of his home psych meds have been resumed. He is now off precedex again, but is confused. He is not agitated at this time.   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 - 3000cc urine output last 24hrs, down from 3400 the 24hrs before - cont amiloride for nephrogenic DI - free water via IV stopped last night due to hyperglycemia - cont to trend Na until clearly stable   Recent Labs  Lab 06/27/18 0446 06/28/18 0457 06/29/18 0339 06/30/18 0553 07/01/18 0627  NA 156* 151* 145 143 145    Acute kidney injury secondary to bladder outlet obstruction - foley to STAY IN PLACE - crt appears to be climbing again - resume volume expansion as oral intake not consistent - follow trend   Recent Labs  Lab 06/27/18 0446  06/28/18 0457 06/29/18 0339 06/30/18 0553 07/01/18 0627  CREATININE 2.08* 1.81* 1.69* 1.67* 2.17*    Bladder outlet obstruction Foley catheter was placed while in the ICU - Urology recommends to continue Foley catheter on discharge and follow-up with Urology as outpatient - continue Flomax and Proscar.  DM2 CBGs improving since D5 stopped - follow w/o change at this time   Hypertension Blood pressure controlled  Bipolar disorder Per Psychiatry no lithium - once encephalopathy has resolved patient needs a repeat inpatient psychiatric evaluation to determine appropriate disposition   DVT prophylaxis: SQ heparin  Code Status: FULL CODE Family Communication: no family present at time of exam  Disposition Plan: SDU  Consultants:  PCCM Psychiatry   Antimicrobials:  None presently    Objective: Blood pressure 105/68, pulse 67, temperature 99.6 F (37.6 C), temperature source Oral, resp. rate (!) 35, weight (!) 139.6 kg, SpO2 96 %.  Intake/Output Summary (Last 24 hours) at 07/01/2018 1147 Last data filed at 07/01/2018 1000 Gross per 24 hour  Intake 2861.93 ml  Output 2350 ml  Net 511.93 ml   Filed Weights   06/29/18 1350  Weight: (!) 139.6 kg    Examination: General: No acute respiratory distress - awake but delirious  Lungs: Clear to auscultation bilaterally - no wheezing  Cardiovascular: RRR - no M or rub  Abdomen: obese, protuberant, bs+, no mass, soft Extremities: trace B LE edema   CBC: Recent Labs  Lab 06/29/18 0339 06/30/18 0553 07/01/18 0627  WBC 11.1* 12.3* 11.5*  HGB 12.5* 12.4*  11.9*  HCT 40.2 41.0 39.8  MCV 97.8 98.3 98.8  PLT 201 204 637   Basic Metabolic Panel: Recent Labs  Lab 06/27/18 0446 06/28/18 0457 06/29/18 0339 06/30/18 0553 07/01/18 0627  NA 156* 151* 145 143 145  K 3.6 4.0 3.0* 3.0* 3.3*  CL 127* 122* 116* 111 117*  CO2 22 21* 21* 21* 19*  GLUCOSE 380* 288* 230* 278* 241*  BUN 10 15 13 13 16   CREATININE 2.08* 1.81*  1.69* 1.67* 2.17*  CALCIUM 10.7* 10.5* 10.5* 10.7* 10.8*  MG 2.2 2.1  --   --  2.5*  PHOS 2.9 2.8 2.4* 2.8  --    GFR: CrCl cannot be calculated (Unknown ideal weight.).  Liver Function Tests: Recent Labs  Lab 06/25/18 1140  06/28/18 0457 06/29/18 0339 06/30/18 0553 07/01/18 0627  AST 25  --   --   --   --  28  ALT 16  --   --   --   --  40  ALKPHOS 72  --   --   --   --  62  BILITOT 1.1  --   --   --   --  0.7  PROT 6.9  --   --   --   --  6.2*  ALBUMIN 3.5   < > 3.1* 3.1* 3.2* 3.0*   < > = values in this interval not displayed.    HbA1C: Hemoglobin A1C  Date/Time Value Ref Range Status  08/25/2015 9.9  Final   Hgb A1c MFr Bld  Date/Time Value Ref Range Status  07/19/2017 08:30 AM 5.5 4.6 - 6.5 % Final    Comment:    Glycemic Control Guidelines for People with Diabetes:Non Diabetic:  <6%Goal of Therapy: <7%Additional Action Suggested:  >8%   04/21/2017 10:13 AM 5.7 4.6 - 6.5 % Final    Comment:    Glycemic Control Guidelines for People with Diabetes:Non Diabetic:  <6%Goal of Therapy: <7%Additional Action Suggested:  >8%     CBG: Recent Labs  Lab 06/30/18 1908 06/30/18 2321 07/01/18 0348 07/01/18 0746 07/01/18 1119  GLUCAP 218* 285* 336* 159* 180*    Recent Results (from the past 240 hour(s))  Culture, Urine     Status: None   Collection Time: 06/26/2018  5:49 AM  Result Value Ref Range Status   Specimen Description URINE, CATHETERIZED  Final   Special Requests NONE  Final   Culture   Final    NO GROWTH Performed at [facility], [place] [address], [place], [place] [address]    Report Status 06/26/2018 FINAL  Final  MRSA PCR Screening     Status: None   Collection Time: 06/19/2018 12:02 PM  Result Value Ref Range Status   MRSA by PCR NEGATIVE NEGATIVE Final    Comment:        The GeneXpert MRSA Assay (FDA approved for NASAL specimens only), is one component of a comprehensive MRSA colonization surveillance program. It is not intended to diagnose  MRSA infection nor to guide or monitor treatment for MRSA infections. Performed at [facility], [place] [address] [place], [place] [address]   Culture, blood (routine x 2)     Status: None   Collection Time: 06/25/18 11:39 AM  Result Value Ref Range Status   Specimen Description BLOOD LEFT ARM  Final   Special Requests   Final    BOTTLES DRAWN AEROBIC ONLY Blood Culture adequate volume   Culture   Final  NO GROWTH 5 DAYS Performed at [facility], [place] [address] [place], [place] [address]    Report Status 06/30/2018 FINAL  Final  Culture, blood (routine x 2)     Status: None   Collection Time: 06/25/18 11:45 AM  Result Value Ref Range Status   Specimen Description BLOOD RIGHT HAND  Final   Special Requests   Final    BOTTLES DRAWN AEROBIC ONLY Blood Culture adequate volume   Culture   Final    NO GROWTH 5 DAYS Performed at [facility], [place] [address], [place], [place] [address]    Report Status 06/30/2018 FINAL  Final  Aerobic/Anaerobic Culture (surgical/deep wound)     Status: None   Collection Time: 06/27/18  9:30 AM  Result Value Ref Range Status   Specimen Description PENILE DRAINAGE  Final   Special Requests Normal  Final   Gram Stain   Final    RARE WBC PRESENT,BOTH PMN AND MONONUCLEAR ABUNDANT SQUAMOUS EPITHELIAL CELLS PRESENT FEW GRAM POSITIVE COCCI RARE GRAM POSITIVE RODS Performed at [facility], [address] [place], [place] [address]    Culture   Final    ABUNDANT STAPHYLOCOCCUS LUGDUNENSIS MODERATE ENTEROCOCCUS FAECALIS NO ANAEROBES ISOLATED; CULTURE IN PROGRESS FOR 5 DAYS    Report Status 06/30/2018 FINAL  Final   Organism ID, Bacteria STAPHYLOCOCCUS LUGDUNENSIS  Final   Organism ID, Bacteria ENTEROCOCCUS FAECALIS  Final      Susceptibility   Enterococcus faecalis - MIC*    AMPICILLIN <=2 SENSITIVE Sensitive     VANCOMYCIN 2 SENSITIVE Sensitive     GENTAMICIN SYNERGY SENSITIVE Sensitive     * MODERATE ENTEROCOCCUS  FAECALIS   Staphylococcus lugdunensis - MIC*    CIPROFLOXACIN <=0.5 SENSITIVE Sensitive     ERYTHROMYCIN <=0.25 SENSITIVE Sensitive     GENTAMICIN <=0.5 SENSITIVE Sensitive     OXACILLIN 1 SENSITIVE Sensitive     TETRACYCLINE <=1 SENSITIVE Sensitive     VANCOMYCIN <=0.5 SENSITIVE Sensitive     TRIMETH/SULFA <=10 SENSITIVE Sensitive     CLINDAMYCIN <=0.25 SENSITIVE Sensitive     RIFAMPIN <=0.5 SENSITIVE Sensitive     Inducible Clindamycin NEGATIVE Sensitive     * ABUNDANT STAPHYLOCOCCUS LUGDUNENSIS  Culture, blood (routine x 2)     Status: None (Preliminary result)   Collection Time: 06/28/18 11:34 AM  Result Value Ref Range Status   Specimen Description BLOOD LEFT HAND  Final   Special Requests   Final    BOTTLES DRAWN AEROBIC ONLY Blood Culture adequate volume   Culture   Final    NO GROWTH 3 DAYS Performed at [facility], [address] [place], [place] [address]    Report Status PENDING  Incomplete  Culture, Urine     Status: None   Collection Time: 06/28/18 12:13 PM  Result Value Ref Range Status   Specimen Description URINE, CATHETERIZED  Final   Special Requests NONE  Final   Culture   Final    NO GROWTH Performed at [facility], [address] [place], [place] [address]    Report Status 06/29/2018 FINAL  Final  Culture, blood (routine x 2)     Status: None (Preliminary result)   Collection Time: 06/28/18 12:30 PM  Result Value Ref Range Status   Specimen Description BLOOD RIGHT HAND  Final   Special Requests   Final    BOTTLES DRAWN AEROBIC ONLY Blood Culture adequate volume   Culture  Final    NO GROWTH 3 DAYS Performed at [facility], [place] [address], [place], [place] [address]    Report Status PENDING  Incomplete     Scheduled Meds: . aMILoride  10 mg Oral BID  . aspirin  81 mg Oral Daily  . chlorhexidine  15 mL Mouth Rinse BID  . finasteride  5 mg Oral Daily  . heparin  5,000 Units Subcutaneous Q8H  . insulin aspart  0-20 Units  Subcutaneous Q4H  . insulin aspart  0-5 Units Subcutaneous QHS  . insulin glargine  15 Units Subcutaneous BID  . lamoTRIgine  400 mg Oral QHS  . metoprolol tartrate  100 mg Oral BID  . nitroGLYCERIN  1 inch Topical Q6H  . nystatin   Topical BID  . polyethylene glycol  17 g Oral BID  . potassium chloride  40 mEq Oral Daily  . QUEtiapine  200 mg Oral QHS  . tamsulosin  0.8 mg Oral Daily     LOS: 8 days   Cherene Altes, MD [facility] Office  [phone] Pager - Text Page per Amion  If 7PM-7AM, please contact night-coverage per Amion 07/01/2018, 11:47 AM

## 2018-07-01 NOTE — Progress Notes (Signed)
[facility] Progress Note Patient Name: Frank Cox DOB: Jan 23, 1961 MRN: [id number]   Date of Service  07/01/2018  HPI/Events of Note  CBGs high Unresponsive  eICU Interventions  Dc D5w now that Na normalised Dc precedex , if BP remains low, dc clonidine patch & give NS 250 bolus     Intervention Category Intermediate Interventions: Hyperglycemia - evaluation and treatment  Rakesh V. Alva 07/01/2018, 3:57 AM

## 2018-07-01 NOTE — Progress Notes (Signed)
Pt noted to be tachypneic up to 50 breaths/min and hypoxic in the 80s on 6L nasal cannula. Pt agitated and unable to redirect. PRN Ativan and scheduled klonopin given, MD paged. Orders received for stat chest xray.

## 2018-07-01 NOTE — Progress Notes (Signed)
ABG drawn today was actually done with the pt on RA.  The O2 was hooked up to pts cpap machine. Pt is currently on 2l [place] with sats of 97%. RN made aware.  RT will continue to monitor.

## 2018-07-02 ENCOUNTER — Inpatient Hospital Stay (HOSPITAL_COMMUNITY): Payer: 59

## 2018-07-02 DIAGNOSIS — F319 Bipolar disorder, unspecified: Secondary | ICD-10-CM

## 2018-07-02 LAB — AEROBIC/ANAEROBIC CULTURE W GRAM STAIN (SURGICAL/DEEP WOUND)

## 2018-07-02 LAB — BASIC METABOLIC PANEL
Anion gap: 8 (ref 5–15)
BUN: 24 mg/dL — AB (ref 6–20)
CHLORIDE: 120 mmol/L — AB (ref 98–111)
CO2: 18 mmol/L — ABNORMAL LOW (ref 22–32)
Calcium: 10.9 mg/dL — ABNORMAL HIGH (ref 8.9–10.3)
Creatinine, Ser: 2.86 mg/dL — ABNORMAL HIGH (ref 0.61–1.24)
GFR calc Af Amer: 26 mL/min — ABNORMAL LOW (ref >60)
GFR, EST NON AFRICAN AMERICAN: 23 mL/min — AB (ref >60)
GLUCOSE: 217 mg/dL — AB (ref 70–99)
POTASSIUM: 3.8 mmol/L (ref 3.5–5.1)
Sodium: 146 mmol/L — ABNORMAL HIGH (ref 135–145)

## 2018-07-02 LAB — GLUCOSE, CAPILLARY
GLUCOSE-CAPILLARY: 202 mg/dL — AB (ref 70–99)
GLUCOSE-CAPILLARY: 172 mg/dL — AB (ref 70–99)
Glucose-Capillary: 190 mg/dL — ABNORMAL HIGH (ref 70–99)
Glucose-Capillary: 154 mg/dL — ABNORMAL HIGH (ref 70–99)
Glucose-Capillary: 164 mg/dL — ABNORMAL HIGH (ref 70–99)

## 2018-07-02 LAB — AEROBIC/ANAEROBIC CULTURE (SURGICAL/DEEP WOUND): SPECIAL REQUESTS: NORMAL

## 2018-07-02 LAB — PROCALCITONIN: Procalcitonin: 0.63 ng/mL

## 2018-07-02 LAB — PHOSPHORUS: Phosphorus: 3.2 mg/dL (ref 2.5–4.6)

## 2018-07-02 LAB — MAGNESIUM: Magnesium: 2.8 mg/dL — ABNORMAL HIGH (ref 1.7–2.4)

## 2018-07-02 MED ORDER — INSULIN GLARGINE 100 UNIT/ML ~~LOC~~ SOLN
14.0000 [IU] | Freq: Two times a day (BID) | SUBCUTANEOUS | Status: DC
  Administered 2018-07-02 – 2018-07-04 (×5): 14 [IU] via SUBCUTANEOUS
  Filled 2018-07-02 (×5): qty 0.14

## 2018-07-02 MED ORDER — SODIUM CHLORIDE 0.9 % IV SOLN
3.0000 g | Freq: Two times a day (BID) | INTRAVENOUS | Status: DC
  Administered 2018-07-02 – 2018-07-03 (×2): 3 g via INTRAVENOUS
  Filled 2018-07-02 (×3): qty 3

## 2018-07-02 MED ORDER — LACTATED RINGERS IV SOLN
INTRAVENOUS | Status: DC
  Administered 2018-07-02: 08:00:00 via INTRAVENOUS

## 2018-07-02 MED ORDER — DEXMEDETOMIDINE HCL IN NACL 400 MCG/100ML IV SOLN
0.0000 ug/kg/h | INTRAVENOUS | Status: DC
  Administered 2018-07-02: 0.7 ug/kg/h via INTRAVENOUS
  Administered 2018-07-03: 0.5 ug/kg/h via INTRAVENOUS
  Administered 2018-07-03: 0.9 ug/kg/h via INTRAVENOUS
  Administered 2018-07-03: 0.6 ug/kg/h via INTRAVENOUS
  Administered 2018-07-03: 1.2 ug/kg/h via INTRAVENOUS
  Administered 2018-07-03: 0.9 ug/kg/h via INTRAVENOUS
  Administered 2018-07-03: 1.2 ug/kg/h via INTRAVENOUS
  Administered 2018-07-04: 0.6 ug/kg/h via INTRAVENOUS
  Administered 2018-07-04 (×2): 0.5 ug/kg/h via INTRAVENOUS
  Administered 2018-07-04: 0.4 ug/kg/h via INTRAVENOUS
  Administered 2018-07-05: 0.6 ug/kg/h via INTRAVENOUS
  Administered 2018-07-05: 0.9 ug/kg/h via INTRAVENOUS
  Administered 2018-07-05 (×2): 0.6 ug/kg/h via INTRAVENOUS
  Administered 2018-07-05: 1.2 ug/kg/h via INTRAVENOUS
  Administered 2018-07-06 – 2018-07-07 (×7): 0.6 ug/kg/h via INTRAVENOUS
  Administered 2018-07-08: 0.3 ug/kg/h via INTRAVENOUS
  Filled 2018-07-02 (×28): qty 100

## 2018-07-02 NOTE — Progress Notes (Addendum)
NAME:  Frank Cox, MRN:  [id number], DOB:  [birth year], LOS: 9 ADMISSION DATE:  06/16/2018, CONSULTATION DATE:  06/26/2018 REFERRING MD:  Stark Jock, ED [facility], CHIEF COMPLAINT:  Alerted mental status.  Brief History   57 year old man who requires urgent hemodialysis for acute on chronic lithium toxicity. Has been increasingly confused, experiencing multiple falls over the last week.  Li level 2.37 (upper limit 1.2) and nephrology wishes to dialyze the patient.   He was transferred out of ICU on 10/20.  On 10/24, he had agitation that did not respond to ativan or haldol; therefore, PCCM was called back for transfer to ICU.   Past Medical History  Bipolar affective disorder, T2DM on OHA, HTN, Prostate cancer, OSA on CPAP.  Significant Hospital Events   10/18 Admit with acute renal failure (cr 1.61) Foley catheter > 2 L immediately.  Underwent emergent dialysis for lithium toxicity 10/19 Lithium normalized.   10/20 Tx to SDU 10/24 Tx back to ICU due to agitation 10/26 PCCM called back for AMS, intermittent agitation  Consults: date of consult/date signed off & final recs:  Nephrology 10/18 - recommending HD Poison Control - recommending hydration, following serial Li levels. Psych 10/19 - do not restart lithium, needs psych consult once encephalopathy clears, recommending inpatient psych admission once medically cleared Urology 10/19 - input regarding possible obstructive uropathy, they recommend keeping foley in and follow up as outpatient  Procedures (surgical and bedside):     Significant Diagnostic Tests:  CT head 10/15 > negative. Renal US 10/18 > diabetic glomerular nephropathy.  No obstruction.  Bilateral benign appearing renal cyts.  Micro Data:  Urine 10/18 > negative Blood 10/20 > negative Blood 10/23 >  Urine 10/23 > negative Penile culture 10/22 >> staph lugdunensis (S-, entercoccus (vanco sens)    Antimicrobials:  Zosyn 10/20 > 10/21 Ceftriaxone 10/21 > 10/23 Unasyn  10/26 >>>  Subjective:  Pt on 0.2 mcg's of precedex, on BiPAP overnight.  Resting in bed currently.  Objective   Blood pressure 130/70, pulse 70, temperature 98.3 F (36.8 C), temperature source Oral, resp. rate (!) 31, weight (!) 139.6 kg, SpO2 97 %.        Intake/Output Summary (Last 24 hours) at 07/02/2018 0748 Last data filed at 07/02/2018 0700 Gross per 24 hour  Intake 1518.94 ml  Output 3000 ml  Net -1481.06 ml   Filed Weights   06/29/18 1350  Weight: (!) 139.6 kg    Examination: General:  Adult male in NAD lying in bed on BiPAP HEENT: MM pink/moist, BiPAP mask in place Neuro: Awakens to voice, moves all ext, speaks clearly but not oriented  CV: s1s2 rrr, no m/r/g PULM: even/non-labored, lungs bilaterally clear  HE:RDEY, non-tender, bsx4 active  Extremities: warm/dry, trace to 1+ BLE edema, changes c/w chronic venous stasis Skin: no rashes or lesions   Assessment & Plan:   Agitated Delirium   -intermittent, initially thought related to ETOH withdrawal, AKI, lithium toxicity -bad reaction to haldol P: Continue precedex gtt  PRN ativan  Frequent reorientation  Promote sleep / wake cycle  Penile Drainage  -culture positive for staph, enterococcus P: Continue unasyn as per abx flow sheet above   Acute on Chronic Lithium Toxicity with Acute Toxic Encephalopathy  - s/p HD on 10/18 with normalization of Li levels. P: No further lithium per PSY   Bipolar Disorder  - patient denied suicidal intent.   -S/p psych eval 10/19 P: Continue klonopin, clelexa, seroquel, lamictal as able  Will need PSY input once mental status clears   Probable OSA / OHS P: Continue CPAP QHS   ? Nephrogenic DI from lithium  -resolved  P:  Monitor UOP  Hypertnatremia  - felt to be due to lithium P: Trend BMP  Hypokalemia   P: Monitor, replace as indicated   Acute on Chronic Renal Failure Stage 2 CKD. P: Trend BMP / urinary output Replace electrolytes as  indicated Avoid nephrotoxic agents, ensure adequate renal perfusion LR at 40, hold further lasix   Bladder outlet obstruction  -s/p foley by urology P: Keep foley until after discharge / follow up with Urology  Folmax, proscar  Fevers / SIRS -low grade fever, new faint infiltrates 10/26, ? ALI vs edema P: Unasyn as above Follow cultures  Follow CXR   DM P: SSI, resistant scale Increase lantus to 14 units BID  Disposition / Summary of Today's Plan 07/02/18   Continue precedex, wean off as tolerated.  Gentle IVF's with increase in sr cr.  Adjust lantus.     Diet: Regular diet if awake Pain/Anxiety/Delirium protocol (if indicated): N/A. VAP protocol (if indicated): N/A. DVT prophylaxis: heparin  GI prophylaxis: N/A. Hyperglycemia protocol: SSI Mobility: Bedrest. Code Status: full. Family Communication: No family 10/27.  Labs (personally reviewed)  CBC: Recent Labs  Lab 06/27/18 0446 06/28/18 0457 06/29/18 0339 06/30/18 0553 07/01/18 0627  WBC 11.0* 10.1 11.1* 12.3* 11.5*  HGB 12.3* 12.5* 12.5* 12.4* 11.9*  HCT 41.8 41.5 40.2 41.0 39.8  MCV 103.0* 101.7* 97.8 98.3 98.8  PLT 241 PLATELET CLUMPS NOTED ON SMEAR, UNABLE TO ESTIMATE 201 204 324    Basic Metabolic Panel: Recent Labs  Lab 06/26/18 0502  06/27/18 0446 06/28/18 0457 06/29/18 0339 06/30/18 0553 07/01/18 0627 07/02/18 0229  NA 155*   < > 156* 151* 145 143 145 146*  K 3.4*   < > 3.6 4.0 3.0* 3.0* 3.3* 3.8  CL 126*   < > 127* 122* 116* 111 117* 120*  CO2 23   < > 22 21* 21* 21* 19* 18*  GLUCOSE 394*   < > 380* 288* 230* 278* 241* 217*  BUN 10   < > 10 15 13 13 16  24*  CREATININE 2.24*   < > 2.08* 1.81* 1.69* 1.67* 2.17* 2.86*  CALCIUM 10.2   < > 10.7* 10.5* 10.5* 10.7* 10.8* 10.9*  MG 2.2  --  2.2 2.1  --   --  2.5* 2.8*  PHOS 3.5  --  2.9 2.8 2.4* 2.8  --  3.2   < > = values in this interval not displayed.   GFR: CrCl cannot be calculated (Unknown ideal weight.). Recent Labs  Lab  06/28/18 0457 06/29/18 0339 06/30/18 0553 07/01/18 0627 07/01/18 1654 07/02/18 0229  PROCALCITON 0.44 0.59  --   --  0.58 0.63  WBC 10.1 11.1* 12.3* 11.5*  --   --   LATICACIDVEN  --   --   --   --  1.1  --     Liver Function Tests: Recent Labs  Lab 06/25/18 1140  06/27/18 0446 06/28/18 0457 06/29/18 0339 06/30/18 0553 07/01/18 0627  AST 25  --   --   --   --   --  28  ALT 16  --   --   --   --   --  40  ALKPHOS 72  --   --   --   --   --  62  BILITOT 1.1  --   --   --   --   --  0.7  PROT 6.9  --   --   --   --   --  6.2*  ALBUMIN 3.5   < > 3.2* 3.1* 3.1* 3.2* 3.0*   < > = values in this interval not displayed.   No results for input(s): LIPASE, AMYLASE in the last 168 hours. No results for input(s): AMMONIA in the last 168 hours.  ABG    Component Value Date/Time   PHART 7.343 (L) 07/01/2018 1730   PCO2ART 32.9 07/01/2018 1730   PO2ART 62.5 (L) 07/01/2018 1730   HCO3 17.3 (L) 07/01/2018 1730   TCO2 18.0 09/09/2009 0935   ACIDBASEDEF 7.2 (H) 07/01/2018 1730   O2SAT 91.6 07/01/2018 1730     Coagulation Profile: No results for input(s): INR, PROTIME in the last 168 hours.  Cardiac Enzymes: No results for input(s): CKTOTAL, CKMB, CKMBINDEX, TROPONINI in the last 168 hours.  HbA1C: Hemoglobin A1C  Date/Time Value Ref Range Status  08/25/2015 9.9  Final   Hgb A1c MFr Bld  Date/Time Value Ref Range Status  07/19/2017 08:30 AM 5.5 4.6 - 6.5 % Final    Comment:    Glycemic Control Guidelines for People with Diabetes:Non Diabetic:  <6%Goal of Therapy: <7%Additional Action Suggested:  >8%   04/21/2017 10:13 AM 5.7 4.6 - 6.5 % Final    Comment:    Glycemic Control Guidelines for People with Diabetes:Non Diabetic:  <6%Goal of Therapy: <7%Additional Action Suggested:  >8%     CBG: Recent Labs  Lab 07/01/18 0746 07/01/18 1119 07/01/18 1517 07/01/18 1634 07/01/18 2129  GLUCAP 159* 180* 211* 218* 210*    Noe Gens, NP-C [facility] Pgr: [phone] or if no answer [phone] 07/02/2018, 7:48 AM   Attending: Pt seen and examined and chart review and flow sheets updated/ above note edited where needed  He is stable on ventilator with major issues with agitation which will need to be addressed before extubation attempt and need more imput from psych to be successful here.   No fm at bedside on my rounds   Christinia Gully, MD Pulmonary and [place] [phone] After 5:30 PM or weekends, use [facility]per [phone]

## 2018-07-02 NOTE — Progress Notes (Signed)
eLink Physician-Brief Progress Note Patient Name: BADR PIEDRA DOB: [birth year] MRN: [id number]   Date of Service  07/02/2018  HPI/Events of Note  Diarrhea - Multiple loose/watery stools. Request for Flexiseal.   eICU Interventions  Will order Flexiseal.      Intervention Category Major Interventions: Other:  Lysle Dingwall 07/02/2018, 9:48 PM

## 2018-07-02 NOTE — Progress Notes (Signed)
Pharmacy Antibiotic Note  Frank Cox is a 57 y.o. male admitted on 06/09/2018 with penile drainage.  Pharmacy has been consulted for Unasyn dosing. Penile drainage growing enterococcus faecalis and staphylococcus lugdunesis. WBC 11.5. SCR has worsened today.   Plan: -Decreas Unasyn to 3 gm IV Q 12 hours.  -Monitor renal fx and clinical progress  Weight: (!) 307 lb 12.2 oz (139.6 kg)  Temp (24hrs), Avg:98.7 F (37.1 C), Min:98 F (36.7 C), Max:99.3 F (37.4 C)  Recent Labs  Lab 06/27/18 0446 06/28/18 0457 06/29/18 0339 06/30/18 0553 07/01/18 0627 07/01/18 1654 07/02/18 0229  WBC 11.0* 10.1 11.1* 12.3* 11.5*  --   --   CREATININE 2.08* 1.81* 1.69* 1.67* 2.17*  --  2.86*  LATICACIDVEN  --   --   --   --   --  1.1  --     CrCl cannot be calculated (Unknown ideal weight.).    No Known Allergies  Antimicrobials this admission: Unasyn 10/26 >>   Dose adjustments this admission:   Thank you for allowing pharmacy to be a part of this patient's care.  Albertina Parr, PharmD., BCPS Clinical Pharmacist Clinical phone for 07/02/18 until 3:30pm: [phone] If after 3:30pm, please refer to [facility] for unit-specific pharmacist

## 2018-07-03 ENCOUNTER — Inpatient Hospital Stay (HOSPITAL_COMMUNITY): Payer: 59

## 2018-07-03 DIAGNOSIS — C61 Malignant neoplasm of prostate: Secondary | ICD-10-CM

## 2018-07-03 DIAGNOSIS — R0603 Acute respiratory distress: Secondary | ICD-10-CM

## 2018-07-03 DIAGNOSIS — G4733 Obstructive sleep apnea (adult) (pediatric): Secondary | ICD-10-CM

## 2018-07-03 DIAGNOSIS — R197 Diarrhea, unspecified: Secondary | ICD-10-CM

## 2018-07-03 LAB — POCT I-STAT 3, ART BLOOD GAS (G3+)
ACID-BASE DEFICIT: 10 mmol/L — AB (ref 0.0–2.0)
ACID-BASE DEFICIT: 7 mmol/L — AB (ref 0.0–2.0)
Acid-base deficit: 7 mmol/L — ABNORMAL HIGH (ref 0.0–2.0)
Bicarbonate: 18.5 mmol/L — ABNORMAL LOW (ref 20.0–28.0)
Bicarbonate: 20.1 mmol/L (ref 20.0–28.0)
Bicarbonate: 18.6 mmol/L — ABNORMAL LOW (ref 20.0–28.0)
O2 SAT: 97 %
O2 Saturation: 92 %
O2 Saturation: 94 %
PCO2 ART: 36.5 mmHg (ref 32.0–48.0)
PH ART: 7.146 — AB (ref 7.350–7.450)
PH ART: 7.314 — AB (ref 7.350–7.450)
Patient temperature: 103
Patient temperature: 98.4
TCO2: 20 mmol/L — ABNORMAL LOW (ref 22–32)
TCO2: 21 mmol/L — ABNORMAL LOW (ref 22–32)
TCO2: 20 mmol/L — ABNORMAL LOW (ref 22–32)
pCO2 arterial: 53.6 mmHg — ABNORMAL HIGH (ref 32.0–48.0)
pCO2 arterial: 49.6 mmHg — ABNORMAL HIGH (ref 32.0–48.0)
pH, Arterial: 7.229 — ABNORMAL LOW (ref 7.350–7.450)
pO2, Arterial: 111 mmHg — ABNORMAL HIGH (ref 83.0–108.0)
pO2, Arterial: 87 mmHg (ref 83.0–108.0)
pO2, Arterial: 74 mmHg — ABNORMAL LOW (ref 83.0–108.0)

## 2018-07-03 LAB — MAGNESIUM
MAGNESIUM: 2.9 mg/dL — AB (ref 1.7–2.4)
MAGNESIUM: 2.8 mg/dL — AB (ref 1.7–2.4)

## 2018-07-03 LAB — GLUCOSE, CAPILLARY
GLUCOSE-CAPILLARY: 217 mg/dL — AB (ref 70–99)
GLUCOSE-CAPILLARY: 295 mg/dL — AB (ref 70–99)
Glucose-Capillary: 247 mg/dL — ABNORMAL HIGH (ref 70–99)
Glucose-Capillary: 316 mg/dL — ABNORMAL HIGH (ref 70–99)
Glucose-Capillary: 282 mg/dL — ABNORMAL HIGH (ref 70–99)

## 2018-07-03 LAB — BASIC METABOLIC PANEL
Anion gap: 10 (ref 5–15)
BUN: 32 mg/dL — ABNORMAL HIGH (ref 6–20)
CHLORIDE: 124 mmol/L — AB (ref 98–111)
CO2: 19 mmol/L — ABNORMAL LOW (ref 22–32)
CREATININE: 3.15 mg/dL — AB (ref 0.61–1.24)
Calcium: 10.4 mg/dL — ABNORMAL HIGH (ref 8.9–10.3)
GFR calc non Af Amer: 20 mL/min — ABNORMAL LOW (ref >60)
GFR, EST AFRICAN AMERICAN: 23 mL/min — AB (ref >60)
Glucose, Bld: 304 mg/dL — ABNORMAL HIGH (ref 70–99)
POTASSIUM: 4.3 mmol/L (ref 3.5–5.1)
SODIUM: 153 mmol/L — AB (ref 135–145)

## 2018-07-03 LAB — CULTURE, BLOOD (ROUTINE X 2)
CULTURE: NO GROWTH
Culture: NO GROWTH
SPECIAL REQUESTS: ADEQUATE
SPECIAL REQUESTS: ADEQUATE

## 2018-07-03 LAB — CBC
HCT: 41.4 % (ref 39.0–52.0)
HEMOGLOBIN: 12.2 g/dL — AB (ref 13.0–17.0)
MCH: 30.3 pg (ref 26.0–34.0)
MCHC: 29.5 g/dL — AB (ref 30.0–36.0)
MCV: 103 fL — ABNORMAL HIGH (ref 80.0–100.0)
PLATELETS: 112 10*3/uL — AB (ref 150–400)
RBC: 4.02 MIL/uL — AB (ref 4.22–5.81)
RDW: 14.5 % (ref 11.5–15.5)
WBC: 10 10*3/uL (ref 4.0–10.5)
nRBC: 0.2 % (ref 0.0–0.2)

## 2018-07-03 LAB — PHOSPHORUS
PHOSPHORUS: 4.4 mg/dL (ref 2.5–4.6)
Phosphorus: 4.8 mg/dL — ABNORMAL HIGH (ref 2.5–4.6)

## 2018-07-03 LAB — C DIFFICILE QUICK SCREEN W PCR REFLEX
C DIFFICLE (CDIFF) ANTIGEN: NEGATIVE
C Diff interpretation: NOT DETECTED
C Diff toxin: NEGATIVE

## 2018-07-03 LAB — PROCALCITONIN: PROCALCITONIN: 0.47 ng/mL

## 2018-07-03 MED ORDER — STERILE WATER FOR INJECTION IV SOLN
INTRAVENOUS | Status: DC
  Administered 2018-07-03: 05:00:00 via INTRAVENOUS
  Filled 2018-07-03: qty 850

## 2018-07-03 MED ORDER — SODIUM CHLORIDE 0.9 % IV SOLN
2.0000 g | INTRAVENOUS | Status: AC
  Administered 2018-07-03: 2 g via INTRAVENOUS
  Filled 2018-07-03: qty 2

## 2018-07-03 MED ORDER — ACETAMINOPHEN 325 MG PO TABS
650.0000 mg | ORAL_TABLET | Freq: Four times a day (QID) | ORAL | Status: DC | PRN
  Administered 2018-07-03 – 2018-07-15 (×9): 650 mg
  Filled 2018-07-03 (×10): qty 2

## 2018-07-03 MED ORDER — MIDAZOLAM HCL 2 MG/2ML IJ SOLN
2.0000 mg | INTRAMUSCULAR | Status: AC | PRN
  Administered 2018-07-03 – 2018-07-05 (×3): 2 mg via INTRAVENOUS
  Filled 2018-07-03 (×2): qty 2

## 2018-07-03 MED ORDER — QUETIAPINE FUMARATE 100 MG PO TABS: 100.0000 mg | ORAL_TABLET | Freq: Every day | ORAL | Status: DC

## 2018-07-03 MED ORDER — LACTATED RINGERS IV BOLUS
500.0000 mL | Freq: Once | INTRAVENOUS | Status: AC
  Administered 2018-07-03: 500 mL via INTRAVENOUS

## 2018-07-03 MED ORDER — QUETIAPINE FUMARATE 25 MG PO TABS
25.0000 mg | ORAL_TABLET | Freq: Every day | ORAL | Status: DC
  Administered 2018-07-03 – 2018-07-08 (×6): 25 mg
  Filled 2018-07-03 (×6): qty 1

## 2018-07-03 MED ORDER — VANCOMYCIN HCL 10 G IV SOLR
2000.0000 mg | INTRAVENOUS | Status: AC
  Administered 2018-07-03: 2000 mg via INTRAVENOUS
  Filled 2018-07-03: qty 2000

## 2018-07-03 MED ORDER — ROCURONIUM BROMIDE 50 MG/5ML IV SOLN
50.0000 mg | Freq: Once | INTRAVENOUS | Status: AC
  Administered 2018-07-03: 50 mg via INTRAVENOUS

## 2018-07-03 MED ORDER — PHENYLEPHRINE HCL-NACL 10-0.9 MG/250ML-% IV SOLN
0.0000 ug/min | INTRAVENOUS | Status: DC
  Administered 2018-07-03: 20 ug/min via INTRAVENOUS
  Administered 2018-07-03: 220 ug/min via INTRAVENOUS
  Administered 2018-07-03: 50 ug/min via INTRAVENOUS
  Administered 2018-07-03: 200 ug/min via INTRAVENOUS
  Filled 2018-07-03 (×2): qty 250
  Filled 2018-07-03: qty 500

## 2018-07-03 MED ORDER — ORAL CARE MOUTH RINSE
15.0000 mL | OROMUCOSAL | Status: DC
  Administered 2018-07-03 – 2018-07-13 (×100): 15 mL via OROMUCOSAL

## 2018-07-03 MED ORDER — VANCOMYCIN 50 MG/ML ORAL SOLUTION
125.0000 mg | Freq: Four times a day (QID) | ORAL | Status: DC
  Administered 2018-07-03 (×2): 125 mg via ORAL
  Filled 2018-07-03 (×3): qty 2.5

## 2018-07-03 MED ORDER — SODIUM CHLORIDE 0.9 % IV SOLN
1.0000 g | INTRAVENOUS | Status: DC
  Administered 2018-07-04 – 2018-07-05 (×2): 1 g via INTRAVENOUS
  Filled 2018-07-03 (×3): qty 1

## 2018-07-03 MED ORDER — FENTANYL CITRATE (PF) 100 MCG/2ML IJ SOLN
100.0000 ug | Freq: Once | INTRAMUSCULAR | Status: AC
  Administered 2018-07-03: 100 ug via INTRAVENOUS

## 2018-07-03 MED ORDER — INSULIN ASPART 100 UNIT/ML ~~LOC~~ SOLN
0.0000 [IU] | SUBCUTANEOUS | Status: DC
  Administered 2018-07-04 (×2): 20 [IU] via SUBCUTANEOUS
  Administered 2018-07-04: 11 [IU] via SUBCUTANEOUS
  Administered 2018-07-04: 7 [IU] via SUBCUTANEOUS
  Administered 2018-07-04: 20 [IU] via SUBCUTANEOUS
  Administered 2018-07-04: 7 [IU] via SUBCUTANEOUS
  Administered 2018-07-04 – 2018-07-05 (×2): 15 [IU] via SUBCUTANEOUS
  Administered 2018-07-05 (×2): 7 [IU] via SUBCUTANEOUS
  Administered 2018-07-05 (×2): 11 [IU] via SUBCUTANEOUS
  Administered 2018-07-05: 3 [IU] via SUBCUTANEOUS
  Administered 2018-07-06: 11 [IU] via SUBCUTANEOUS
  Administered 2018-07-06: 7 [IU] via SUBCUTANEOUS

## 2018-07-03 MED ORDER — VANCOMYCIN HCL 10 G IV SOLR
1750.0000 mg | INTRAVENOUS | Status: DC
  Administered 2018-07-05: 1750 mg via INTRAVENOUS
  Filled 2018-07-03: qty 1750

## 2018-07-03 MED ORDER — LAMOTRIGINE 100 MG PO TABS
400.0000 mg | ORAL_TABLET | Freq: Every day | ORAL | Status: DC
  Administered 2018-07-03 – 2018-07-04 (×2): 400 mg
  Filled 2018-07-03 (×2): qty 4

## 2018-07-03 MED ORDER — ADULT MULTIVITAMIN LIQUID CH
15.0000 mL | Freq: Every day | ORAL | Status: DC
  Administered 2018-07-03: 15 mL via ORAL
  Filled 2018-07-03: qty 15

## 2018-07-03 MED ORDER — PRO-STAT SUGAR FREE PO LIQD
30.0000 mL | Freq: Two times a day (BID) | ORAL | Status: DC
  Administered 2018-07-03: 30 mL
  Filled 2018-07-03: qty 30

## 2018-07-03 MED ORDER — LIDOCAINE HCL (CARDIAC) PF 100 MG/5ML IV SOSY
100.0000 mg | PREFILLED_SYRINGE | Freq: Once | INTRAVENOUS | Status: AC
  Administered 2018-07-03: 100 mg via INTRAVENOUS

## 2018-07-03 MED ORDER — PRO-STAT SUGAR FREE PO LIQD
60.0000 mL | Freq: Two times a day (BID) | ORAL | Status: DC
  Administered 2018-07-03 – 2018-07-11 (×16): 60 mL
  Filled 2018-07-03 (×16): qty 60

## 2018-07-03 MED ORDER — SODIUM BICARBONATE 8.4 % IV SOLN
100.0000 meq | Freq: Once | INTRAVENOUS | Status: AC
  Administered 2018-07-03: 100 meq via INTRAVENOUS
  Filled 2018-07-03: qty 50

## 2018-07-03 MED ORDER — METRONIDAZOLE IN NACL 5-0.79 MG/ML-% IV SOLN
500.0000 mg | Freq: Three times a day (TID) | INTRAVENOUS | Status: DC
  Administered 2018-07-03 – 2018-07-06 (×9): 500 mg via INTRAVENOUS
  Filled 2018-07-03 (×9): qty 100

## 2018-07-03 MED ORDER — DEXTROSE 5 % IV SOLN
INTRAVENOUS | Status: DC
  Administered 2018-07-03 – 2018-07-04 (×3): via INTRAVENOUS

## 2018-07-03 MED ORDER — FOLIC ACID 5 MG/ML IJ SOLN
1.0000 mg | Freq: Every day | INTRAMUSCULAR | Status: DC
  Administered 2018-07-03 – 2018-07-10 (×8): 1 mg via INTRAVENOUS
  Filled 2018-07-03 (×9): qty 0.2

## 2018-07-03 MED ORDER — METOPROLOL TARTRATE 50 MG PO TABS: 100.0000 mg | ORAL_TABLET | Freq: Two times a day (BID) | ORAL | Status: DC

## 2018-07-03 MED ORDER — QUETIAPINE FUMARATE 25 MG PO TABS: 25.0000 mg | ORAL_TABLET | Freq: Every day | ORAL | Status: DC

## 2018-07-03 MED ORDER — FENTANYL 2500MCG IN NS 250ML (10MCG/ML) PREMIX INFUSION
25.0000 ug/h | INTRAVENOUS | Status: DC
  Administered 2018-07-03: 50 ug/h via INTRAVENOUS
  Administered 2018-07-04: 200 ug/h via INTRAVENOUS
  Administered 2018-07-04: 250 ug/h via INTRAVENOUS
  Administered 2018-07-05 (×2): 150 ug/h via INTRAVENOUS
  Administered 2018-07-06 (×3): 250 ug/h via INTRAVENOUS
  Administered 2018-07-07: 400 ug/h via INTRAVENOUS
  Administered 2018-07-07 (×2): 250 ug/h via INTRAVENOUS
  Administered 2018-07-08: 350 ug/h via INTRAVENOUS
  Administered 2018-07-08: 325 ug/h via INTRAVENOUS
  Administered 2018-07-08: 400 ug/h via INTRAVENOUS
  Administered 2018-07-09: 25 ug/h via INTRAVENOUS
  Filled 2018-07-03 (×15): qty 250

## 2018-07-03 MED ORDER — FAMOTIDINE IN NACL 20-0.9 MG/50ML-% IV SOLN
20.0000 mg | Freq: Every day | INTRAVENOUS | Status: DC
  Administered 2018-07-03: 20 mg via INTRAVENOUS
  Filled 2018-07-03: qty 50

## 2018-07-03 MED ORDER — ASPIRIN 81 MG PO CHEW
81.0000 mg | CHEWABLE_TABLET | Freq: Every day | ORAL | Status: DC
  Administered 2018-07-04 – 2018-07-17 (×14): 81 mg
  Filled 2018-07-03 (×15): qty 1

## 2018-07-03 MED ORDER — VITAL HIGH PROTEIN PO LIQD
1000.0000 mL | ORAL | Status: DC
  Administered 2018-07-03 – 2018-07-11 (×9): 1000 mL

## 2018-07-03 MED ORDER — THIAMINE HCL 100 MG/ML IJ SOLN
100.0000 mg | INTRAMUSCULAR | Status: DC
  Administered 2018-07-03 – 2018-07-10 (×8): 100 mg via INTRAVENOUS
  Filled 2018-07-03 (×8): qty 2

## 2018-07-03 MED ORDER — FENTANYL CITRATE (PF) 100 MCG/2ML IJ SOLN
50.0000 ug | Freq: Once | INTRAMUSCULAR | Status: AC
  Administered 2018-07-03: 50 ug via INTRAVENOUS

## 2018-07-03 MED ORDER — CLONAZEPAM 0.5 MG PO TABS
0.5000 mg | ORAL_TABLET | Freq: Three times a day (TID) | ORAL | Status: DC
  Administered 2018-07-03 (×2): 0.5 mg
  Filled 2018-07-03 (×2): qty 1

## 2018-07-03 MED ORDER — SODIUM CHLORIDE 0.9 % IV SOLN
INTRAVENOUS | Status: DC | PRN
  Administered 2018-07-03: 500 mL via INTRAVENOUS
  Administered 2018-07-07 – 2018-07-08 (×3): 250 mL via INTRAVENOUS
  Administered 2018-07-09: 500 mL via INTRAVENOUS

## 2018-07-03 MED ORDER — FENTANYL CITRATE (PF) 100 MCG/2ML IJ SOLN
INTRAMUSCULAR | Status: AC
  Administered 2018-07-03: 100 ug via INTRAVENOUS
  Filled 2018-07-03: qty 2

## 2018-07-03 MED ORDER — MIDAZOLAM HCL 2 MG/2ML IJ SOLN
INTRAMUSCULAR | Status: AC
  Administered 2018-07-03: 2 mg via INTRAVENOUS
  Filled 2018-07-03: qty 4

## 2018-07-03 MED ORDER — FENTANYL CITRATE (PF) 100 MCG/2ML IJ SOLN
100.0000 ug | INTRAMUSCULAR | Status: AC | PRN
  Administered 2018-07-03 – 2018-07-08 (×3): 100 ug via INTRAVENOUS
  Filled 2018-07-03 (×2): qty 2

## 2018-07-03 MED ORDER — FENTANYL BOLUS VIA INFUSION
50.0000 ug | INTRAVENOUS | Status: DC | PRN
  Administered 2018-07-07 – 2018-07-08 (×3): 50 ug via INTRAVENOUS
  Filled 2018-07-03: qty 50

## 2018-07-03 MED ORDER — FENTANYL CITRATE (PF) 100 MCG/2ML IJ SOLN
100.0000 ug | INTRAMUSCULAR | Status: DC | PRN
  Administered 2018-07-03: 100 ug via INTRAVENOUS
  Filled 2018-07-03: qty 2

## 2018-07-03 MED ORDER — ADULT MULTIVITAMIN LIQUID CH
15.0000 mL | Freq: Every day | ORAL | Status: DC
  Administered 2018-07-04 – 2018-07-17 (×14): 15 mL
  Filled 2018-07-03 (×14): qty 15

## 2018-07-03 MED ORDER — VITAL HIGH PROTEIN PO LIQD
1000.0000 mL | ORAL | Status: DC
  Administered 2018-07-03: 1000 mL

## 2018-07-03 MED ORDER — CHLORHEXIDINE GLUCONATE 0.12% ORAL RINSE (MEDLINE KIT)
15.0000 mL | Freq: Two times a day (BID) | OROMUCOSAL | Status: DC
  Administered 2018-07-03 – 2018-07-13 (×20): 15 mL via OROMUCOSAL

## 2018-07-03 MED ORDER — MIDAZOLAM HCL 2 MG/2ML IJ SOLN
2.0000 mg | INTRAMUSCULAR | Status: DC | PRN
  Administered 2018-07-03 – 2018-07-08 (×21): 2 mg via INTRAVENOUS
  Filled 2018-07-03 (×22): qty 2

## 2018-07-03 MED ORDER — FREE WATER
200.0000 mL | Freq: Three times a day (TID) | Status: DC
  Administered 2018-07-03 – 2018-07-04 (×4): 200 mL

## 2018-07-03 MED ORDER — CITALOPRAM HYDROBROMIDE 10 MG PO TABS
40.0000 mg | ORAL_TABLET | Freq: Every day | ORAL | Status: DC
  Administered 2018-07-04: 40 mg
  Filled 2018-07-03: qty 4

## 2018-07-03 MED ORDER — CLONAZEPAM 0.5 MG PO TABS
0.5000 mg | ORAL_TABLET | Freq: Three times a day (TID) | ORAL | Status: DC
  Administered 2018-07-03: 0.5 mg via ORAL
  Filled 2018-07-03: qty 1

## 2018-07-03 MED ORDER — ETOMIDATE 2 MG/ML IV SOLN
20.0000 mg | Freq: Once | INTRAVENOUS | Status: AC
  Administered 2018-07-03: 20 mg via INTRAVENOUS

## 2018-07-03 NOTE — Progress Notes (Signed)
[facility] Progress Note    Assessment/ Plan:   Pt is a 57 y.o. yo male with bipolar on lithium who was admitted on 06/28/2018 with decreased MS- lithium level 2.37 and crt of 1.75    1. Renal- HD times one on 10/18 for lithium toxicity, level normalized.  Then discovered to have BOO s/p foley - then  polyuria and hypernatremia either due to post obs diuresis or nephrogenic DI from lithium (when pt lucid he did report longstanding thirst so think is neph DI)- UOP slowed down on amiloride for nephrogenic DI and follow.   - last dose of amiloride 10/28 - Will check free water clearance in the urine - Free water deficit of 6.5L but that does not include any ongoing losses. - Continue the free water 259m q8hr - Will also start d5W @ 1753mhr - Urine microscopy shows a few pigmented casts but not classic for ATN; hopefully he is still early in the process and it can be reversed with hydration.  2. HTN/vol- Actually BP is now on low side. 3. Anemia- not an issue 4. ID-  Cultures and rocephin per primary - penile drainage enterococcus faecalis and staph lugdunensis on cefepime, flagyl, Unasyn and Vancomycin (doesn't appear to have been given yet) 5. Hypernatremia - either due to BOO  and post obstr diuresis (less likely )  vs DI from lithium (more likely)-  Had been improving and now worse again. 6. Hypokalemia- repleting 7. Psych- behaviors requiring transfer to ICU and IV sedation   Subjective:   Sedated on vent   Objective:   BP (!) 114/95   Pulse (!) 116   Temp (!) 102.6 F (39.2 C) (Axillary)   Resp (!) 23   Ht 6' 2"  (1.88 m)   Wt (!) 139.6 kg   SpO2 95%   BMI 39.51 kg/m   Intake/Output Summary (Last 24 hours) at 07/03/2018 1638 Last data filed at 07/03/2018 1627 Gross per 24 hour  Intake 2537.68 ml  Output 2230 ml  Net 307.68 ml   Weight change:   Physical Exam: General: resting, restraints  Heart: RRR Lungs: clear Abdomen: soft, non  tender Extremities: no edema Dialysis Access: had right subclavian temp cath  - now removed  Imaging: Dg Chest Port 1 View  Result Date: 07/03/2018 CLINICAL DATA:  89ear old male with enteric tube placement. EXAM: PORTABLE ABDOMEN - 1 VIEW; PORTABLE CHEST - 1 VIEW COMPARISON:  Chest radiograph dated 07/02/2018 FINDINGS: Endotracheal tube with tip approximately 5 cm above the carina. An enteric tube extends below the diaphragm making a single turn in the left upper abdomen with tip extending inferiorly beyond the inferior margin of the image, likely in the distal stomach. There is mild cardiomegaly with mild vascular congestion. Right mid lung field linear atelectasis/scarring. No focal consolidation, pleural effusion, or pneumothorax. No acute osseous pathology. IMPRESSION: 1. Endotracheal tube above the carina. Enteric tube the left hemiabdomen likely in the distal stomach. The tip of the enteric tube is beyond the inferior margin of the image. 2. Cardiomegaly with mild vascular congestion. Electronically Signed   By: ArAnner Crete.D.   On: 07/03/2018 04:42   Dg Chest Port 1 View  Result Date: 07/02/2018 CLINICAL DATA:  Acute respiratory failure with hypoxia EXAM: PORTABLE CHEST 1 VIEW COMPARISON:  07/01/2018 FINDINGS: Low lung volumes. Lower lobe atelectasis. No frank interstitial edema. Cardiomegaly.  No pleural effusion or pneumothorax. IMPRESSION: Low lung volumes with lower lobe atelectasis. Electronically Signed   By: SrBertis Ruddy  [place] Pink M.D.   On: 07/02/2018 09:22   Dg Abd Portable 1v  Result Date: 07/03/2018 CLINICAL DATA:  57 year old male with enteric tube placement. EXAM: PORTABLE ABDOMEN - 1 VIEW; PORTABLE CHEST - 1 VIEW COMPARISON:  Chest radiograph dated 07/02/2018 FINDINGS: Endotracheal tube with tip approximately 5 cm above the carina. An enteric tube extends below the diaphragm making a single turn in the left upper abdomen with tip extending inferiorly beyond the inferior  margin of the image, likely in the distal stomach. There is mild cardiomegaly with mild vascular congestion. Right mid lung field linear atelectasis/scarring. No focal consolidation, pleural effusion, or pneumothorax. No acute osseous pathology. IMPRESSION: 1. Endotracheal tube above the carina. Enteric tube the left hemiabdomen likely in the distal stomach. The tip of the enteric tube is beyond the inferior margin of the image. 2. Cardiomegaly with mild vascular congestion. Electronically Signed   By: Anner Crete M.D.   On: 07/03/2018 04:42    Labs: BMET Recent Labs  Lab 06/27/18 0446 06/28/18 0457 06/29/18 0339 06/30/18 0553 07/01/18 0627 07/02/18 0229 07/03/18 0710 07/03/18 0953  NA 156* 151* 145 143 145 146* 153*  --   K 3.6 4.0 3.0* 3.0* 3.3* 3.8 4.3  --   CL 127* 122* 116* 111 117* 120* 124*  --   CO2 22 21* 21* 21* 19* 18* 19*  --   GLUCOSE 380* 288* 230* 278* 241* 217* 304*  --   BUN 10 15 13 13 16  24* 32*  --   CREATININE 2.08* 1.81* 1.69* 1.67* 2.17* 2.86* 3.15*  --   CALCIUM 10.7* 10.5* 10.5* 10.7* 10.8* 10.9* 10.4*  --   PHOS 2.9 2.8 2.4* 2.8  --  3.2  --  4.8*   CBC Recent Labs  Lab 06/29/18 0339 06/30/18 0553 07/01/18 0627 07/03/18 0710  WBC 11.1* 12.3* 11.5* 10.0  HGB 12.5* 12.4* 11.9* 12.2*  HCT 40.2 41.0 39.8 41.4  MCV 97.8 98.3 98.8 103.0*  PLT 201 204 179 112*    Medications:    . [START ON 07/04/2018] aspirin  81 mg Per Tube Daily  . chlorhexidine gluconate (MEDLINE KIT)  15 mL Mouth Rinse BID  . [START ON 07/04/2018] citalopram  40 mg Per Tube Daily  . clonazePAM  0.5 mg Per Tube TID  . feeding supplement (PRO-STAT SUGAR FREE 64)  60 mL Per Tube BID  . finasteride  5 mg Oral Daily  . folic acid  1 mg Intravenous Daily  . free water  200 mL Per Tube Q8H  . heparin  5,000 Units Subcutaneous Q8H  . insulin aspart  0-20 Units Subcutaneous TID WC  . insulin aspart  0-5 Units Subcutaneous QHS  . insulin glargine  14 Units Subcutaneous BID  .  lamoTRIgine  400 mg Per Tube QHS  . mouth rinse  15 mL Mouth Rinse 10 times per day  . metoprolol tartrate  100 mg Per Tube BID  . [START ON 07/04/2018] multivitamin  15 mL Per Tube Daily  . nystatin   Topical BID  . QUEtiapine  25 mg Per Tube QHS  . tamsulosin  0.8 mg Oral Daily  . thiamine injection  100 mg Intravenous Q24H      Otelia Santee, MD 07/03/2018, 4:38 PM

## 2018-07-03 NOTE — Progress Notes (Signed)
[facility] Progress Note Patient Name: Frank Cox DOB: 1961/08/27 MRN: [id number]   Date of Service  07/03/2018  HPI/Events of Note  ABG on 80%/PRVC 18/TV 650/P 5 = 7.14/53.9/112/18.3. ABG c/w mixed metabolic and respiratory acidosis.   eICU Interventions  Will order: 1. Increase PRVC rate to 24. 2. NaHCO3 100 meq IV now.  3. NaHCO3 IV infusion to run IV at 75 mL/hour.  4. Repeat ABG at 6 AM.      Intervention Category Major Interventions: Respiratory failure - evaluation and management  Fatima Fedie Eugene 07/03/2018, 4:49 AM

## 2018-07-03 NOTE — Progress Notes (Signed)
BP continues to be low. MD paged, a second bolus given and Neo has been started. Kayd Launer, Rande Brunt, RN

## 2018-07-03 NOTE — Progress Notes (Addendum)
NAME:  Frank Cox, MRN:  [id number], DOB:  11/09/1960, LOS: 10 ADMISSION DATE:  06/29/2018, CONSULTATION DATE:  06/08/2018 REFERRING MD:  Stark Jock, ED [facility], CHIEF COMPLAINT:  Alerted mental status.  Brief History   57 year old man who requires urgent hemodialysis for acute on chronic lithium toxicity. Has been increasingly confused, experiencing multiple falls over the last week.  Li level 2.37 (upper limit 1.2) and nephrology wishes to dialyze the patient.   He was transferred out of ICU on 10/20.  On 10/24, he had agitation that did not respond to ativan or haldol; therefore, PCCM was called back for transfer to ICU.  Past Medical History  Bipolar affective disorder, T2DM on OHA, HTN, Prostate cancer, OSA on CPAP.  Significant Hospital Events   10/18 Admit with acute renal failure (cr 1.61) Foley catheter > 2 L immediately.  Underwent emergent dialysis for lithium toxicity 10/19 Lithium normalized.   10/20 Tx to SDU 10/24 Tx back to ICU due to agitation 10/26 PCCM called back for AMS, intermittent agitation 10/28 Intubated   Consults: date of consult/date signed off & final recs:  Nephrology 10/18 - recommending HD Poison Control - recommending hydration, following serial Li levels. Psych 10/19 - do not restart lithium, needs psych consult once encephalopathy clears, recommending inpatient psych admission once medically cleared Urology 10/19 - input regarding possible obstructive uropathy, they recommend keeping foley in and follow up as outpatient  Procedures (surgical and bedside):     Significant Diagnostic Tests:  CT head 10/15 > negative. Renal US 10/18 > diabetic glomerular nephropathy.  No obstruction.  Bilateral benign appearing renal cyts.  Micro Data:  Urine 10/18 > negative Blood 10/20 > negative Blood 10/23 >  Urine 10/23 > negative Penile culture 10/22 >> staph lugdunensis (S-, entercoccus (vanco sens)  Antimicrobials:  Zosyn 10/20 > 10/21 Ceftriaxone 10/21 >  10/23 Unasyn 10/26 >>> Oral Vanco 10/28 (pending c diff studies)   Subjective:  Intubated overnight. Sedated on mechanical ventilation. Fever tmax 103. Increased loose stools   Objective   Blood pressure 112/71, pulse 99, temperature (!) 103 F (39.4 C), temperature source Axillary, resp. rate (!) 28, height 6\' 2"  (1.88 m), weight (!) 139.6 kg, SpO2 96 %.    Vent Mode: PRVC FiO2 (%):  [80 %-100 %] 80 % Set Rate:  [18 bmp-24 bmp] 24 bmp Vt Set:  [650 mL] 650 mL PEEP:  [5 cmH20] 5 cmH20 Plateau Pressure:  [18 cmH20-24 cmH20] 24 cmH20   Intake/Output Summary (Last 24 hours) at 07/03/2018 0813 Last data filed at 07/03/2018 0800 Gross per 24 hour  Intake 1723.66 ml  Output 2950 ml  Net -1226.34 ml   Filed Weights   06/29/18 1350  Weight: (!) 139.6 kg    Examination: General appearance: 57 y.o., male, intubated on mech ventilation  Eyes: anicteric sclerae, moist conjunctivae; pupils pinpoint, reactive  HENT: NCAT; oropharynx, ETT in place, MMM, Neck: Trachea midline, supple, large neck  Lungs: tachyacpenic, uncomfortable, labored respiratory effort, abdominal breathing  CV: regular, tachy, S1, S2, no murmur heard over vented breath sounds  Abdomen: Soft, non-tender; mildly distended, BS quite  Extremities: BL LE edema  Skin: Normal temperature, turgor and texture; no rash Psych: unable to assess Neuro: intubated, sedated, not following commands   Assessment & Plan:   Possible Sepsis, unclear source, possible aspiration, small right sided infiltrate on CXR post intubation, could represent atelectasis, increased liquid stools, flexiseal placed, fever 103F, prior cx with staph and enterococcus from penile  discharge`  - Fever Tmax 103F P: Continue unasyn for now, possible aspiration   Pan-culture C diff stool testing  Start vanco oral emprically  We must establish timing of current foley? And whether or not it has been exchanged prior to the culture results. If not, will  need foley exchanged.  Repeat Urine Cx pending   CXR reviewed - The patient's images have been independently reviewed by me.    Acute Hypoxemic Hypercarbic Respiratory Failure  Assume worsening PCO2 likely from mental status P: On MV, Sedated RASS Goal -1 ABG this AM better   Acute toxic, metabolic encephalopathy secondary to above  Agitated Delirium   -intermittent, initially thought related to ETOH withdrawal, AKI, lithium toxicity P: Sedation goals, RASS -1 Added fent ggt  Continue precedex ggt  Dose reduced home psych meds  Fix metabolic derrangements   Penile Drainage, remains possible source of infection   -culture positive for staph, enterococcus P: - unasyn for now  - may need foley exchanged? Will talk with nursing   Acute on Chronic Lithium Toxicity with Acute Toxic Encephalopathy  - s/p HD on 10/18 with normalization of Li levels. P: Improved with dialysis  Bipolar Disorder  - patient denied suicidal intent.   -S/p psych eval 10/19 P: Dose reduced home psych meds  Discussed with pharmacy  Decreased seroquel  Currently intubated and sedated   Probable OSA / OHS P: Intubated   Acute on Chronic Renal Failure, Stage 2 CKD Nephrogenic DI from lithium, ?resolved Hypertnatremia, possible FWD, Na+ load given Hyperchloremia, from NaCL admin and NaHCO3 Hypokalemia   P: Will add free water to TF  Stop all fluid administration  Replace electrolytes as needed  Follow up BMP this afternoon I have paged nephrology to ask them to see him again   Bladder outlet obstruction  -s/p foley by urology P: Flomax, proscar, Keep foley, planned follow up with urology   DM P: SSI on CBGs  Lantus 14U   Disposition / Summary of Today's Plan 07/03/18   Nephrology to see again  Fix electrolytes     Diet: Regular diet if awake Pain/Anxiety/Delirium protocol (if indicated): N/A. VAP protocol (if indicated): N/A. DVT prophylaxis: heparin  GI prophylaxis:  N/A. Hyperglycemia protocol: SSI Mobility: Bedrest. Code Status: full. Family Communication: No family 10/27.  Labs (personally reviewed)  CBC: Recent Labs  Lab 06/27/18 0446 06/28/18 0457 06/29/18 0339 06/30/18 0553 07/01/18 0627  WBC 11.0* 10.1 11.1* 12.3* 11.5*  HGB 12.3* 12.5* 12.5* 12.4* 11.9*  HCT 41.8 41.5 40.2 41.0 39.8  MCV 103.0* 101.7* 97.8 98.3 98.8  PLT 241 PLATELET CLUMPS NOTED ON SMEAR, UNABLE TO ESTIMATE 201 204 932    Basic Metabolic Panel: Recent Labs  Lab 06/27/18 0446 06/28/18 0457 06/29/18 0339 06/30/18 0553 07/01/18 0627 07/02/18 0229 07/03/18 0710 07/03/18 0953  NA 156* 151* 145 143 145 146* 153*  --   K 3.6 4.0 3.0* 3.0* 3.3* 3.8 4.3  --   CL 127* 122* 116* 111 117* 120* 124*  --   CO2 22 21* 21* 21* 19* 18* 19*  --   GLUCOSE 380* 288* 230* 278* 241* 217* 304*  --   BUN 10 15 13 13 16  24* 32*  --   CREATININE 2.08* 1.81* 1.69* 1.67* 2.17* 2.86* 3.15*  --   CALCIUM 10.7* 10.5* 10.5* 10.7* 10.8* 10.9* 10.4*  --   MG 2.2 2.1  --   --  2.5* 2.8*  --  2.9*  PHOS 2.9 2.8 2.4*  2.8  --  3.2  --  4.8*   GFR: Estimated Creatinine Clearance: 41.9 mL/min (A) (by C-G formula based on SCr of 2.86 mg/dL (H)). Recent Labs  Lab 06/28/18 0457 06/29/18 0339 06/30/18 0553 07/01/18 0627 07/01/18 1654 07/02/18 0229  PROCALCITON 0.44 0.59  --   --  0.58 0.63  WBC 10.1 11.1* 12.3* 11.5*  --   --   LATICACIDVEN  --   --   --   --  1.1  --     Liver Function Tests: Recent Labs  Lab 06/27/18 0446 06/28/18 0457 06/29/18 0339 06/30/18 0553 07/01/18 0627  AST  --   --   --   --  28  ALT  --   --   --   --  40  ALKPHOS  --   --   --   --  62  BILITOT  --   --   --   --  0.7  PROT  --   --   --   --  6.2*  ALBUMIN 3.2* 3.1* 3.1* 3.2* 3.0*   No results for input(s): LIPASE, AMYLASE in the last 168 hours. No results for input(s): AMMONIA in the last 168 hours.  ABG    Component Value Date/Time   PHART 7.229 (L) 07/03/2018 0636   PCO2ART 49.6 (H)  07/03/2018 0636   PO2ART 87.0 07/03/2018 0636   HCO3 20.1 07/03/2018 0636   TCO2 21 (L) 07/03/2018 0636   ACIDBASEDEF 7.0 (H) 07/03/2018 0636   O2SAT 92.0 07/03/2018 0636     Coagulation Profile: No results for input(s): INR, PROTIME in the last 168 hours.  Cardiac Enzymes: No results for input(s): CKTOTAL, CKMB, CKMBINDEX, TROPONINI in the last 168 hours.  HbA1C: Hemoglobin A1C  Date/Time Value Ref Range Status  08/25/2015 9.9  Final   Hgb A1c MFr Bld  Date/Time Value Ref Range Status  07/19/2017 08:30 AM 5.5 4.6 - 6.5 % Final    Comment:    Glycemic Control Guidelines for People with Diabetes:Non Diabetic:  <6%Goal of Therapy: <7%Additional Action Suggested:  >8%   04/21/2017 10:13 AM 5.7 4.6 - 6.5 % Final    Comment:    Glycemic Control Guidelines for People with Diabetes:Non Diabetic:  <6%Goal of Therapy: <7%Additional Action Suggested:  >8%     CBG: Recent Labs  Lab 07/02/18 1101 07/02/18 1719 07/02/18 1916 07/02/18 2221 07/03/18 0741  GLUCAP 190* 154* 164* 172* 247*    This patient is critically ill with multiple organ system failure; which, requires frequent high complexity decision making, assessment, support, evaluation, and titration of therapies. This was completed through the application of advanced monitoring technologies and extensive interpretation of multiple databases. During this encounter critical care time was devoted to patient care services described in this note for 45 minutes.    Garner Nash, DO [facility] 07/03/2018 8:13 AM  Personal pager: [phone] If unanswered, please page CCM On-call: [phone]

## 2018-07-03 NOTE — Procedures (Signed)
Intubation Procedure Note Frank Cox [id number] [birth year]  Procedure: Intubation Indications: Respiratory insufficiency  Procedure Details Consent: Unable to obtain consent because of emergent medical necessity. Time Out: Verified patient identification, verified procedure, site/side was marked, verified correct patient position, special equipment/implants available, medications/allergies/relevent history reviewed, required imaging and test results available.  Performed  Drugs:  100 mcg Fentanyl, , 20 mg Etomidate, 50 mg Rocuronium. VL x 1 with # 3 blade. Grade 1 view. 7.5 tube visualized passing through vocal cords. Following intubation:  positive color change on ETCO2, condensation seen in endotracheal tube, equal breath sounds bilaterally.  Evaluation Hemodynamic Status: BP stable throughout; O2 sats: stable throughout Patient's Current Condition: stable Complications: No apparent complications Patient did tolerate procedure well. Chest X-ray ordered to verify placement.  CXR: pending.   Montey Hora, [place] [facility] Pager: [phone]  or [phone] 07/03/2018, 3:33 AM

## 2018-07-03 NOTE — Progress Notes (Addendum)
Pharmacy Antibiotic Note  Frank Cox is a 57 y.o. male admitted on 06/17/2018 with penile drainage.  Pharmacy consulted for Unasyn dosing. Penile drainage growing enterococcus faecalis and staphylococcus lugdunesis. Abx broadened today to Vancomycin, Cefepime, and Flagyl as pt continues with fevers. Tm 103 - requring flexiseal with diarrhea. Cdiff negative. WBC 11.5.  Pt with SCr up to 3.12, est norm CrCl ~25 ml/min. UOP trending down. Renal consulted.  Plan: -Vancomycin 2gm IV now then 1750mg  IV q48h -Cefepime 2gm IV now then 1gm q24h -Flagyl 500mg  IV q8h -Monitor renal function, clinical progress, and vanc trough as needed  Height: 6\' 2"  (188 cm) Weight: (!) 307 lb 12.2 oz (139.6 kg) IBW/kg (Calculated) : 82.2  Temp (24hrs), Avg:100.6 F (38.1 C), Min:98 F (36.7 C), Max:103 F (39.4 C)  Recent Labs  Lab 06/28/18 0457 06/29/18 0339 06/30/18 0553 07/01/18 0627 07/01/18 1654 07/02/18 0229 07/03/18 0710  WBC 10.1 11.1* 12.3* 11.5*  --   --  10.0  CREATININE 1.81* 1.69* 1.67* 2.17*  --  2.86* 3.15*  LATICACIDVEN  --   --   --   --  1.1  --   --     Estimated Creatinine Clearance: 38 mL/min (A) (by C-G formula based on SCr of 3.15 mg/dL (H)).    No Known Allergies  Antimicrobials this admission: Flagyl 10/28>> Cefepime 10/28>> Vanc 10/28>> Zosyn 10/20 >>10/26 Unasyn 10/26>>  Po vanc 10/28 x1  Microbiogy: 10/18 MRSA PCR >> negative 10/20 UCx >>negative 10/20 BCx >>negative 10/22 penile drainage >> staph lugdunensis/ E.faec (pan S)  10/23 UCx>>negative 10/23 BCx>>ngtd 10/28 UCx>> 10/28 Trach asp>> 10/28 Cdiff PCR>>negative   Thank you for allowing pharmacy to be a part of this patient's care.  Sherlon Handing, PharmD, BCPS Clinical pharmacist  **Pharmacist phone directory can now be found on [email or web address] ([email or web address]).  Listed under [place]. 07/03/2018 2:50 PM

## 2018-07-03 NOTE — Progress Notes (Signed)
Patient's BP trending down with systolic as low as 56/86 (50). Fentanyl gtt stopped and MD paged. 500cc bolus of LR ordered and given. BP is now 109/69 (82). Pt is extremely restless and agitated at this time. PRN Versed has not helped. He is at his max of Precedex. Jayland Null, Rande Brunt, RN

## 2018-07-03 NOTE — Progress Notes (Signed)
PT Cancellation Note  Patient Details Name: Frank Cox MRN: [id number] DOB: 10-12-1960   Cancelled Treatment:    Reason Eval/Treat Not Completed: Patient not medically ready; patient intubated overnight and sedated.  Will sign off and await new order when stable for PT.    Reginia Naas 07/03/2018, 9:50 AM  Magda Kiel, [place] [phone] 07/03/2018

## 2018-07-03 NOTE — Progress Notes (Signed)
Initial Nutrition Assessment  DOCUMENTATION CODES:   Obesity unspecified  INTERVENTION:   Initiate:  Vital High Protein @ 55 ml/hr (1320 ml/day) via OG tube 60 ml Prostat BID  Provides: 1720 kcal, 175 grams protein, and 1103 ml free water.    NUTRITION DIAGNOSIS:   Increased nutrient needs related to acute illness(sepsis) as evidenced by estimated needs.  GOAL:   Provide needs based on ASPEN/SCCM guidelines  MONITOR:   TF tolerance, Skin  REASON FOR ASSESSMENT:   Consult, Ventilator Enteral/tube feeding initiation and management  ASSESSMENT:   Pt with PMH of bipolar affective disorder, DM 2, HTN, Prostate ca, OSA on CPAP admitted 10/18 with AKI requiring urgent HD for acute on chronic lithium toxicity. Pt with increasing agitation tx to ICU and intubated 10/28.    Pt with fevers, increased liquid stools and had flexiseal placed, cdiff pending. Pt being worked up for sepsis.   Pt discussed during ICU rounds and with RN.  Patient is currently intubated on ventilator support Temp (24hrs), Avg:100.6 F (38.1 C), Min:98 F (36.7 C), Max:103 F (39.4 C)  Medications reviewed and include: folic acid, SSI TID with meals and HS, lantus 14 units BID, MVI, thiamine  Free water: 200 ml every 8 hours Labs reviewed: PO4: 4.8 (H), magnesium 2.9 (H), 153 (H) BP: 84/60 MAP: 96   UOP: 2275 ml x 24 hrs OG tube, tip likely distal stomach   NUTRITION - FOCUSED PHYSICAL EXAM:    Most Recent Value  Orbital Region  No depletion  Upper Arm Region  No depletion  Thoracic and Lumbar Region  No depletion  Buccal Region  Unable to assess  Temple Region  No depletion  Clavicle Bone Region  No depletion  Clavicle and Acromion Bone Region  No depletion  Scapular Bone Region  Unable to assess  Dorsal Hand  No depletion  Patellar Region  No depletion  Anterior Thigh Region  No depletion  Posterior Calf Region  No depletion  Edema (RD Assessment)  Mild  Hair  Reviewed  Eyes  Unable  to assess  Mouth  Unable to assess  Skin  Reviewed  Nails  Reviewed       Diet Order:   Diet Order            Diet NPO time specified  Diet effective now              EDUCATION NEEDS:   No education needs have been identified at this time  Skin:  Skin Assessment: (MASD: buttocks and groin)  Last BM:  700 ml x 24 hr as well as 5 unmeasured  Height:   Ht Readings from Last 1 Encounters:  07/03/18 6\' 2"  (1.88 m)    Weight:   Wt Readings from Last 1 Encounters:  06/29/18 (!) 139.6 kg    Ideal Body Weight:  86.3 kg  BMI:  Body mass index is 39.51 kg/m.  Estimated Nutritional Needs:   Kcal:  6256-3893  Protein:  >/= 172 grams  Fluid:  > 2 L/day  Maylon Peppers RD, LDN, CNSC [phone] Pager [phone] After Hours Pager

## 2018-07-03 NOTE — Progress Notes (Signed)
[facility] MD made aware of hypotension and total IV fluid rate of >579mls hour. See new orders.

## 2018-07-03 NOTE — Progress Notes (Signed)
PCCM Interval Progress Note  Called to bedside for hypoxia and increased WOB after turning pt for cleaning.  Less responsive per RN.  Diaphoretic.  Increased WOB.  SpO2 low 90's on 100% NRB.  Decision made to intubate.   Montey Hora, [place] [facility] Pager: [phone]  or [phone] 07/03/2018, 3:36 AM

## 2018-07-04 ENCOUNTER — Inpatient Hospital Stay (HOSPITAL_COMMUNITY): Payer: 59

## 2018-07-04 DIAGNOSIS — B3749 Other urogenital candidiasis: Secondary | ICD-10-CM

## 2018-07-04 DIAGNOSIS — Z9911 Dependence on respirator [ventilator] status: Secondary | ICD-10-CM

## 2018-07-04 DIAGNOSIS — R6521 Severe sepsis with septic shock: Secondary | ICD-10-CM

## 2018-07-04 DIAGNOSIS — R369 Urethral discharge, unspecified: Secondary | ICD-10-CM

## 2018-07-04 DIAGNOSIS — R579 Shock, unspecified: Secondary | ICD-10-CM

## 2018-07-04 DIAGNOSIS — Z960 Presence of urogenital implants: Secondary | ICD-10-CM

## 2018-07-04 DIAGNOSIS — Q826 Congenital sacral dimple: Secondary | ICD-10-CM

## 2018-07-04 DIAGNOSIS — T43595D Adverse effect of other antipsychotics and neuroleptics, subsequent encounter: Secondary | ICD-10-CM

## 2018-07-04 DIAGNOSIS — A419 Sepsis, unspecified organism: Secondary | ICD-10-CM

## 2018-07-04 DIAGNOSIS — R509 Fever, unspecified: Secondary | ICD-10-CM

## 2018-07-04 LAB — CBC WITH DIFFERENTIAL/PLATELET
Abs Immature Granulocytes: 0.07 10*3/uL (ref 0.00–0.07)
BASOS PCT: 0 %
Basophils Absolute: 0 10*3/uL (ref 0.0–0.1)
Eosinophils Absolute: 0 10*3/uL (ref 0.0–0.5)
Eosinophils Relative: 0 %
HCT: 37.3 % — ABNORMAL LOW (ref 39.0–52.0)
HEMOGLOBIN: 10.9 g/dL — AB (ref 13.0–17.0)
Immature Granulocytes: 1 %
LYMPHS PCT: 7 %
Lymphs Abs: 0.9 10*3/uL (ref 0.7–4.0)
MCH: 30.6 pg (ref 26.0–34.0)
MCHC: 29.2 g/dL — ABNORMAL LOW (ref 30.0–36.0)
MCV: 104.8 fL — AB (ref 80.0–100.0)
MONO ABS: 1 10*3/uL (ref 0.1–1.0)
MONOS PCT: 8 %
NEUTROS ABS: 10.5 10*3/uL — AB (ref 1.7–7.7)
Neutrophils Relative %: 84 %
Platelets: 77 10*3/uL — ABNORMAL LOW (ref 150–400)
RBC: 3.56 MIL/uL — ABNORMAL LOW (ref 4.22–5.81)
RDW: 14.3 % (ref 11.5–15.5)
WBC: 12.5 10*3/uL — ABNORMAL HIGH (ref 4.0–10.5)
nRBC: 0 % (ref 0.0–0.2)

## 2018-07-04 LAB — NA AND K (SODIUM & POTASSIUM), RAND UR
Potassium Urine: 21 mmol/L
SODIUM UR: 38 mmol/L

## 2018-07-04 LAB — HEPATIC FUNCTION PANEL
ALK PHOS: 48 U/L (ref 38–126)
ALT: 24 U/L (ref 0–44)
AST: 22 U/L (ref 15–41)
Albumin: 2.7 g/dL — ABNORMAL LOW (ref 3.5–5.0)
BILIRUBIN INDIRECT: 0.5 mg/dL (ref 0.3–0.9)
Bilirubin, Direct: 0.1 mg/dL (ref 0.0–0.2)
TOTAL PROTEIN: 6.2 g/dL — AB (ref 6.5–8.1)
Total Bilirubin: 0.6 mg/dL (ref 0.3–1.2)

## 2018-07-04 LAB — BASIC METABOLIC PANEL
Anion gap: 6 (ref 5–15)
BUN: 45 mg/dL — ABNORMAL HIGH (ref 6–20)
CHLORIDE: 123 mmol/L — AB (ref 98–111)
CO2: 19 mmol/L — ABNORMAL LOW (ref 22–32)
Calcium: 10 mg/dL (ref 8.9–10.3)
Creatinine, Ser: 3.22 mg/dL — ABNORMAL HIGH (ref 0.61–1.24)
GFR calc Af Amer: 23 mL/min — ABNORMAL LOW (ref >60)
GFR calc non Af Amer: 20 mL/min — ABNORMAL LOW (ref >60)
GLUCOSE: 392 mg/dL — AB (ref 70–99)
POTASSIUM: 3.5 mmol/L (ref 3.5–5.1)
SODIUM: 148 mmol/L — AB (ref 135–145)

## 2018-07-04 LAB — PHOSPHORUS: PHOSPHORUS: 4.1 mg/dL (ref 2.5–4.6)

## 2018-07-04 LAB — GLUCOSE, CAPILLARY
GLUCOSE-CAPILLARY: 378 mg/dL — AB (ref 70–99)
GLUCOSE-CAPILLARY: 257 mg/dL — AB (ref 70–99)
Glucose-Capillary: 360 mg/dL — ABNORMAL HIGH (ref 70–99)
Glucose-Capillary: 349 mg/dL — ABNORMAL HIGH (ref 70–99)
Glucose-Capillary: 382 mg/dL — ABNORMAL HIGH (ref 70–99)
Glucose-Capillary: 207 mg/dL — ABNORMAL HIGH (ref 70–99)
Glucose-Capillary: 249 mg/dL — ABNORMAL HIGH (ref 70–99)

## 2018-07-04 LAB — URINE CULTURE: CULTURE: NO GROWTH

## 2018-07-04 LAB — MAGNESIUM: Magnesium: 2.6 mg/dL — ABNORMAL HIGH (ref 1.7–2.4)

## 2018-07-04 MED ORDER — CLONAZEPAM 0.125 MG PO TBDP
0.2500 mg | ORAL_TABLET | Freq: Three times a day (TID) | ORAL | Status: DC
  Administered 2018-07-04 – 2018-07-09 (×17): 0.25 mg
  Filled 2018-07-04 (×17): qty 2

## 2018-07-04 MED ORDER — INSULIN GLARGINE 100 UNIT/ML ~~LOC~~ SOLN
18.0000 [IU] | Freq: Two times a day (BID) | SUBCUTANEOUS | Status: DC
  Administered 2018-07-04 – 2018-07-06 (×4): 18 [IU] via SUBCUTANEOUS
  Filled 2018-07-04 (×4): qty 0.18

## 2018-07-04 MED ORDER — SODIUM CHLORIDE 0.9 % IV BOLUS
500.0000 mL | Freq: Once | INTRAVENOUS | Status: AC
  Administered 2018-07-04: 500 mL via INTRAVENOUS

## 2018-07-04 MED ORDER — FAMOTIDINE 40 MG/5ML PO SUSR
20.0000 mg | Freq: Every day | ORAL | Status: DC
  Administered 2018-07-04: 20 mg
  Filled 2018-07-04: qty 2.5

## 2018-07-04 MED ORDER — INSULIN ASPART 100 UNIT/ML ~~LOC~~ SOLN
6.0000 [IU] | SUBCUTANEOUS | Status: DC
  Administered 2018-07-04 – 2018-07-06 (×12): 6 [IU] via SUBCUTANEOUS

## 2018-07-04 MED ORDER — FREE WATER
200.0000 mL | Freq: Four times a day (QID) | Status: DC
  Administered 2018-07-04 – 2018-07-05 (×4): 200 mL

## 2018-07-04 MED ORDER — FLUCONAZOLE IN SODIUM CHLORIDE 200-0.9 MG/100ML-% IV SOLN
200.0000 mg | INTRAVENOUS | Status: DC
  Administered 2018-07-04 – 2018-07-07 (×4): 200 mg via INTRAVENOUS
  Filled 2018-07-04 (×5): qty 100

## 2018-07-04 MED ORDER — PHENYLEPHRINE HCL-NACL 40-0.9 MG/250ML-% IV SOLN
0.0000 ug/min | INTRAVENOUS | Status: DC
  Administered 2018-07-04: 100 ug/min via INTRAVENOUS
  Administered 2018-07-04: 400 ug/min via INTRAVENOUS
  Administered 2018-07-04: 10 ug/min via INTRAVENOUS
  Administered 2018-07-05: 250 ug/min via INTRAVENOUS
  Administered 2018-07-05: 100 ug/min via INTRAVENOUS
  Filled 2018-07-04 (×6): qty 250

## 2018-07-04 MED ORDER — NOREPINEPHRINE 4 MG/250ML-% IV SOLN
0.0000 ug/min | INTRAVENOUS | Status: DC
  Administered 2018-07-05: 6 ug/min via INTRAVENOUS
  Administered 2018-07-05: 2 ug/min via INTRAVENOUS
  Administered 2018-07-05: 20 ug/min via INTRAVENOUS
  Administered 2018-07-06: 12 ug/min via INTRAVENOUS
  Administered 2018-07-06: 15 ug/min via INTRAVENOUS
  Administered 2018-07-06: 18 ug/min via INTRAVENOUS
  Administered 2018-07-06 – 2018-07-07 (×2): 16 ug/min via INTRAVENOUS
  Administered 2018-07-07 (×2): 5 ug/min via INTRAVENOUS
  Administered 2018-07-07: 16 ug/min via INTRAVENOUS
  Administered 2018-07-08: 6 ug/min via INTRAVENOUS
  Administered 2018-07-08: 15.013 ug/min via INTRAVENOUS
  Administered 2018-07-08: 17 ug/min via INTRAVENOUS
  Administered 2018-07-08: 10 ug/min via INTRAVENOUS
  Administered 2018-07-09: 6 ug/min via INTRAVENOUS
  Filled 2018-07-04 (×16): qty 250

## 2018-07-04 MED ORDER — STERILE WATER FOR INJECTION IV SOLN: INTRAVENOUS | Status: DC

## 2018-07-04 MED ORDER — CLONAZEPAM 0.5 MG PO TABS
0.2500 mg | ORAL_TABLET | Freq: Three times a day (TID) | ORAL | Status: DC
  Administered 2018-07-04: 0.25 mg
  Filled 2018-07-04: qty 1

## 2018-07-04 MED ORDER — FENTANYL 2500MCG IN NS 250ML (10MCG/ML) PREMIX INFUSION: 0.0000 ug/h | INTRAVENOUS | Status: DC

## 2018-07-04 NOTE — Procedures (Signed)
Arterial Catheter Insertion Procedure Note GRADYN SHEIN [id number] March 26, 1961  Procedure: Insertion of Arterial Catheter  Indications: Blood pressure monitoring. On 23mcg of phenylephrine peripherally and BPs being taken on thigh with extra large cuff.   Procedure Details Consent: Unable to obtain consent because of altered level of consciousness. Emergent medical necessity. No family phone numbers listed in EMR. Attempted to call his home number (he supposedly lives wit his father), but there was no answer.  Time Out: Verified patient identification, verified procedure, site/side was marked, verified correct patient position, special equipment/implants available, medications/allergies/relevent history reviewed, required imaging and test results available.  Performed  Maximum sterile technique was used including antiseptics, cap, gloves, gown, hand hygiene, mask and sheet. Skin prep: Chlorhexidine; local anesthetic administered 20 gauge catheter was inserted into left radial artery using the Seldinger technique. ULTRASOUND GUIDANCE USED: YES Evaluation Blood flow good; BP tracing good. Complications: No apparent complications.   Georgann Housekeeper, AGACNP-BC [place] Pager [phone] or [phone]  07/04/2018 12:07 AM

## 2018-07-04 NOTE — Progress Notes (Signed)
Bedside EEG completed; results pending. 

## 2018-07-04 NOTE — Progress Notes (Signed)
Patient transported to CT and back to room 4N15 without any apparent complications.

## 2018-07-04 NOTE — Procedures (Signed)
History: 57 year old male with lithium toxicity and confusion  Sedation: Fentanyl  Technique: This is a 21 channel routine scalp EEG performed at the bedside with bipolar and monopolar montages arranged in accordance to the international 10/20 system of electrode placement. One channel was dedicated to EKG recording.    Background: The background consists predominantly of generalized irregular delta and theta activities.  There are occasional sleep structures seen which are symmetric in distribution.  There was no PR seen.  Photic stimulation: Physiologic driving is not performed  EEG Abnormalities: 1) generalized irregular slow activity  Clinical Interpretation: This EEG is consistent with a generalized nonspecific cerebral dysfunction (encephalopathy) which is nonspecific.  There was no seizure or seizure predisposition recorded on this study. Please note that a normal EEG does not preclude the possibility of epilepsy.   Roland Rack, MD [facility] [phone]  If 7pm- 7am, please page neurology on call as listed in [place].

## 2018-07-04 NOTE — Progress Notes (Signed)
PCCM:  CT abdomen, pelvis, no clear source of infection or fevers.  EEG results pending  I have asked ID to see the patient.  I appreciate their thoughts and recommendations.   Garner Nash, DO [place] [facility] 07/04/2018 2:52 PM  Personal pager: [phone] If unanswered, please page CCM On-call: [phone]

## 2018-07-04 NOTE — Procedures (Signed)
Central Venous Catheter Insertion Procedure Note Frank Cox [id number] April 07, 1961  Procedure: Insertion of Central Venous Catheter Indications: Assessment of intravascular volume, Drug and/or fluid administration and Frequent blood sampling  Procedure Details Consent: Unable to obtain consent because of altered level of consciousness. Emergent medical necessity. No family phone numbers listed in EMR. Attempted to call his home number (he supposedly lives wit his father), but there was no answer.  Time Out: Verified patient identification, verified procedure, site/side was marked, verified correct patient position, special equipment/implants available, medications/allergies/relevent history reviewed, required imaging and test results available.  Performed  Maximum sterile technique was used including antiseptics, cap, gloves, gown, hand hygiene, mask and sheet. Skin prep: Chlorhexidine; local anesthetic administered A antimicrobial bonded/coated triple lumen catheter was placed in the left internal jugular vein using the Seldinger technique. Ultrasound guidance used.Yes.   Catheter placed to 20 cm. Blood aspirated via all 3 ports and then flushed x 3. Line sutured x 2 and dressing applied.  Evaluation Blood flow good Complications: No apparent complications Patient did tolerate procedure well. Chest X-ray ordered to verify placement.  CXR: normal.   Georgann Housekeeper, AGACNP-BC [place] Pager [phone] or [phone]  07/04/2018 12:09 AM

## 2018-07-04 NOTE — Consult Note (Signed)
[facility]       Reason for Consult: fever    Referring Physician: Dr. Valeta Harms  Principal Problem:   Lithium toxicity Active Problems:   Lapband APL Jan 2011   OSA (obstructive sleep apnea)   Malignant neoplasm of prostate ([place])   Bipolar 1 disorder ([place])   Morbid obesity ([place])   Benign essential HTN   Acute respiratory failure with hypoxia ([place])   Acute respiratory distress   Shock ([place])   . aspirin  81 mg Per Tube Daily  . chlorhexidine gluconate (MEDLINE KIT)  15 mL Mouth Rinse BID  . citalopram  40 mg Per Tube Daily  . clonazepam  0.25 mg Per Tube TID  . famotidine  20 mg Per Tube Daily  . feeding supplement (PRO-STAT SUGAR FREE 64)  60 mL Per Tube BID  . finasteride  5 mg Oral Daily  . folic acid  1 mg Intravenous Daily  . free water  200 mL Per Tube Q6H  . heparin  5,000 Units Subcutaneous Q8H  . insulin aspart  0-20 Units Subcutaneous Q4H  . insulin aspart  6 Units Subcutaneous Q4H  . insulin glargine  18 Units Subcutaneous BID  . lamoTRIgine  400 mg Per Tube QHS  . mouth rinse  15 mL Mouth Rinse 10 times per day  . multivitamin  15 mL Per Tube Daily  . nystatin   Topical BID  . QUEtiapine  25 mg Per Tube QHS  . tamsulosin  0.8 mg Oral Daily  . thiamine injection  100 mg Intravenous Q24H    Recommendations: Continue with cefepime, vancomycin and flagyl  Fluconazole for skin, penile discharge  Assessment: High fever without obvious source.  Doubt penile area which is superficial and likely related to Candida like his groin folds.  His skin does not appear to have a superinfection.  Could still be a result of lithium toxicity.  Doubt serotonin syndrome as he has been getting his celexa.    Dr. Tommy Medal will see tomorrow, I will be back Thursday  Antibiotics: Cefepimen, vancomycin, metronidazole Previous cetriaxone, piptazo, unasyn  HPI: Frank Cox is a 57 y.o. male with history of bipolar disorder, admission for lithium  toxicity requiring emergent HD earlier this month who has had a fever up to 103 for 3 days.  He previously had a similar high fever on 10/20 after transfer out of ICU and transferred back to step down then.  He was placed on piptazo and changed to ceftriaxone for presumed UTI, empiric cultures sent but nothing positive.  Fever declined but returned again on 10/27.  The WBC has remained minimally elevated.  Placed back on Unasyn then broadened yesterday with high fever.     Review of Systems: Unobtainable due to patient factors   Past Medical History:  Diagnosis Date  . Anxiety   . Arthritis   . Benign essential HTN 10/01/2016  . Bipolar 1 disorder ([place])    "well controlled on Lithium"  . Bruises easily   . Contact lens/glasses fitting   . Depression   . Diabetes mellitus   . Fatigue   . Gallstones   . Generalized headaches   . Hearing loss   . Hyperlipidemia   . Hypertension    boarderline - elevated  . Kidney stone   . Obesity   . OSA on CPAP   . Poor circulation   . Prostate cancer ([place]) 02/14/14   Gleason 4+3=7, volume 56  mL  . S/P radiation therapy 07/03/2014 through 08/28/2014                                                       Prostate 7800 cGy in 40 sessions, seminal vesicles 5600 cGy in 40 sessions                         From chart review, not obtainable from the patient  Social History   Tobacco Use  . Smoking status: Never Smoker  . Smokeless tobacco: Never Used  Substance Use Topics  . Alcohol use: No  . Drug use: No   From chart review, not obtainable from the patient  Family History  Problem Relation Age of Onset  . Arthritis Mother   . Alcohol abuse Mother   . Arthritis Father    From chart review, not obtainable from the patient  No Known Allergies From chart review, not obtainable from the patient  Physical Exam: Constitutional: NAD   Vitals:   07/04/18 1400 07/04/18 1516  BP:    Pulse: (!) 125   Resp: 17   Temp:    SpO2: 95% 94%    EYES: anicteric ENMT: +ET Cardiovascular: Cor RRR Respiratory: breathing assisted on vent; coarse breath sounds anteriorly GI: Bowel sounds are normal, liver is not enlarged, spleen is not enlarged Musculoskeletal: no edema Skin: right groin with Candidal rash, left leg with chronic vascular changes Neuro: sedated, unresponsive GU: superficial penile thick discharge; foley in place Back: no open ulcers or skin breakdown; superior congenital divot over rectal fold   Lab Results  Component Value Date   WBC 12.5 (H) 07/04/2018   HGB 10.9 (L) 07/04/2018   HCT 37.3 (L) 07/04/2018   MCV 104.8 (H) 07/04/2018   PLT 77 (L) 07/04/2018    Lab Results  Component Value Date   CREATININE 3.22 (H) 07/04/2018   BUN 45 (H) 07/04/2018   NA 148 (H) 07/04/2018   K 3.5 07/04/2018   CL 123 (H) 07/04/2018   CO2 19 (L) 07/04/2018    Lab Results  Component Value Date   ALT 24 07/04/2018   AST 22 07/04/2018   ALKPHOS 48 07/04/2018     Microbiology: Recent Results (from the past 240 hour(s))  Culture, blood (routine x 2)     Status: None   Collection Time: 06/25/18 11:39 AM  Result Value Ref Range Status   Specimen Description BLOOD LEFT ARM  Final   Special Requests   Final    BOTTLES DRAWN AEROBIC ONLY Blood Culture adequate volume   Culture   Final    NO GROWTH 5 DAYS Performed at [facility], [address] [place], [place] [address]    Report Status 06/30/2018 FINAL  Final  Culture, blood (routine x 2)     Status: None   Collection Time: 06/25/18 11:45 AM  Result Value Ref Range Status   Specimen Description BLOOD RIGHT HAND  Final   Special Requests   Final    BOTTLES DRAWN AEROBIC ONLY Blood Culture adequate volume   Culture   Final    NO GROWTH 5 DAYS Performed at [facility], [place] [address], [place], [place] [address]    Report Status 06/30/2018 FINAL  Final  Aerobic/Anaerobic Culture (surgical/deep  wound)     Status: None   Collection Time: 06/27/18  9:30  AM  Result Value Ref Range Status   Specimen Description PENILE DRAINAGE  Final   Special Requests Normal  Final   Gram Stain   Final    RARE WBC PRESENT,BOTH PMN AND MONONUCLEAR ABUNDANT SQUAMOUS EPITHELIAL CELLS PRESENT FEW GRAM POSITIVE COCCI RARE GRAM POSITIVE RODS    Culture   Final    ABUNDANT STAPHYLOCOCCUS LUGDUNENSIS MODERATE ENTEROCOCCUS FAECALIS NO ANAEROBES ISOLATED Performed at [facility], [place] [address], [place], [place] [address]    Report Status 07/02/2018 FINAL  Final   Organism ID, Bacteria STAPHYLOCOCCUS LUGDUNENSIS  Final   Organism ID, Bacteria ENTEROCOCCUS FAECALIS  Final      Susceptibility   Enterococcus faecalis - MIC*    AMPICILLIN <=2 SENSITIVE Sensitive     VANCOMYCIN 2 SENSITIVE Sensitive     GENTAMICIN SYNERGY SENSITIVE Sensitive     * MODERATE ENTEROCOCCUS FAECALIS   Staphylococcus lugdunensis - MIC*    CIPROFLOXACIN <=0.5 SENSITIVE Sensitive     ERYTHROMYCIN <=0.25 SENSITIVE Sensitive     GENTAMICIN <=0.5 SENSITIVE Sensitive     OXACILLIN 1 SENSITIVE Sensitive     TETRACYCLINE <=1 SENSITIVE Sensitive     VANCOMYCIN <=0.5 SENSITIVE Sensitive     TRIMETH/SULFA <=10 SENSITIVE Sensitive     CLINDAMYCIN <=0.25 SENSITIVE Sensitive     RIFAMPIN <=0.5 SENSITIVE Sensitive     Inducible Clindamycin NEGATIVE Sensitive     * ABUNDANT STAPHYLOCOCCUS LUGDUNENSIS  Culture, blood (routine x 2)     Status: None   Collection Time: 06/28/18 11:34 AM  Result Value Ref Range Status   Specimen Description BLOOD LEFT HAND  Final   Special Requests   Final    BOTTLES DRAWN AEROBIC ONLY Blood Culture adequate volume   Culture   Final    NO GROWTH 5 DAYS Performed at [facility], [place] [address], [place], [place] [address]    Report Status 07/03/2018 FINAL  Final  Culture, Urine     Status: None   Collection Time: 06/28/18 12:13 PM  Result Value Ref Range Status   Specimen Description URINE, CATHETERIZED  Final   Special Requests NONE  Final    Culture   Final    NO GROWTH Performed at [facility], [address], [place], [place] [address]    Report Status 06/29/2018 FINAL  Final  Culture, blood (routine x 2)     Status: None   Collection Time: 06/28/18 12:30 PM  Result Value Ref Range Status   Specimen Description BLOOD RIGHT HAND  Final   Special Requests   Final    BOTTLES DRAWN AEROBIC ONLY Blood Culture adequate volume   Culture   Final    NO GROWTH 5 DAYS Performed at [facility], [place] [address] [place], [place] [address]    Report Status 07/03/2018 FINAL  Final  Culture, respiratory (non-expectorated)     Status: None (Preliminary result)   Collection Time: 07/03/18  8:29 AM  Result Value Ref Range Status   Specimen Description TRACHEAL ASPIRATE  Final   Special Requests NONE  Final   Gram Stain   Final    RARE WBC PRESENT,BOTH PMN AND MONONUCLEAR NO ORGANISMS SEEN    Culture   Final    NO GROWTH 1 DAY Performed at [facility], [place] [address], [place],  [address]    Report Status PENDING  Incomplete  Culture, Urine     Status: None   Collection Time: 07/03/18  9:06 AM  Result Value Ref Range Status   Specimen Description URINE, RANDOM  Final   Special Requests NONE  Final   Culture   Final    NO GROWTH Performed at [facility], [address], [place], [place] [address]    Report Status 07/04/2018 FINAL  Final  C difficile quick scan w PCR reflex     Status: None   Collection Time: 07/03/18  9:49 AM  Result Value Ref Range Status   C Diff antigen NEGATIVE NEGATIVE Final   C Diff toxin NEGATIVE NEGATIVE Final   C Diff interpretation No C. difficile detected.  Final    Comment: Performed at [facility], [place] [address], [place], [place] [address]  Culture, blood (routine x 2)     Status: None (Preliminary result)   Collection Time: 07/03/18  9:53 AM  Result Value Ref Range Status   Specimen Description BLOOD BLOOD RIGHT HAND  Final   Special Requests   Final      BOTTLES DRAWN AEROBIC AND ANAEROBIC Blood Culture results may not be optimal due to an inadequate volume of blood received in culture bottles   Culture   Final    NO GROWTH 1 DAY Performed at [facility], [place] [address], [place], [place] [address]    Report Status PENDING  Incomplete  Culture, blood (routine x 2)     Status: None (Preliminary result)   Collection Time: 07/03/18  9:53 AM  Result Value Ref Range Status   Specimen Description BLOOD BLOOD LEFT HAND  Final   Special Requests   Final    BOTTLES DRAWN AEROBIC AND ANAEROBIC Blood Culture results may not be optimal due to an inadequate volume of blood received in culture bottles   Culture   Final    NO GROWTH 1 DAY Performed at [facility], [place] [address] [place], [place] [address]    Report Status PENDING  Incomplete    Thayer Headings, [facility] [place] [email or web address] [phone] pager  [phone] cell 07/04/2018, 3:40 PM

## 2018-07-04 NOTE — Progress Notes (Signed)
Inpatient Diabetes Program Recommendations  AACE/ADA: New Consensus Statement on Inpatient Glycemic Control (2015)  Target Ranges:  Prepandial:   less than 140 mg/dL      Peak postprandial:   less than 180 mg/dL (1-2 hours)      Critically ill patients:  140 - 180 mg/dL   Lab Results  Component Value Date   GLUCAP 378 (H) 07/04/2018   HGBA1C 5.5 07/19/2017    Review of Glycemic Control Results for Frank Cox, Frank Cox (MRN [id number]) as of 07/04/2018 11:55  Ref. Range 07/04/2018 03:19 07/04/2018 07:41 07/04/2018 11:19  Glucose-Capillary Latest Ref Range: 70 - 99 mg/dL 349 (H) 382 (H) 378 (H)    Diabetes history: Type 2 DM  Outpatient Diabetes medications: Amaryl 8 mg daily, Actos 15 mg daily, Trulicity 1.5 mg weekly, Invokamet XR 50-1000 mg daily Current orders for Inpatient glycemic control:  Novolog resistant q 4 hours, Lantus 14 units BID  Inpatient Diabetes Program Recommendations:    Noted initiation of D5 on 10/28 and sepsis, thus explaining increased insulin needs.  Consider:  -Adding tube feed coverage: Novolog 6 units Q4H. -Increase Lantus to 18-20 units BID.  Thanks, Bronson Curb, MSN, RNC-OB Diabetes Coordinator [phone] (8a-5p)

## 2018-07-04 NOTE — Progress Notes (Addendum)
eLink Physician-Brief Progress Note Patient Name: [name] DOB: 09/08/1960 MRN: [id number]   Date of Service  07/04/2018  HPI/Events of Note  Blood glucose = 295 --> 360 --> 349. Currently on D5W at 100 mL/hour for hypernatremia. Na+ = 146 --> 153. Already on Lantus + Q 4 hour resistant Novolog SSI.  eICU Interventions  Will order: 1. Decrease D5W to 50 mL/hour.  2. Trend Na+.     Intervention Category Major Interventions: Hyperglycemia - active titration of insulin therapy  Sommer,Steven Eugene 07/04/2018, 3:41 AM

## 2018-07-04 NOTE — Progress Notes (Addendum)
[facility] Progress Note    Assessment/ Plan:   Pt is a57 y.o.yo malewith bipolar on lithium who was admitted on10/18/2019with decreased MS- lithium level 2.37 and crt of 1.75    1. Renal- HD times one on 10/18 for lithium toxicity, level normalized. Then discovered to have BOO s/p foley -then polyuria and hypernatremia either due to post obs diuresis or nephrogenic DI from lithium (when pt lucid he did report longstanding thirst so think is neph DI)- UOP slowed down onamiloride for nephrogenic DI and follow.  - last dose of amiloride 10/28  - Urine microscopy shows a few pigmented casts which suggest ATN but not florid; hopefully he is still early in the process and it can be reversed with hydration.  2. HTN/vol- Actually BP is now on low side. 3. Anemia- not an issue 4. ID-Cultures and rocephin per primary - penile drainage enterococcus faecalis and staph lugdunensis on cefepime, flagyl, Unasyn and Vancomycin (doesn't appear to have been given yet) 5. Hypernatremia - either due to BOO and post obstr diuresis (less likely ) vs DI from lithium (more likely)- Had been improving and now worse again. - Will check free water clearance in the urine (nr na and k requested) - Free water deficit of 4L but that does not include any ongoing losses. - Did not tolerate D5W at high rates. - Change the free water via OGT to 228m q6hr as tolerated - d5W @ 50 ml/hr - d/w pharmacy and not able to use 1/4 ns bec of risk of hemolysis. 6. Hypokalemia- repleting (3.5) 7. Psych- behaviors requiring transfer to ICU and IV sedation   Subjective:   Sedated on vent   Objective:   BP (!) 108/56   Pulse (!) 129   Temp (!) 102.8 F (39.3 C) (Axillary) Comment: PRN tylenol given  Resp (!) 30   Ht _0  (1.88 m)   Wt (!) 139.2 kg   SpO2 90%   BMI 39.40 kg/m   Intake/Output Summary (Last 24 hours) at 07/04/2018 1042 Last data filed at 07/04/2018 0930 Gross per 24 hour   Intake 5626.45 ml  Output 2595 ml  Net 3031.45 ml   Weight change:   Physical Exam: General:resting, restraints Heart: RRR Lungs: clear Abdomen: soft, non tender Extremities: no edema Dialysis Access: had right subclavian temp cath - now removed  Imaging: Portable Chest Xray  Result Date: 07/04/2018 CLINICAL DATA:  Respiratory failure, intubated patient, morbid obesity. EXAM: PORTABLE CHEST 1 VIEW COMPARISON:  Portable chest x-ray of July 04, 2018 at 12 midnight FINDINGS: The lungs are mildly hypoinflated. The interstitial markings remain increased. There is fluid in the minor fissure. The cardiac silhouette is enlarged and the pulmonary vascularity engorged. There is no large pleural effusion. The endotracheal tube tip projects 4.4 cm above the carina. The esophagogastric tube tip and proximal port project below the inferior margin of the image. The left internal jugular venous catheter tip projects over the proximal SVC. IMPRESSION: CHF with mild interstitial edema. Bibasilar atelectasis. Mild hypoinflation. These findings are stable. The support tubes are in reasonable position. Electronically Signed   By: David  JMartiniqueM.D.   On: 07/04/2018 09:20   Dg Chest Port 1 View  Result Date: 07/04/2018 CLINICAL DATA:  57 year old male with central line placement. EXAM: PORTABLE CHEST 1 VIEW COMPARISON:  Chest radiograph dated 07/03/2018 FINDINGS: Interval placement of a left IJ central line with tip over central SVC. No pneumothorax. Endotracheal tube above the carina in similar position  and enteric tube extending below the diaphragm with tip beyond the inferior margin of the image. Cardiomegaly with mild vascular congestion. Shallow inspiration with bibasilar atelectasis/scarring. No pleural effusion. No acute osseous pathology. IMPRESSION: Interval placement of a left IJ central line with tip over central SVC. No pneumothorax. Electronically Signed   By: Anner Crete M.D.   On:  07/04/2018 01:35   Dg Chest Port 1 View  Result Date: 07/03/2018 CLINICAL DATA:  57 year old male with enteric tube placement. EXAM: PORTABLE ABDOMEN - 1 VIEW; PORTABLE CHEST - 1 VIEW COMPARISON:  Chest radiograph dated 07/02/2018 FINDINGS: Endotracheal tube with tip approximately 5 cm above the carina. An enteric tube extends below the diaphragm making a single turn in the left upper abdomen with tip extending inferiorly beyond the inferior margin of the image, likely in the distal stomach. There is mild cardiomegaly with mild vascular congestion. Right mid lung field linear atelectasis/scarring. No focal consolidation, pleural effusion, or pneumothorax. No acute osseous pathology. IMPRESSION: 1. Endotracheal tube above the carina. Enteric tube the left hemiabdomen likely in the distal stomach. The tip of the enteric tube is beyond the inferior margin of the image. 2. Cardiomegaly with mild vascular congestion. Electronically Signed   By: Anner Crete M.D.   On: 07/03/2018 04:42   Dg Abd Portable 1v  Result Date: 07/03/2018 CLINICAL DATA:  57 year old male with enteric tube placement. EXAM: PORTABLE ABDOMEN - 1 VIEW; PORTABLE CHEST - 1 VIEW COMPARISON:  Chest radiograph dated 07/02/2018 FINDINGS: Endotracheal tube with tip approximately 5 cm above the carina. An enteric tube extends below the diaphragm making a single turn in the left upper abdomen with tip extending inferiorly beyond the inferior margin of the image, likely in the distal stomach. There is mild cardiomegaly with mild vascular congestion. Right mid lung field linear atelectasis/scarring. No focal consolidation, pleural effusion, or pneumothorax. No acute osseous pathology. IMPRESSION: 1. Endotracheal tube above the carina. Enteric tube the left hemiabdomen likely in the distal stomach. The tip of the enteric tube is beyond the inferior margin of the image. 2. Cardiomegaly with mild vascular congestion. Electronically Signed   By: Anner Crete M.D.   On: 07/03/2018 04:42    Labs: BMET Recent Labs  Lab 06/28/18 0457 06/29/18 9629 06/30/18 0553 07/01/18 5284 07/02/18 0229 07/03/18 0710 07/03/18 0953 07/03/18 1612 07/04/18 0520  NA 151* 145 143 145 146* 153*  --   --  148*  K 4.0 3.0* 3.0* 3.3* 3.8 4.3  --   --  3.5  CL 122* 116* 111 117* 120* 124*  --   --  123*  CO2 21* 21* 21* 19* 18* 19*  --   --  19*  GLUCOSE 288* 230* 278* 241* 217* 304*  --   --  392*  BUN _0 24* 32*  --   --  45*  CREATININE 1.81* 1.69* 1.67* 2.17* 2.86* 3.15*  --   --  3.22*  CALCIUM 10.5* 10.5* 10.7* 10.8* 10.9* 10.4*  --   --  10.0  PHOS 2.8 2.4* 2.8  --  3.2  --  4.8* 4.4 4.1   CBC Recent Labs  Lab 06/30/18 0553 07/01/18 0627 07/03/18 0710 07/04/18 0520  WBC 12.3* 11.5* 10.0 12.5*  NEUTROABS  --   --   --  10.5*  HGB 12.4* 11.9* 12.2* 10.9*  HCT 41.0 39.8 41.4 37.3*  MCV 98.3 98.8 103.0* 104.8*  PLT 204 179 112* 77*    Medications:    .  aspirin  81 mg Per Tube Daily  . chlorhexidine gluconate (MEDLINE KIT)  15 mL Mouth Rinse BID  . citalopram  40 mg Per Tube Daily  . clonazepam  0.25 mg Per Tube TID  . famotidine  20 mg Per Tube Daily  . feeding supplement (PRO-STAT SUGAR FREE 64)  60 mL Per Tube BID  . finasteride  5 mg Oral Daily  . folic acid  1 mg Intravenous Daily  . free water  200 mL Per Tube Q8H  . heparin  5,000 Units Subcutaneous Q8H  . insulin aspart  0-20 Units Subcutaneous Q4H  . insulin glargine  14 Units Subcutaneous BID  . lamoTRIgine  400 mg Per Tube QHS  . mouth rinse  15 mL Mouth Rinse 10 times per day  . multivitamin  15 mL Per Tube Daily  . nystatin   Topical BID  . QUEtiapine  25 mg Per Tube QHS  . tamsulosin  0.8 mg Oral Daily  . thiamine injection  100 mg Intravenous Q24H      Otelia Santee, MD 07/04/2018, 10:42 AM

## 2018-07-04 NOTE — Progress Notes (Signed)
NAME:  Frank Cox, MRN:  [id number], DOB:  08/11/1961, LOS: 40 ADMISSION DATE:  06/07/2018, CONSULTATION DATE:  06/27/2018 REFERRING MD:  Stark Jock, ED [facility], CHIEF COMPLAINT:  Alerted mental status.  Brief History   57 year old man who requires urgent hemodialysis for acute on chronic lithium toxicity. Has been increasingly confused, experiencing multiple falls over the last week.  Li level 2.37 (upper limit 1.2) and nephrology wishes to dialyze the patient.   He was transferred out of ICU on 10/20.  On 10/24, he had agitation that did not respond to ativan or haldol; therefore, PCCM was called back for transfer to ICU.  Past Medical History  Bipolar affective disorder, T2DM on OHA, HTN, Prostate cancer, OSA on CPAP.  Significant Hospital Events   10/18 Admit with acute renal failure (cr 1.61) Foley catheter > 2 L immediately.  Underwent emergent dialysis for lithium toxicity 10/19 Lithium normalized.   10/20 Tx to SDU 10/24 Tx back to ICU due to agitation 10/26 PCCM called back for AMS, intermittent agitation 10/28 Intubated   Consults: date of consult/date signed off & final recs:  Nephrology 10/18 - recommending HD Poison Control - recommending hydration, following serial Li levels. Psych 10/19 - do not restart lithium, needs psych consult once encephalopathy clears, recommending inpatient psych admission once medically cleared Urology 10/19 - input regarding possible obstructive uropathy, they recommend keeping foley in and follow up as outpatient  Procedures (surgical and bedside):  10/29 [facility] CVC   Significant Diagnostic Tests:  CT head 10/15 > negative. Renal US 10/18 > diabetic glomerular nephropathy.  No obstruction.  Bilateral benign appearing renal cyts.  CT Head 10/29 >> CT Abd Pelvis 10/29 >> EEG 10/29 >>>  Micro Data:  Urine 10/18 > negative Blood 10/20 > negative Blood 10/23 > negative  Urine 10/23 > negative Penile culture 10/22 >> staph lugdunensis (S-,  entercoccus (vanco sens)  UCx 10/28 >> Pending  Blood 10/28 >> Pending  Trach Asp 10/28 >> pending   Antimicrobials:  Zosyn 10/20 > 10/21 Ceftriaxone 10/21 > 10/23 Unasyn 10/26 >>> Oral Vanco 10/28, c diff neg, stopped   Zosyn 10/28 Vanco 10/28 Metronidazole 10/28    Subjective:  CVC overnight, art line overnight. Not well sedated this morning. Not following commands. Was hypotensive on neo for a short period.   Objective   Blood pressure (!) 108/56, pulse (!) 129, temperature (!) 100.7 F (38.2 C), temperature source Axillary, resp. rate (!) 30, height 6\' 2"  (1.88 m), weight (!) 139.2 kg, SpO2 90 %.    Vent Mode: PRVC FiO2 (%):  [50 %-60 %] 60 % Set Rate:  [24 bmp] 24 bmp Vt Set:  [650 mL] 650 mL PEEP:  [5 cmH20] 5 cmH20 Plateau Pressure:  [15 cmH20-20 cmH20] 18 cmH20   Intake/Output Summary (Last 24 hours) at 07/04/2018 0818 Last data filed at 07/04/2018 4098 Gross per 24 hour  Intake 5333.51 ml  Output 2205 ml  Net 3128.51 ml   Filed Weights   06/29/18 1350 07/04/18 0500  Weight: (!) 139.6 kg (!) 139.2 kg    Examination: General appearance: 57 y.o., male, intubated on mechanical ventilation  Eyes: anicteric sclerae, moist conjunctivae, small pupils, sluggish to light  HENT: NCAT; oropharynx, MMM Neck: Trachea midline; ETT in place  Lungs: CTAB, no crackles, no wheeze,BL vented breath sounds  CV: RRR, S1, S2, no MRGs  Abdomen: Soft, non-tender; non-distended, BS present  Extremities: No peripheral edema, radial and DP pulses present bilaterally  Skin:  hyperpigmented skin over anterior shins  Psych: agitated, possible ongoing delirium  Neuro: moving all four extremities, in restriaints   Assessment & Plan:   Septic Shock, unclear source, possible aspiration after being obtunded due to AMS, small right sided infiltrate, could represent atelectasis, placed, fever 103F, prior cx with staph and enterococcus from penile discharge`  - Fever Tmax 103F - c diff  neg, cultures pending  P: Broadened antibiotics, to Cefepime, vanco, metro Pan-cultures pending  Will de-escalate pending fever curve and cx results  Possible need for foley exchange Will get CT Abd/pelvis looking for fever source  May need to get ID input   CXR reviewed - small infiltrate verse atelectasis, The patient's images have been independently reviewed by me.    Acute Hypoxemic Hypercarbic Respiratory Failure  Assume worsening PCO2 likely from mental status P: Remains on MV, goal RASS -1 Reviewed repeat ABG   Acute toxic, metabolic encephalopathy secondary to above  Agitated Delirium   -intermittent, initially thought related to ETOH withdrawal, AKI, lithium toxicity - seemingly undifferentiated at this time, likely related to multiple etiologies  P: Continue fent ggt and precedex for sedation  Discussed sedation strategies with the nurse  Recommend coming down on the fent drip rate  Still holding some of his home meds Need to keep low dose bdz due to chronic use  Will get EEG today   Penile Drainage, remains possible source of infection   -culture positive for staph, enterococcus P: - awaiting urine culture  Acute on Chronic Lithium Toxicity with Acute Toxic Encephalopathy  - s/p HD on 10/18 with normalization of Li levels. P: - we appreciate nephrology recommendations   Bipolar Disorder  - patient denied suicidal intent.   -S/p psych eval 10/19 P: Decreased PTA home meds   Probable OSA / OHS P: On MV at this time, intubated   Acute on Chronic Renal Failure, Stage 2 CKD Nephrogenic DI from lithium, ?resolved Hypertnatremia, FWD, Na+ load given Hyperchloremia, from NaCL admin and NaHCO3 Hypokalemia   P: We appreciate nephrology recommendations  Continue free water replacement   Bladder outlet obstruction  -s/p foley by urology P: Foley in place, proscar and flomax   Thrombocytopenia P: I believe this is related to ongoing sepsis  Will  observe If less than 50k will stop heparin dvt ppx tomorrow   DM P: SSI and CBGs lantus increased   Disposition / Summary of Today's Plan 07/04/18   Nephrology to see again  Fix electrolytes     Diet: Regular diet if awake Pain/Anxiety/Delirium protocol (if indicated): N/A. VAP protocol (if indicated): N/A. DVT prophylaxis: heparin  GI prophylaxis: N/A. Hyperglycemia protocol: SSI Mobility: Bedrest. Code Status: full. Family Communication: No family 10/27.  Labs (personally reviewed)  CBC: Recent Labs  Lab 06/29/18 0339 06/30/18 0553 07/01/18 0627 07/03/18 0710 07/04/18 0520  WBC 11.1* 12.3* 11.5* 10.0 12.5*  NEUTROABS  --   --   --   --  10.5*  HGB 12.5* 12.4* 11.9* 12.2* 10.9*  HCT 40.2 41.0 39.8 41.4 37.3*  MCV 97.8 98.3 98.8 103.0* 104.8*  PLT 201 204 179 112* 77*    Basic Metabolic Panel: Recent Labs  Lab 06/30/18 0553 07/01/18 0627 07/02/18 0229 07/03/18 0710 07/03/18 0953 07/03/18 1612 07/04/18 0520  NA 143 145 146* 153*  --   --  148*  K 3.0* 3.3* 3.8 4.3  --   --  3.5  CL 111 117* 120* 124*  --   --  123*  CO2 21* 19* 18* 19*  --   --  19*  GLUCOSE 278* 241* 217* 304*  --   --  392*  BUN 13 16 24* 32*  --   --  45*  CREATININE 1.67* 2.17* 2.86* 3.15*  --   --  3.22*  CALCIUM 10.7* 10.8* 10.9* 10.4*  --   --  10.0  MG  --  2.5* 2.8*  --  2.9* 2.8* 2.6*  PHOS 2.8  --  3.2  --  4.8* 4.4 4.1   GFR: Estimated Creatinine Clearance: 37.1 mL/min (A) (by C-G formula based on SCr of 3.22 mg/dL (H)). Recent Labs  Lab 06/29/18 0339 06/30/18 0553 07/01/18 0627 07/01/18 1654 07/02/18 0229 07/03/18 0710 07/04/18 0520  PROCALCITON 0.59  --   --  0.58 0.63 0.47  --   WBC 11.1* 12.3* 11.5*  --   --  10.0 12.5*  LATICACIDVEN  --   --   --  1.1  --   --   --     Liver Function Tests: Recent Labs  Lab 06/28/18 0457 06/29/18 0339 06/30/18 0553 07/01/18 0627 07/04/18 0520  AST  --   --   --  28 22  ALT  --   --   --  40 24  ALKPHOS  --   --    --  62 48  BILITOT  --   --   --  0.7 0.6  PROT  --   --   --  6.2* 6.2*  ALBUMIN 3.1* 3.1* 3.2* 3.0* 2.7*   No results for input(s): LIPASE, AMYLASE in the last 168 hours. No results for input(s): AMMONIA in the last 168 hours.  ABG    Component Value Date/Time   PHART 7.314 (L) 07/03/2018 2130   PCO2ART 36.5 07/03/2018 2130   PO2ART 74.0 (L) 07/03/2018 2130   HCO3 18.6 (L) 07/03/2018 2130   TCO2 20 (L) 07/03/2018 2130   ACIDBASEDEF 7.0 (H) 07/03/2018 2130   O2SAT 94.0 07/03/2018 2130     Coagulation Profile: No results for input(s): INR, PROTIME in the last 168 hours.  Cardiac Enzymes: No results for input(s): CKTOTAL, CKMB, CKMBINDEX, TROPONINI in the last 168 hours.  HbA1C: Hemoglobin A1C  Date/Time Value Ref Range Status  08/25/2015 9.9  Final   Hgb A1c MFr Bld  Date/Time Value Ref Range Status  07/19/2017 08:30 AM 5.5 4.6 - 6.5 % Final    Comment:    Glycemic Control Guidelines for People with Diabetes:Non Diabetic:  <6%Goal of Therapy: <7%Additional Action Suggested:  >8%   04/21/2017 10:13 AM 5.7 4.6 - 6.5 % Final    Comment:    Glycemic Control Guidelines for People with Diabetes:Non Diabetic:  <6%Goal of Therapy: <7%Additional Action Suggested:  >8%     CBG: Recent Labs  Lab 07/03/18 1910 07/03/18 2325 07/04/18 0058 07/04/18 0319 07/04/18 0741  GLUCAP 217* 295* 360* 349* 382*    This patient is critically ill with multiple organ system failure; which, requires frequent high complexity decision making, assessment, support, evaluation, and titration of therapies. This was completed through the application of advanced monitoring technologies and extensive interpretation of multiple databases. During this encounter critical care time was devoted to patient care services described in this note for 42 minutes.   Garner Nash, DO [facility] 07/04/2018 9:04 AM  Personal pager: [phone] If unanswered, please page CCM On-call:  [phone]

## 2018-07-05 ENCOUNTER — Inpatient Hospital Stay (HOSPITAL_COMMUNITY): Payer: 59

## 2018-07-05 DIAGNOSIS — D7589 Other specified diseases of blood and blood-forming organs: Secondary | ICD-10-CM

## 2018-07-05 DIAGNOSIS — G9341 Metabolic encephalopathy: Secondary | ICD-10-CM

## 2018-07-05 LAB — COMPREHENSIVE METABOLIC PANEL
ALBUMIN: 2.6 g/dL — AB (ref 3.5–5.0)
ALT: 22 U/L (ref 0–44)
AST: 21 U/L (ref 15–41)
Alkaline Phosphatase: 45 U/L (ref 38–126)
Anion gap: 9 (ref 5–15)
BUN: 41 mg/dL — AB (ref 6–20)
CHLORIDE: 125 mmol/L — AB (ref 98–111)
CO2: 18 mmol/L — ABNORMAL LOW (ref 22–32)
Calcium: 10.3 mg/dL (ref 8.9–10.3)
Creatinine, Ser: 2.88 mg/dL — ABNORMAL HIGH (ref 0.61–1.24)
GFR calc Af Amer: 26 mL/min — ABNORMAL LOW (ref >60)
GFR, EST NON AFRICAN AMERICAN: 23 mL/min — AB (ref >60)
Glucose, Bld: 329 mg/dL — ABNORMAL HIGH (ref 70–99)
POTASSIUM: 3.6 mmol/L (ref 3.5–5.1)
Sodium: 152 mmol/L — ABNORMAL HIGH (ref 135–145)
Total Bilirubin: 0.5 mg/dL (ref 0.3–1.2)
Total Protein: 6 g/dL — ABNORMAL LOW (ref 6.5–8.1)

## 2018-07-05 LAB — CBC WITH DIFFERENTIAL/PLATELET
ABS IMMATURE GRANULOCYTES: 0.18 10*3/uL — AB (ref 0.00–0.07)
BASOS ABS: 0 10*3/uL (ref 0.0–0.1)
BASOS PCT: 0 %
Eosinophils Absolute: 0 10*3/uL (ref 0.0–0.5)
Eosinophils Relative: 0 %
HCT: 37.4 % — ABNORMAL LOW (ref 39.0–52.0)
HEMOGLOBIN: 10.6 g/dL — AB (ref 13.0–17.0)
Immature Granulocytes: 1 %
LYMPHS PCT: 10 %
Lymphs Abs: 1.8 10*3/uL (ref 0.7–4.0)
MCH: 30.5 pg (ref 26.0–34.0)
MCHC: 28.3 g/dL — AB (ref 30.0–36.0)
MCV: 107.5 fL — ABNORMAL HIGH (ref 80.0–100.0)
Monocytes Absolute: 1.3 10*3/uL — ABNORMAL HIGH (ref 0.1–1.0)
Monocytes Relative: 7 %
NEUTROS ABS: 14.8 10*3/uL — AB (ref 1.7–7.7)
NRBC: 0.3 % — AB (ref 0.0–0.2)
Neutrophils Relative %: 82 %
PLATELETS: 69 10*3/uL — AB (ref 150–400)
RBC: 3.48 MIL/uL — AB (ref 4.22–5.81)
RDW: 14.6 % (ref 11.5–15.5)
WBC: 18 10*3/uL — AB (ref 4.0–10.5)

## 2018-07-05 LAB — DIRECT ANTIGLOBULIN TEST (NOT AT ARMC)
DAT, COMPLEMENT: NEGATIVE
DAT, IgG: NEGATIVE

## 2018-07-05 LAB — GLUCOSE, CAPILLARY
GLUCOSE-CAPILLARY: 241 mg/dL — AB (ref 70–99)
GLUCOSE-CAPILLARY: 310 mg/dL — AB (ref 70–99)
GLUCOSE-CAPILLARY: 286 mg/dL — AB (ref 70–99)
GLUCOSE-CAPILLARY: 227 mg/dL — AB (ref 70–99)
Glucose-Capillary: 272 mg/dL — ABNORMAL HIGH (ref 70–99)
Glucose-Capillary: 153 mg/dL — ABNORMAL HIGH (ref 70–99)

## 2018-07-05 LAB — CULTURE, RESPIRATORY: CULTURE: NORMAL

## 2018-07-05 LAB — LACTATE DEHYDROGENASE: LDH: 320 U/L — AB (ref 98–192)

## 2018-07-05 LAB — PHOSPHORUS: Phosphorus: 3.4 mg/dL (ref 2.5–4.6)

## 2018-07-05 LAB — VITAMIN B12: VITAMIN B 12: 530 pg/mL (ref 180–914)

## 2018-07-05 LAB — TECHNOLOGIST SMEAR REVIEW

## 2018-07-05 LAB — CULTURE, RESPIRATORY W GRAM STAIN

## 2018-07-05 LAB — MAGNESIUM: MAGNESIUM: 2.7 mg/dL — AB (ref 1.7–2.4)

## 2018-07-05 MED ORDER — FREE WATER
250.0000 mL | Status: DC
  Administered 2018-07-05 – 2018-07-10 (×30): 250 mL

## 2018-07-05 MED ORDER — SODIUM CHLORIDE 0.45 % IV SOLN
INTRAVENOUS | Status: DC
  Administered 2018-07-05 – 2018-07-06 (×3): via INTRAVENOUS

## 2018-07-05 MED ORDER — LAMOTRIGINE 100 MG PO TABS
200.0000 mg | ORAL_TABLET | Freq: Every day | ORAL | Status: DC
  Administered 2018-07-05 – 2018-07-16 (×12): 200 mg
  Filled 2018-07-05 (×12): qty 2

## 2018-07-05 MED ORDER — ATROPINE SULFATE 1 MG/10ML IJ SOSY
PREFILLED_SYRINGE | INTRAMUSCULAR | Status: AC
  Filled 2018-07-05: qty 10

## 2018-07-05 MED ORDER — CITALOPRAM HYDROBROMIDE 10 MG PO TABS
10.0000 mg | ORAL_TABLET | Freq: Every day | ORAL | Status: DC
  Administered 2018-07-05 – 2018-07-12 (×8): 10 mg
  Filled 2018-07-05 (×8): qty 1

## 2018-07-05 MED ORDER — PANTOPRAZOLE SODIUM 40 MG PO PACK
40.0000 mg | PACK | Freq: Every day | ORAL | Status: DC
  Administered 2018-07-05 – 2018-07-17 (×13): 40 mg
  Filled 2018-07-05 (×14): qty 20

## 2018-07-05 NOTE — Progress Notes (Signed)
Patient was given 2 mg versed at 1458 for agitation.  Approximately 30 minutes after administration, patient began experiencing bradycardic episodes with heart rate going into the 30's but would come back to NSR within approximately 30 seconds.  12 lead EKG was taken and CCM was paged.  Instructed to turn Precedex to half of what it was running and use fentanyl and versed for agitation purposes.  Will continue to monitor.  Hiram Gash RN

## 2018-07-05 NOTE — Progress Notes (Signed)
NAME:  Frank Cox, MRN:  [id number], DOB:  01-Jun-1961, LOS: 12 ADMISSION DATE:  07/01/2018, CONSULTATION DATE:  06/19/2018 REFERRING MD:  Stark Jock, ED [facility], CHIEF COMPLAINT:  Alerted mental status.  Brief History   57 year old man who requires urgent hemodialysis for acute on chronic lithium toxicity. Has been increasingly confused, experiencing multiple falls over the last week.  Li level 2.37 (upper limit 1.2) and nephrology wishes to dialyze the patient.   He was transferred out of ICU on 10/20.  On 10/24, he had agitation that did not respond to ativan or haldol; therefore, PCCM was called back for transfer to ICU.  Past Medical History  Bipolar affective disorder, T2DM on OHA, HTN, Prostate cancer, OSA on CPAP.  Significant Hospital Events   10/18 Admit with acute renal failure (cr 1.61) Foley catheter > 2 L immediately.  Underwent emergent dialysis for lithium toxicity 10/19 Lithium normalized.   10/20 Tx to SDU 10/24 Tx back to ICU due to agitation 10/26 PCCM called back for AMS, intermittent agitation 10/28 Intubated   Consults: date of consult/date signed off & final recs:  Nephrology 10/18 - recommending HD Poison Control - recommending hydration, following serial Li levels. Psych 10/19 - do not restart lithium, needs psych consult once encephalopathy clears, recommending inpatient psych admission once medically cleared Urology 10/19 - input regarding possible obstructive uropathy, they recommend keeping foley in and follow up as outpatient  Procedures (surgical and bedside):  10/29 [facility] CVC   Significant Diagnostic Tests:  CT head 10/15 > negative. Renal US 10/18 > diabetic glomerular nephropathy.  No obstruction.  Bilateral benign appearing renal cyts.  CT Head 10/29 >> Neg for acute process, enlarge orbital vein? CT Abd Pelvis 10/29 >> Negative for infectious source, fluid in colon   EEG 10/29 >>> slowing, no seizure   Micro Data:  Urine 10/18 > negative Blood 10/20  > negative Blood 10/23 > negative  Urine 10/23 > negative Penile culture 10/22 >> staph lugdunensis (S-, entercoccus (vanco sens)  UCx 10/28 >> NGTD  Blood 10/28 >> NGTD   Trach Asp 10/28 >> NGTD    Antimicrobials:  Zosyn 10/20 > 10/21 Ceftriaxone 10/21 > 10/23 Unasyn 10/26 >>> Oral Vanco 10/28, c diff neg, stopped   Cefepime 10/28 Vanco 10/28 Metronidazole 10/28    Subjective:  Fevers better. 4L urine out since yesterday. No diuresis. Agitation overnight. On fent and precedex. Hypotensive again overnight requiring pressors again but lost his A-line   Objective   Blood pressure (!) 95/52, pulse 77, temperature (!) 100.6 F (38.1 C), temperature source Oral, resp. rate (!) 29, height 6\' 2"  (1.88 m), weight (!) 139.2 kg, SpO2 100 %.    Vent Mode: PRVC FiO2 (%):  [60 %] 60 % Set Rate:  [24 bmp] 24 bmp Vt Set:  [650 mL] 650 mL PEEP:  [5 cmH20] 5 cmH20 Plateau Pressure:  [17 cmH20-20 cmH20] 20 cmH20   Intake/Output Summary (Last 24 hours) at 07/05/2018 9924 Last data filed at 07/05/2018 0700 Gross per 24 hour  Intake 6486.38 ml  Output 4580 ml  Net 1906.38 ml   Filed Weights   06/29/18 1350 07/04/18 0500  Weight: (!) 139.6 kg (!) 139.2 kg    Examination: General appearance: 57 y.o., male, intubated on mechanical ventilation  Eyes: anicteric sclerae, moist conjunctivae HENT: NCAT; oropharynx, MMM Neck: Trachea midline, ETT  Lungs: BL vented breath sounds  CV: tachy, regular, S1, S2 Abdomen: Soft, non-tender; non-distended, BS present  Extremities: dependent  peripheral edema, radial and DP pulses present bilaterally  Skin: Normal temperature, turgor and texture; no rash Psych: possible ongoing active delirium  Neuro: sedation, no response to pain, moving all four spontaneously, no purposeful movement   Assessment & Plan:   Septic Shock, unclear source, possible aspiration after being obtunded due to AMS, small right sided infiltrate, could represent atelectasis,  placed, fever 103F, prior cx with staph and enterococcus from penile discharge`  - Fever Tmax 103F, ?drug induced, it has currently stopped, on cooling blanket  - c diff neg, cultures NGTD  P: Maintain cefepime, vanco, metro Diflucan started by ID  CCM appreciates ID input  May need a-line again   CXR reviewed: basilar atelectasis vs small infiltrate?  The patient's images have been independently reviewed by me.    Acute Hypoxemic Hypercarbic Respiratory Failure  Hypercarbia was likely due to AMS  P: Slowly weaning from vent  No changes in vent settings at this time   Acute toxic, metabolic encephalopathy secondary to above  Agitated Delirium   - intermittent, initially thought related to ETOH withdrawal, AKI, lithium toxicity - seemingly undifferentiated at this time, likely related to multiple etiologies - ?lingering medication effect from home psych meds   - EEG with no seizure  P: Fent and precedex for sedation  Decreased lamictal to 50% of home dose  Decreased seroquel  Lithium held Low dose BDZ due to chronic dosing history   Penile Drainage, remains possible source of infection   -culture positive for staph, enterococcus P: - on abx as above   Acute on Chronic Lithium Toxicity with Acute Toxic Encephalopathy  - s/p HD on 10/18 with normalization of Li levels. P: - ?could he still have lingering effects of Li+, nephrogenic DI?  Bipolar Disorder  - patient denied suicidal intent.   -S/p psych eval 10/19 P: Decreased PTA home meds   Probable OSA / OHS P: On MV at this time, intubated   Acute on Chronic Renal Failure, Stage 2 CKD Nephrogenic DI from lithium, ?resolved Hypertnatremia, FWD, Na+ load given Hyperchloremia, from NaCL admin and NaHCO3 Hypokalemia   P: Continuing free water replacement, increased to 250 q4h  Bladder outlet obstruction  -s/p foley by urology P: Foley in place flomax and proscar   Macrocytic Anemia  - question if we are  dealing with a BM suppressing drug complication Thrombocytopenia P: Observing Moderate risk 4T score But with fevers and drop in platelets could be drug effect Will stop H2B and use PPI  Will check b12  DM P: Increased lantus  CBGs with SSI   Disposition / Summary of Today's Plan 07/05/18   Attempting weaning Fevers stable     Diet: TF  Pain/Anxiety/Delirium protocol (if indicated): Yes  VAP protocol (if indicated): Yes  DVT prophylaxis: heparin  GI prophylaxis: N/A. Hyperglycemia protocol: SSI Mobility: Bedrest. Code Status: full. Family Communication: No family has visited   Labs (personally reviewed)  CBC: Recent Labs  Lab 06/30/18 0553 07/01/18 0627 07/03/18 0710 07/04/18 0520 07/05/18 0544  WBC 12.3* 11.5* 10.0 12.5* 18.0*  NEUTROABS  --   --   --  10.5* 14.8*  HGB 12.4* 11.9* 12.2* 10.9* 10.6*  HCT 41.0 39.8 41.4 37.3* 37.4*  MCV 98.3 98.8 103.0* 104.8* 107.5*  PLT 204 179 112* 77* 69*    Basic Metabolic Panel: Recent Labs  Lab 07/01/18 0627 07/02/18 0229 07/03/18 0710 07/03/18 0953 07/03/18 1612 07/04/18 0520 07/05/18 0544  NA 145 146* 153*  --   --  148* 152*  K 3.3* 3.8 4.3  --   --  3.5 3.6  CL 117* 120* 124*  --   --  123* 125*  CO2 19* 18* 19*  --   --  19* 18*  GLUCOSE 241* 217* 304*  --   --  392* 329*  BUN 16 24* 32*  --   --  45* 41*  CREATININE 2.17* 2.86* 3.15*  --   --  3.22* 2.88*  CALCIUM 10.8* 10.9* 10.4*  --   --  10.0 10.3  MG 2.5* 2.8*  --  2.9* 2.8* 2.6* 2.7*  PHOS  --  3.2  --  4.8* 4.4 4.1 3.4   GFR: Estimated Creatinine Clearance: 41.5 mL/min (A) (by C-G formula based on SCr of 2.88 mg/dL (H)). Recent Labs  Lab 06/29/18 0339  07/01/18 0627 07/01/18 1654 07/02/18 0229 07/03/18 0710 07/04/18 0520 07/05/18 0544  PROCALCITON 0.59  --   --  0.58 0.63 0.47  --   --   WBC 11.1*   < > 11.5*  --   --  10.0 12.5* 18.0*  LATICACIDVEN  --   --   --  1.1  --   --   --   --    < > = values in this interval not displayed.      Liver Function Tests: Recent Labs  Lab 06/29/18 0339 06/30/18 0553 07/01/18 0627 07/04/18 0520 07/05/18 0544  AST  --   --  28 22 21   ALT  --   --  40 24 22  ALKPHOS  --   --  62 48 45  BILITOT  --   --  0.7 0.6 0.5  PROT  --   --  6.2* 6.2* 6.0*  ALBUMIN 3.1* 3.2* 3.0* 2.7* 2.6*   No results for input(s): LIPASE, AMYLASE in the last 168 hours. No results for input(s): AMMONIA in the last 168 hours.  ABG    Component Value Date/Time   PHART 7.314 (L) 07/03/2018 2130   PCO2ART 36.5 07/03/2018 2130   PO2ART 74.0 (L) 07/03/2018 2130   HCO3 18.6 (L) 07/03/2018 2130   TCO2 20 (L) 07/03/2018 2130   ACIDBASEDEF 7.0 (H) 07/03/2018 2130   O2SAT 94.0 07/03/2018 2130     Coagulation Profile: No results for input(s): INR, PROTIME in the last 168 hours.  Cardiac Enzymes: No results for input(s): CKTOTAL, CKMB, CKMBINDEX, TROPONINI in the last 168 hours.  HbA1C: Hemoglobin A1C  Date/Time Value Ref Range Status  08/25/2015 9.9  Final   Hgb A1c MFr Bld  Date/Time Value Ref Range Status  07/19/2017 08:30 AM 5.5 4.6 - 6.5 % Final    Comment:    Glycemic Control Guidelines for People with Diabetes:Non Diabetic:  <6%Goal of Therapy: <7%Additional Action Suggested:  >8%   04/21/2017 10:13 AM 5.7 4.6 - 6.5 % Final    Comment:    Glycemic Control Guidelines for People with Diabetes:Non Diabetic:  <6%Goal of Therapy: <7%Additional Action Suggested:  >8%     CBG: Recent Labs  Lab 07/04/18 1510 07/04/18 1923 07/04/18 2322 07/05/18 0309 07/05/18 0747  GLUCAP 257* 207* 249* 241* 310*    This patient is critically ill with multiple organ system failure; which, requires frequent high complexity decision making, assessment, support, evaluation, and titration of therapies. This was completed through the application of advanced monitoring technologies and extensive interpretation of multiple databases. During this encounter critical care time was devoted to patient care services  described in  this note for 45 minutes.   Garner Nash, DO [place] [facility] 07/05/2018 8:34 AM  Personal pager: [phone] If unanswered, please page CCM On-call: [phone]

## 2018-07-05 NOTE — Plan of Care (Signed)
Pt currently receiving tube feeds via OG tube.  Once extubated will reevaluate nutrition.   Hiram Gash RN

## 2018-07-05 NOTE — Progress Notes (Addendum)
PCCM:  I called and left a message for urology to evaluate the patient regarding need for foley exchange.   Garner Nash, DO [place] [facility] 07/05/2018 3:11 PM  Personal pager: [phone] If unanswered, please page CCM On-call: [phone]  I spoke with urology. They felt as if nursing could attempt replacement of foley with normal procedure. If there is trouble with replacement to call them and they will be happy to see. This message was relayed to nursing staff.   Garner Nash, DO [facility] 07/05/2018 6:50 PM  Personal pager: [phone] If unanswered, please page CCM On-call: [phone]

## 2018-07-05 NOTE — Progress Notes (Signed)
[facility] Progress Note    Assessment/ Plan:   MS- lithium level 2.37 and crt of 1.75    1. Renal- HD times one on 10/18 for lithium toxicity, level normalized. Then discovered to have BOO s/p foley -then polyuria and hypernatremia either due to post obs diuresis or nephrogenic DI from lithium (when pt lucid he did report longstanding thirst so think is neph DI)- UOP slowed down onamiloride for nephrogenic DI and follow. - last dose of amiloride 10/28  - Urine microscopy shows a few pigmented casts which suggest ATN but not florid; hopefully he is still early in the process and it can be reversed with hydration.  - Fortunately renal function improved overnight with great UOP; hopefully that will continue.  2. HTN/vol-Actually BP is now on low side. 3. Anemia- not an issue 4. ID-Cultures and rocephin per primary- penile drainage enterococcus faecalis and staph lugdunensis on cefepime, flagyl, Unasyn and Vancomycin (doesn't appear to have been given yet) 5. Hypernatremia - either due to BOO and post obstr diuresis (less likely ) vs DI from lithium (more likely)-Had been improvingand now worse again. - Will repeat free water clearance in the urine in the next 48hrs (losing signif amount thru urine)   - Did not tolerate D5W at high rates bec of hyperglycemia. - Change the free water via OGT to 266m q4hr as tolerated on 10/30 - d/w pharmacy and not able to use 1/4 ns bec of risk of hemolysis.  - Let's change the D5W to 1/2NS at 1565mhr which will be maintenance for the free water losses in the urine (at least for past 24hr 1.9L of free water loss via urine) and then free water via OG will help correct and stabilize. - Would like to start Amiloride again but BP was very soft, unclear if real or note. May need a new A-line for more accurate readings; also on pressors. - Unable to give NSAID bec of the AKI.  6. Hypokalemia- repleting (3.6) 7. Psych-  behaviors requiring transfer to ICU and IV sedation  Subjective:   On vent but very agitated req sedation.  UOP very good.   Objective:   BP (!) 74/47   Pulse 98   Temp (!) 100.6 F (38.1 C) (Oral)   Resp (!) 29   Ht 6' 2"  (1.88 m)   Wt (!) 139.2 kg   SpO2 95%   BMI 39.40 kg/m   Intake/Output Summary (Last 24 hours) at 07/05/2018 [id number] data filed at 07/05/2018 0900 Gross per 24 hour  Intake 7564.09 ml  Output 4580 ml  Net 2984.09 ml   Weight change:   Physical Exam: General:resting, restraints Heart: RRR Lungs: clear Abdomen: soft, non tender, decr freq Extremities: tr edema Dialysis Access: had right subclavian temp cath - now removed  Imaging: Ct Abdomen Pelvis Wo Contrast  Result Date: 07/04/2018 CLINICAL DATA:  Persistent diarrhea and fevers EXAM: CT ABDOMEN AND PELVIS WITHOUT CONTRAST TECHNIQUE: Multidetector CT imaging of the abdomen and pelvis was performed following the standard protocol without IV contrast. COMPARISON:  Ultrasound kidneys 06/30/2018, CT from 01/11/2011 FINDINGS: Lower chest: Mild bibasilar atelectasis is noted. No sizable effusion is seen. Hepatobiliary: Liver is well visualized and within normal limits. The gallbladder demonstrates dependent gallstones without gallbladder wall thickening or pericholecystic fluid. Pancreas: Unremarkable. No pancreatic ductal dilatation or surrounding inflammatory changes. Spleen: Normal in size without focal abnormality. Adrenals/Urinary Tract: Adrenal glands are within normal limits. Kidneys are well visualized bilaterally. No renal calculi or  obstructive changes are seen. Some rounded exophytic lesions are noted in the right kidney likely representing cysts but incompletely evaluated on this exam. These were previously shown to represent cysts on prior ultrasound examination from 06/27/2018. Foley catheter decompresses the bladder. Stomach/Bowel: Nasogastric catheter is noted extending into the second  portion of the duodenum. Postsurgical changes in the stomach are noted consistent with prior bariatric surgery. The small bowel is decompressed. No inflammatory or obstructive changes are seen. The appendix is within normal limits. The colon is fluid filled without definitive obstructing lesion. No inflammatory changes are noted. Rectal tube is noted in place. Vascular/Lymphatic: Aortic atherosclerosis. No enlarged abdominal or pelvic lymph nodes. Reproductive: Prostate is unremarkable. Other: No abdominal wall hernia or abnormality. No abdominopelvic ascites. Musculoskeletal: Degenerative changes of lumbar spine are seen. IMPRESSION: Fluid-filled colon without obstructive change. Cholelithiasis without complicating factors. Bilateral lower lobe infiltrate/atelectasis. Chronic changes in the kidneys. Electronically Signed   By: Inez Catalina M.D.   On: 07/04/2018 13:14   Ct Head Wo Contrast  Result Date: 07/04/2018 CLINICAL DATA:  57 year old male with encephalopathy. Persistent fever. On ventilator. Subsequent encounter. EXAM: CT HEAD WITHOUT CONTRAST TECHNIQUE: Contiguous axial images were obtained from the base of the skull through the vertex without intravenous contrast. COMPARISON:  06/20/2018 head CT. FINDINGS: Brain: No intracranial hemorrhage or CT evidence of large acute infarct. No intracranial mass lesion noted on this unenhanced exam. Vascular: No hyperdense vessel.  Mild carotid artery calcifications. Skull: Negative Sinuses/Orbits: Exophthalmos. Slight prominence right superior ophthalmic vein without cause identified. Minimal mucosal thickening right maxillary sinus and left sphenoid sinus. Other: Mastoid air cells and middle ear cavities are clear. IMPRESSION: 1. No acute intracranial abnormality noted however, unenhanced head CT cannot adequately assess for the possibility of intracranial infection (which would require contrast enhanced MR if clinically desired). 2. Exophthalmos. Slight  prominence right superior ophthalmic vein of indeterminate etiology or significance. No obvious cavernous sinus abnormality noted as a cause of this finding. 3. Minimal mucosal thickening right maxillary sinus and left sphenoid sinus. Electronically Signed   By: Genia Del M.D.   On: 07/04/2018 12:54   Portable Chest Xray  Result Date: 07/04/2018 CLINICAL DATA:  Respiratory failure, intubated patient, morbid obesity. EXAM: PORTABLE CHEST 1 VIEW COMPARISON:  Portable chest x-ray of July 04, 2018 at 12 midnight FINDINGS: The lungs are mildly hypoinflated. The interstitial markings remain increased. There is fluid in the minor fissure. The cardiac silhouette is enlarged and the pulmonary vascularity engorged. There is no large pleural effusion. The endotracheal tube tip projects 4.4 cm above the carina. The esophagogastric tube tip and proximal port project below the inferior margin of the image. The left internal jugular venous catheter tip projects over the proximal SVC. IMPRESSION: CHF with mild interstitial edema. Bibasilar atelectasis. Mild hypoinflation. These findings are stable. The support tubes are in reasonable position. Electronically Signed   By: David  Martinique M.D.   On: 07/04/2018 09:20   Dg Chest Port 1 View  Result Date: 07/04/2018 CLINICAL DATA:  57 year old male with central line placement. EXAM: PORTABLE CHEST 1 VIEW COMPARISON:  Chest radiograph dated 07/03/2018 FINDINGS: Interval placement of a left IJ central line with tip over central SVC. No pneumothorax. Endotracheal tube above the carina in similar position and enteric tube extending below the diaphragm with tip beyond the inferior margin of the image. Cardiomegaly with mild vascular congestion. Shallow inspiration with bibasilar atelectasis/scarring. No pleural effusion. No acute osseous pathology. IMPRESSION: Interval placement of a left IJ  central line with tip over central SVC. No pneumothorax. Electronically Signed   By:  Anner Crete M.D.   On: 07/04/2018 01:35    Labs: BMET Recent Labs  Lab 06/29/18 0600 06/30/18 4599 07/01/18 7741 07/02/18 0229 07/03/18 0710 07/03/18 0953 07/03/18 1612 07/04/18 0520 07/05/18 0544  NA 145 143 145 146* 153*  --   --  148* 152*  K 3.0* 3.0* 3.3* 3.8 4.3  --   --  3.5 3.6  CL 116* 111 117* 120* 124*  --   --  123* 125*  CO2 21* 21* 19* 18* 19*  --   --  19* 18*  GLUCOSE 230* 278* 241* 217* 304*  --   --  392* 329*  BUN 13 13 16  24* 32*  --   --  45* 41*  CREATININE 1.69* 1.67* 2.17* 2.86* 3.15*  --   --  3.22* 2.88*  CALCIUM 10.5* 10.7* 10.8* 10.9* 10.4*  --   --  10.0 10.3  PHOS 2.4* 2.8  --  3.2  --  4.8* 4.4 4.1 3.4   CBC Recent Labs  Lab 07/01/18 0627 07/03/18 0710 07/04/18 0520 07/05/18 0544  WBC 11.5* 10.0 12.5* 18.0*  NEUTROABS  --   --  10.5* 14.8*  HGB 11.9* 12.2* 10.9* 10.6*  HCT 39.8 41.4 37.3* 37.4*  MCV 98.8 103.0* 104.8* 107.5*  PLT 179 112* 77* 69*    Medications:    . aspirin  81 mg Per Tube Daily  . chlorhexidine gluconate (MEDLINE KIT)  15 mL Mouth Rinse BID  . citalopram  10 mg Per Tube Daily  . clonazepam  0.25 mg Per Tube TID  . feeding supplement (PRO-STAT SUGAR FREE 64)  60 mL Per Tube BID  . finasteride  5 mg Oral Daily  . folic acid  1 mg Intravenous Daily  . free water  250 mL Per Tube Q4H  . heparin  5,000 Units Subcutaneous Q8H  . insulin aspart  0-20 Units Subcutaneous Q4H  . insulin aspart  6 Units Subcutaneous Q4H  . insulin glargine  18 Units Subcutaneous BID  . lamoTRIgine  200 mg Per Tube QHS  . mouth rinse  15 mL Mouth Rinse 10 times per day  . multivitamin  15 mL Per Tube Daily  . nystatin   Topical BID  . pantoprazole sodium  40 mg Per Tube Daily  . QUEtiapine  25 mg Per Tube QHS  . tamsulosin  0.8 mg Oral Daily  . thiamine injection  100 mg Intravenous Q24H      Otelia Santee, MD 07/05/2018, 9:24 AM

## 2018-07-05 NOTE — Progress Notes (Signed)
Inpatient Diabetes Program Recommendations  AACE/ADA: New Consensus Statement on Inpatient Glycemic Control (2015)  Target Ranges:  Prepandial:   less than 140 mg/dL      Peak postprandial:   less than 180 mg/dL (1-2 hours)      Critically ill patients:  140 - 180 mg/dL   Lab Results  Component Value Date   GLUCAP 310 (H) 07/05/2018   HGBA1C 5.5 07/19/2017    Review of Glycemic Control Results for Frank Cox, Frank Cox (MRN [id number]) as of 07/05/2018 09:37  Ref. Range 07/04/2018 23:22 07/05/2018 03:09 07/05/2018 07:47  Glucose-Capillary Latest Ref Range: 70 - 99 mg/dL 249 (H) 241 (H) 310 (H)   Diabetes history:Type 2 DM Outpatient Diabetes medications:Amaryl 8 mg daily, Actos 15 mg daily, Trulicity 1.5 mg weekly, Invokamet XR 50-1000 mg daily Current orders for Inpatient glycemic control: Novolog resistant q 4 hours, Lantus 18 units BID, Novolog 6 units Q4H  Inpatient Diabetes Program Recommendations:    Consider increasing to Lantus 22 units BID and tube feed coverage to Novolog 8 units Q4H.   Thanks, Bronson Curb, MSN, RNC-OB Diabetes Coordinator [phone] (8a-5p)

## 2018-07-05 NOTE — Progress Notes (Signed)
Subjective: Intubated and sedated   Antibiotics:  Anti-infectives (From admission, onward)   Start     Dose/Rate Route Frequency Ordered Stop   07/05/18 1600  vancomycin (VANCOCIN) 1,750 mg in sodium chloride 0.9 % 500 mL IVPB     1,750 mg 250 mL/hr over 120 Minutes Intravenous Every 48 hours 07/03/18 1457     07/04/18 1830  fluconazole (DIFLUCAN) IVPB 200 mg     200 mg 100 mL/hr over 60 Minutes Intravenous Every 24 hours 07/04/18 1757     07/04/18 1500  ceFEPIme (MAXIPIME) 1 g in sodium chloride 0.9 % 100 mL IVPB     1 g 200 mL/hr over 30 Minutes Intravenous Every 24 hours 07/03/18 1457     07/03/18 1600  vancomycin (VANCOCIN) 2,000 mg in sodium chloride 0.9 % 500 mL IVPB     2,000 mg 250 mL/hr over 120 Minutes Intravenous NOW 07/03/18 1457 07/03/18 1815   07/03/18 1500  metroNIDAZOLE (FLAGYL) IVPB 500 mg     500 mg 100 mL/hr over 60 Minutes Intravenous Every 8 hours 07/03/18 1455     07/03/18 1500  ceFEPIme (MAXIPIME) 2 g in sodium chloride 0.9 % 100 mL IVPB     2 g 200 mL/hr over 30 Minutes Intravenous NOW 07/03/18 1457 07/03/18 1643   07/03/18 1000  vancomycin (VANCOCIN) 50 mg/mL oral solution 125 mg  Status:  Discontinued     125 mg Oral 4 times daily 07/03/18 0853 07/03/18 1427   07/02/18 2200  Ampicillin-Sulbactam (UNASYN) 3 g in sodium chloride 0.9 % 100 mL IVPB  Status:  Discontinued     3 g 200 mL/hr over 30 Minutes Intravenous Every 12 hours 07/02/18 1429 07/03/18 1454   07/01/18 1600  Ampicillin-Sulbactam (UNASYN) 3 g in sodium chloride 0.9 % 100 mL IVPB  Status:  Discontinued     3 g 200 mL/hr over 30 Minutes Intravenous Every 6 hours 07/01/18 1541 07/02/18 1429   06/26/18 1015  cefTRIAXone (ROCEPHIN) 1 g in sodium chloride 0.9 % 100 mL IVPB  Status:  Discontinued     1 g 200 mL/hr over 30 Minutes Intravenous Every 24 hours 06/26/18 0857 06/28/18 0958   06/25/18 1300  piperacillin-tazobactam (ZOSYN) IVPB 3.375 g  Status:  Discontinued     3.375 g 12.5  mL/hr over 240 Minutes Intravenous Every 8 hours 06/25/18 1210 06/26/18 0949      Medications: Scheduled Meds: . aspirin  81 mg Per Tube Daily  . atropine      . chlorhexidine gluconate (MEDLINE KIT)  15 mL Mouth Rinse BID  . citalopram  10 mg Per Tube Daily  . clonazepam  0.25 mg Per Tube TID  . feeding supplement (PRO-STAT SUGAR FREE 64)  60 mL Per Tube BID  . finasteride  5 mg Oral Daily  . folic acid  1 mg Intravenous Daily  . free water  250 mL Per Tube Q4H  . heparin  5,000 Units Subcutaneous Q8H  . insulin aspart  0-20 Units Subcutaneous Q4H  . insulin aspart  6 Units Subcutaneous Q4H  . insulin glargine  18 Units Subcutaneous BID  . lamoTRIgine  200 mg Per Tube QHS  . mouth rinse  15 mL Mouth Rinse 10 times per day  . multivitamin  15 mL Per Tube Daily  . nystatin   Topical BID  . pantoprazole sodium  40 mg Per Tube Daily  . QUEtiapine  25 mg Per Tube QHS  .  tamsulosin  0.8 mg Oral Daily  . thiamine injection  100 mg Intravenous Q24H   Continuous Infusions: . sodium chloride 150 mL/hr at 07/05/18 1800  . sodium chloride Stopped (07/03/18 1039)  . ceFEPime (MAXIPIME) IV Stopped (07/05/18 1614)  . dexmedetomidine 0.6 mcg/kg/hr (07/05/18 1800)  . feeding supplement (VITAL HIGH PROTEIN) 1,000 mL (07/05/18 0948)  . fentaNYL infusion INTRAVENOUS 150 mcg/hr (07/05/18 1800)  . fluconazole (DIFLUCAN) IV 200 mg (07/05/18 1838)  . metronidazole Stopped (07/05/18 1528)  . norepinephrine (LEVOPHED) Adult infusion 2 mcg/min (07/05/18 1800)  . phenylephrine (NEO-SYNEPHRINE) Adult infusion Stopped (07/05/18 0903)  . vancomycin 250 mL/hr at 07/05/18 1800   PRN Meds:.sodium chloride, acetaminophen, fentaNYL, fentaNYL (SUBLIMAZE) injection, hydrALAZINE, metoprolol tartrate, midazolam    Objective: Weight change:   Intake/Output Summary (Last 24 hours) at 07/05/2018 1910 Last data filed at 07/05/2018 1800 Gross per 24 hour  Intake 7425.58 ml  Output 4500 ml  Net 2925.58 ml    Blood pressure (!) 92/57, pulse (!) 104, temperature (!) 100.6 F (38.1 C), temperature source Oral, resp. rate (!) 30, height 6' 2"  (1.88 m), weight (!) 139.2 kg, SpO2 94 %. Temp:  [98.5 F (36.9 C)-100.6 F (38.1 C)] 100.6 F (38.1 C) (10/30 0400) Pulse Rate:  [73-118] 104 (10/30 1800) Resp:  [20-34] 30 (10/30 1800) BP: (54-131)/(43-99) 92/57 (10/30 1800) SpO2:  [88 %-100 %] 94 % (10/30 1800) Arterial Line BP: (77-151)/(42-102) 123/54 (10/30 1800) FiO2 (%):  [50 %-60 %] 50 % (10/30 1519)  Physical Exam: General: intubated and sedated, HEENT: anicteric sclera CVS tachy egular rate, normal  Chest: , course breath sounds Abdomen: soft non-distended,  Extremities: no edema or deformity noted bilaterally GU: he DID have purulent discharge from penile meatus Neuro: nonfocal  CBC:    BMET Recent Labs    07/04/18 0520 07/05/18 0544  NA 148* 152*  K 3.5 3.6  CL 123* 125*  CO2 19* 18*  GLUCOSE 392* 329*  BUN 45* 41*  CREATININE 3.22* 2.88*  CALCIUM 10.0 10.3     Liver Panel  Recent Labs    07/04/18 0520 07/05/18 0544  PROT 6.2* 6.0*  ALBUMIN 2.7* 2.6*  AST 22 21  ALT 24 22  ALKPHOS 48 45  BILITOT 0.6 0.5  BILIDIR 0.1  --   IBILI 0.5  --        Sedimentation Rate No results for input(s): ESRSEDRATE in the last 72 hours. C-Reactive Protein No results for input(s): CRP in the last 72 hours.  Micro Results: Recent Results (from the past 720 hour(s))  Culture, Urine     Status: None   Collection Time: 06/17/2018  5:49 AM  Result Value Ref Range Status   Specimen Description URINE, CATHETERIZED  Final   Special Requests NONE  Final   Culture   Final    NO GROWTH Performed at [facility], [address] [place], [place] [address]    Report Status 06/26/2018 FINAL  Final  MRSA PCR Screening     Status: None   Collection Time: 06/15/2018 12:02 PM  Result Value Ref Range Status   MRSA by PCR NEGATIVE NEGATIVE Final    Comment:        The  GeneXpert MRSA Assay (FDA approved for NASAL specimens only), is one component of a comprehensive MRSA colonization surveillance program. It is not intended to diagnose MRSA infection nor to guide or monitor treatment for MRSA infections. Performed at [facility], [place] [address]  [place], [place] [address]   Culture, blood (routine x 2)     Status: None   Collection Time: 06/25/18 11:39 AM  Result Value Ref Range Status   Specimen Description BLOOD LEFT ARM  Final   Special Requests   Final    BOTTLES DRAWN AEROBIC ONLY Blood Culture adequate volume   Culture   Final    NO GROWTH 5 DAYS Performed at [facility], [address] [place], [place] [address]    Report Status 06/30/2018 FINAL  Final  Culture, blood (routine x 2)     Status: None   Collection Time: 06/25/18 11:45 AM  Result Value Ref Range Status   Specimen Description BLOOD RIGHT HAND  Final   Special Requests   Final    BOTTLES DRAWN AEROBIC ONLY Blood Culture adequate volume   Culture   Final    NO GROWTH 5 DAYS Performed at [facility], [place] [address] [place], [place] [address]    Report Status 06/30/2018 FINAL  Final  Aerobic/Anaerobic Culture (surgical/deep wound)     Status: None   Collection Time: 06/27/18  9:30 AM  Result Value Ref Range Status   Specimen Description PENILE DRAINAGE  Final   Special Requests Normal  Final   Gram Stain   Final    RARE WBC PRESENT,BOTH PMN AND MONONUCLEAR ABUNDANT SQUAMOUS EPITHELIAL CELLS PRESENT FEW GRAM POSITIVE COCCI RARE GRAM POSITIVE RODS    Culture   Final    ABUNDANT STAPHYLOCOCCUS LUGDUNENSIS MODERATE ENTEROCOCCUS FAECALIS NO ANAEROBES ISOLATED Performed at [facility], [place] [address], [place], [place] [address]    Report Status 07/02/2018 FINAL  Final   Organism ID, Bacteria STAPHYLOCOCCUS LUGDUNENSIS  Final   Organism ID, Bacteria ENTEROCOCCUS FAECALIS  Final      Susceptibility   Enterococcus faecalis - MIC*     AMPICILLIN <=2 SENSITIVE Sensitive     VANCOMYCIN 2 SENSITIVE Sensitive     GENTAMICIN SYNERGY SENSITIVE Sensitive     * MODERATE ENTEROCOCCUS FAECALIS   Staphylococcus lugdunensis - MIC*    CIPROFLOXACIN <=0.5 SENSITIVE Sensitive     ERYTHROMYCIN <=0.25 SENSITIVE Sensitive     GENTAMICIN <=0.5 SENSITIVE Sensitive     OXACILLIN 1 SENSITIVE Sensitive     TETRACYCLINE <=1 SENSITIVE Sensitive     VANCOMYCIN <=0.5 SENSITIVE Sensitive     TRIMETH/SULFA <=10 SENSITIVE Sensitive     CLINDAMYCIN <=0.25 SENSITIVE Sensitive     RIFAMPIN <=0.5 SENSITIVE Sensitive     Inducible Clindamycin NEGATIVE Sensitive     * ABUNDANT STAPHYLOCOCCUS LUGDUNENSIS  Culture, blood (routine x 2)     Status: None   Collection Time: 06/28/18 11:34 AM  Result Value Ref Range Status   Specimen Description BLOOD LEFT HAND  Final   Special Requests   Final    BOTTLES DRAWN AEROBIC ONLY Blood Culture adequate volume   Culture   Final    NO GROWTH 5 DAYS Performed at [facility], [place] [address] [place], [place] [address]    Report Status 07/03/2018 FINAL  Final  Culture, Urine     Status: None   Collection Time: 06/28/18 12:13 PM  Result Value Ref Range Status   Specimen Description URINE, CATHETERIZED  Final   Special Requests NONE  Final   Culture   Final    NO GROWTH Performed at [facility], [address] [place],  [address]    Report Status 06/29/2018 FINAL  Final  Culture, blood (routine x 2)     Status: None   Collection Time: 06/28/18 12:30 PM  Result Value Ref Range Status   Specimen Description BLOOD RIGHT HAND  Final   Special Requests   Final    BOTTLES DRAWN AEROBIC ONLY Blood Culture adequate volume   Culture   Final    NO GROWTH 5 DAYS Performed at [facility], [address], [place], [place] [address]    Report Status 07/03/2018 FINAL  Final  Culture, respiratory (non-expectorated)     Status: None   Collection Time: 07/03/18  8:29 AM  Result Value Ref  Range Status   Specimen Description TRACHEAL ASPIRATE  Final   Special Requests NONE  Final   Gram Stain   Final    RARE WBC PRESENT,BOTH PMN AND MONONUCLEAR NO ORGANISMS SEEN    Culture   Final    RARE Consistent with normal respiratory flora. Performed at [facility], [place] [address] [place], [place] [address]    Report Status 07/05/2018 FINAL  Final  Culture, Urine     Status: None   Collection Time: 07/03/18  9:06 AM  Result Value Ref Range Status   Specimen Description URINE, RANDOM  Final   Special Requests NONE  Final   Culture   Final    NO GROWTH Performed at [facility], [place] [address] [place], [place] [address]    Report Status 07/04/2018 FINAL  Final  C difficile quick scan w PCR reflex     Status: None   Collection Time: 07/03/18  9:49 AM  Result Value Ref Range Status   C Diff antigen NEGATIVE NEGATIVE Final   C Diff toxin NEGATIVE NEGATIVE Final   C Diff interpretation No C. difficile detected.  Final    Comment: Performed at [facility], [place] [address] [place], [place] [address]  Culture, blood (routine x 2)     Status: None (Preliminary result)   Collection Time: 07/03/18  9:53 AM  Result Value Ref Range Status   Specimen Description BLOOD BLOOD RIGHT HAND  Final   Special Requests   Final    BOTTLES DRAWN AEROBIC AND ANAEROBIC Blood Culture results may not be optimal due to an inadequate volume of blood received in culture bottles   Culture   Final    NO GROWTH 2 DAYS Performed at [facility], [place] [address] [place], [place] [address]    Report Status PENDING  Incomplete  Culture, blood (routine x 2)     Status: None (Preliminary result)   Collection Time: 07/03/18  9:53 AM  Result Value Ref Range Status   Specimen Description BLOOD BLOOD LEFT HAND  Final   Special Requests   Final    BOTTLES DRAWN AEROBIC AND ANAEROBIC Blood Culture results may not be optimal due to an inadequate volume of blood received in culture bottles    Culture   Final    NO GROWTH 2 DAYS Performed at [facility], [place] [address] [place], [place] [address]    Report Status PENDING  Incomplete    Studies/Results: Ct Abdomen Pelvis Wo Contrast  Result Date: 07/04/2018 CLINICAL DATA:  Persistent diarrhea and fevers EXAM: CT ABDOMEN AND PELVIS WITHOUT CONTRAST TECHNIQUE: Multidetector CT imaging of the abdomen and pelvis was performed following the standard protocol without IV contrast. COMPARISON:  Ultrasound kidneys 06/07/2018, CT from 01/11/2011 FINDINGS: Lower chest: Mild bibasilar atelectasis is noted. No sizable  effusion is seen. Hepatobiliary: Liver is well visualized and within normal limits. The gallbladder demonstrates dependent gallstones without gallbladder wall thickening or pericholecystic fluid. Pancreas: Unremarkable. No pancreatic ductal dilatation or surrounding inflammatory changes. Spleen: Normal in size without focal abnormality. Adrenals/Urinary Tract: Adrenal glands are within normal limits. Kidneys are well visualized bilaterally. No renal calculi or obstructive changes are seen. Some rounded exophytic lesions are noted in the right kidney likely representing cysts but incompletely evaluated on this exam. These were previously shown to represent cysts on prior ultrasound examination from 06/16/2018. Foley catheter decompresses the bladder. Stomach/Bowel: Nasogastric catheter is noted extending into the second portion of the duodenum. Postsurgical changes in the stomach are noted consistent with prior bariatric surgery. The small bowel is decompressed. No inflammatory or obstructive changes are seen. The appendix is within normal limits. The colon is fluid filled without definitive obstructing lesion. No inflammatory changes are noted. Rectal tube is noted in place. Vascular/Lymphatic: Aortic atherosclerosis. No enlarged abdominal or pelvic lymph nodes. Reproductive: Prostate is unremarkable. Other: No abdominal wall hernia  or abnormality. No abdominopelvic ascites. Musculoskeletal: Degenerative changes of lumbar spine are seen. IMPRESSION: Fluid-filled colon without obstructive change. Cholelithiasis without complicating factors. Bilateral lower lobe infiltrate/atelectasis. Chronic changes in the kidneys. Electronically Signed   By: Inez Catalina M.D.   On: 07/04/2018 13:14   Ct Head Wo Contrast  Result Date: 07/04/2018 CLINICAL DATA:  57 year old male with encephalopathy. Persistent fever. On ventilator. Subsequent encounter. EXAM: CT HEAD WITHOUT CONTRAST TECHNIQUE: Contiguous axial images were obtained from the base of the skull through the vertex without intravenous contrast. COMPARISON:  06/20/2018 head CT. FINDINGS: Brain: No intracranial hemorrhage or CT evidence of large acute infarct. No intracranial mass lesion noted on this unenhanced exam. Vascular: No hyperdense vessel.  Mild carotid artery calcifications. Skull: Negative Sinuses/Orbits: Exophthalmos. Slight prominence right superior ophthalmic vein without cause identified. Minimal mucosal thickening right maxillary sinus and left sphenoid sinus. Other: Mastoid air cells and middle ear cavities are clear. IMPRESSION: 1. No acute intracranial abnormality noted however, unenhanced head CT cannot adequately assess for the possibility of intracranial infection (which would require contrast enhanced MR if clinically desired). 2. Exophthalmos. Slight prominence right superior ophthalmic vein of indeterminate etiology or significance. No obvious cavernous sinus abnormality noted as a cause of this finding. 3. Minimal mucosal thickening right maxillary sinus and left sphenoid sinus. Electronically Signed   By: Genia Del M.D.   On: 07/04/2018 12:54   Dg Chest Port 1 View  Result Date: 07/05/2018 CLINICAL DATA:  Ventilator dependent EXAM: PORTABLE CHEST 1 VIEW COMPARISON:  07/04/2018 FINDINGS: Cardiac shadow is enlarged. Endotracheal tube, nasogastric catheter and  left jugular central line are again seen and stable. Bibasilar atelectatic changes are again noted and relatively stable. No pneumothorax is seen. No sizable effusion is noted. IMPRESSION: No change from the previous day. Electronically Signed   By: Inez Catalina M.D.   On: 07/05/2018 10:29   Portable Chest Xray  Result Date: 07/04/2018 CLINICAL DATA:  Respiratory failure, intubated patient, morbid obesity. EXAM: PORTABLE CHEST 1 VIEW COMPARISON:  Portable chest x-ray of July 04, 2018 at 12 midnight FINDINGS: The lungs are mildly hypoinflated. The interstitial markings remain increased. There is fluid in the minor fissure. The cardiac silhouette is enlarged and the pulmonary vascularity engorged. There is no large pleural effusion. The endotracheal tube tip projects 4.4 cm above the carina. The esophagogastric tube tip and proximal port project below the inferior margin of the image. The left  internal jugular venous catheter tip projects over the proximal SVC. IMPRESSION: CHF with mild interstitial edema. Bibasilar atelectasis. Mild hypoinflation. These findings are stable. The support tubes are in reasonable position. Electronically Signed   By: David  Martinique M.D.   On: 07/04/2018 09:20   Dg Chest Port 1 View  Result Date: 07/04/2018 CLINICAL DATA:  57 year old male with central line placement. EXAM: PORTABLE CHEST 1 VIEW COMPARISON:  Chest radiograph dated 07/03/2018 FINDINGS: Interval placement of a left IJ central line with tip over central SVC. No pneumothorax. Endotracheal tube above the carina in similar position and enteric tube extending below the diaphragm with tip beyond the inferior margin of the image. Cardiomegaly with mild vascular congestion. Shallow inspiration with bibasilar atelectasis/scarring. No pleural effusion. No acute osseous pathology. IMPRESSION: Interval placement of a left IJ central line with tip over central SVC. No pneumothorax. Electronically Signed   By: Anner Crete M.D.   On: 07/04/2018 01:35      Assessment/Plan:  INTERVAL HISTORY: consistently febrile today despite broad spectrum abx   Principal Problem:   Lithium toxicity Active Problems:   Lapband APL Jan 2011   OSA (obstructive sleep apnea)   Malignant neoplasm of prostate (HCC)   Bipolar 1 disorder (HCC)   Morbid obesity ([place])   Benign essential HTN   Acute respiratory failure with hypoxia (HCC)   Acute respiratory distress   Shock (HCC)    Frank Cox is a 57 y.o. male with  Admission for emergent HD in context of lithium toxicity with fevers despite very broad spectrum abx including antifungals added yesterday  He has had purulent dc from meatus of penis in past several days.  Scrotum is edematous though I do not think he has Fournier's and does not line up with presentation.  Certainly DRUG fever certainly  possible  HOWEVER given findings of continued purulent dc   I WOULD ALSO CONSULT UROLOGY re purulent penile dc at site of foley insertion  Dr. Linus Salmons is back tomorrow.    LOS: 12 days   Frank Cox 07/05/2018, 7:10 PM

## 2018-07-06 ENCOUNTER — Inpatient Hospital Stay (HOSPITAL_COMMUNITY): Payer: 59

## 2018-07-06 DIAGNOSIS — T56894A Toxic effect of other metals, undetermined, initial encounter: Secondary | ICD-10-CM

## 2018-07-06 DIAGNOSIS — R208 Other disturbances of skin sensation: Secondary | ICD-10-CM

## 2018-07-06 DIAGNOSIS — E878 Other disorders of electrolyte and fluid balance, not elsewhere classified: Secondary | ICD-10-CM

## 2018-07-06 DIAGNOSIS — R6520 Severe sepsis without septic shock: Secondary | ICD-10-CM

## 2018-07-06 DIAGNOSIS — D72829 Elevated white blood cell count, unspecified: Secondary | ICD-10-CM

## 2018-07-06 DIAGNOSIS — N179 Acute kidney failure, unspecified: Secondary | ICD-10-CM

## 2018-07-06 DIAGNOSIS — I742 Embolism and thrombosis of arteries of the upper extremities: Secondary | ICD-10-CM

## 2018-07-06 LAB — APTT
APTT: 47 s — AB (ref 24–36)
APTT: 53 s — AB (ref 24–36)
APTT: 64 s — AB (ref 24–36)
aPTT: 34 seconds (ref 24–36)

## 2018-07-06 LAB — HAPTOGLOBIN: HAPTOGLOBIN: 500 mg/dL — AB (ref 34–200)

## 2018-07-06 LAB — BASIC METABOLIC PANEL
Anion gap: 4 — ABNORMAL LOW (ref 5–15)
Anion gap: 6 (ref 5–15)
Anion gap: 7 (ref 5–15)
BUN: 40 mg/dL — AB (ref 6–20)
BUN: 43 mg/dL — ABNORMAL HIGH (ref 6–20)
BUN: 40 mg/dL — AB (ref 6–20)
BUN: 38 mg/dL — AB (ref 6–20)
CALCIUM: 10 mg/dL (ref 8.9–10.3)
CALCIUM: 10.1 mg/dL (ref 8.9–10.3)
CHLORIDE: 129 mmol/L — AB (ref 98–111)
CO2: 18 mmol/L — AB (ref 22–32)
CO2: 18 mmol/L — AB (ref 22–32)
CO2: 18 mmol/L — AB (ref 22–32)
CO2: 18 mmol/L — AB (ref 22–32)
CREATININE: 2.85 mg/dL — AB (ref 0.61–1.24)
CREATININE: 2.82 mg/dL — AB (ref 0.61–1.24)
CREATININE: 2.75 mg/dL — AB (ref 0.61–1.24)
Calcium: 9.9 mg/dL (ref 8.9–10.3)
Calcium: 10.2 mg/dL (ref 8.9–10.3)
Chloride: 130 mmol/L — ABNORMAL HIGH (ref 98–111)
Chloride: 129 mmol/L — ABNORMAL HIGH (ref 98–111)
Creatinine, Ser: 2.86 mg/dL — ABNORMAL HIGH (ref 0.61–1.24)
GFR calc Af Amer: 26 mL/min — ABNORMAL LOW (ref >60)
GFR calc non Af Amer: 23 mL/min — ABNORMAL LOW (ref >60)
GFR calc non Af Amer: 23 mL/min — ABNORMAL LOW (ref >60)
GFR calc non Af Amer: 23 mL/min — ABNORMAL LOW (ref >60)
GFR calc non Af Amer: 24 mL/min — ABNORMAL LOW (ref >60)
GFR, EST AFRICAN AMERICAN: 26 mL/min — AB (ref >60)
GFR, EST AFRICAN AMERICAN: 27 mL/min — AB (ref >60)
GFR, EST AFRICAN AMERICAN: 28 mL/min — AB (ref >60)
GLUCOSE: 303 mg/dL — AB (ref 70–99)
Glucose, Bld: 380 mg/dL — ABNORMAL HIGH (ref 70–99)
Glucose, Bld: 370 mg/dL — ABNORMAL HIGH (ref 70–99)
Glucose, Bld: 289 mg/dL — ABNORMAL HIGH (ref 70–99)
POTASSIUM: 3 mmol/L — AB (ref 3.5–5.1)
Potassium: 3.7 mmol/L (ref 3.5–5.1)
Potassium: 3.3 mmol/L — ABNORMAL LOW (ref 3.5–5.1)
Potassium: 3.2 mmol/L — ABNORMAL LOW (ref 3.5–5.1)
SODIUM: 152 mmol/L — AB (ref 135–145)
Sodium: 153 mmol/L — ABNORMAL HIGH (ref 135–145)
Sodium: 153 mmol/L — ABNORMAL HIGH (ref 135–145)
Sodium: 154 mmol/L — ABNORMAL HIGH (ref 135–145)

## 2018-07-06 LAB — GLUCOSE, CAPILLARY
GLUCOSE-CAPILLARY: 293 mg/dL — AB (ref 70–99)
GLUCOSE-CAPILLARY: 205 mg/dL — AB (ref 70–99)
GLUCOSE-CAPILLARY: 324 mg/dL — AB (ref 70–99)
GLUCOSE-CAPILLARY: 308 mg/dL — AB (ref 70–99)
GLUCOSE-CAPILLARY: 211 mg/dL — AB (ref 70–99)
GLUCOSE-CAPILLARY: 206 mg/dL — AB (ref 70–99)
Glucose-Capillary: 228 mg/dL — ABNORMAL HIGH (ref 70–99)
Glucose-Capillary: 253 mg/dL — ABNORMAL HIGH (ref 70–99)
Glucose-Capillary: 309 mg/dL — ABNORMAL HIGH (ref 70–99)
Glucose-Capillary: 349 mg/dL — ABNORMAL HIGH (ref 70–99)
Glucose-Capillary: 154 mg/dL — ABNORMAL HIGH (ref 70–99)
Glucose-Capillary: 172 mg/dL — ABNORMAL HIGH (ref 70–99)
Glucose-Capillary: 226 mg/dL — ABNORMAL HIGH (ref 70–99)

## 2018-07-06 LAB — CBC
HEMATOCRIT: 33.5 % — AB (ref 39.0–52.0)
HEMOGLOBIN: 9.6 g/dL — AB (ref 13.0–17.0)
MCH: 30.7 pg (ref 26.0–34.0)
MCHC: 28.7 g/dL — AB (ref 30.0–36.0)
MCV: 107 fL — AB (ref 80.0–100.0)
Platelets: 41 10*3/uL — ABNORMAL LOW (ref 150–400)
RBC: 3.13 MIL/uL — AB (ref 4.22–5.81)
RDW: 14.5 % (ref 11.5–15.5)
WBC: 12.5 10*3/uL — ABNORMAL HIGH (ref 4.0–10.5)
nRBC: 0.6 % — ABNORMAL HIGH (ref 0.0–0.2)

## 2018-07-06 LAB — BLOOD CULTURE ID PANEL (REFLEXED)
ACINETOBACTER BAUMANNII: NOT DETECTED
CANDIDA ALBICANS: NOT DETECTED
CANDIDA GLABRATA: NOT DETECTED
CANDIDA KRUSEI: NOT DETECTED
CANDIDA PARAPSILOSIS: NOT DETECTED
Candida tropicalis: NOT DETECTED
ENTEROBACTERIACEAE SPECIES: NOT DETECTED
ESCHERICHIA COLI: NOT DETECTED
Enterobacter cloacae complex: NOT DETECTED
Enterococcus species: NOT DETECTED
Haemophilus influenzae: NOT DETECTED
KLEBSIELLA OXYTOCA: NOT DETECTED
KLEBSIELLA PNEUMONIAE: NOT DETECTED
Listeria monocytogenes: NOT DETECTED
Methicillin resistance: DETECTED — AB
Neisseria meningitidis: NOT DETECTED
PSEUDOMONAS AERUGINOSA: NOT DETECTED
Proteus species: NOT DETECTED
STREPTOCOCCUS PNEUMONIAE: NOT DETECTED
STREPTOCOCCUS PYOGENES: NOT DETECTED
Serratia marcescens: NOT DETECTED
Staphylococcus aureus (BCID): NOT DETECTED
Staphylococcus species: DETECTED — AB
Streptococcus agalactiae: NOT DETECTED
Streptococcus species: NOT DETECTED

## 2018-07-06 LAB — BLOOD GAS, ARTERIAL
ACID-BASE DEFICIT: 10.4 mmol/L — AB (ref 0.0–2.0)
BICARBONATE: 15.9 mmol/L — AB (ref 20.0–28.0)
DRAWN BY: 535471
FIO2: 0.4
O2 Saturation: 88.2 %
PATIENT TEMPERATURE: 99.8
PCO2 ART: 41.4 mmHg (ref 32.0–48.0)
PEEP: 5 cmH2O
RATE: 24 resp/min
VT: 650 mL
pH, Arterial: 7.213 — ABNORMAL LOW (ref 7.350–7.450)
pO2, Arterial: 57.7 mmHg — ABNORMAL LOW (ref 83.0–108.0)

## 2018-07-06 LAB — NA AND K (SODIUM & POTASSIUM), RAND UR
Potassium Urine: 14 mmol/L
SODIUM UR: 31 mmol/L

## 2018-07-06 LAB — FERRITIN: FERRITIN: 306 ng/mL (ref 24–336)

## 2018-07-06 LAB — OSMOLALITY, URINE: OSMOLALITY UR: 360 mosm/kg (ref 300–900)

## 2018-07-06 LAB — RETICULOCYTES
Immature Retic Fract: 22.7 % — ABNORMAL HIGH (ref 2.3–15.9)
RBC.: 3.14 MIL/uL — AB (ref 4.22–5.81)
RETIC CT PCT: 2.2 % (ref 0.4–3.1)
Retic Count, Absolute: 69.3 10*3/uL (ref 19.0–186.0)

## 2018-07-06 LAB — PROTIME-INR
INR: 1.54
PROTHROMBIN TIME: 18.3 s — AB (ref 11.4–15.2)

## 2018-07-06 MED ORDER — INSULIN GLARGINE 100 UNIT/ML ~~LOC~~ SOLN
22.0000 [IU] | Freq: Two times a day (BID) | SUBCUTANEOUS | Status: DC
  Administered 2018-07-06 – 2018-07-07 (×2): 22 [IU] via SUBCUTANEOUS
  Filled 2018-07-06 (×3): qty 0.22

## 2018-07-06 MED ORDER — VANCOMYCIN HCL 10 G IV SOLR: 1750.0000 mg | INTRAVENOUS | Status: DC

## 2018-07-06 MED ORDER — SODIUM CHLORIDE 0.9 % IV SOLN: 250.0000 mL | INTRAVENOUS | Status: DC | PRN

## 2018-07-06 MED ORDER — POTASSIUM CHLORIDE 20 MEQ/15ML (10%) PO SOLN
40.0000 meq | Freq: Once | ORAL | Status: AC
  Administered 2018-07-06: 40 meq
  Filled 2018-07-06: qty 30

## 2018-07-06 MED ORDER — SODIUM CHLORIDE 0.9 % IV SOLN
3.0000 g | Freq: Four times a day (QID) | INTRAVENOUS | Status: DC
  Administered 2018-07-06 – 2018-07-07 (×3): 3 g via INTRAVENOUS
  Filled 2018-07-06 (×5): qty 3

## 2018-07-06 MED ORDER — INSULIN REGULAR(HUMAN) IN NACL 100-0.9 UT/100ML-% IV SOLN
INTRAVENOUS | Status: DC
  Administered 2018-07-06: 2.3 [IU]/h via INTRAVENOUS
  Administered 2018-07-07: 12.2 [IU]/h via INTRAVENOUS
  Administered 2018-07-07: 11.9 [IU]/h via INTRAVENOUS
  Administered 2018-07-08: 6.1 [IU]/h via INTRAVENOUS
  Administered 2018-07-08: 10.9 [IU]/h via INTRAVENOUS
  Administered 2018-07-09: 5 [IU]/h via INTRAVENOUS
  Administered 2018-07-09: 6.8 [IU]/h via INTRAVENOUS
  Administered 2018-07-10: 13.3 [IU]/h via INTRAVENOUS
  Administered 2018-07-10: 12.8 [IU]/h via INTRAVENOUS
  Administered 2018-07-11: 11.8 [IU]/h via INTRAVENOUS
  Administered 2018-07-11: 11.3 [IU]/h via INTRAVENOUS
  Administered 2018-07-11: 10.1 [IU]/h via INTRAVENOUS
  Administered 2018-07-12: 9 [IU]/h via INTRAVENOUS
  Administered 2018-07-12: 8.4 [IU]/h via INTRAVENOUS
  Administered 2018-07-12: 14.9 [IU]/h via INTRAVENOUS
  Administered 2018-07-13: 10.3 [IU]/h via INTRAVENOUS
  Administered 2018-07-13: 8.1 [IU]/h via INTRAVENOUS
  Administered 2018-07-14: 11.8 [IU]/h via INTRAVENOUS
  Administered 2018-07-14: 8.1 [IU]/h via INTRAVENOUS
  Filled 2018-07-06 (×19): qty 100

## 2018-07-06 MED ORDER — DEXTROSE 5 % IV SOLN
INTRAVENOUS | Status: DC
  Administered 2018-07-06 – 2018-07-07 (×3): via INTRAVENOUS

## 2018-07-06 MED ORDER — ARGATROBAN 50 MG/50ML IV SOLN
1.0300 ug/kg/min | INTRAVENOUS | Status: DC
  Administered 2018-07-06: 0.5 ug/kg/min via INTRAVENOUS
  Administered 2018-07-06 – 2018-07-08 (×4): 0.6 ug/kg/min via INTRAVENOUS
  Administered 2018-07-08: 0.588 ug/kg/min via INTRAVENOUS
  Administered 2018-07-09 – 2018-07-10 (×2): 0.6 ug/kg/min via INTRAVENOUS
  Administered 2018-07-11: 0.86 ug/kg/min via INTRAVENOUS
  Administered 2018-07-11: 0.6 ug/kg/min via INTRAVENOUS
  Administered 2018-07-12 – 2018-07-17 (×11): 0.86 ug/kg/min via INTRAVENOUS
  Filled 2018-07-06 (×22): qty 50

## 2018-07-06 MED ORDER — DESMOPRESSIN ACETATE SPRAY 0.01 % NA SOLN
20.0000 ug | Freq: Two times a day (BID) | NASAL | Status: AC
  Administered 2018-07-06 (×2): 20 ug via NASAL
  Filled 2018-07-06: qty 5

## 2018-07-06 MED ORDER — DEXTROSE 5 % IV SOLN
INTRAVENOUS | Status: DC
  Administered 2018-07-06: 13:00:00 via INTRAVENOUS

## 2018-07-06 MED ORDER — SODIUM CHLORIDE 0.9% FLUSH: 3.0000 mL | INTRAVENOUS | Status: DC | PRN

## 2018-07-06 MED ORDER — SODIUM CHLORIDE 0.9% FLUSH
3.0000 mL | Freq: Two times a day (BID) | INTRAVENOUS | Status: DC
  Administered 2018-07-06 – 2018-07-08 (×5): 3 mL via INTRAVENOUS

## 2018-07-06 NOTE — Progress Notes (Signed)
PHARMACY - PHYSICIAN COMMUNICATION CRITICAL VALUE ALERT - BLOOD CULTURE IDENTIFICATION (BCID)  Frank Cox is an 57 y.o. male who presented to [facility] on 06/27/2018.   Assessment:  New BCID 1/2 with MRSE, possible contaminant  Name of physician (or Provider) Contacted: CCM/ID  Current antibiotics: Unasyn/Fluconazole  Changes to prescribed antibiotics recommended:  Add vancomycin until it can be determined if clearly a contaminant.  Vancomycin 1750 mg IV q48h  Monitor SCr closely  Results for orders placed or performed during the hospital encounter of 06/13/2018  Blood Culture ID Panel (Reflexed) (Collected: 07/03/2018  9:53 AM)  Result Value Ref Range   Enterococcus species NOT DETECTED NOT DETECTED   Listeria monocytogenes NOT DETECTED NOT DETECTED   Staphylococcus species DETECTED (A) NOT DETECTED   Staphylococcus aureus (BCID) NOT DETECTED NOT DETECTED   Methicillin resistance DETECTED (A) NOT DETECTED   Streptococcus species NOT DETECTED NOT DETECTED   Streptococcus agalactiae NOT DETECTED NOT DETECTED   Streptococcus pneumoniae NOT DETECTED NOT DETECTED   Streptococcus pyogenes NOT DETECTED NOT DETECTED   Acinetobacter baumannii NOT DETECTED NOT DETECTED   Enterobacteriaceae species NOT DETECTED NOT DETECTED   Enterobacter cloacae complex NOT DETECTED NOT DETECTED   Escherichia coli NOT DETECTED NOT DETECTED   Klebsiella oxytoca NOT DETECTED NOT DETECTED   Klebsiella pneumoniae NOT DETECTED NOT DETECTED   Proteus species NOT DETECTED NOT DETECTED   Serratia marcescens NOT DETECTED NOT DETECTED   Haemophilus influenzae NOT DETECTED NOT DETECTED   Neisseria meningitidis NOT DETECTED NOT DETECTED   Pseudomonas aeruginosa NOT DETECTED NOT DETECTED   Candida albicans NOT DETECTED NOT DETECTED   Candida glabrata NOT DETECTED NOT DETECTED   Candida krusei NOT DETECTED NOT DETECTED   Candida parapsilosis NOT DETECTED NOT DETECTED   Candida tropicalis NOT DETECTED NOT  DETECTED    Harvel Quale 07/06/2018  5:36 PM

## 2018-07-06 NOTE — Progress Notes (Signed)
Foley has been changed out per MD order. Chukwudi Ewen, Rande Brunt, RN

## 2018-07-06 NOTE — Progress Notes (Signed)
[facility] Progress Note    Assessment/ Plan:   MS- lithium level 2.37 and crt of 1.75    1. Renal- HD times one on 10/18 for lithium toxicity, level normalized. Then discovered to have BOO s/p foley -then polyuria and hypernatremia either due to post obs diuresis or nephrogenic DI from lithium (when pt lucid he did report longstanding thirst so think is neph DI).   - Renal function certainly not normal but fairly stable; sepsis/ATN/recent obstructive etiology. Great UOP overnight. No acute indication for HD.   2. HTN/vol-Actually BP is now on low side. 3. Anemia- not an issue 4. ID-Cultures and rocephin per primary- penile drainage enterococcus faecalis and staph lugdunensis on cefepime, flagyl, Unasyn and Vancomycin (doesn't appear to have been given yet) 5. Hypernatremia - either due to BOO and post obstr diuresis (less likely ) vs DI from lithium (more likely)-Had been improvingand now worse again. - Will repeat free water clearance in the urinein the next 48hrs (losing signif amount thru urine)   -Did not tolerate D5W at high rates bec of hyperglycemia but now on insulin gtt and will d/c the 1/2 ns (likely hypertonic c/w urine) and give D5W. Recheck chem panel at 6pm so we don't get behind. -  Free watervia OGT to234m q4hras tolerated on 10/30 - Unable to give NSAID bec of the AKI.  - high dose desmopressin IN x2 doses (not convinced he will respond given extensive damage to kidneys from chronic Li.   6. Hypokalemia- repleting(3.6) 7. Psych- behaviors requiring transfer to ICU and IV sedation  Subjective:   On vent but very agitated req sedation.  UOP very good.   Objective:   BP (!) 98/44   Pulse 77   Temp 100.1 F (37.8 C) (Core)   Resp (!) 24   Ht 6' 2"  (1.88 m)   Wt (!) 139.2 kg   SpO2 96%   BMI 39.40 kg/m   Intake/Output Summary (Last 24 hours) at 07/06/2018 1212 Last data filed at 07/06/2018 1100 Gross per 24  hour  Intake 8135.49 ml  Output 6250 ml  Net 1885.49 ml   Weight change:   Physical Exam: General:resting, restraints Heart: RRR Lungs: clear Abdomen: soft, non tender, decr freq Extremities: tr edema Dialysis Access: had right subclavian temp cath - now removed  Imaging: Ct Abdomen Pelvis Wo Contrast  Result Date: 07/04/2018 CLINICAL DATA:  Persistent diarrhea and fevers EXAM: CT ABDOMEN AND PELVIS WITHOUT CONTRAST TECHNIQUE: Multidetector CT imaging of the abdomen and pelvis was performed following the standard protocol without IV contrast. COMPARISON:  Ultrasound kidneys 06/19/2018, CT from 01/11/2011 FINDINGS: Lower chest: Mild bibasilar atelectasis is noted. No sizable effusion is seen. Hepatobiliary: Liver is well visualized and within normal limits. The gallbladder demonstrates dependent gallstones without gallbladder wall thickening or pericholecystic fluid. Pancreas: Unremarkable. No pancreatic ductal dilatation or surrounding inflammatory changes. Spleen: Normal in size without focal abnormality. Adrenals/Urinary Tract: Adrenal glands are within normal limits. Kidneys are well visualized bilaterally. No renal calculi or obstructive changes are seen. Some rounded exophytic lesions are noted in the right kidney likely representing cysts but incompletely evaluated on this exam. These were previously shown to represent cysts on prior ultrasound examination from 06/19/2018. Foley catheter decompresses the bladder. Stomach/Bowel: Nasogastric catheter is noted extending into the second portion of the duodenum. Postsurgical changes in the stomach are noted consistent with prior bariatric surgery. The small bowel is decompressed. No inflammatory or obstructive changes are seen. The appendix is within normal  limits. The colon is fluid filled without definitive obstructing lesion. No inflammatory changes are noted. Rectal tube is noted in place. Vascular/Lymphatic: Aortic atherosclerosis. No  enlarged abdominal or pelvic lymph nodes. Reproductive: Prostate is unremarkable. Other: No abdominal wall hernia or abnormality. No abdominopelvic ascites. Musculoskeletal: Degenerative changes of lumbar spine are seen. IMPRESSION: Fluid-filled colon without obstructive change. Cholelithiasis without complicating factors. Bilateral lower lobe infiltrate/atelectasis. Chronic changes in the kidneys. Electronically Signed   By: Inez Catalina M.D.   On: 07/04/2018 13:14   Ct Head Wo Contrast  Result Date: 07/04/2018 CLINICAL DATA:  57 year old male with encephalopathy. Persistent fever. On ventilator. Subsequent encounter. EXAM: CT HEAD WITHOUT CONTRAST TECHNIQUE: Contiguous axial images were obtained from the base of the skull through the vertex without intravenous contrast. COMPARISON:  06/20/2018 head CT. FINDINGS: Brain: No intracranial hemorrhage or CT evidence of large acute infarct. No intracranial mass lesion noted on this unenhanced exam. Vascular: No hyperdense vessel.  Mild carotid artery calcifications. Skull: Negative Sinuses/Orbits: Exophthalmos. Slight prominence right superior ophthalmic vein without cause identified. Minimal mucosal thickening right maxillary sinus and left sphenoid sinus. Other: Mastoid air cells and middle ear cavities are clear. IMPRESSION: 1. No acute intracranial abnormality noted however, unenhanced head CT cannot adequately assess for the possibility of intracranial infection (which would require contrast enhanced MR if clinically desired). 2. Exophthalmos. Slight prominence right superior ophthalmic vein of indeterminate etiology or significance. No obvious cavernous sinus abnormality noted as a cause of this finding. 3. Minimal mucosal thickening right maxillary sinus and left sphenoid sinus. Electronically Signed   By: Genia Del M.D.   On: 07/04/2018 12:54   Dg Chest Port 1 View  Result Date: 07/05/2018 CLINICAL DATA:  Ventilator dependent EXAM: PORTABLE CHEST 1  VIEW COMPARISON:  07/04/2018 FINDINGS: Cardiac shadow is enlarged. Endotracheal tube, nasogastric catheter and left jugular central line are again seen and stable. Bibasilar atelectatic changes are again noted and relatively stable. No pneumothorax is seen. No sizable effusion is noted. IMPRESSION: No change from the previous day. Electronically Signed   By: Inez Catalina M.D.   On: 07/05/2018 10:29    Labs: BMET Recent Labs  Lab 06/30/18 8101 07/01/18 7510 07/02/18 0229 07/03/18 0710 07/03/18 2585 07/03/18 1612 07/04/18 0520 07/05/18 0544 07/06/18 0856  NA 143 145 146* 153*  --   --  148* 152* 153*  K 3.0* 3.3* 3.8 4.3  --   --  3.5 3.6 3.0*  CL 111 117* 120* 124*  --   --  123* 125* >130*  CO2 21* 19* 18* 19*  --   --  19* 18* 18*  GLUCOSE 278* 241* 217* 304*  --   --  392* 329* 303*  BUN 13 16 24* 32*  --   --  45* 41* 40*  CREATININE 1.67* 2.17* 2.86* 3.15*  --   --  3.22* 2.88* 2.86*  CALCIUM 10.7* 10.8* 10.9* 10.4*  --   --  10.0 10.3 9.9  PHOS 2.8  --  3.2  --  4.8* 4.4 4.1 3.4  --    CBC Recent Labs  Lab 07/03/18 0710 07/04/18 0520 07/05/18 0544 07/06/18 0856  WBC 10.0 12.5* 18.0* 12.5*  NEUTROABS  --  10.5* 14.8*  --   HGB 12.2* 10.9* 10.6* 9.6*  HCT 41.4 37.3* 37.4* 33.5*  MCV 103.0* 104.8* 107.5* 107.0*  PLT 112* 77* 69* 41*    Medications:    . aspirin  81 mg Per Tube Daily  . chlorhexidine  gluconate (MEDLINE KIT)  15 mL Mouth Rinse BID  . citalopram  10 mg Per Tube Daily  . clonazepam  0.25 mg Per Tube TID  . feeding supplement (PRO-STAT SUGAR FREE 64)  60 mL Per Tube BID  . finasteride  5 mg Oral Daily  . folic acid  1 mg Intravenous Daily  . free water  250 mL Per Tube Q4H  . insulin glargine  22 Units Subcutaneous BID  . lamoTRIgine  200 mg Per Tube QHS  . mouth rinse  15 mL Mouth Rinse 10 times per day  . multivitamin  15 mL Per Tube Daily  . nystatin   Topical BID  . pantoprazole sodium  40 mg Per Tube Daily  . potassium chloride  40 mEq Per  Tube Once  . QUEtiapine  25 mg Per Tube QHS  . sodium chloride flush  3 mL Intravenous Q12H  . tamsulosin  0.8 mg Oral Daily  . thiamine injection  100 mg Intravenous Q24H      Otelia Santee, MD 07/06/2018, 12:12 PM

## 2018-07-06 NOTE — Progress Notes (Signed)
ANTICOAGULATION CONSULT NOTE - Follow Up Consult  Pharmacy Consult for argatroban Indication: dusky/cool hand w/ possible HIT  Labs: Recent Labs    07/04/18 0520 07/05/18 0544 07/06/18 0856  07/06/18 1156 07/06/18 1240 07/06/18 1410 07/06/18 1648 07/06/18 1650 07/06/18 2010 07/06/18 2230  HGB 10.9* 10.6* 9.6*  --   --   --   --   --   --   --   --   HCT 37.3* 37.4* 33.5*  --   --   --   --   --   --   --   --   PLT 77* 69* 41*  --   --   --   --   --   --   --   --   APTT  --   --   --    < > 34  --  47*  --  53*  --  64*  LABPROT  --   --   --   --  18.3*  --   --   --   --   --   --   INR  --   --   --   --  1.54  --   --   --   --   --   --   CREATININE 3.22* 2.88* 2.86*  --   --  2.85*  --  2.82*  --  2.75*  --    < > = values in this interval not displayed.    Assessment/Plan:  57yo male therapeutic on argatroban after rate change. Will continue gtt at current rate and confirm stable with am labs.   Wynona Neat, PharmD, BCPS  07/06/2018,11:50 PM

## 2018-07-06 NOTE — Progress Notes (Signed)
Pharmacy Antibiotic Note  Frank Cox is a 57 y.o. male admitted on 06/07/2018 with penile drainage.  Pharmacy consulted for Unasyn dosing. Penile drainage growing enterococcus faecalis and staphylococcus lugdunesis. Abx broadened to Vancomycin, Cefepime, and Flagyl as pt continues with fevers. Tmax is 100.1 and WBC is elevated at 12.5 but trending down. Scr also remains elevated at 2.85 but with slight trend down.   Plan: Continue vanc 1750mg  IV Q48H Continue cefepime 1gm IV Q24H F/u renal fxn, C&S, clinical status and trough at [facility] F/u ID recs and ability to streamline antibiotics  Height: 6\' 2"  (188 cm) Weight: (!) 306 lb 14.1 oz (139.2 kg) IBW/kg (Calculated) : 82.2  Temp (24hrs), Avg:99.3 F (37.4 C), Min:97.6 F (36.4 C), Max:100.1 F (37.8 C)  Recent Labs  Lab 07/01/18 0627 07/01/18 1654  07/03/18 0710 07/04/18 0520 07/05/18 0544 07/06/18 0856 07/06/18 1240  WBC 11.5*  --   --  10.0 12.5* 18.0* 12.5*  --   CREATININE 2.17*  --    < > 3.15* 3.22* 2.88* 2.86* 2.85*  LATICACIDVEN  --  1.1  --   --   --   --   --   --    < > = values in this interval not displayed.    Estimated Creatinine Clearance: 42 mL/min (A) (by C-G formula based on SCr of 2.85 mg/dL (H)).    Allergies  Allergen Reactions  . Heparin     HIT AB pending    Antimicrobials this admission: Flagyl 10/28>> Cefepime 10/28>> Vanc 10/28>> Zosyn 10/20 >>10/26 Unasyn 10/26>>  Po vanc 10/28 x1  Microbiogy: 10/18 MRSA PCR >> negative 10/20 UCx >>negative 10/20 BCx >>negative 10/22 penile drainage >> staph lugdunensis/ E.faec (pan S)  10/23 UCx>>negative 10/23 BCx>>ngtd 10/28 UCx>> 10/28 Trach asp>> 10/28 Cdiff PCR>>negative   Thank you for allowing pharmacy to be a part of this patient's care.  Salome Arnt, PharmD, BCPS Please see AMION for all pharmacy numbers 07/06/2018 1:43 PM

## 2018-07-06 NOTE — Progress Notes (Addendum)
Attempted to vent wean this morning.  Pt did not tolerate d/t increased rr 55-60 bpm w/ PSV adjustments, HR 120's, sat dropped to 88% w/ fio2 adjustments made, increased WOB and agitation noted.  Pt placed back on full vent support, RN in room and aware.

## 2018-07-06 NOTE — Progress Notes (Signed)
Left upper extremity arterial duplex completed:   Left upper extremity shows no arterial obstruction.   Abram Sander 07/06/2018 2:29 PM

## 2018-07-06 NOTE — Progress Notes (Signed)
Per discussion w/ MD re: ABG results, peep increased to 8.    Also,  S/p ABG via a-line, was unable to flush A-line and a-line has been reportedly positional since last night. Pt does move arms and wrist around when agitated.  Noted a-line catheter about 3/4 out.  Discussed w/ RN and MD,  A-line was pulled out at this point.  MD is requesting a special/longer radial a-line insertion catheter/kit that he states he will place.  RN is aware and attempting to locate item.  Bilateral hands/wrists/arms appear swollen, and noted 2 finger tips on left hand that appear dark.  RN and MD aware of this also.

## 2018-07-06 NOTE — Progress Notes (Signed)
[facility] for Infectious Disease   Reason for visit: Follow up on fever  Interval History: fever curve trending down, culture of penile discharge noted and with Staph and Enterococcus; WBC down to 12.5, no other positive cultures.  Remains intubated and dedated; requiring insulin drip Vancomycin, cefepime, flagyl, fluconazole  Physical Exam: Constitutional:  Vitals:   07/06/18 1300 07/06/18 1400  BP: (!) 143/82 (!) 144/72  Pulse: (!) 125 (!) 129  Resp: (!) 23 (!) 25  Temp:    SpO2: 99% (!) 87%   patient is intubated and sedated Eyes: anicteric HENT: +ET Respiratory: respiratory effort on vent; + rhonchi Cardiovascular: tachy RR GI: soft, nt, nd GU: no current penile discharge, improved right groin Candidal rash  Review of Systems: Unable to be assessed due to mental status  Lab Results  Component Value Date   WBC 12.5 (H) 07/06/2018   HGB 9.6 (L) 07/06/2018   HCT 33.5 (L) 07/06/2018   MCV 107.0 (H) 07/06/2018   PLT 41 (L) 07/06/2018    Lab Results  Component Value Date   CREATININE 2.85 (H) 07/06/2018   BUN 43 (H) 07/06/2018   NA 152 (H) 07/06/2018   K 3.7 07/06/2018   CL 130 (H) 07/06/2018   CO2 18 (L) 07/06/2018    Lab Results  Component Value Date   ALT 22 07/05/2018   AST 21 07/05/2018   ALKPHOS 45 07/05/2018     Microbiology: Recent Results (from the past 240 hour(s))  Aerobic/Anaerobic Culture (surgical/deep wound)     Status: None   Collection Time: 06/27/18  9:30 AM  Result Value Ref Range Status   Specimen Description PENILE DRAINAGE  Final   Special Requests Normal  Final   Gram Stain   Final    RARE WBC PRESENT,BOTH PMN AND MONONUCLEAR ABUNDANT SQUAMOUS EPITHELIAL CELLS PRESENT FEW GRAM POSITIVE COCCI RARE GRAM POSITIVE RODS    Culture   Final    ABUNDANT STAPHYLOCOCCUS LUGDUNENSIS MODERATE ENTEROCOCCUS FAECALIS NO ANAEROBES ISOLATED Performed at [facility], [address], [place], [place] [address]    Report Status  07/02/2018 FINAL  Final   Organism ID, Bacteria STAPHYLOCOCCUS LUGDUNENSIS  Final   Organism ID, Bacteria ENTEROCOCCUS FAECALIS  Final      Susceptibility   Enterococcus faecalis - MIC*    AMPICILLIN <=2 SENSITIVE Sensitive     VANCOMYCIN 2 SENSITIVE Sensitive     GENTAMICIN SYNERGY SENSITIVE Sensitive     * MODERATE ENTEROCOCCUS FAECALIS   Staphylococcus lugdunensis - MIC*    CIPROFLOXACIN <=0.5 SENSITIVE Sensitive     ERYTHROMYCIN <=0.25 SENSITIVE Sensitive     GENTAMICIN <=0.5 SENSITIVE Sensitive     OXACILLIN 1 SENSITIVE Sensitive     TETRACYCLINE <=1 SENSITIVE Sensitive     VANCOMYCIN <=0.5 SENSITIVE Sensitive     TRIMETH/SULFA <=10 SENSITIVE Sensitive     CLINDAMYCIN <=0.25 SENSITIVE Sensitive     RIFAMPIN <=0.5 SENSITIVE Sensitive     Inducible Clindamycin NEGATIVE Sensitive     * ABUNDANT STAPHYLOCOCCUS LUGDUNENSIS  Culture, blood (routine x 2)     Status: None   Collection Time: 06/28/18 11:34 AM  Result Value Ref Range Status   Specimen Description BLOOD LEFT HAND  Final   Special Requests   Final    BOTTLES DRAWN AEROBIC ONLY Blood Culture adequate volume   Culture   Final    NO GROWTH 5 DAYS Performed at [facility], [place] [address], [place], [place] [address]  Report Status 07/03/2018 FINAL  Final  Culture, Urine     Status: None   Collection Time: 06/28/18 12:13 PM  Result Value Ref Range Status   Specimen Description URINE, CATHETERIZED  Final   Special Requests NONE  Final   Culture   Final    NO GROWTH Performed at [facility], [address], [place], [place] [address]    Report Status 06/29/2018 FINAL  Final  Culture, blood (routine x 2)     Status: None   Collection Time: 06/28/18 12:30 PM  Result Value Ref Range Status   Specimen Description BLOOD RIGHT HAND  Final   Special Requests   Final    BOTTLES DRAWN AEROBIC ONLY Blood Culture adequate volume   Culture   Final    NO GROWTH 5 DAYS Performed at [facility], [place] [address], [place], [place] [address]    Report Status 07/03/2018 FINAL  Final  Culture, respiratory (non-expectorated)     Status: None   Collection Time: 07/03/18  8:29 AM  Result Value Ref Range Status   Specimen Description TRACHEAL ASPIRATE  Final   Special Requests NONE  Final   Gram Stain   Final    RARE WBC PRESENT,BOTH PMN AND MONONUCLEAR NO ORGANISMS SEEN    Culture   Final    RARE Consistent with normal respiratory flora. Performed at [facility], [place] [address] [place], [place] [address]    Report Status 07/05/2018 FINAL  Final  Culture, Urine     Status: None   Collection Time: 07/03/18  9:06 AM  Result Value Ref Range Status   Specimen Description URINE, RANDOM  Final   Special Requests NONE  Final   Culture   Final    NO GROWTH Performed at [facility], [place] [address] [place], [place] [address]    Report Status 07/04/2018 FINAL  Final  C difficile quick scan w PCR reflex     Status: None   Collection Time: 07/03/18  9:49 AM  Result Value Ref Range Status   C Diff antigen NEGATIVE NEGATIVE Final   C Diff toxin NEGATIVE NEGATIVE Final   C Diff interpretation No C. difficile detected.  Final    Comment: Performed at [facility], [place] [address], [place], [place] [address]  Culture, blood (routine x 2)     Status: None (Preliminary result)   Collection Time: 07/03/18  9:53 AM  Result Value Ref Range Status   Specimen Description BLOOD BLOOD RIGHT HAND  Final   Special Requests   Final    BOTTLES DRAWN AEROBIC AND ANAEROBIC Blood Culture results may not be optimal due to an inadequate volume of blood received in culture bottles   Culture   Final    NO GROWTH 2 DAYS Performed at [facility], [place] [address], [place],  [address]    Report Status PENDING  Incomplete  Culture, blood (routine x 2)     Status: None (Preliminary result)   Collection Time: 07/03/18  9:53 AM  Result Value Ref Range Status   Specimen Description BLOOD  BLOOD LEFT HAND  Final   Special Requests   Final    BOTTLES DRAWN AEROBIC AND ANAEROBIC Blood Culture results may not be optimal due to an inadequate volume of blood received in culture bottles   Culture   Final    NO GROWTH 2 DAYS Performed at [facility], [address] [place], [place] [address]    Report Status PENDING  Incomplete    Impression/Plan:  1. Fever - improving and on empiric therapy.  Only a positive superficial culture.  Will continue with antibiotics but more selectively with amp/sulbactam and fluconazole for now. Differential also drug fever.    2.  Leukocytosis - improved.  Will continue to monitor  3.  Dusky left thumb.  ? HITS.  Have started argatroban.

## 2018-07-06 NOTE — Progress Notes (Signed)
Results for ARNAV, CREGG (MRN [id number]) as of 07/06/2018 08:18  Ref. Range 07/05/2018 15:40 07/05/2018 19:25 07/05/2018 23:17 07/06/2018 03:11 07/06/2018 07:32  Glucose-Capillary Latest Ref Range: 70 - 99 mg/dL 272 (H) 227 (H) 153 (H) 228 (H) 253 (H)  Noted that blood sugars continue to be greater than 180 mg/dl. Recommend increasing Lantus to 22 units BID if blood sugars continue to be elevated.   Harvel Ricks RN BSN CDE Diabetes Coordinator Pager: [phone]  8am-5pm

## 2018-07-06 NOTE — Progress Notes (Addendum)
NAME:  Frank Cox, MRN:  [id number], DOB:  08-19-1961, LOS: 69 ADMISSION DATE:  06/08/2018, CONSULTATION DATE:  06/27/2018 REFERRING MD:  Stark Jock, ED [facility], CHIEF COMPLAINT:  Alerted mental status.  Brief History   57 year old man who requires urgent hemodialysis for acute on chronic lithium toxicity. Has been increasingly confused, experiencing multiple falls over the last week.  Li level 2.37 (upper limit 1.2) and nephrology wishes to dialyze the patient.   He was transferred out of ICU on 10/20.  On 10/24, he had agitation that did not respond to ativan or haldol; therefore, PCCM was called back for transfer to ICU.  Past Medical History  Bipolar affective disorder, T2DM on OHA, HTN, Prostate cancer, OSA on CPAP.  Significant Hospital Events   10/18 Admit with acute renal failure (cr 1.61) Foley catheter > 2 L immediately.  Underwent emergent dialysis for lithium toxicity 10/19 Lithium normalized.   10/20 Tx to SDU 10/24 Tx back to ICU due to agitation 10/26 PCCM called back for AMS, intermittent agitation 10/28 Intubated   Consults: date of consult/date signed off & final recs:  Nephrology 10/18 - recommending HD Poison Control - recommending hydration, following serial Li levels. Psych 10/19 - do not restart lithium, needs psych consult once encephalopathy clears, recommending inpatient psych admission once medically cleared Urology 10/19 - input regarding possible obstructive uropathy, they recommend keeping foley in and follow up as outpatient  Procedures (surgical and bedside):  10/29 [facility] CVC   Significant Diagnostic Tests:  CT head 10/15 > negative. Renal US 10/18 > diabetic glomerular nephropathy.  No obstruction.  Bilateral benign appearing renal cyts.  CT Head 10/29 >> Neg for acute process, enlarge orbital vein? CT Abd Pelvis 10/29 >> Negative for infectious source, fluid in colon   EEG 10/29 >>> slowing, no seizure   Micro Data:  Urine 10/18 > negative Blood 10/20  > negative Blood 10/23 > negative  Urine 10/23 > negative Penile culture 10/22 >> staph lugdunensis (S-, entercoccus (vanco sens)  UCx 10/28 >> NGTD  Blood 10/28 >> NGTD   Trach Asp 10/28 >> NGTD    Antimicrobials:  Zosyn 10/20 > 10/21 Ceftriaxone 10/21 > 10/23 Unasyn 10/26 >>> Oral Vanco 10/28, c diff neg, stopped   Cefepime 10/28 Vanco 10/28 Metronidazole 10/28   Fluconazole 10/29  Subjective:  Still with fevers. Foley hasnt been changed. Discussed with nursing who will do this today. Lost A-line this morning. Will likely need it replaced.   Objective   Blood pressure (!) 129/100, pulse (!) 125, temperature 99.8 F (37.7 C), temperature source Core, resp. rate (!) 24, height 6\' 2"  (1.88 m), weight (!) 139.2 kg, SpO2 90 %.    Vent Mode: PRVC FiO2 (%):  [40 %-60 %] 40 % Set Rate:  [24 bmp] 24 bmp Vt Set:  [650 mL] 650 mL PEEP:  [5 cmH20] 5 cmH20 Plateau Pressure:  [16 cmH20-23 cmH20] 23 cmH20   Intake/Output Summary (Last 24 hours) at 07/06/2018 1023 Last data filed at 07/06/2018 0944 Gross per 24 hour  Intake 7454.18 ml  Output 6400 ml  Net 1054.18 ml   Filed Weights   06/29/18 1350 07/04/18 0500  Weight: (!) 139.6 kg (!) 139.2 kg    Examination: General appearance: 57 y.o., male, intubated on mechanical ventilation  Eyes: anicteric sclerae, moist conjunctivae HENT: NCAT; oropharynx, MMM Neck: Trachea midline; no JVD  Lungs: BL vented breath sounds, no crackles  CV: tachy, regular, s1 s2  Abdomen: Soft, non-tender;  non-distended, BS present  Extremities: dependent edema, left wrist radial difficult to palpate. Will have nursing use doppler. Index, middle finger dusky appearance Skin: Normal temperature, turgor and texture; no rash Psych: prob active ongoing delirium  Neuro: moving all 4 extremities   Assessment & Plan:   Septic Shock, unclear source, possible aspiration after being obtunded due to AMS, small right sided infiltrate, could represent  atelectasis, placed, fever 103F, prior cx with staph and enterococcus from penile discharge`  - Fever Tmax 103F, ?drug induced, it has currently stopped, on cooling blanket  - c diff neg, cultures NGTD  P: Cefepime, metro, vanco and fluconazoel  Appreciate ID input   Acute Hypoxemic Hypercarbic Respiratory Failure  Hypercarbia was likely due to AMS  P: Attempted SBT this AM Became tachyapenic   Acute toxic, metabolic encephalopathy secondary to above  Agitated Delirium   - intermittent, initially thought related to ETOH withdrawal, AKI, lithium toxicity - seemingly undifferentiated at this time, likely related to multiple etiologies - ?lingering medication effect from home psych meds   - EEG with no seizure  - Think this is primarily due to medication and metabolic derrangements  P: Ok for low dose precedex, did have brady event yesterda  Decrease Fent drip, bolus for sedation needs Low dose bdzs  May need an MRI   Penile Drainage, remains possible source of infection   -culture positive for staph, enterococcus P: - still on broad spectrum abx and   Acute on Chronic Lithium Toxicity with Acute Toxic Encephalopathy  - s/p HD on 10/18 with normalization of Li levels. P: - still with good UOP, observing  - discussed with nephrology yesterday   Bipolar Disorder  - patient denied suicidal intent.   -S/p psych eval 10/19 P: Decreased PTA home meds   Probable OSA / OHS P: Intubated  Acute on Chronic Renal Failure, Stage 2 CKD Nephrogenic DI from lithium, ?resolved Hypertnatremia, FWD, Na+ load given Hyperchloremia Hypokalemia   P: FW replacement via OGT  Significant UOP overnight  We are going to need renals input regarding his electrolyte imbalances Discussed with nephrology   Bladder outlet obstruction  -s/p foley, patient of Dr. Tresa Moore, h/o of radiation tx for prostate Ca - spoke with urology, ok for nursing to change foley routine  P: - change foley today  -  flomax and proscar   Macrocytic Anemia  - LDH mildly elevated, no schistocytes, burr cells can be seen in renal disease  - ?ICU related BM suppression, sepsis associated - B12 normal - will check retic count    Thrombocytopenia P: Continuing to fall, will stop heparin, as PLT <50K Moderate risk 4T score But with fevers and drop in platelets could be drug effect Stopped H2B and use PPI  Will discuss with pharmacy regarding HIT panel orders to r/o verse stopping heparin   DM Hyperglycemia  P: Start insulin ggt   Dusky left index and middle finger Obtaining Left UE duplex  Will inform vascular surgery  Started argatroban  Could just be from vasopressor use and ongoing sepsis.   Disposition / Summary of Today's Plan 07/06/18   Arterial duplex, argatroban, stopped heparin    Diet: TF  Pain/Anxiety/Delirium protocol (if indicated): Yes  VAP protocol (if indicated): Yes  DVT prophylaxis: heparin stopped d/t low platelets  GI prophylaxis: N/A. Hyperglycemia protocol: SSI Mobility: Bedrest. Code Status: full. Family Communication: No family has visited   Labs (personally reviewed)  CBC: Recent Labs  Lab 07/01/18 [phone] 07/03/18 0710 07/04/18  [id number] 07/05/18 0544 07/06/18 0856  WBC 11.5* 10.0 12.5* 18.0* 12.5*  NEUTROABS  --   --  10.5* 14.8*  --   HGB 11.9* 12.2* 10.9* 10.6* 9.6*  HCT 39.8 41.4 37.3* 37.4* 33.5*  MCV 98.8 103.0* 104.8* 107.5* 107.0*  PLT 179 112* 77* 69* 41*    Basic Metabolic Panel: Recent Labs  Lab 07/01/18 0627 07/02/18 0229 07/03/18 0710 07/03/18 0953 07/03/18 1612 07/04/18 0520 07/05/18 0544  NA 145 146* 153*  --   --  148* 152*  K 3.3* 3.8 4.3  --   --  3.5 3.6  CL 117* 120* 124*  --   --  123* 125*  CO2 19* 18* 19*  --   --  19* 18*  GLUCOSE 241* 217* 304*  --   --  392* 329*  BUN 16 24* 32*  --   --  45* 41*  CREATININE 2.17* 2.86* 3.15*  --   --  3.22* 2.88*  CALCIUM 10.8* 10.9* 10.4*  --   --  10.0 10.3  MG 2.5* 2.8*  --  2.9*  2.8* 2.6* 2.7*  PHOS  --  3.2  --  4.8* 4.4 4.1 3.4   GFR: Estimated Creatinine Clearance: 41.5 mL/min (A) (by C-G formula based on SCr of 2.88 mg/dL (H)). Recent Labs  Lab 07/01/18 1654 07/02/18 0229 07/03/18 0710 07/04/18 0520 07/05/18 0544 07/06/18 0856  PROCALCITON 0.58 0.63 0.47  --   --   --   WBC  --   --  10.0 12.5* 18.0* 12.5*  LATICACIDVEN 1.1  --   --   --   --   --     Liver Function Tests: Recent Labs  Lab 06/30/18 0553 07/01/18 0627 07/04/18 0520 07/05/18 0544  AST  --  28 22 21   ALT  --  40 24 22  ALKPHOS  --  62 48 45  BILITOT  --  0.7 0.6 0.5  PROT  --  6.2* 6.2* 6.0*  ALBUMIN 3.2* 3.0* 2.7* 2.6*   No results for input(s): LIPASE, AMYLASE in the last 168 hours. No results for input(s): AMMONIA in the last 168 hours.  ABG    Component Value Date/Time   PHART 7.213 (L) 07/06/2018 1000   PCO2ART 41.4 07/06/2018 1000   PO2ART 57.7 (L) 07/06/2018 1000   HCO3 15.9 (L) 07/06/2018 1000   TCO2 20 (L) 07/03/2018 2130   ACIDBASEDEF 10.4 (H) 07/06/2018 1000   O2SAT 88.2 07/06/2018 1000     Coagulation Profile: No results for input(s): INR, PROTIME in the last 168 hours.  Cardiac Enzymes: No results for input(s): CKTOTAL, CKMB, CKMBINDEX, TROPONINI in the last 168 hours.  HbA1C: Hemoglobin A1C  Date/Time Value Ref Range Status  08/25/2015 9.9  Final   Hgb A1c MFr Bld  Date/Time Value Ref Range Status  07/19/2017 08:30 AM 5.5 4.6 - 6.5 % Final    Comment:    Glycemic Control Guidelines for People with Diabetes:Non Diabetic:  <6%Goal of Therapy: <7%Additional Action Suggested:  >8%   04/21/2017 10:13 AM 5.7 4.6 - 6.5 % Final    Comment:    Glycemic Control Guidelines for People with Diabetes:Non Diabetic:  <6%Goal of Therapy: <7%Additional Action Suggested:  >8%     CBG: Recent Labs  Lab 07/05/18 1540 07/05/18 1925 07/05/18 2317 07/06/18 0311 07/06/18 0732  GLUCAP 272* 227* 153* 228* 253*    This patient is critically ill with multiple  organ system failure; which, requires frequent high  complexity decision making, assessment, support, evaluation, and titration of therapies. This was completed through the application of advanced monitoring technologies and extensive interpretation of multiple databases. During this encounter critical care time was devoted to patient care services described in this note for 45 minutes.   Garner Nash, DO [facility] 07/06/2018 10:24 AM  Personal pager: [phone] If unanswered, please page CCM On-call: [phone]

## 2018-07-06 NOTE — Progress Notes (Signed)
Nutrition Follow-up  DOCUMENTATION CODES:   Obesity unspecified  INTERVENTION:    Continue:  Vital High Protein @ 55 ml/hr (1320 ml/day) via OG tube 60 ml Prostat BID  Provides: 1720 kcal, 175 grams protein, and 1103 ml free water.    NUTRITION DIAGNOSIS:   Increased nutrient needs related to acute illness(sepsis) as evidenced by estimated needs. Ongoing.   GOAL:   Provide needs based on ASPEN/SCCM guidelines Met.   MONITOR:   TF tolerance, Skin  ASSESSMENT:   Pt with PMH of bipolar affective disorder, DM 2, HTN, Prostate ca, OSA on CPAP admitted 10/18 with AKI requiring urgent HD for acute on chronic lithium toxicity. Pt with increasing agitation tx to ICU and intubated 10/28.     Pt discussed during ICU rounds and with RN.  Pt moving around in bed a lot, in 4 point restraints, on sedation. MV noted in the 20's. Pt remains on cooling blanket  Patient is currently intubated on ventilator support Temp (24hrs), Avg:99.3 F (37.4 C), Min:97.6 F (36.4 C), Max:100.1 F (37.8 C)  Medications reviewed and include: folic acid, lantus 22 units BID, MVI, thiamine  Insulin drip  D5@ 200 ml/hr Levophed @ 18 mcg Free water: 250 ml every 4 hours = 1500 ml  Labs reviewed: Na 152 (H) CBG (last 3)  Recent Labs    07/06/18 0732 07/06/18 1214 07/06/18 1325  GLUCAP 253* 293* 309*    BP: 144/72 MAP: 89   UOP: 4925 ml x 24 hrs I/O: -19.1 L/day OG tube, tip likely distal stomach    Diet Order:   Diet Order            Diet NPO time specified  Diet effective now              EDUCATION NEEDS:   No education needs have been identified at this time  Skin:  Skin Assessment: (MASD: buttocks and groin)  Last BM:  775 ml x 25 hr  Height:   Ht Readings from Last 1 Encounters:  07/03/18 6' 2"  (1.88 m)    Weight:   Wt Readings from Last 1 Encounters:  07/04/18 (!) 139.2 kg    Ideal Body Weight:  86.3 kg  BMI:  Body mass index is 39.4 kg/m.  Estimated  Nutritional Needs:   Kcal:  7078-6754  Protein:  >/= 172 grams  Fluid:  > 2 L/day  Maylon Peppers RD, LDN, CNSC [phone] Pager [phone] After Hours Pager

## 2018-07-06 NOTE — Progress Notes (Signed)
ANTICOAGULATION CONSULT NOTE - Initial Consult  Pharmacy Consult for argatroban Indication: cool hand, r/o HIT  Allergies  Allergen Reactions  . Heparin     HIT AB pending    Patient Measurements: Height: 6\' 2"  (188 cm) Weight: (!) 306 lb 14.1 oz (139.2 kg) IBW/kg (Calculated) : 82.2  Vital Signs: Temp: 100.1 F (37.8 C) (10/31 1116) Temp Source: Core (10/31 1116) BP: 129/49 (10/31 1000) Pulse Rate: 116 (10/31 1000)  Labs: Recent Labs    07/04/18 0520 07/05/18 0544 07/06/18 0856  HGB 10.9* 10.6* 9.6*  HCT 37.3* 37.4* 33.5*  PLT 77* 69* 41*  CREATININE 3.22* 2.88* 2.86*    Estimated Creatinine Clearance: 41.8 mL/min (A) (by C-G formula based on SCr of 2.86 mg/dL (H)).  Medications:  Infusions:  . sodium chloride 150 mL/hr at 07/06/18 1000  . sodium chloride Stopped (07/03/18 1039)  . sodium chloride    . argatroban    . ceFEPime (MAXIPIME) IV Stopped (07/05/18 1614)  . dexmedetomidine 0.6 mcg/kg/hr (07/06/18 1000)  . dextrose    . feeding supplement (VITAL HIGH PROTEIN) 1,000 mL (07/06/18 0746)  . fentaNYL infusion INTRAVENOUS 300 mcg/hr (07/06/18 1000)  . fluconazole (DIFLUCAN) IV Stopped (07/05/18 1938)  . insulin    . metronidazole Stopped (07/06/18 0839)  . norepinephrine (LEVOPHED) Adult infusion 16 mcg/min (07/06/18 1041)  . phenylephrine (NEO-SYNEPHRINE) Adult infusion Stopped (07/05/18 0903)  . vancomycin Stopped (07/05/18 1826)    Assessment: 62 yom initially on heparin SQ for VTE prophylaxis. However, platelets dropped to 41 today. 4T score at least 5. HIT antibody sent. Also pt now with dusky/cool hand per MD assessment. Heparin SQ stopped and now starting argatroban for anticoagulation. Hgb is low at 9.6 but stable. No bleeding noted.   Goal of Therapy:  aPTT 50-90 seconds Monitor platelets by anticoagulation protocol: Yes   Plan:  Check baseline aPTT and INR Argatroban 0.73mcg/kg/min based on ideal body weight Check a 2 hr aPTT Daily aPTT  and CBC F/u vascular F/u HIT antibody  Izadora Roehr, Rande Lawman 07/06/2018,11:26 AM

## 2018-07-06 NOTE — Progress Notes (Addendum)
[place] for argatroban Indication: cool hand, r/o HIT   Patient Measurements: Height: 6\' 2"  (188 cm) Weight: (!) 306 lb 14.1 oz (139.2 kg) IBW/kg (Calculated) : 82.2  Vital Signs: Temp: 100.4 F (38 C) (10/31 1555) Temp Source: Core (10/31 1555) BP: 108/72 (10/31 1600) Pulse Rate: 100 (10/31 1600)  Labs: Recent Labs    07/04/18 0520 07/05/18 0544 07/06/18 0856 07/06/18 1156 07/06/18 1240 07/06/18 1410  HGB 10.9* 10.6* 9.6*  --   --   --   HCT 37.3* 37.4* 33.5*  --   --   --   PLT 77* 69* 41*  --   --   --   APTT  --   --   --  34  --  47*  LABPROT  --   --   --  18.3*  --   --   INR  --   --   --  1.54  --   --   CREATININE 3.22* 2.88* 2.86*  --  2.85*  --     Assessment: 57 year old male initially on heparin SQ for VTE prophylaxis. However, platelets dropped to 41 today. 4T score at least 5. HIT antibody sent. Also pt now with dusky/cool hand per MD assessment. Hgb is low at 9.6 but stable. Initial aPTT returned subtherapeutic.    Goal of Therapy:  aPTT 50-90 seconds Monitor platelets by anticoagulation protocol: Yes    Plan:  Increase argatroban to 0.6 mcg/kg/min  Check a 2 hr aPTT Daily aPTT/CBC F/u HIT antibody Discussed with RN   Nima Bamburg, Jake Church 07/06/2018,4:22 PM   Addendum -There is some discrepancy with the argatroban charting from earlier this evening, I have ordered another level to help clarify this   Harvel Quale 07/06/2018 10:13 PM

## 2018-07-06 NOTE — Consult Note (Addendum)
Hospital Consult   Reason for Consult:  Discoloration L hand Requesting Physician:  Dr. Valeta Harms MRN #:  [id number]  History of Present Illness: This is a 57 y.o. male with past medical history significant for bipolar disorder admitted and treated with hemodialysis for lithium toxicity.  He has been seen in consultation for evaluation of left hand due to coolness and discoloration having had arterial line and left radial artery at the wrist.  Patient is currently intubated and sedated, thus history was obtained by chart review as well as from presentation by the attending physician.  He continues to be on pressor support.  There is concern for a heparin allergy, thus argatroban has been ordered.  Past Medical History:  Diagnosis Date  . Anxiety   . Arthritis   . Benign essential HTN 10/01/2016  . Bipolar 1 disorder ([place])    "well controlled on Lithium"  . Bruises easily   . Contact lens/glasses fitting   . Depression   . Diabetes mellitus   . Fatigue   . Gallstones   . Generalized headaches   . Hearing loss   . Hyperlipidemia   . Hypertension    boarderline - elevated  . Kidney stone   . Obesity   . OSA on CPAP   . Poor circulation   . Prostate cancer ([place]) 02/14/14   Gleason 4+3=7, volume 56 mL  . S/P radiation therapy 07/03/2014 through 08/28/2014                                                       Prostate 7800 cGy in 40 sessions, seminal vesicles 5600 cGy in 40 sessions                          Past Surgical History:  Procedure Laterality Date  . LAPAROSCOPIC GASTRIC BANDING  09/09/09  . PROSTATE BIOPSY  02/14/14   Gleason 7, volume 56 mL    Allergies  Allergen Reactions  . Heparin     HIT AB pending    Prior to Admission medications   Medication Sig Start Date End Date Taking? Authorizing Provider  citalopram (CELEXA) 40 MG tablet Take 40 mg by mouth daily.     Yes [provider]  clonazePAM (KLONOPIN) 1 MG tablet Take 1 mg by mouth 3 (three) times  daily.    Yes [provider]  finasteride (PROSCAR) 5 MG tablet Take 5 mg by mouth daily.   Yes [provider]  lamoTRIgine (LAMICTAL) 200 MG tablet Take 400 mg by mouth at bedtime.    Yes [provider]  lithium carbonate (ESKALITH) 450 MG CR tablet Take 1,800 mg by mouth at bedtime.    Yes [provider]  Melatonin 5 MG TABS Take 5 mg by mouth at bedtime as needed (sleep).   Yes [provider]  QUEtiapine (SEROQUEL) 100 MG tablet Take 200 mg by mouth at bedtime.  06/10/14  Yes [provider]  BAYER MICROLET LANCETS lancets Use as instructed to check blood sugar 2 times per day dx code E11.9 10/23/15   Elayne Snare, MD  CONTOUR NEXT TEST test strip TEST BLOOD SUGAR TWICE DAILY AS DIRECTED 08/21/17   Elayne Snare, MD    Social History   Socioeconomic History  . Marital status:  Single    Spouse name: Not on file  . Number of children: Not on file  . Years of education: Not on file  . Highest education level: Not on file  Occupational History  . Not on file  Social Needs  . Financial resource strain: Not on file  . Food insecurity:    Worry: Not on file    Inability: Not on file  . Transportation needs:    Medical: Not on file    Non-medical: Not on file  Tobacco Use  . Smoking status: Never Smoker  . Smokeless tobacco: Never Used  Substance and Sexual Activity  . Alcohol use: No  . Drug use: No  . Sexual activity: Not on file  Lifestyle  . Physical activity:    Days per week: Not on file    Minutes per session: Not on file  . Stress: Not on file  Relationships  . Social connections:    Talks on phone: Not on file    Gets together: Not on file    Attends religious service: Not on file    Active member of club or organization: Not on file    Attends meetings of clubs or organizations: Not on file    Relationship status: Not on file  . Intimate partner violence:    Fear of current or ex partner: Not on file     Emotionally abused: Not on file    Physically abused: Not on file    Forced sexual activity: Not on file  Other Topics Concern  . Not on file  Social History Narrative  . Not on file     Family History  Problem Relation Age of Onset  . Arthritis Mother   . Alcohol abuse Mother   . Arthritis Father     ROS: Otherwise negative unless mentioned in HPI  Physical Examination  Vitals:   07/06/18 1215 07/06/18 1230  BP: 104/70 121/69  Pulse: 84 (!) 114  Resp: 20 (!) 24  Temp:    SpO2: 100% 95%   Body mass index is 39.4 kg/m.  General:  Sedated Gait: Not observed HENT: WNL, normocephalic Pulmonary: Mechanically ventilated Cardiac: regular Abdomen: Soft but distended Skin: without rashes Vascular Exam/Pulses: 2+ palpable right DP pulse; 1+ palpable left DP pulse Extremities: Left thumb index and middle finger cool to touch with dark discoloration; at least monophasic signal distal to prior arterial line stick site; triphasic left ulnar; multiphasic left palmar arch by Doppler Musculoskeletal: no muscle wasting or atrophy  Neurologic: Sedated Psychiatric: Sedated Lymph:  Unremarkable  CBC    Component Value Date/Time   WBC 12.5 (H) 07/06/2018 0856   RBC 3.13 (L) 07/06/2018 0856   RBC 3.14 (L) 07/06/2018 0856   HGB 9.6 (L) 07/06/2018 0856   HCT 33.5 (L) 07/06/2018 0856   PLT 41 (L) 07/06/2018 0856   MCV 107.0 (H) 07/06/2018 0856   MCH 30.7 07/06/2018 0856   MCHC 28.7 (L) 07/06/2018 0856   RDW 14.5 07/06/2018 0856   LYMPHSABS 1.8 07/05/2018 0544   MONOABS 1.3 (H) 07/05/2018 0544   EOSABS 0.0 07/05/2018 0544   BASOSABS 0.0 07/05/2018 0544    BMET    Component Value Date/Time   NA 153 (H) 07/06/2018 0856   K 3.0 (L) 07/06/2018 0856   CL >130 (HH) 07/06/2018 0856   CO2 18 (L) 07/06/2018 0856   GLUCOSE 303 (H) 07/06/2018 0856   BUN 40 (H) 07/06/2018 0856   CREATININE 2.86 (H) 07/06/2018 4132  CALCIUM 9.9 07/06/2018 0856   GFRNONAA 23 (L) 07/06/2018 0856    GFRAA 26 (L) 07/06/2018 0856    COAGS: Lab Results  Component Value Date   INR 1.54 07/06/2018   INR 1.10 11/17/2015   INR 1.06 09/26/2014    ASSESSMENT/PLAN: This is a 57 y.o. male with discoloration and coolness in left hand having previously had arterial line and left wrist  - Dusky appearing left thumb, index, and middle finger -  Patient however seems to be perfusing left hand adequately.  There is a triphasic ulnar signal by Doppler as well as multiphasic palmar arch signal by Doppler; at least a monophasic signal of radial by Doppler distal to prior A-line site -no indication for revascularization at this time  - Agree with starting Argatroban - We will continue to follow; on-call vascular surgeon Dr. Carlis Abbott was also involved in the evaluation management plan of this patient   Dagoberto Ligas PA-C [facility] [phone]  I have seen and evaluated the patient. I agree with the PA note as documented above. Admitted with lithium toxicity, now intubated on pressors.  Dusky left hand at thumb, index finger noted with arterial line in place.  Arterial line now removed.  Hand does not appear immediately threatened - has triphasic ulnar signal and triphasic palmar arch signal.  Will need anticoagulation - argatroban ok if heparin contra-indicated.  We will follow hand for any changes.    Marty Heck, MD [facility] Office: [phone] Pager: [phone]

## 2018-07-07 ENCOUNTER — Inpatient Hospital Stay (HOSPITAL_COMMUNITY): Payer: 59

## 2018-07-07 DIAGNOSIS — E870 Hyperosmolality and hypernatremia: Secondary | ICD-10-CM

## 2018-07-07 DIAGNOSIS — N179 Acute kidney failure, unspecified: Secondary | ICD-10-CM

## 2018-07-07 DIAGNOSIS — Z9884 Bariatric surgery status: Secondary | ICD-10-CM

## 2018-07-07 DIAGNOSIS — R358 Other polyuria: Secondary | ICD-10-CM

## 2018-07-07 LAB — BASIC METABOLIC PANEL
ANION GAP: 4 — AB (ref 5–15)
ANION GAP: 5 (ref 5–15)
Anion gap: 2 — ABNORMAL LOW (ref 5–15)
BUN: 36 mg/dL — ABNORMAL HIGH (ref 6–20)
BUN: 35 mg/dL — ABNORMAL HIGH (ref 6–20)
BUN: 33 mg/dL — AB (ref 6–20)
CALCIUM: 10 mg/dL (ref 8.9–10.3)
CALCIUM: 10 mg/dL (ref 8.9–10.3)
CHLORIDE: 128 mmol/L — AB (ref 98–111)
CO2: 19 mmol/L — ABNORMAL LOW (ref 22–32)
CO2: 18 mmol/L — AB (ref 22–32)
CO2: 20 mmol/L — ABNORMAL LOW (ref 22–32)
CREATININE: 2.65 mg/dL — AB (ref 0.61–1.24)
CREATININE: 2.63 mg/dL — AB (ref 0.61–1.24)
Calcium: 9.9 mg/dL (ref 8.9–10.3)
Chloride: 130 mmol/L — ABNORMAL HIGH (ref 98–111)
Chloride: 130 mmol/L — ABNORMAL HIGH (ref 98–111)
Creatinine, Ser: 2.65 mg/dL — ABNORMAL HIGH (ref 0.61–1.24)
GFR calc Af Amer: 29 mL/min — ABNORMAL LOW (ref >60)
GFR calc Af Amer: 29 mL/min — ABNORMAL LOW (ref >60)
GFR, EST AFRICAN AMERICAN: 29 mL/min — AB (ref >60)
GFR, EST NON AFRICAN AMERICAN: 25 mL/min — AB (ref >60)
GFR, EST NON AFRICAN AMERICAN: 25 mL/min — AB (ref >60)
GFR, EST NON AFRICAN AMERICAN: 25 mL/min — AB (ref >60)
GLUCOSE: 234 mg/dL — AB (ref 70–99)
GLUCOSE: 221 mg/dL — AB (ref 70–99)
Glucose, Bld: 240 mg/dL — ABNORMAL HIGH (ref 70–99)
POTASSIUM: 3.2 mmol/L — AB (ref 3.5–5.1)
Potassium: 3.4 mmol/L — ABNORMAL LOW (ref 3.5–5.1)
Potassium: 3.4 mmol/L — ABNORMAL LOW (ref 3.5–5.1)
SODIUM: 152 mmol/L — AB (ref 135–145)
Sodium: 153 mmol/L — ABNORMAL HIGH (ref 135–145)
Sodium: 151 mmol/L — ABNORMAL HIGH (ref 135–145)

## 2018-07-07 LAB — RENAL FUNCTION PANEL
ALBUMIN: 2.3 g/dL — AB (ref 3.5–5.0)
BUN: 34 mg/dL — ABNORMAL HIGH (ref 6–20)
CALCIUM: 9.9 mg/dL (ref 8.9–10.3)
CO2: 22 mmol/L (ref 22–32)
CREATININE: 2.51 mg/dL — AB (ref 0.61–1.24)
Chloride: >130 mmol/L (ref 98–111)
GFR, EST AFRICAN AMERICAN: 31 mL/min — AB (ref >60)
GFR, EST NON AFRICAN AMERICAN: 27 mL/min — AB (ref >60)
Glucose, Bld: 224 mg/dL — ABNORMAL HIGH (ref 70–99)
PHOSPHORUS: 2.5 mg/dL (ref 2.5–4.6)
Potassium: 3.1 mmol/L — ABNORMAL LOW (ref 3.5–5.1)
SODIUM: 154 mmol/L — AB (ref 135–145)

## 2018-07-07 LAB — CBC
HCT: 35.3 % — ABNORMAL LOW (ref 39.0–52.0)
HEMATOCRIT: 34.7 % — AB (ref 39.0–52.0)
HEMOGLOBIN: 9.7 g/dL — AB (ref 13.0–17.0)
Hemoglobin: 9.6 g/dL — ABNORMAL LOW (ref 13.0–17.0)
MCH: 29.4 pg (ref 26.0–34.0)
MCH: 30.2 pg (ref 26.0–34.0)
MCHC: 27.2 g/dL — AB (ref 30.0–36.0)
MCHC: 28 g/dL — ABNORMAL LOW (ref 30.0–36.0)
MCV: 108 fL — ABNORMAL HIGH (ref 80.0–100.0)
MCV: 108.1 fL — AB (ref 80.0–100.0)
NRBC: 1.5 % — AB (ref 0.0–0.2)
PLATELETS: 49 10*3/uL — AB (ref 150–400)
Platelets: 44 10*3/uL — ABNORMAL LOW (ref 150–400)
RBC: 3.27 MIL/uL — ABNORMAL LOW (ref 4.22–5.81)
RBC: 3.21 MIL/uL — AB (ref 4.22–5.81)
RDW: 14.7 % (ref 11.5–15.5)
RDW: 14.6 % (ref 11.5–15.5)
WBC: 13.5 10*3/uL — ABNORMAL HIGH (ref 4.0–10.5)
WBC: 13.2 10*3/uL — ABNORMAL HIGH (ref 4.0–10.5)
nRBC: 1.4 % — ABNORMAL HIGH (ref 0.0–0.2)

## 2018-07-07 LAB — CULTURE, BLOOD (ROUTINE X 2)

## 2018-07-07 LAB — GLUCOSE, CAPILLARY
GLUCOSE-CAPILLARY: 118 mg/dL — AB (ref 70–99)
GLUCOSE-CAPILLARY: 122 mg/dL — AB (ref 70–99)
GLUCOSE-CAPILLARY: 125 mg/dL — AB (ref 70–99)
GLUCOSE-CAPILLARY: 139 mg/dL — AB (ref 70–99)
GLUCOSE-CAPILLARY: 153 mg/dL — AB (ref 70–99)
GLUCOSE-CAPILLARY: 177 mg/dL — AB (ref 70–99)
GLUCOSE-CAPILLARY: 191 mg/dL — AB (ref 70–99)
GLUCOSE-CAPILLARY: 193 mg/dL — AB (ref 70–99)
GLUCOSE-CAPILLARY: 193 mg/dL — AB (ref 70–99)
GLUCOSE-CAPILLARY: 199 mg/dL — AB (ref 70–99)
GLUCOSE-CAPILLARY: 221 mg/dL — AB (ref 70–99)
Glucose-Capillary: 184 mg/dL — ABNORMAL HIGH (ref 70–99)
Glucose-Capillary: 135 mg/dL — ABNORMAL HIGH (ref 70–99)
Glucose-Capillary: 205 mg/dL — ABNORMAL HIGH (ref 70–99)
Glucose-Capillary: 88 mg/dL (ref 70–99)
Glucose-Capillary: 221 mg/dL — ABNORMAL HIGH (ref 70–99)
Glucose-Capillary: 199 mg/dL — ABNORMAL HIGH (ref 70–99)
Glucose-Capillary: 148 mg/dL — ABNORMAL HIGH (ref 70–99)
Glucose-Capillary: 182 mg/dL — ABNORMAL HIGH (ref 70–99)

## 2018-07-07 LAB — ABO/RH: ABO/RH(D): O POS

## 2018-07-07 LAB — APTT: aPTT: 65 seconds — ABNORMAL HIGH (ref 24–36)

## 2018-07-07 LAB — HEPARIN INDUCED PLATELET AB (HIT ANTIBODY): HEPARIN INDUCED PLT AB: 2.749 {OD_unit} — AB (ref 0.000–0.400)

## 2018-07-07 LAB — CORTISOL: Cortisol, Plasma: 24.1 ug/dL

## 2018-07-07 MED ORDER — VASOPRESSIN 20 UNIT/ML IV SOLN
0.0400 [IU]/min | INTRAVENOUS | Status: DC
  Administered 2018-07-07 – 2018-07-09 (×3): 0.04 [IU]/min via INTRAVENOUS
  Filled 2018-07-07 (×5): qty 2

## 2018-07-07 MED ORDER — PRISMASOL BGK 4/2.5 32-4-2.5 MEQ/L IV SOLN
INTRAVENOUS | Status: DC
  Administered 2018-07-07 – 2018-07-10 (×16): via INTRAVENOUS_CENTRAL

## 2018-07-07 MED ORDER — SODIUM CHLORIDE 0.9% IV SOLUTION
Freq: Once | INTRAVENOUS | Status: AC
  Administered 2018-07-07: 14:00:00 via INTRAVENOUS

## 2018-07-07 MED ORDER — SODIUM CHLORIDE 0.9 % IV SOLN
3.0000 g | Freq: Three times a day (TID) | INTRAVENOUS | Status: DC
  Administered 2018-07-07 – 2018-07-10 (×8): 3 g via INTRAVENOUS
  Filled 2018-07-07 (×9): qty 3

## 2018-07-07 MED ORDER — SODIUM CHLORIDE 0.9 % FOR CRRT
INTRAVENOUS_CENTRAL | Status: DC | PRN
  Filled 2018-07-07: qty 1000

## 2018-07-07 MED ORDER — PRISMASOL BGK 4/2.5 32-4-2.5 MEQ/L REPLACEMENT SOLN
Status: DC
  Administered 2018-07-07 – 2018-07-09 (×6): via INTRAVENOUS_CENTRAL
  Filled 2018-07-07 (×11): qty 5000

## 2018-07-07 MED ORDER — PRISMASOL BGK 4/2.5 32-4-2.5 MEQ/L REPLACEMENT SOLN
Status: DC
  Administered 2018-07-07 – 2018-07-09 (×4): via INTRAVENOUS_CENTRAL
  Filled 2018-07-07 (×7): qty 5000

## 2018-07-07 NOTE — Progress Notes (Signed)
[place] for argatroban Indication: cool hand, r/o HIT   Patient Measurements: Height: 6\' 2"  (188 cm) Weight: (!) 306 lb 14.1 oz (139.2 kg) IBW/kg (Calculated) : 82.2  Vital Signs: Temp: 100 F (37.8 C) (11/01 0715) Temp Source: Axillary (11/01 0715) BP: 135/83 (11/01 0715) Pulse Rate: 114 (11/01 0715)  Labs: Recent Labs    07/05/18 0544 07/06/18 0856 07/06/18 1156  07/06/18 1648 07/06/18 1650 07/06/18 2010 07/06/18 2230 07/07/18 0510  HGB 10.6* 9.6*  --   --   --   --   --   --  9.6*  HCT 37.4* 33.5*  --   --   --   --   --   --  35.3*  PLT 69* 41*  --   --   --   --   --   --  49*  APTT  --   --  34   < >  --  53*  --  64* 65*  LABPROT  --   --  18.3*  --   --   --   --   --   --   INR  --   --  1.54  --   --   --   --   --   --   CREATININE 2.88* 2.86*  --    < > 2.82*  --  2.75*  --  2.65*   < > = values in this interval not displayed.    Assessment: 57 year old male initially on heparin SQ for VTE prophylaxis. However, platelets dropped to 41 yesterday. 4T score at least 5. HIT antibody sent and currently in process. Pt now with dusky/cool hand per MD assessment but no intervention by vascular at this point. Hgb is low at 9.6 but stable. APTT remains therapeutic this AM. No bleeding noted.   Goal of Therapy:  aPTT 50-90 seconds Monitor platelets by anticoagulation protocol: Yes   Plan:  Continue argatroban at 0.6 mcg/kg/min  Daily aPTT/CBC F/u HIT antibody  Gearldene Fiorenza, Rande Lawman 07/07/2018,7:35 AM

## 2018-07-07 NOTE — Progress Notes (Signed)
NAME:  Frank Cox, MRN:  [id number], DOB:  07/28/1961, LOS: 61 ADMISSION DATE:  07/06/2018, CONSULTATION DATE:  07/04/2018 REFERRING MD:  Stark Jock, ED [facility], CHIEF COMPLAINT:  Alerted mental status.  Brief History   57 year old man who requires urgent hemodialysis for acute on chronic lithium toxicity. Has been increasingly confused, experiencing multiple falls over the last week.  Li level 2.37 (upper limit 1.2) and nephrology wishes to dialyze the patient.   He was transferred out of ICU on 10/20.  On 10/24, he had agitation that did not respond to ativan or haldol; therefore, PCCM was called back for transfer to ICU.  Past Medical History  Bipolar affective disorder, T2DM on OHA, HTN, Prostate cancer, OSA on CPAP.  Significant Hospital Events   10/18 Admit with acute renal failure (cr 1.61) Foley catheter > 2 L immediately.  Underwent emergent dialysis for lithium toxicity 10/19 Lithium normalized.   10/20 Tx to SDU 10/24 Tx back to ICU due to agitation 10/26 PCCM called back for AMS, intermittent agitation 10/28 Intubated   Consults: date of consult/date signed off & final recs:  Nephrology 10/18 - recommending HD Poison Control - recommending hydration, following serial Li levels. Psych 10/19 - do not restart lithium, needs psych consult once encephalopathy clears, recommending inpatient psych admission once medically cleared Urology 10/19 - input regarding possible obstructive uropathy, they recommend keeping foley in and follow up as outpatient  Procedures (surgical and bedside):  10/29 [facility]J CVC   Significant Diagnostic Tests:  CT head 10/15 > negative. Renal US 10/18 > diabetic glomerular nephropathy.  No obstruction.  Bilateral benign appearing renal cyts.  CT Head 10/29 >> Neg for acute process, enlarge orbital vein? CT Abd Pelvis 10/29 >> Negative for infectious source, fluid in colon   EEG 10/29 >>> slowing, no seizure   Micro Data:  Urine 10/18 > negative Blood 10/20  > negative Blood 10/23 > negative  Urine 10/23 > negative Penile culture 10/22 >> staph lugdunensis (S-, entercoccus (vanco sens)  UCx 10/28 >> NGTD  Blood 10/28 >> NGTD, X1 with Meth resistant staph prb contaminant   Trach Asp 10/28 >> NGTD    Antimicrobials:  Zosyn 10/20 > 10/21 Ceftriaxone 10/21 > 10/23 Unasyn 10/26 >>> Oral Vanco 10/28, c diff neg, stopped   Cefepime 10/28 Vanco 10/28 Metronidazole 10/28   Fluconazole 10/29  Subjective:  Fevers better. Sodium mildly better. Continues with UOP, looks fluid overloaded   Objective   Blood pressure 128/70, pulse (!) 122, temperature 99.6 F (37.6 C), temperature source Core (Comment), resp. rate (!) 23, height 6\' 2"  (1.88 m), weight (!) 139.2 kg, SpO2 100 %.    Vent Mode: PRVC FiO2 (%):  [40 %] 40 % Set Rate:  [24 bmp] 24 bmp Vt Set:  [650 mL] 650 mL PEEP:  [8 cmH20] 8 cmH20 Plateau Pressure:  [19 cmH20-23 cmH20] 23 cmH20   Intake/Output Summary (Last 24 hours) at 07/07/2018 0853 Last data filed at 07/07/2018 0800 Gross per 24 hour  Intake 7323.85 ml  Output 5650 ml  Net 1673.85 ml   Filed Weights   06/29/18 1350 07/04/18 0500  Weight: (!) 139.6 kg (!) 139.2 kg    Examination: General appearance: 57 y.o., male, intubated on MV, sedated  Eyes: anicteric sclerae, moist conjunctivae; pupils reactive  HENT: NCAT; oropharynx, ETT in place  Neck: Trachea midline; no JVD  Lungs: BL vented breath sounds  CV: RRR, S1, S2, distant heart tones  Abdomen: Soft, non-tender; non-distended  Extremities: No peripheral edema, radial and DP pulses present bilaterally  Skin: left hand, index and middle dusky in appearance  Psych: prob on going delirium  Neuro: moving all 4 extremities spontaneously without purpose    Assessment & Plan:   Septic Shock, unclear source, possible aspiration after being obtunded due to AMS, small right sided infiltrate, could represent atelectasis, placed, fever 103F, prior cx with staph and  enterococcus from penile discharge, one blood cx +methicillin resistant staph species   - Fever Tmax 103F, ?drug induced, fever improved  - c diff neg, cultures NGTD  P: On unasyn, fluconazole and vanco Awaiting BCx speciate I suspect this is contaminate  Titrate Nepi to maintain MAP at 65 mmHg Added vasopressin IV to decrease NEpi use  Avoid phenylephrine   Acute Hypoxemic Hypercarbic Respiratory Failure  Hypercarbia was likely due to AMS  P: Secondary to above Remains on vent No changes at this time   Acute toxic, metabolic encephalopathy secondary to above  Agitated Delirium   - intermittent, initially thought related to ETOH withdrawal, AKI, lithium toxicity - seemingly undifferentiated at this time, likely related to multiple etiologies - ?lingering medication effect from home psych meds   - EEG with no seizure  - Think this is primarily due to medication and metabolic derrangements  P: Wean fent ggt  Ok to continue low dose precedex Difficult to manage with pressor requirements  May obtain MRI   Acute on Chronic Lithium Toxicity with Acute Toxic Encephalopathy  - s/p HD on 10/18 with normalization of Li levels. P: - concern for ongoing nephrogenic DI   Bipolar Disorder  - patient denied suicidal intent.   -S/p psych eval 10/19 P: Decreasing home meds  Dose reduced everything  Cut lamictal by 50%   Probable OSA / OHS P: Intubated, nothing to due at this time Candidate for PAP once extubated   Acute on Chronic Renal Failure, Stage 2 CKD Nephrogenic DI from lithium, ?resolved Hypertnatremia, FWD, Na+ load given Hyperchloremia Hypokalemia   P: Large increase in FW replacement I am concerned of his third spacing effect  We need to be aware of this  Will discuss with nephrology utility of metolazone with nephrology  ?would he be a candidate for CRRT to remove fluid and correct electrolytes  He looks clinically fluid overloaded   Bladder outlet obstruction    -s/p foley, patient of Dr. Tresa Moore, h/o of radiation tx for prostate Ca - spoke with urology, ok for nursing to change foley routine  P: - new foley, fever curve improved    Macrocytic Anemia  - LDH mildly elevated, no schistocytes, burr cells can be seen in renal disease  - ?ICU related BM suppression, sepsis associated - B12 normal, retics normal  - continuing to follow    Thrombocytopenia P: Platelets remain low  HIT Ab pending  On argatroban  Moderate risk 4T score   DM Hyperglycemia  P: Better on insulin ggt Will titrate per protocol   Dusky left index and middle finger On argatroban  Duplex negative  Probably sepsis and pressor related ischemia  Avoid phenylephrine   Disposition / Summary of Today's Plan 07/07/18   Correct electrolytes     Diet: TF  Pain/Anxiety/Delirium protocol (if indicated): Yes  VAP protocol (if indicated): Yes  DVT prophylaxis: argatroban  GI prophylaxis: N/A. Hyperglycemia protocol: SSI Mobility: Bedrest. Code Status: full. Family Communication: No family has visited   Labs (personally reviewed)  CBC: Recent Labs  Lab 07/04/18 0520 07/05/18  1540 07/06/18 0856 07/07/18 0510 07/07/18 0628  WBC 12.5* 18.0* 12.5* 13.5* 13.2*  NEUTROABS 10.5* 14.8*  --   --   --   HGB 10.9* 10.6* 9.6* 9.6* 9.7*  HCT 37.3* 37.4* 33.5* 35.3* 34.7*  MCV 104.8* 107.5* 107.0* 108.0* 108.1*  PLT 77* 69* 41* 49* 44*    Basic Metabolic Panel: Recent Labs  Lab 07/02/18 0229  07/03/18 0953 07/03/18 1612 07/04/18 0520 07/05/18 0544  07/06/18 1240 07/06/18 1648 07/06/18 2010 07/07/18 0510 07/07/18 0628  NA 146*   < >  --   --  148* 152*   < > 152* 153* 154* 153* 151*  K 3.8   < >  --   --  3.5 3.6   < > 3.7 3.3* 3.2* 3.4* 3.2*  CL 120*   < >  --   --  123* 125*   < > 130* 129* 129* 130* 128*  CO2 18*   < >  --   --  19* 18*   < > 18* 18* 18* 19* 18*  GLUCOSE 217*   < >  --   --  392* 329*   < > 380* 370* 289* 234* 221*  BUN 24*   < >  --   --   45* 41*   < > 43* 40* 38* 36* 35*  CREATININE 2.86*   < >  --   --  3.22* 2.88*   < > 2.85* 2.82* 2.75* 2.65* 2.65*  CALCIUM 10.9*   < >  --   --  10.0 10.3   < > 10.0 10.2 10.1 10.0 9.9  MG 2.8*  --  2.9* 2.8* 2.6* 2.7*  --   --   --   --   --   --   PHOS 3.2  --  4.8* 4.4 4.1 3.4  --   --   --   --   --   --    < > = values in this interval not displayed.   GFR: Estimated Creatinine Clearance: 45.1 mL/min (A) (by C-G formula based on SCr of 2.65 mg/dL (H)). Recent Labs  Lab 07/01/18 1654 07/02/18 0229 07/03/18 0710  07/05/18 0544 07/06/18 0856 07/07/18 0510 07/07/18 0628  PROCALCITON 0.58 0.63 0.47  --   --   --   --   --   WBC  --   --  10.0   < > 18.0* 12.5* 13.5* 13.2*  LATICACIDVEN 1.1  --   --   --   --   --   --   --    < > = values in this interval not displayed.    Liver Function Tests: Recent Labs  Lab 07/01/18 0627 07/04/18 0520 07/05/18 0544  AST 28 22 21   ALT 40 24 22  ALKPHOS 62 48 45  BILITOT 0.7 0.6 0.5  PROT 6.2* 6.2* 6.0*  ALBUMIN 3.0* 2.7* 2.6*   No results for input(s): LIPASE, AMYLASE in the last 168 hours. No results for input(s): AMMONIA in the last 168 hours.  ABG    Component Value Date/Time   PHART 7.213 (L) 07/06/2018 1000   PCO2ART 41.4 07/06/2018 1000   PO2ART 57.7 (L) 07/06/2018 1000   HCO3 15.9 (L) 07/06/2018 1000   TCO2 20 (L) 07/03/2018 2130   ACIDBASEDEF 10.4 (H) 07/06/2018 1000   O2SAT 88.2 07/06/2018 1000     Coagulation Profile: Recent Labs  Lab 07/06/18 1156  INR 1.54    Cardiac  Enzymes: No results for input(s): CKTOTAL, CKMB, CKMBINDEX, TROPONINI in the last 168 hours.  HbA1C: Hemoglobin A1C  Date/Time Value Ref Range Status  08/25/2015 9.9  Final   Hgb A1c MFr Bld  Date/Time Value Ref Range Status  07/19/2017 08:30 AM 5.5 4.6 - 6.5 % Final    Comment:    Glycemic Control Guidelines for People with Diabetes:Non Diabetic:  <6%Goal of Therapy: <7%Additional Action Suggested:  >8%   04/21/2017 10:13 AM 5.7  4.6 - 6.5 % Final    Comment:    Glycemic Control Guidelines for People with Diabetes:Non Diabetic:  <6%Goal of Therapy: <7%Additional Action Suggested:  >8%     CBG: Recent Labs  Lab 07/07/18 0135 07/07/18 0258 07/07/18 0508 07/07/18 0625 07/07/18 0732  GLUCAP 184* 135* 205* 191* 88   This patient is critically ill with multiple organ system failure; which, requires frequent high complexity decision making, assessment, support, evaluation, and titration of therapies. This was completed through the application of advanced monitoring technologies and extensive interpretation of multiple databases. During this encounter critical care time was devoted to patient care services described in this note for 55 minutes.  Garner Nash, DO [facility] 07/07/2018 8:53 AM  Personal pager: [phone] If unanswered, please page CCM On-call: [phone]

## 2018-07-07 NOTE — Progress Notes (Signed)
[facility] Progress Note    Assessment/ Plan:   MS- lithium level 2.37 and crt of 1.75    1. Renal/Hypernatremia - HD times one on 10/18 for lithium toxicity, level normalized. Then discovered to have BOO s/p foley -then polyuria and hypernatremia either due to post obs diuresis or nephrogenic DI from lithium (when pt lucid he did report longstanding thirst so think is neph DI).   - Renal function certainly not normal but fairly stable; sepsis/ATN/recent obstructive etiology. Great UOP overnight.   - -Did not tolerate D5W at high ratesbec of hyperglycemia but now on insulin gtt and will d/c the 1/2 ns (likely hypertonic c/w urine) and give D5W. Recheck chem panel at 6pm so we don't get behind.  -  Free watervia OGT to236m q4hras toleratedon 10/30 + D5W@200  are still inadequate and pt with encephalopathy as well.  - Unable to give NSAID bec of the AKI; Metolazone or thiazide usually would help but not likely in pt with compromised renal function.  - Poor or no response to high dose desmopressin IN x2 doses (not convinced he will respond given extensive damage to kidneys from chronic Li; not unexpected.  - Will start CRRT today to help correct metabolic abnormalities and also for possible uremia causing the encephalopathy.  - Once CRRT is started may stop the free water and D5W  2. HTN/vol-Actually BP is now on low side. 3. Anemia- not an issue 4. ID-Cultures and rocephin per primary- penile drainage enterococcus faecalis and staph lugdunensis on cefepime, flagyl, Unasyn and Vancomycin (doesn't appear to have been given yet)   6. Hypokalemia- repleting(3.4) 7. Psych- behaviors requiring transfer to ICU and IV sedation  Subjective:   On ventbut very agitated req sedation.  UOP very good.   Objective:   BP 116/69   Pulse (!) 118   Temp 99.6 F (37.6 C) (Core (Comment))   Resp (!) 26   Ht 6' 2"  (1.88 m)   Wt (!) 139.2 kg   SpO2 98%    BMI 39.40 kg/m   Intake/Output Summary (Last 24 hours) at 07/07/2018 1020 Last data filed at 07/07/2018 1000 Gross per 24 hour  Intake 5757.46 ml  Output 5945 ml  Net -187.54 ml   Weight change:   Physical Exam: General:resting, restraints Heart: RRR Lungs: clear Abdomen: soft, non tender, decr freq Extremities:1+edema Dialysis Access: had right subclavian temp cath - now removed  Imaging: Vas UKoreaUpper Extremity Arterial Duplex  Result Date: 07/06/2018 UPPER EXTREMITY DUPLEX STUDY Indications: Possible lt radial artery thrombosis, recent arterial catheter              placement.  Performing Technologist: MAbram Sander Examination Guidelines: A complete evaluation includes B-mode imaging, spectral Doppler, color Doppler, and power Doppler as needed of all accessible portions of each vessel. Bilateral testing is considered an integral part of a complete examination. Limited examinations for reoccurring indications may be performed as noted.  Left Doppler Findings: +--------+----------+---------+------+--------+ Site    PSV (cm/s)Waveform PlaqueComments +--------+----------+---------+------+--------+ [id number]      triphasic               +--------+----------+---------+------+--------+ Radial  99        triphasic               +--------+----------+---------+------+--------+ Ulnar   128       triphasic               +--------+----------+---------+------+--------+  Summary:  Left: No obstruction visualized  in the left upper extremity. *See table(s) above for measurements and observations. Electronically signed by Monica Martinez MD on 07/06/2018 at 6:15:22 PM.    Final     Labs: BMET Recent Labs  Lab 07/02/18 0109  07/03/18 3235 07/03/18 1612 07/04/18 5732 07/05/18 2025 07/06/18 4270 07/06/18 1240 07/06/18 1648 07/06/18 2010 07/07/18 0510 07/07/18 0628 07/07/18 0845  NA 146*   < >  --   --  148* 152* 153* 152* 153* 154* 153* 151* 152*  K 3.8    < >  --   --  3.5 3.6 3.0* 3.7 3.3* 3.2* 3.4* 3.2* 3.4*  CL 120*   < >  --   --  123* 125* >130* 130* 129* 129* 130* 128* 130*  CO2 18*   < >  --   --  19* 18* 18* 18* 18* 18* 19* 18* 20*  GLUCOSE 217*   < >  --   --  392* 329* 303* 380* 370* 289* 234* 221* 240*  BUN 24*   < >  --   --  45* 41* 40* 43* 40* 38* 36* 35* 33*  CREATININE 2.86*   < >  --   --  3.22* 2.88* 2.86* 2.85* 2.82* 2.75* 2.65* 2.65* 2.63*  CALCIUM 10.9*   < >  --   --  10.0 10.3 9.9 10.0 10.2 10.1 10.0 9.9 10.0  PHOS 3.2  --  4.8* 4.4 4.1 3.4  --   --   --   --   --   --   --    < > = values in this interval not displayed.   CBC Recent Labs  Lab 07/04/18 0520 07/05/18 0544 07/06/18 0856 07/07/18 0510 07/07/18 0628  WBC 12.5* 18.0* 12.5* 13.5* 13.2*  NEUTROABS 10.5* 14.8*  --   --   --   HGB 10.9* 10.6* 9.6* 9.6* 9.7*  HCT 37.3* 37.4* 33.5* 35.3* 34.7*  MCV 104.8* 107.5* 107.0* 108.0* 108.1*  PLT 77* 69* 41* 49* 44*    Medications:    . aspirin  81 mg Per Tube Daily  . chlorhexidine gluconate (MEDLINE KIT)  15 mL Mouth Rinse BID  . citalopram  10 mg Per Tube Daily  . clonazepam  0.25 mg Per Tube TID  . feeding supplement (PRO-STAT SUGAR FREE 64)  60 mL Per Tube BID  . finasteride  5 mg Oral Daily  . folic acid  1 mg Intravenous Daily  . free water  250 mL Per Tube Q4H  . insulin glargine  22 Units Subcutaneous BID  . lamoTRIgine  200 mg Per Tube QHS  . mouth rinse  15 mL Mouth Rinse 10 times per day  . multivitamin  15 mL Per Tube Daily  . nystatin   Topical BID  . pantoprazole sodium  40 mg Per Tube Daily  . QUEtiapine  25 mg Per Tube QHS  . sodium chloride flush  3 mL Intravenous Q12H  . tamsulosin  0.8 mg Oral Daily  . thiamine injection  100 mg Intravenous Q24H      Otelia Santee, MD 07/07/2018, 10:20 AM

## 2018-07-07 NOTE — Progress Notes (Addendum)
Patient was transported to MRI and back without incident  

## 2018-07-07 NOTE — Progress Notes (Signed)
Spoke to pharmacy regarding Argatroban dose, ideal weight vs actual weight. PTT drawn at 2015 needed to be recollected but since Argatroban was decreased to 2.34ml/hr, which is ideal weight dose per pharmacy order at 2015, order to not recollect at this time and redraw PTT in 4 hours and keep dose at 2.27ml/hr.

## 2018-07-07 NOTE — Progress Notes (Signed)
PCCM:  Discussed with renal.  Plans for CRRT.  Will coordinate MRI Holding of argatroban Placement of HD catheter And start of CRRT for this afternoon.  Garner Nash, DO [facility] 07/07/2018 10:12 AM  Personal pager: [phone] If unanswered, please page CCM On-call: [phone]

## 2018-07-07 NOTE — Progress Notes (Signed)
[facility]  Subjective  - Duplex of left hand yesterday showed no arterial occlusion.  Remains intubated on 16 mcg levophed.    Objective 135/83 (!) 114 100 F (37.8 C) (Axillary) (!) 27 98%  Intake/Output Summary (Last 24 hours) at 07/07/2018 0804 Last data filed at 07/07/2018 0729 Gross per 24 hour  Intake 7156.26 ml  Output 5650 ml  Net 1506.26 ml    General Intubated, critically ill Palpable left radial and ulnar pulse at wrist Cyanosis to tip of left 1st, 2nd, 3rd digit Triphasic left palmar arch signal  Laboratory Lab Results: Recent Labs    07/07/18 0510 07/07/18 0628  WBC 13.5* 13.2*  HGB 9.6* 9.7*  HCT 35.3* 34.7*  PLT 49* 44*   BMET Recent Labs    07/07/18 0510 07/07/18 0628  NA 153* 151*  K 3.4* 3.2*  CL 130* 128*  CO2 19* 18*  GLUCOSE 234* 221*  BUN 36* 35*  CREATININE 2.65* 2.65*  CALCIUM 10.0 9.9    COAG Lab Results  Component Value Date   INR 1.54 07/06/2018   INR 1.10 11/17/2015   INR 1.06 09/26/2014   No results found for: PTT  Assessment/Planning: Continue supportive care.  No arterial occlusion on left upper extremity duplex and left radial and ulnar pulse palpable with triphasic palmar arch signal.  No role for revascularization at this time.  Will continue to monitor left digits - may be pressor related.   Marty Heck 07/07/2018 8:04 AM --

## 2018-07-07 NOTE — Progress Notes (Signed)
[facility]ious [facility]   Reason for visit: Follow up on fever  Interval History: fever curve trending down, culture of penile discharge noted and with Staph and Enterococcus; WBC stab;e at 13.2. 1 positive culture for CoNS.  Remains intubated and encephalopathic;  Vancomycin, unasyn, fluconazole  Physical Exam: Constitutional:  Vitals:   07/07/18 0739 07/07/18 0800  BP:  128/70  Pulse:  (!) 122  Resp:  (!) 23  Temp:  99.6 F (37.6 C)  SpO2: 98% 100%   patient is intubated and sedated Eyes: anicteric HENT: +ET Respiratory: respiratory effort on vent; + rhonchi Cardiovascular: tachy RR GI: soft, nt, nd GU: no current penile discharge, improved right groin Candidal rash  Review of Systems: Unable to be assessed due to mental status  Lab Results  Component Value Date   WBC 13.2 (H) 07/07/2018   HGB 9.7 (L) 07/07/2018   HCT 34.7 (L) 07/07/2018   MCV 108.1 (H) 07/07/2018   PLT 44 (L) 07/07/2018    Lab Results  Component Value Date   CREATININE 2.65 (H) 07/07/2018   BUN 35 (H) 07/07/2018   NA 151 (H) 07/07/2018   K 3.2 (L) 07/07/2018   CL 128 (H) 07/07/2018   CO2 18 (L) 07/07/2018    Lab Results  Component Value Date   ALT 22 07/05/2018   AST 21 07/05/2018   ALKPHOS 45 07/05/2018     Microbiology: Recent Results (from the past 240 hour(s))  Aerobic/Anaerobic Culture (surgical/deep wound)     Status: None   Collection Time: 06/27/18  9:30 AM  Result Value Ref Range Status   Specimen Description PENILE DRAINAGE  Final   Special Requests Normal  Final   Gram Stain   Final    RARE WBC PRESENT,BOTH PMN AND MONONUCLEAR ABUNDANT SQUAMOUS EPITHELIAL CELLS PRESENT FEW GRAM POSITIVE COCCI RARE GRAM POSITIVE RODS    Culture   Final    ABUNDANT STAPHYLOCOCCUS LUGDUNENSIS MODERATE ENTEROCOCCUS FAECALIS NO ANAEROBES ISOLATED Performed at [facility], [place] [address], [place], [place] [address]    Report Status 07/02/2018 FINAL  Final   Organism ID, Bacteria STAPHYLOCOCCUS LUGDUNENSIS  Final   Organism ID, Bacteria ENTEROCOCCUS FAECALIS  Final      Susceptibility   Enterococcus faecalis - MIC*    AMPICILLIN <=2 SENSITIVE Sensitive     VANCOMYCIN 2 SENSITIVE Sensitive     GENTAMICIN SYNERGY SENSITIVE Sensitive     * MODERATE ENTEROCOCCUS FAECALIS   Staphylococcus lugdunensis - MIC*    CIPROFLOXACIN <=0.5 SENSITIVE Sensitive     ERYTHROMYCIN <=0.25 SENSITIVE Sensitive     GENTAMICIN <=0.5 SENSITIVE Sensitive     OXACILLIN 1 SENSITIVE Sensitive     TETRACYCLINE <=1 SENSITIVE Sensitive     VANCOMYCIN <=0.5 SENSITIVE Sensitive     TRIMETH/SULFA <=10 SENSITIVE Sensitive     CLINDAMYCIN <=0.25 SENSITIVE Sensitive     RIFAMPIN <=0.5 SENSITIVE Sensitive     Inducible Clindamycin NEGATIVE Sensitive     * ABUNDANT STAPHYLOCOCCUS LUGDUNENSIS  Culture, blood (routine x 2)     Status: None   Collection Time: 06/28/18 11:34 AM  Result Value Ref Range Status   Specimen Description BLOOD LEFT HAND  Final   Special Requests   Final    BOTTLES DRAWN AEROBIC ONLY Blood Culture adequate volume   Culture   Final    NO GROWTH 5 DAYS Performed at [facility], [place] [address], [place], [place] [address]    Report Status  07/03/2018 FINAL  Final  Culture, Urine     Status: None   Collection Time: 06/28/18 12:13 PM  Result Value Ref Range Status   Specimen Description URINE, CATHETERIZED  Final   Special Requests NONE  Final   Culture   Final    NO GROWTH Performed at [facility], [address], [place], [place] [address]    Report Status 06/29/2018 FINAL  Final  Culture, blood (routine x 2)     Status: None   Collection Time: 06/28/18 12:30 PM  Result Value Ref Range Status   Specimen Description BLOOD RIGHT HAND  Final   Special Requests   Final    BOTTLES DRAWN AEROBIC ONLY Blood Culture adequate volume   Culture   Final    NO GROWTH 5 DAYS Performed at [facility], [place] [address] [place], [place]  [address]    Report Status 07/03/2018 FINAL  Final  Culture, respiratory (non-expectorated)     Status: None   Collection Time: 07/03/18  8:29 AM  Result Value Ref Range Status   Specimen Description TRACHEAL ASPIRATE  Final   Special Requests NONE  Final   Gram Stain   Final    RARE WBC PRESENT,BOTH PMN AND MONONUCLEAR NO ORGANISMS SEEN    Culture   Final    RARE Consistent with normal respiratory flora. Performed at [facility], [place] [address], [place], [place] [address]    Report Status 07/05/2018 FINAL  Final  Culture, Urine     Status: None   Collection Time: 07/03/18  9:06 AM  Result Value Ref Range Status   Specimen Description URINE, RANDOM  Final   Special Requests NONE  Final   Culture   Final    NO GROWTH Performed at [facility], [place] [address], [place], [place] [address]    Report Status 07/04/2018 FINAL  Final  C difficile quick scan w PCR reflex     Status: None   Collection Time: 07/03/18  9:49 AM  Result Value Ref Range Status   C Diff antigen NEGATIVE NEGATIVE Final   C Diff toxin NEGATIVE NEGATIVE Final   C Diff interpretation No C. difficile detected.  Final    Comment: Performed at [facility], [place] [address] [place], [place] [address]  Culture, blood (routine x 2)     Status: None (Preliminary result)   Collection Time: 07/03/18  9:53 AM  Result Value Ref Range Status   Specimen Description BLOOD BLOOD RIGHT HAND  Final   Special Requests   Final    BOTTLES DRAWN AEROBIC AND ANAEROBIC Blood Culture results may not be optimal due to an inadequate volume of blood received in culture bottles   Culture   Final    NO GROWTH 3 DAYS Performed at [facility], [place] [address] [place], [place] [address]    Report Status PENDING  Incomplete  Culture, blood (routine x 2)     Status: None (Preliminary result)   Collection Time: 07/03/18  9:53 AM  Result Value Ref Range Status   Specimen Description BLOOD BLOOD LEFT HAND  Final    Special Requests   Final    BOTTLES DRAWN AEROBIC AND ANAEROBIC Blood Culture results may not be optimal due to an inadequate volume of blood received in culture bottles   Culture  Setup Time   Final    GRAM POSITIVE COCCI AEROBIC BOTTLE ONLY CRITICAL RESULT CALLED TO,  READ BACK BY AND VERIFIED WITH: Loni Muse Masters PharmD 17:20 07/06/18 ([email or web address]) Performed at [facility], [place] [address], [place], [place] [address]    Culture GRAM POSITIVE COCCI  Final   Report Status PENDING  Incomplete  Blood Culture ID Panel (Reflexed)     Status: Abnormal   Collection Time: 07/03/18  9:53 AM  Result Value Ref Range Status   Enterococcus species NOT DETECTED NOT DETECTED Final   Listeria monocytogenes NOT DETECTED NOT DETECTED Final   Staphylococcus species DETECTED (A) NOT DETECTED Final    Comment: Methicillin (oxacillin) resistant coagulase negative staphylococcus. Possible blood culture contaminant (unless isolated from more than one blood culture draw or clinical case suggests pathogenicity). No antibiotic treatment is indicated for blood  culture contaminants. CRITICAL RESULT CALLED TO, READ BACK BY AND VERIFIED WITH: A. Masters PharmD 17:20 07/06/18 ([email or web address])    Staphylococcus aureus (BCID) NOT DETECTED NOT DETECTED Final   Methicillin resistance DETECTED (A) NOT DETECTED Final    Comment: CRITICAL RESULT CALLED TO, READ BACK BY AND VERIFIED WITH: A. Masters PharmD 17:20 07/06/18 ([email or web address])    Streptococcus species NOT DETECTED NOT DETECTED Final   Streptococcus agalactiae NOT DETECTED NOT DETECTED Final   Streptococcus pneumoniae NOT DETECTED NOT DETECTED Final   Streptococcus pyogenes NOT DETECTED NOT DETECTED Final   Acinetobacter baumannii NOT DETECTED NOT DETECTED Final   Enterobacteriaceae species NOT DETECTED NOT DETECTED Final   Enterobacter cloacae complex NOT DETECTED NOT DETECTED Final   Escherichia coli NOT DETECTED NOT DETECTED Final   Klebsiella oxytoca NOT DETECTED NOT  DETECTED Final   Klebsiella pneumoniae NOT DETECTED NOT DETECTED Final   Proteus species NOT DETECTED NOT DETECTED Final   Serratia marcescens NOT DETECTED NOT DETECTED Final   Haemophilus influenzae NOT DETECTED NOT DETECTED Final   Neisseria meningitidis NOT DETECTED NOT DETECTED Final   Pseudomonas aeruginosa NOT DETECTED NOT DETECTED Final   Candida albicans NOT DETECTED NOT DETECTED Final   Candida glabrata NOT DETECTED NOT DETECTED Final   Candida krusei NOT DETECTED NOT DETECTED Final   Candida parapsilosis NOT DETECTED NOT DETECTED Final   Candida tropicalis NOT DETECTED NOT DETECTED Final    Comment: Performed at [facility], [address], [place], [place] [address]    Impression/Plan:  1. Fever - improving and on empiric therapy.   Will continue with antibiotics but more selectively with amp/sulbactam and fluconazole for now. Differential also drug fever.   I will stop vancomycin as positive blood culture c/w contaminate  2.  Leukocytosis - stable.  Will continue to monitor  3.  Encephalopathy - certainly Na could be a big part of continued encephalopathy.  No signs of infectious etiology.  I agree with MRI of the head.  Could consider sinus thrombosis or smaller abscess not noted on CT.  Occult embolic disease.    Discussed with Dr. Valeta Harms.

## 2018-07-07 NOTE — Procedures (Signed)
Hemodialysis Catheter Insertion Procedure Note Frank Cox [id number] 09-Sep-1960  Procedure: Insertion of Hemodialysis Catheter Indications: Hemodialysis  Procedure Details Consent: Risks of procedure as well as the alternatives and risks of each were explained to the (patient/caregiver).  Consent for procedure obtained. Obtained from patient's father by Kennieth Rad NP Time Out: Verified patient identification, verified procedure, site/side was marked, verified correct patient position, special equipment/implants available, medications/allergies/relevent history reviewed, required imaging and test results available.  Performed  Maximum sterile technique was used including antiseptics, cap, gloves, gown, hand hygiene, mask and sheet.  Skin prep: Iodine solution; local anesthetic administered  A Trialysis HD catheter was placed in the right internal jugular vein using the Seldinger technique. 20 cm cath used Placed at 18 cm mark.  Line sutured, biopatch placed, and sterile dressing applied.  Evaluation Blood flow good Complications: No apparent complications Patient did tolerate procedure well. Chest X-ray ordered to verify placement.  CXR: pending.   Procedure performed by Kennieth Rad AGACNP-BC under direct supervision of Dr. June Leap  and with ultrasound guidance for real time vessel cannulation.     Frank Cox, AGACNP-BC [facility] Pgr: [phone] 07/07/2018, 2:54 PM

## 2018-07-07 DEATH — deceased

## 2018-07-08 LAB — PREPARE PLATELET PHERESIS: Unit division: 0

## 2018-07-08 LAB — RENAL FUNCTION PANEL
Albumin: 2.3 g/dL — ABNORMAL LOW (ref 3.5–5.0)
Anion gap: 6 (ref 5–15)
BUN: 27 mg/dL — ABNORMAL HIGH (ref 6–20)
CHLORIDE: 121 mmol/L — AB (ref 98–111)
CO2: 22 mmol/L (ref 22–32)
CREATININE: 1.83 mg/dL — AB (ref 0.61–1.24)
Calcium: 9.3 mg/dL (ref 8.9–10.3)
GFR, EST AFRICAN AMERICAN: 45 mL/min — AB (ref >60)
GFR, EST NON AFRICAN AMERICAN: 39 mL/min — AB (ref >60)
Glucose, Bld: 176 mg/dL — ABNORMAL HIGH (ref 70–99)
POTASSIUM: 3.2 mmol/L — AB (ref 3.5–5.1)
Phosphorus: 2.8 mg/dL (ref 2.5–4.6)
Sodium: 149 mmol/L — ABNORMAL HIGH (ref 135–145)

## 2018-07-08 LAB — CBC
HEMATOCRIT: 32.6 % — AB (ref 39.0–52.0)
HEMOGLOBIN: 8.8 g/dL — AB (ref 13.0–17.0)
MCH: 29.1 pg (ref 26.0–34.0)
MCHC: 27 g/dL — ABNORMAL LOW (ref 30.0–36.0)
MCV: 107.9 fL — AB (ref 80.0–100.0)
Platelets: 53 10*3/uL — ABNORMAL LOW (ref 150–400)
RBC: 3.02 MIL/uL — AB (ref 4.22–5.81)
RDW: 14.9 % (ref 11.5–15.5)
WBC: 13 10*3/uL — ABNORMAL HIGH (ref 4.0–10.5)
nRBC: 1.8 % — ABNORMAL HIGH (ref 0.0–0.2)

## 2018-07-08 LAB — GLUCOSE, CAPILLARY
GLUCOSE-CAPILLARY: 122 mg/dL — AB (ref 70–99)
GLUCOSE-CAPILLARY: 123 mg/dL — AB (ref 70–99)
GLUCOSE-CAPILLARY: 126 mg/dL — AB (ref 70–99)
GLUCOSE-CAPILLARY: 131 mg/dL — AB (ref 70–99)
GLUCOSE-CAPILLARY: 159 mg/dL — AB (ref 70–99)
GLUCOSE-CAPILLARY: 165 mg/dL — AB (ref 70–99)
GLUCOSE-CAPILLARY: 165 mg/dL — AB (ref 70–99)
GLUCOSE-CAPILLARY: 175 mg/dL — AB (ref 70–99)
GLUCOSE-CAPILLARY: 176 mg/dL — AB (ref 70–99)
GLUCOSE-CAPILLARY: 180 mg/dL — AB (ref 70–99)
GLUCOSE-CAPILLARY: 198 mg/dL — AB (ref 70–99)
GLUCOSE-CAPILLARY: 227 mg/dL — AB (ref 70–99)
Glucose-Capillary: 155 mg/dL — ABNORMAL HIGH (ref 70–99)
Glucose-Capillary: 147 mg/dL — ABNORMAL HIGH (ref 70–99)
Glucose-Capillary: 157 mg/dL — ABNORMAL HIGH (ref 70–99)
Glucose-Capillary: 210 mg/dL — ABNORMAL HIGH (ref 70–99)
Glucose-Capillary: 82 mg/dL (ref 70–99)
Glucose-Capillary: 244 mg/dL — ABNORMAL HIGH (ref 70–99)
Glucose-Capillary: 159 mg/dL — ABNORMAL HIGH (ref 70–99)
Glucose-Capillary: 161 mg/dL — ABNORMAL HIGH (ref 70–99)
Glucose-Capillary: 155 mg/dL — ABNORMAL HIGH (ref 70–99)
Glucose-Capillary: 158 mg/dL — ABNORMAL HIGH (ref 70–99)

## 2018-07-08 LAB — APTT
aPTT: 56 seconds — ABNORMAL HIGH (ref 24–36)
aPTT: 58 seconds — ABNORMAL HIGH (ref 24–36)

## 2018-07-08 LAB — CULTURE, BLOOD (ROUTINE X 2): Culture: NO GROWTH

## 2018-07-08 LAB — MAGNESIUM: MAGNESIUM: 2.4 mg/dL (ref 1.7–2.4)

## 2018-07-08 LAB — BPAM PLATELET PHERESIS
BLOOD PRODUCT EXPIRATION DATE: 201911012359
ISSUE DATE / TIME: 201911011336
Unit Type and Rh: 6200

## 2018-07-08 MED ORDER — MIDAZOLAM HCL 2 MG/2ML IJ SOLN
INTRAMUSCULAR | Status: AC
  Administered 2018-07-08: 2 mg via INTRAVENOUS
  Filled 2018-07-08: qty 2

## 2018-07-08 MED ORDER — FENTANYL BOLUS VIA INFUSION
50.0000 ug | INTRAVENOUS | Status: DC | PRN
  Administered 2018-07-08: 50 ug via INTRAVENOUS
  Administered 2018-07-08 – 2018-07-09 (×6): 100 ug via INTRAVENOUS
  Filled 2018-07-08: qty 100

## 2018-07-08 MED ORDER — MIDAZOLAM HCL 2 MG/2ML IJ SOLN
2.0000 mg | INTRAMUSCULAR | Status: DC | PRN
  Administered 2018-07-08 (×4): 2 mg via INTRAVENOUS
  Administered 2018-07-09 – 2018-07-11 (×8): 4 mg via INTRAVENOUS
  Filled 2018-07-08: qty 4
  Filled 2018-07-08: qty 2
  Filled 2018-07-08 (×3): qty 4
  Filled 2018-07-08: qty 2
  Filled 2018-07-08 (×2): qty 4
  Filled 2018-07-08 (×2): qty 2
  Filled 2018-07-08 (×3): qty 4

## 2018-07-08 MED ORDER — FLUCONAZOLE IN SODIUM CHLORIDE 400-0.9 MG/200ML-% IV SOLN
400.0000 mg | INTRAVENOUS | Status: DC
  Administered 2018-07-08 – 2018-07-09 (×2): 400 mg via INTRAVENOUS
  Filled 2018-07-08 (×2): qty 200

## 2018-07-08 NOTE — Progress Notes (Signed)
NAME:  Frank Cox, MRN:  [id number], DOB:  14-Sep-1960, LOS: 8 ADMISSION DATE:  07/02/2018, CONSULTATION DATE:  06/30/2018 REFERRING MD:  Stark Jock, ED [facility], CHIEF COMPLAINT:  Alerted mental status.  Brief History   57 year old man who requires urgent hemodialysis for acute on chronic lithium toxicity. Has been increasingly confused, experiencing multiple falls over the last week.  Li level 2.37 (upper limit 1.2) and nephrology wishes to dialyze the patient.   He was transferred out of ICU on 10/20.  On 10/24, he had agitation that did not respond to ativan or haldol; therefore, PCCM was called back for transfer to ICU.  Past Medical History  Bipolar affective disorder, T2DM on OHA, HTN, Prostate cancer, OSA on CPAP.  Significant Hospital Events   10/18 Admit with acute renal failure (cr 1.61) Foley catheter > 2 L immediately.  Underwent emergent dialysis for lithium toxicity 10/19 Lithium normalized.   10/20 Tx to SDU 10/24 Tx back to ICU due to agitation 10/26 PCCM called back for AMS, intermittent agitation 10/28 Intubated   Consults: date of consult/date signed off & final recs:  Nephrology 10/18 - recommending HD Poison Control - recommending hydration, following serial Li levels. Psych 10/19 - do not restart lithium, needs psych consult once encephalopathy clears, recommending inpatient psych admission once medically cleared Urology 10/19 - input regarding possible obstructive uropathy, they recommend keeping foley in and follow up as outpatient  Procedures (surgical and bedside):  10/29 [facility]J CVC  11/1 [facility] HD Cath, started CRRT   Significant Diagnostic Tests:  CT head 10/15 > negative. Renal US 10/18 > diabetic glomerular nephropathy.  No obstruction.  Bilateral benign appearing renal cyts.  CT Head 10/29 >> Neg for acute process, enlarge orbital vein? CT Abd Pelvis 10/29 >> Negative for infectious source, fluid in colon   EEG 10/29 >>> slowing, no seizure   MRI Brain 11/1  >>> unremarkable   Micro Data:  Urine 10/18 > negative Blood 10/20 > negative Blood 10/23 > negative  Urine 10/23 > negative Penile culture 10/22 >> staph lugdunensis (S-, entercoccus (vanco sens)  UCx 10/28 >> NGTD  Blood 10/28 >> NGTD, X1 with Meth resistant staph prb contaminant   Trach Asp 10/28 >> NGTD    Antimicrobials:  Zosyn 10/20 > 10/21 Ceftriaxone 10/21 > 10/23 Unasyn 10/26 >>> Oral Vanco 10/28, c diff neg, stopped   Cefepime 10/28 Vanco 10/28 Metronidazole 10/28   Fluconazole 10/29  Subjective:  Started CRRT yesterday. HD cath. Intubated sedated on MV   Objective   Blood pressure (!) 92/59, pulse 80, temperature 98.8 F (37.1 C), temperature source Axillary, resp. rate (!) 23, height 6\' 2"  (1.88 m), weight (!) 139.2 kg, SpO2 98 %.    Vent Mode: PRVC FiO2 (%):  [40 %] 40 % Set Rate:  [24 bmp] 24 bmp Vt Set:  [650 mL] 650 mL PEEP:  [8 cmH20] 8 cmH20 Plateau Pressure:  [19 cmH20-23 cmH20] 23 cmH20   Intake/Output Summary (Last 24 hours) at 07/08/2018 0747 Last data filed at 07/08/2018 0700 Gross per 24 hour  Intake 6282.67 ml  Output 7296 ml  Net -1013.33 ml   Filed Weights   06/29/18 1350 07/04/18 0500  Weight: (!) 139.6 kg (!) 139.2 kg    Examination: General appearance: 57 y.o., male, intubated on mechanical ventilation  Eyes: anicteric, blinks to attack, occasional roving eye movement  HENT: NCAT; oropharynx Neck: Trachea midline Lungs: BL vented breath sounds CV: RRR, S1, S2 Abdomen: Soft, non-tender;  non-distended, BS present, obese  Extremities: 2+ peripheral edema, radial and DP pulses present bilaterally , left hand index and middle finger distal tip purple   Skin: distal tips cool  Psych: probably ongoing delirium Neuro: moves all extremities spontaneously, not purposeful     Assessment & Plan:   Septic Shock, unclear source, possible aspiration after being obtunded due to AMS, small right sided infiltrate, could represent  atelectasis, placed, fever 103F, prior cx with staph and enterococcus from penile discharge, one blood cx +methicillin resistant staph species(suspect contaminate)    - Fever Tmax 103F, ?drug induced, fever improved  - c diff neg, normal cortisol   P: Unasyn, fluconazole and vanco, fevers have resolved. ?I will let ID decide but would like to de-escalate and stop abx as soon as possible  Titrate NEpi to maintain MAP >65 Leaving Vasopressin on at 0.04, would like to wean NEPi off before stopping vaso   Acute Hypoxemic Hypercarbic Respiratory Failure  Hypercarbia was likely due to AMS  P: On vent, stable, no changes   Acute toxic, metabolic encephalopathy secondary to above  Agitated Delirium   - intermittent, initially thought related to ETOH withdrawal, AKI, lithium toxicity - seemingly undifferentiated at this time, likely related to multiple etiologies - ?lingering medication effect from home psych meds   - EEG with no seizure  - Think this is primarily due to medication and metabolic derrangements  P: Decrease Fent ggt Patient was NOT given any boluses overnight and the ggt was just increased  The best sedation practice is to bolus and reassess   Acute on Chronic Lithium Toxicity with Acute Toxic Encephalopathy  - s/p HD on 10/18 with normalization of Li levels. P: - concern for ongoing nephrogenic DI   Bipolar Disorder  - patient denied suicidal intent.   -S/p psych eval 10/19 P: Home pta meds have been reduced  Already cut lamictal by 50%  At risk for seizure if we withdrawal drugs too quickly   Probable OSA / OHS P: Intubated  Acute on Chronic Renal Failure, Stage 2 CKD Nephrogenic DI from lithium Hypertnatremia, FWD, Na+ load given Hyperchloremia Hypokalemia   P: Pulling 50/hr as pressure tolerates on CRRT Following electrolytes closely, managed with CRRT   Bladder outlet obstruction  -s/p foley, patient of Dr. Tresa Moore, h/o of radiation tx for prostate Ca -  spoke with urology, ok for nursing to change foley routine  P: - observe, follow up with urology   Macrocytic Anemia  - LDH mildly elevated, no schistocytes, burr cells can be seen in renal disease  - ?ICU related BM suppression, sepsis associated - B12 normal, retics normal  P: Following no intervention at this time  CRRT filter clotted yesterday, suspect slow drop in hgb related to blood loss   Thrombocytopenia Positive HIT Ab Possible HIT, SRA pending  P: Observing platelet level  On argatroban   DM Hyperglycemia  P: Titrate insulin ggt to maintain goal BGL   Dusky left index and middle finger Observing We appreciate vascular surgery input  titrating argatroban to maintain goal ptt Avoid phenylephrine  Disposition / Summary of Today's Plan 07/08/18   Correct electrolytes     Diet: TF  Pain/Anxiety/Delirium protocol (if indicated): Yes  VAP protocol (if indicated): Yes  DVT prophylaxis: argatroban  GI prophylaxis: N/A. Hyperglycemia protocol: SSI Mobility: Bedrest. Code Status: full. Family Communication: No family has visited   Labs (personally reviewed)  CBC: Recent Labs  Lab 07/04/18 0520 07/05/18 0544 07/06/18 0856 07/07/18  0510 07/07/18 0628 07/08/18 0446  WBC 12.5* 18.0* 12.5* 13.5* 13.2* 13.0*  NEUTROABS 10.5* 14.8*  --   --   --   --   HGB 10.9* 10.6* 9.6* 9.6* 9.7* 8.8*  HCT 37.3* 37.4* 33.5* 35.3* 34.7* 32.6*  MCV 104.8* 107.5* 107.0* 108.0* 108.1* 107.9*  PLT 77* 69* 41* 49* 44* 53*    Basic Metabolic Panel: Recent Labs  Lab 07/03/18 0953 07/03/18 1612 07/04/18 0520 07/05/18 0544  07/06/18 2010 07/07/18 0510 07/07/18 0628 07/07/18 0845 07/07/18 1618 07/08/18 0446  NA  --   --  148* 152*   < > 154* 153* 151* 152* 154*  --   K  --   --  3.5 3.6   < > 3.2* 3.4* 3.2* 3.4* 3.1*  --   CL  --   --  123* 125*   < > 129* 130* 128* 130* >130*  --   CO2  --   --  19* 18*   < > 18* 19* 18* 20* 22  --   GLUCOSE  --   --  392* 329*   < >  289* 234* 221* 240* 224*  --   BUN  --   --  45* 41*   < > 38* 36* 35* 33* 34*  --   CREATININE  --   --  3.22* 2.88*   < > 2.75* 2.65* 2.65* 2.63* 2.51*  --   CALCIUM  --   --  10.0 10.3   < > 10.1 10.0 9.9 10.0 9.9  --   MG 2.9* 2.8* 2.6* 2.7*  --   --   --   --   --   --  2.4  PHOS 4.8* 4.4 4.1 3.4  --   --   --   --   --  2.5  --    < > = values in this interval not displayed.   GFR: Estimated Creatinine Clearance: 47.6 mL/min (A) (by C-G formula based on SCr of 2.51 mg/dL (H)). Recent Labs  Lab 07/01/18 1654 07/02/18 0229 07/03/18 0710  07/06/18 0856 07/07/18 0510 07/07/18 0628 07/08/18 0446  PROCALCITON 0.58 0.63 0.47  --   --   --   --   --   WBC  --   --  10.0   < > 12.5* 13.5* 13.2* 13.0*  LATICACIDVEN 1.1  --   --   --   --   --   --   --    < > = values in this interval not displayed.    Liver Function Tests: Recent Labs  Lab 07/04/18 0520 07/05/18 0544 07/07/18 1618  AST 22 21  --   ALT 24 22  --   ALKPHOS 48 45  --   BILITOT 0.6 0.5  --   PROT 6.2* 6.0*  --   ALBUMIN 2.7* 2.6* 2.3*   No results for input(s): LIPASE, AMYLASE in the last 168 hours. No results for input(s): AMMONIA in the last 168 hours.  ABG    Component Value Date/Time   PHART 7.213 (L) 07/06/2018 1000   PCO2ART 41.4 07/06/2018 1000   PO2ART 57.7 (L) 07/06/2018 1000   HCO3 15.9 (L) 07/06/2018 1000   TCO2 20 (L) 07/03/2018 2130   ACIDBASEDEF 10.4 (H) 07/06/2018 1000   O2SAT 88.2 07/06/2018 1000     Coagulation Profile: Recent Labs  Lab 07/06/18 1156  INR 1.54    Cardiac Enzymes: No results for input(s): CKTOTAL, CKMB,  CKMBINDEX, TROPONINI in the last 168 hours.  HbA1C: Hemoglobin A1C  Date/Time Value Ref Range Status  08/25/2015 9.9  Final   Hgb A1c MFr Bld  Date/Time Value Ref Range Status  07/19/2017 08:30 AM 5.5 4.6 - 6.5 % Final    Comment:    Glycemic Control Guidelines for People with Diabetes:Non Diabetic:  <6%Goal of Therapy: <7%Additional Action Suggested:   >8%   04/21/2017 10:13 AM 5.7 4.6 - 6.5 % Final    Comment:    Glycemic Control Guidelines for People with Diabetes:Non Diabetic:  <6%Goal of Therapy: <7%Additional Action Suggested:  >8%     CBG: Recent Labs  Lab 07/08/18 0158 07/08/18 0300 07/08/18 0402 07/08/18 0504 07/08/18 0604  GLUCAP 155* 165* 159* 175* 147*   This patient is critically ill with multiple organ system failure; which, requires frequent high complexity decision making, assessment, support, evaluation, and titration of therapies. This was completed through the application of advanced monitoring technologies and extensive interpretation of multiple databases. During this encounter critical care time was devoted to patient care services described in this note for 46 minutes.   Garner Nash, DO [facility] 07/08/2018 7:47 AM  Personal pager: [phone] If unanswered, please page CCM On-call: [phone]

## 2018-07-08 NOTE — Progress Notes (Signed)
Patient ID: OTHAL KUBITZ, male   DOB: [birth year], 57 y.o.   MRN: [id number]          [facility] for Infectious Disease    Date of Admission:  06/28/2018   Total days of antibiotics 8        Day 5 fluconazole        Day 3 ampicillin sulbactam  He remains sedated on the ventilator.  He is now on CRRT.  Brain MRI is unrevealing.  No clear source for his recent fevers has been found.  We will continue current antibiotics for now.     Michel Bickers, MD [facility] for [facility] [phone] pager   [phone] cell 07/08/2018, 4:29 PM

## 2018-07-08 NOTE — Plan of Care (Signed)
  Problem: Education: Goal: Knowledge of General Education information will improve Description Including pain rating scale, medication(s)/side effects and non-pharmacologic comfort measures Outcome: Not Progressing   Problem: Health Behavior/Discharge Planning: Goal: Ability to manage health-related needs will improve Outcome: Not Progressing   Problem: Activity: Goal: Risk for activity intolerance will decrease Outcome: Not Progressing   Problem: Nutrition: Goal: Adequate nutrition will be maintained Outcome: Progressing   Problem: Coping: Goal: Level of anxiety will decrease Outcome: Not Progressing   Problem: Elimination: Goal: Will not experience complications related to bowel motility Outcome: Progressing Goal: Will not experience complications related to urinary retention Outcome: Progressing   Problem: Safety: Goal: Ability to remain free from injury will improve Outcome: Progressing   Problem: Skin Integrity: Goal: Risk for impaired skin integrity will decrease Outcome: Progressing

## 2018-07-08 NOTE — Progress Notes (Signed)
ANTICOAGULATION CONSULT NOTE - Follow Up Consult  Pharmacy Consult for argatroban Indication: dusky/cool hand w/ possible HIT  Labs: Recent Labs    07/06/18 0856 07/06/18 1156  07/06/18 2230 07/07/18 0510 07/07/18 0628 07/07/18 0845 07/07/18 1618 07/08/18 0016  HGB 9.6*  --   --   --  9.6* 9.7*  --   --   --   HCT 33.5*  --   --   --  35.3* 34.7*  --   --   --   PLT 41*  --   --   --  49* 44*  --   --   --   APTT  --  34   < > 64* 65*  --   --   --  56*  LABPROT  --  18.3*  --   --   --   --   --   --   --   INR  --  1.54  --   --   --   --   --   --   --   CREATININE 2.86*  --    < >  --  2.65* 2.65* 2.63* 2.51*  --    < > = values in this interval not displayed.    Assessment/Plan:  56yo male remains therapeutic on argatroban. Will continue gtt at current rate and confirm stable with am labs.   Wynona Neat, PharmD, BCPS  07/08/2018,2:17 AM

## 2018-07-08 NOTE — Progress Notes (Signed)
[facility] Progress Note    Assessment/ Plan:   MS- lithium level 2.37 and crt of 1.75   1. Renal/Hypernatremia - HD times one on 10/18 for lithium toxicity, level normalized. Then discovered to have BOO s/p foley -then polyuria and hypernatremia either due to post obs diuresis or nephrogenic DI from lithium (when pt lucid he did report longstanding thirst so think is neph DI).  - Renal function improved after starting CRRT yesterday, sepsis/ATN/recent obstructive etiology. Good UOP, net -1.6L yesterday.  - Free watervia OGT to216m q4hras toleratedon 10/30. Minimal improvement with D5W, now d/ced given CRRT.   - Unable to give NSAID bec of the AKI; Metolazone or thiazide usually would help but not likely in pt with compromised renal function.  - Poor or no response to high dose desmopressin IN x2 doses (not unexpected given extensive damage to kidneys from chronic Li). Started on CRRT yesterday with improvement in metabolic abnormalities. Still encephalopathic without purposeful movements although sedated on vent. MRI brain 11/1 unremarkable.  2. HTN/vol-Actually BP is now on low side. Currently on pressors. 4. Thrombocytopenia - positive HIT Ab, continues on argatroban. 5. Anemia - acute drop in Hb may be related to CRRT, continue to monitor 6. ID-Cultures and rocephin per primary and ID- penile drainage enterococcus faecalis and staph lugdunensis on fluconazole, unasyn. 7. Hypokalemia- repleting(3.2) 8. Psych- behaviors requiring transfer to ICU and IV sedation  Subjective:   Patient still without purposeful movement, sedated on vent.   Objective:   BP (!) 78/51 Comment: titrate up on levo   Pulse (!) 55   Temp 99 F (37.2 C) (Axillary)   Resp (!) 24   Ht 6' 2"  (1.88 m)   Wt (!) 139.2 kg   SpO2 98%   BMI 39.40 kg/m   Intake/Output Summary (Last 24 hours) at 07/08/2018 0912 Last data filed at 07/08/2018 0900 Gross per 24 hour  Intake  6267.88 ml  Output 7132 ml  Net -864.12 ml   Weight change:   Physical Exam: General:sedated, on vent Heart: RRR Lungs: CTAB Abdomen: obese abdomen, soft Extremities:still with pitting edema to all 4 extremities Dialysis Access: HD RIJ cath placed 11/1  Imaging: Mr Brain Wo Contrast  Result Date: 07/07/2018 CLINICAL DATA:  Persistent encephalopathy EXAM: MRI HEAD WITHOUT CONTRAST TECHNIQUE: Multiplanar, multiecho pulse sequences of the brain and surrounding structures were obtained without intravenous contrast. COMPARISON:  Head CT 07/04/2018 FINDINGS: Brain: No acute infarction, hemorrhage, hydrocephalus, extra-axial collection or mass lesion. Vascular: Major flow voids are preserved Skull and upper cervical spine: Low marrow signal in the cervical spine and partially in the clivus. No superimposed clival sclerosis by CT to suggest this is metastatic disease in this patient with history of prostate cancer. Sinuses/Orbits: Nasal cavity and nasopharyngeal fluid in the setting of intubation. Small bilateral mastoid fluid levels are likely related to the same. IMPRESSION: 1. Unremarkable MRI of the brain. No explanation for encephalopathy. 2. Low marrow signal, often red marrow reconversion, please correlate with CBC. Electronically Signed   By: JMonte FantasiaM.D.   On: 07/07/2018 11:54   Dg Chest Port 1 View  Result Date: 07/07/2018 CLINICAL DATA:  Dialysis catheter placement EXAM: PORTABLE CHEST 1 VIEW COMPARISON:  07/05/2018 FINDINGS: Right jugular dual lumen catheter tip in the lower SVC. No pneumothorax. Left jugular catheter tip in the proximal SVC. Endotracheal tube in good position. NG tube in place with the tip not visualized Negative for edema.  Bibasilar atelectasis/infiltrate unchanged. IMPRESSION: Satisfactory dialysis catheter  placement Bibasilar atelectasis/infiltrate unchanged Electronically Signed   By: Franchot Gallo M.D.   On: 07/07/2018 16:10   Vas [place] Upper Extremity  Arterial Duplex  Result Date: 07/06/2018 UPPER EXTREMITY DUPLEX STUDY Indications: Possible lt radial artery thrombosis, recent arterial catheter              placement.  Performing Technologist: Abram Sander  Examination Guidelines: A complete evaluation includes B-mode imaging, spectral Doppler, color Doppler, and power Doppler as needed of all accessible portions of each vessel. Bilateral testing is considered an integral part of a complete examination. Limited examinations for reoccurring indications may be performed as noted.  Left Doppler Findings: +--------+----------+---------+------+--------+ Site    PSV (cm/s)Waveform PlaqueComments +--------+----------+---------+------+--------+ [id number]       triphasic               +--------+----------+---------+------+--------+ Radial  99        triphasic               +--------+----------+---------+------+--------+ Ulnar   128       triphasic               +--------+----------+---------+------+--------+  Summary:  Left: No obstruction visualized in the left upper extremity. *See table(s) above for measurements and observations. Electronically signed by Monica Martinez MD on 07/06/2018 at 6:15:22 PM.    Final     Labs: BMET Recent Labs  Lab 07/02/18 0229  07/03/18 8242 07/03/18 1612 07/04/18 3536 07/05/18 1443  07/06/18 1648 07/06/18 2010 07/07/18 0510 07/07/18 1540 07/07/18 0845 07/07/18 1618 07/08/18 0446  NA 146*   < >  --   --  148* 152*   < > 153* 154* 153* 151* 152* 154* 149*  K 3.8   < >  --   --  3.5 3.6   < > 3.3* 3.2* 3.4* 3.2* 3.4* 3.1* 3.2*  CL 120*   < >  --   --  123* 125*   < > 129* 129* 130* 128* 130* >130* 121*  CO2 18*   < >  --   --  19* 18*   < > 18* 18* 19* 18* 20* 22 22  GLUCOSE 217*   < >  --   --  392* 329*   < > 370* 289* 234* 221* 240* 224* 176*  BUN 24*   < >  --   --  45* 41*   < > 40* 38* 36* 35* 33* 34* 27*  CREATININE 2.86*   < >  --   --  3.22* 2.88*   < > 2.82* 2.75* 2.65* 2.65*  2.63* 2.51* 1.83*  CALCIUM 10.9*   < >  --   --  10.0 10.3   < > 10.2 10.1 10.0 9.9 10.0 9.9 9.3  PHOS 3.2  --  4.8* 4.4 4.1 3.4  --   --   --   --   --   --  2.5 2.8   < > = values in this interval not displayed.   CBC Recent Labs  Lab 07/04/18 0520 07/05/18 0544 07/06/18 0856 07/07/18 0510 07/07/18 0628 07/08/18 0446  WBC 12.5* 18.0* 12.5* 13.5* 13.2* 13.0*  NEUTROABS 10.5* 14.8*  --   --   --   --   HGB 10.9* 10.6* 9.6* 9.6* 9.7* 8.8*  HCT 37.3* 37.4* 33.5* 35.3* 34.7* 32.6*  MCV 104.8* 107.5* 107.0* 108.0* 108.1* 107.9*  PLT 77* 69* 41* 49* 44* 53*    Medications:    .  aspirin  81 mg Per Tube Daily  . chlorhexidine gluconate (MEDLINE KIT)  15 mL Mouth Rinse BID  . citalopram  10 mg Per Tube Daily  . clonazepam  0.25 mg Per Tube TID  . feeding supplement (PRO-STAT SUGAR FREE 64)  60 mL Per Tube BID  . finasteride  5 mg Oral Daily  . folic acid  1 mg Intravenous Daily  . free water  250 mL Per Tube Q4H  . lamoTRIgine  200 mg Per Tube QHS  . mouth rinse  15 mL Mouth Rinse 10 times per day  . multivitamin  15 mL Per Tube Daily  . nystatin   Topical BID  . pantoprazole sodium  40 mg Per Tube Daily  . QUEtiapine  25 mg Per Tube QHS  . sodium chloride flush  3 mL Intravenous Q12H  . tamsulosin  0.8 mg Oral Daily  . thiamine injection  100 mg Intravenous Q24H      Rory Percy, DO PGY2 07/08/2018, 9:12 AM

## 2018-07-08 NOTE — Progress Notes (Signed)
ANTICOAGULATION CONSULT NOTE   Pharmacy Consult for argatroban Indication: cool hand, r/o HIT  Patient Measurements: Height: 6\' 2"  (188 cm) Weight: (!) 306 lb 14.1 oz (139.2 kg) IBW/kg (Calculated) : 82.2  Vital Signs: Temp: 98.3 F (36.8 C) (11/02 1200) Temp Source: Oral (11/02 1200) BP: 101/63 (11/02 1400) Pulse Rate: 63 (11/02 1400)  Labs: Recent Labs    07/06/18 1156  07/07/18 0510 07/07/18 0628 07/07/18 0845 07/07/18 1618 07/08/18 0016 07/08/18 0446  HGB  --   --  9.6* 9.7*  --   --   --  8.8*  HCT  --   --  35.3* 34.7*  --   --   --  32.6*  PLT  --   --  49* 44*  --   --   --  53*  APTT 34   < > 65*  --   --   --  56* 58*  LABPROT 18.3*  --   --   --   --   --   --   --   INR 1.54  --   --   --   --   --   --   --   CREATININE  --    < > 2.65* 2.65* 2.63* 2.51*  --  1.83*   < > = values in this interval not displayed.   Assessment: 57 year old male initially on heparin SQ for VTE prophylaxis with platelet drop to 41. 4T score at least 5. HIT antibody positive, OD 2.7. SRA in process. Pt with dusky/cool hand per MD assessment but no intervention by vascular at this point. Hgb now 8.8, pltc 53. APTT remains therapeutic this AM at 58. No bleeding noted.   Goal of Therapy:  aPTT 50-90 seconds Monitor platelets by anticoagulation protocol: Yes   Plan:  Continue argatroban at 0.6 mcg/kg/min  Daily aPTT/CBC F/u SRA result  Erin N. Gerarda Fraction, PharmD PGY2 Infectious Diseases Pharmacy Resident Phone: [phone] 07/08/2018,2:49 PM

## 2018-07-08 NOTE — Progress Notes (Addendum)
Pt having bradycardia into 30-40s -- sustaining for more than 5 minutes. BP within normal limits on levophed. Pulses palpated and pt responds to painful stimulus.Turned off fentanyl gtt and notified Dr. Jimmy Footman at [facility]. MD noted to try to refrain from using fentanyl, if possible. Will continue to monitor closely.

## 2018-07-09 LAB — RENAL FUNCTION PANEL
Albumin: 2.3 g/dL — ABNORMAL LOW (ref 3.5–5.0)
Albumin: 2.3 g/dL — ABNORMAL LOW (ref 3.5–5.0)
Anion gap: 4 — ABNORMAL LOW (ref 5–15)
Anion gap: 5 (ref 5–15)
BUN: 23 mg/dL — AB (ref 6–20)
BUN: 22 mg/dL — ABNORMAL HIGH (ref 6–20)
CHLORIDE: 112 mmol/L — AB (ref 98–111)
CHLORIDE: 111 mmol/L (ref 98–111)
CO2: 25 mmol/L (ref 22–32)
CO2: 27 mmol/L (ref 22–32)
CREATININE: 1.35 mg/dL — AB (ref 0.61–1.24)
Calcium: 9.4 mg/dL (ref 8.9–10.3)
Calcium: 9.4 mg/dL (ref 8.9–10.3)
Creatinine, Ser: 1.26 mg/dL — ABNORMAL HIGH (ref 0.61–1.24)
GFR calc Af Amer: >60 mL/min (ref >60)
GFR calc non Af Amer: 56 mL/min — ABNORMAL LOW (ref >60)
GLUCOSE: 147 mg/dL — AB (ref 70–99)
Glucose, Bld: 146 mg/dL — ABNORMAL HIGH (ref 70–99)
POTASSIUM: 4 mmol/L (ref 3.5–5.1)
POTASSIUM: 5.1 mmol/L (ref 3.5–5.1)
Phosphorus: 2.6 mg/dL (ref 2.5–4.6)
Phosphorus: 2.3 mg/dL — ABNORMAL LOW (ref 2.5–4.6)
Sodium: 141 mmol/L (ref 135–145)
Sodium: 143 mmol/L (ref 135–145)

## 2018-07-09 LAB — MAGNESIUM
MAGNESIUM: 2.3 mg/dL (ref 1.7–2.4)
Magnesium: 2.5 mg/dL — ABNORMAL HIGH (ref 1.7–2.4)

## 2018-07-09 LAB — CBC
HCT: 29.4 % — ABNORMAL LOW (ref 39.0–52.0)
Hemoglobin: 8.3 g/dL — ABNORMAL LOW (ref 13.0–17.0)
MCH: 29.3 pg (ref 26.0–34.0)
MCHC: 28.2 g/dL — AB (ref 30.0–36.0)
MCV: 103.9 fL — ABNORMAL HIGH (ref 80.0–100.0)
PLATELETS: 67 10*3/uL — AB (ref 150–400)
RBC: 2.83 MIL/uL — ABNORMAL LOW (ref 4.22–5.81)
RDW: 14.3 % (ref 11.5–15.5)
WBC: 15.1 10*3/uL — ABNORMAL HIGH (ref 4.0–10.5)
nRBC: 3.1 % — ABNORMAL HIGH (ref 0.0–0.2)

## 2018-07-09 LAB — GLUCOSE, CAPILLARY
GLUCOSE-CAPILLARY: 129 mg/dL — AB (ref 70–99)
GLUCOSE-CAPILLARY: 137 mg/dL — AB (ref 70–99)
GLUCOSE-CAPILLARY: 140 mg/dL — AB (ref 70–99)
GLUCOSE-CAPILLARY: 147 mg/dL — AB (ref 70–99)
GLUCOSE-CAPILLARY: 149 mg/dL — AB (ref 70–99)
GLUCOSE-CAPILLARY: 150 mg/dL — AB (ref 70–99)
GLUCOSE-CAPILLARY: 153 mg/dL — AB (ref 70–99)
GLUCOSE-CAPILLARY: 159 mg/dL — AB (ref 70–99)
GLUCOSE-CAPILLARY: 171 mg/dL — AB (ref 70–99)
GLUCOSE-CAPILLARY: 180 mg/dL — AB (ref 70–99)
GLUCOSE-CAPILLARY: 182 mg/dL — AB (ref 70–99)
GLUCOSE-CAPILLARY: 189 mg/dL — AB (ref 70–99)
GLUCOSE-CAPILLARY: 194 mg/dL — AB (ref 70–99)
Glucose-Capillary: 219 mg/dL — ABNORMAL HIGH (ref 70–99)
Glucose-Capillary: 157 mg/dL — ABNORMAL HIGH (ref 70–99)
Glucose-Capillary: 164 mg/dL — ABNORMAL HIGH (ref 70–99)
Glucose-Capillary: 187 mg/dL — ABNORMAL HIGH (ref 70–99)
Glucose-Capillary: 132 mg/dL — ABNORMAL HIGH (ref 70–99)
Glucose-Capillary: 153 mg/dL — ABNORMAL HIGH (ref 70–99)
Glucose-Capillary: 156 mg/dL — ABNORMAL HIGH (ref 70–99)
Glucose-Capillary: 203 mg/dL — ABNORMAL HIGH (ref 70–99)
Glucose-Capillary: 153 mg/dL — ABNORMAL HIGH (ref 70–99)
Glucose-Capillary: 152 mg/dL — ABNORMAL HIGH (ref 70–99)
Glucose-Capillary: 134 mg/dL — ABNORMAL HIGH (ref 70–99)
Glucose-Capillary: 139 mg/dL — ABNORMAL HIGH (ref 70–99)
Glucose-Capillary: 153 mg/dL — ABNORMAL HIGH (ref 70–99)
Glucose-Capillary: 154 mg/dL — ABNORMAL HIGH (ref 70–99)

## 2018-07-09 LAB — APTT: APTT: 59 s — AB (ref 24–36)

## 2018-07-09 LAB — BASIC METABOLIC PANEL
ANION GAP: 8 (ref 5–15)
BUN: 25 mg/dL — ABNORMAL HIGH (ref 6–20)
CALCIUM: 9.1 mg/dL (ref 8.9–10.3)
CO2: 24 mmol/L (ref 22–32)
Chloride: 108 mmol/L (ref 98–111)
Creatinine, Ser: 1.39 mg/dL — ABNORMAL HIGH (ref 0.61–1.24)
GFR calc Af Amer: >60 mL/min (ref >60)
GFR, EST NON AFRICAN AMERICAN: 54 mL/min — AB (ref >60)
Glucose, Bld: 229 mg/dL — ABNORMAL HIGH (ref 70–99)
POTASSIUM: 2.8 mmol/L — AB (ref 3.5–5.1)
Sodium: 140 mmol/L (ref 135–145)

## 2018-07-09 LAB — TRIGLYCERIDES: Triglycerides: 183 mg/dL — ABNORMAL HIGH (ref <150)

## 2018-07-09 MED ORDER — MIDAZOLAM HCL 2 MG/2ML IJ SOLN
4.0000 mg | Freq: Once | INTRAMUSCULAR | Status: AC
  Administered 2018-07-09: 4 mg via INTRAVENOUS

## 2018-07-09 MED ORDER — PROPOFOL 1000 MG/100ML IV EMUL
5.0000 ug/kg/min | INTRAVENOUS | Status: DC
  Administered 2018-07-09: 25 ug/kg/min via INTRAVENOUS
  Administered 2018-07-09 (×2): 30 ug/kg/min via INTRAVENOUS
  Administered 2018-07-09: 15 ug/kg/min via INTRAVENOUS
  Administered 2018-07-09: 25 ug/kg/min via INTRAVENOUS
  Administered 2018-07-09: 30 ug/kg/min via INTRAVENOUS
  Administered 2018-07-10: 35 ug/kg/min via INTRAVENOUS
  Administered 2018-07-10: 40 ug/kg/min via INTRAVENOUS
  Administered 2018-07-10: 35 ug/kg/min via INTRAVENOUS
  Administered 2018-07-10: 20 ug/kg/min via INTRAVENOUS
  Administered 2018-07-10: 50 ug/kg/min via INTRAVENOUS
  Administered 2018-07-10: 30 ug/kg/min via INTRAVENOUS
  Administered 2018-07-11: 35 ug/kg/min via INTRAVENOUS
  Administered 2018-07-11 (×2): 30 ug/kg/min via INTRAVENOUS
  Administered 2018-07-11: 40 ug/kg/min via INTRAVENOUS
  Administered 2018-07-11: 35 ug/kg/min via INTRAVENOUS
  Administered 2018-07-11 – 2018-07-12 (×2): 30 ug/kg/min via INTRAVENOUS
  Administered 2018-07-12 – 2018-07-13 (×5): 25 ug/kg/min via INTRAVENOUS
  Filled 2018-07-09 (×24): qty 100

## 2018-07-09 MED ORDER — PROPOFOL 1000 MG/100ML IV EMUL
INTRAVENOUS | Status: AC
  Administered 2018-07-09: 15 ug/kg/min via INTRAVENOUS
  Filled 2018-07-09: qty 100

## 2018-07-09 MED ORDER — FENTANYL CITRATE (PF) 100 MCG/2ML IJ SOLN
100.0000 ug | INTRAMUSCULAR | Status: DC | PRN
  Administered 2018-07-09 – 2018-07-13 (×7): 100 ug via INTRAVENOUS
  Filled 2018-07-09: qty 2

## 2018-07-09 MED ORDER — POTASSIUM CHLORIDE 10 MEQ/50ML IV SOLN
10.0000 meq | INTRAVENOUS | Status: AC
  Administered 2018-07-09 (×6): 10 meq via INTRAVENOUS
  Filled 2018-07-09 (×6): qty 50

## 2018-07-09 MED ORDER — FENTANYL CITRATE (PF) 100 MCG/2ML IJ SOLN
100.0000 ug | INTRAMUSCULAR | Status: DC | PRN
  Administered 2018-07-09 (×2): 100 ug via INTRAVENOUS
  Filled 2018-07-09 (×2): qty 2

## 2018-07-09 MED ORDER — FENTANYL 2500MCG IN NS 250ML (10MCG/ML) PREMIX INFUSION
25.0000 ug/h | INTRAVENOUS | Status: DC
  Administered 2018-07-10: 50 ug/h via INTRAVENOUS
  Administered 2018-07-11 – 2018-07-12 (×2): 100 ug/h via INTRAVENOUS
  Filled 2018-07-09 (×4): qty 250

## 2018-07-09 MED ORDER — QUETIAPINE FUMARATE 25 MG PO TABS
25.0000 mg | ORAL_TABLET | Freq: Two times a day (BID) | ORAL | Status: DC
  Administered 2018-07-09: 25 mg
  Filled 2018-07-09: qty 1

## 2018-07-09 NOTE — Progress Notes (Signed)
Renal function labwork showed a K+ of 4. Only 3 of 6 potassium runs completed to replace the K+ 2.8 from earlier labwork. Notified Dr. Jimmy Footman, who instructed to finish running the remaining 3 runs of potassium.

## 2018-07-09 NOTE — Progress Notes (Signed)
[place] for argatroban Indication: cool hand, r/o HIT  Patient Measurements: Height: 6\' 2"  (188 cm) Weight: (unable to get weight - bed malfunctioning) IBW/kg (Calculated) : 82.2  Vital Signs: Temp: 98.2 F (36.8 C) (11/03 1200) Temp Source: Axillary (11/03 1200) BP: 107/76 (11/03 1400) Pulse Rate: 96 (11/03 1400)  Labs: Recent Labs    07/07/18 0628  07/08/18 0016 07/08/18 0446 07/08/18 2359 07/09/18 0340  HGB 9.7*  --   --  8.8*  --  8.3*  HCT 34.7*  --   --  32.6*  --  29.4*  PLT 44*  --   --  53*  --  67*  APTT  --   --  56* 58*  --  59*  CREATININE 2.65*   < >  --  1.83* 1.39* 1.35*   < > = values in this interval not displayed.   Assessment: 53 yoM initially on heparin SQ for VTE prophylaxis with platelet drop to 41. 4T score at least 5. HIT antibody positive, OD 2.7. SRA in process. Patient with dusky/cool hand per MD assessment but no intervention by vascular at this point. Hgb now 8.3, pltc slowly increasing now 67. aPTT remains therapeutic this AM at 59. No bleeding noted.   Goal of Therapy:  aPTT 50-90 seconds Monitor platelets by anticoagulation protocol: Yes   Plan:  Continue argatroban at 0.6 mcg/kg/min  Daily aPTT/CBC F/u SRA result  Erin N. Gerarda Fraction, PharmD PGY2 Infectious Diseases Pharmacy Resident Phone: [phone] 07/09/2018,2:22 PM

## 2018-07-09 NOTE — Progress Notes (Signed)
[facility] Progress Note    Assessment/ Plan:   MS- lithium level 2.37 and crt of 1.75    1. Renal/Hypernatremia - HD times one on 10/18 for lithium toxicity, level normalized. Then discovered to have BOO s/p foley -then polyuria and hypernatremia either due to post obs diuresis or nephrogenic DI from lithium (when pt lucid he did report longstanding thirst so think is neph DI).  -Needs insulin gtt with high rates of D5W when restarted  -Continue free watervia OGT to223m q4hr as we don't want to get behind  - Unable to give NSAID bec of the AKI; Metolazone or thiazide usually would help but not likely in pt with compromised renal function.  - Poor or no response to high dose desmopressin IN x2 doses; no need to rechallenge   - Will stop CRRT tomorrow AM (started  to help correct metabolic abnormalities and also for possible uremia causing the encephalopathy) -> if encephalopathy not improved this is not from uremia  -> will need to restart D5W (start at 150 ml/hr but likely will need to increase) and insulin gtt when off CRRT  Pt seen on CRRT RIJ temp cath Filter about to clot off twice last night (at least 4x already); pt on systemic Argatroban. 4K bath Pre/post/Qd 500/300/1500 UF net 0  2. HTN/vol-Actually BP is now on low side. 3. Anemia- not an issue 4. ID-Cultures and rocephin per primary- penile drainage enterococcus faecalis and staph lugdunensis on cefepime, flagyl -> fluconazone and unasyn  MRI neg  6. Hypokalemia- repleting(3.4) 7. Psych- behaviors requiring transfer to ICU and IV sedation  Subjective:   On ventbut very agitated req sedation.  Filter clotted off twice last night. UOP very good; on Levo and Vasopressin.   Objective:   BP 126/86   Pulse 65   Temp 98.1 F (36.7 C) (Axillary)   Resp (!) 25   Ht _0  (1.88 m)   Wt (!) 139.2 kg   SpO2 97%   BMI 39.40 kg/m   Intake/Output Summary (Last 24 hours) at  07/09/2018 0726 Last data filed at 07/09/2018 0700 Gross per 24 hour  Intake 5934.35 ml  Output 6610 ml  Net -675.65 ml   Weight change:   Physical Exam: General:resting, restraints Heart: RRR Lungs: clear Abdomen: soft, non tender, decr freq Extremities:1+edema Dialysis Access: had right IJ temp cath   Imaging: Mr Brain Wo Contrast  Result Date: 07/07/2018 CLINICAL DATA:  Persistent encephalopathy EXAM: MRI HEAD WITHOUT CONTRAST TECHNIQUE: Multiplanar, multiecho pulse sequences of the brain and surrounding structures were obtained without intravenous contrast. COMPARISON:  Head CT 07/04/2018 FINDINGS: Brain: No acute infarction, hemorrhage, hydrocephalus, extra-axial collection or mass lesion. Vascular: Major flow voids are preserved Skull and upper cervical spine: Low marrow signal in the cervical spine and partially in the clivus. No superimposed clival sclerosis by CT to suggest this is metastatic disease in this patient with history of prostate cancer. Sinuses/Orbits: Nasal cavity and nasopharyngeal fluid in the setting of intubation. Small bilateral mastoid fluid levels are likely related to the same. IMPRESSION: 1. Unremarkable MRI of the brain. No explanation for encephalopathy. 2. Low marrow signal, often red marrow reconversion, please correlate with CBC. Electronically Signed   By: JMonte FantasiaM.D.   On: 07/07/2018 11:54   Dg Chest Port 1 View  Result Date: 07/07/2018 CLINICAL DATA:  Dialysis catheter placement EXAM: PORTABLE CHEST 1 VIEW COMPARISON:  07/05/2018 FINDINGS: Right jugular dual lumen catheter tip in the lower SVC. No  pneumothorax. Left jugular catheter tip in the proximal SVC. Endotracheal tube in good position. NG tube in place with the tip not visualized Negative for edema.  Bibasilar atelectasis/infiltrate unchanged. IMPRESSION: Satisfactory dialysis catheter placement Bibasilar atelectasis/infiltrate unchanged Electronically Signed   By: Franchot Gallo M.D.    On: 07/07/2018 16:10    Labs: [employer] Recent Labs  Lab 07/03/18 3704 07/03/18 1612 07/04/18 0520 07/05/18 0544  07/07/18 0510 07/07/18 8889 07/07/18 0845 07/07/18 1618 07/08/18 0446 07/08/18 2359 07/09/18 0340  NA  --   --  148* 152*   < > 153* 151* 152* 154* 149* 140 141  K  --   --  3.5 3.6   < > 3.4* 3.2* 3.4* 3.1* 3.2* 2.8* 4.0  CL  --   --  123* 125*   < > 130* 128* 130* >130* 121* 108 112*  CO2  --   --  19* 18*   < > 19* 18* 20* _0 GLUCOSE  --   --  392* 329*   < > 234* 221* 240* 224* 176* 229* 147*  BUN  --   --  45* 41*   < > 36* 35* 33* 34* 27* 25* 23*  CREATININE  --   --  3.22* 2.88*   < > 2.65* 2.65* 2.63* 2.51* 1.83* 1.39* 1.35*  CALCIUM  --   --  10.0 10.3   < > 10.0 9.9 10.0 9.9 9.3 9.1 9.4  PHOS 4.8* 4.4 4.1 3.4  --   --   --   --  2.5 2.8  --  2.6   < > = values in this interval not displayed.   CBC Recent Labs  Lab 07/04/18 0520 07/05/18 0544  07/07/18 0510 07/07/18 0628 07/08/18 0446 07/09/18 0340  WBC 12.5* 18.0*   < > 13.5* 13.2* 13.0* 15.1*  NEUTROABS 10.5* 14.8*  --   --   --   --   --   HGB 10.9* 10.6*   < > 9.6* 9.7* 8.8* 8.3*  HCT 37.3* 37.4*   < > 35.3* 34.7* 32.6* 29.4*  MCV 104.8* 107.5*   < > 108.0* 108.1* 107.9* 103.9*  PLT 77* 69*   < > 49* 44* 53* 67*   < > = values in this interval not displayed.    Medications:    . aspirin  81 mg Per Tube Daily  . chlorhexidine gluconate (MEDLINE KIT)  15 mL Mouth Rinse BID  . citalopram  10 mg Per Tube Daily  . clonazepam  0.25 mg Per Tube TID  . feeding supplement (PRO-STAT SUGAR FREE 64)  60 mL Per Tube BID  . finasteride  5 mg Oral Daily  . folic acid  1 mg Intravenous Daily  . free water  250 mL Per Tube Q4H  . lamoTRIgine  200 mg Per Tube QHS  . mouth rinse  15 mL Mouth Rinse 10 times per day  . multivitamin  15 mL Per Tube Daily  . nystatin   Topical BID  . pantoprazole sodium  40 mg Per Tube Daily  . QUEtiapine  25 mg Per Tube QHS  . sodium chloride flush  3 mL  Intravenous Q12H  . tamsulosin  0.8 mg Oral Daily  . thiamine injection  100 mg Intravenous Q24H      Otelia Santee, MD 07/09/2018, 7:26 AM

## 2018-07-09 NOTE — Progress Notes (Signed)
[facility] Progress Note Patient Name: Frank Cox DOB: 1961-08-31 MRN: [id number]   Date of Service  07/09/2018  HPI/Events of Note  hypokalemia  eICU Interventions  Potassium replaced     Intervention Category Intermediate Interventions: Electrolyte abnormality - evaluation and management  DETERDING,ELIZABETH 07/09/2018, 1:33 AM

## 2018-07-09 NOTE — Progress Notes (Signed)
NAME:  Frank Cox, MRN:  [id number], DOB:  Aug 30, 1961, LOS: 6 ADMISSION DATE:  06/22/2018, CONSULTATION DATE:  06/19/2018 REFERRING MD:  Stark Jock, ED [facility], CHIEF COMPLAINT:  Alerted mental status.  Brief History   57 year old man who requires urgent hemodialysis for acute on chronic lithium toxicity. Has been increasingly confused, experiencing multiple falls over the last week.  Li level 2.37 (upper limit 1.2) and nephrology wishes to dialyze the patient.   He was transferred out of ICU on 10/20.  On 10/24, he had agitation that did not respond to ativan or haldol; therefore, PCCM was called back for transfer to ICU.  Past Medical History  Bipolar affective disorder, T2DM on OHA, HTN, Prostate cancer, OSA on CPAP.  Significant Hospital Events   10/18 Admit with acute renal failure (cr 1.61) Foley catheter > 2 L immediately.  Underwent emergent dialysis for lithium toxicity 10/19 Lithium normalized.   10/20 Tx to SDU 10/24 Tx back to ICU due to agitation 10/26 PCCM called back for AMS, intermittent agitation 10/28 Intubated   Consults: date of consult/date signed off & final recs:  Nephrology 10/18 - recommending HD Poison Control - recommending hydration, following serial Li levels. Psych 10/19 - do not restart lithium, needs psych consult once encephalopathy clears, recommending inpatient psych admission once medically cleared Urology 10/19 - input regarding possible obstructive uropathy, they recommend keeping foley in and follow up as outpatient  Procedures (surgical and bedside):  10/29 [facility] CVC  11/1 [facility] HD Cath, started CRRT   Significant Diagnostic Tests:  CT head 10/15 > negative. Renal US 10/18 > diabetic glomerular nephropathy.  No obstruction.  Bilateral benign appearing renal cyts.  CT Head 10/29 >> Neg for acute process, enlarge orbital vein? CT Abd Pelvis 10/29 >> Negative for infectious source, fluid in colon   EEG 10/29 >>> slowing, no seizure   MRI Brain 11/1  >>> unremarkable   Micro Data:  Urine 10/18 > negative Blood 10/20 > negative Blood 10/23 > negative  Urine 10/23 > negative Penile culture 10/22 >> staph lugdunensis (S-, entercoccus (vanco sens)  UCx 10/28 >> NGTD  Blood 10/28 >> NGTD, X1 with Meth resistant staph prb contaminant   Trach Asp 10/28 >> NGTD    Antimicrobials:  Zosyn 10/20 > 10/21 Ceftriaxone 10/21 > 10/23 Unasyn 10/26 >>> Oral Vanco 10/28, c diff neg, stopped   Cefepime 10/28 Vanco 10/28 Metronidazole 10/28   Fluconazole 10/29  Subjective:  No events overnight, tolerating off pressors  Objective   Blood pressure (!) 100/57, pulse 84, temperature 98.2 F (36.8 C), temperature source Axillary, resp. rate (!) 25, height 6\' 2"  (1.88 m), weight (!) 139.2 kg, SpO2 97 %.    Vent Mode: PRVC FiO2 (%):  [40 %] 40 % Set Rate:  [24 bmp] 24 bmp Vt Set:  [650 mL] 650 mL PEEP:  [8 cmH20] 8 cmH20 Plateau Pressure:  [17 cmH20-22 cmH20] 21 cmH20   Intake/Output Summary (Last 24 hours) at 07/09/2018 1322 Last data filed at 07/09/2018 1300 Gross per 24 hour  Intake 5658.43 ml  Output 7391 ml  Net -1732.57 ml   Filed Weights   06/29/18 1350 07/04/18 0500  Weight: (!) 139.6 kg (!) 139.2 kg   Examination: General appearance: Chronically ill appearing male, NAD HEENT: [place]/AT, PERRL, EOM-I and MMM Lung: coarse BS diffusely CV: RRR, Nl S1/S2 and -M/R/G Abdomen: Soft, NT, ND and +BS, obese  Extremities: 2+ peripheral edema, radial and DP pulses present bilaterally , left  hand index and middle finger distal tip purple   Skin: distal tips cool  Neuro: moving all ext spontaneously   Assessment & Plan:   Septic Shock, unclear source, possible aspiration after being obtunded due to AMS, small right sided infiltrate, could represent atelectasis, placed, fever 103F, prior cx with staph and enterococcus from penile discharge, one blood cx +methicillin resistant staph species(suspect contaminate)    - Fever Tmax 103F, ?drug  induced, fever improved  - c diff neg, normal cortisol   P: Continue unasyn, fluconazole and vanc, ID to decide abx use. D/C Norepi D/C vasopressin  KVO IVF  Acute Hypoxemic Hypercarbic Respiratory Failure  Hypercarbia was likely due to AMS  P: Change vent for ABG Hold of weaning for now Likely will need trach next week if unable to wean  Acute toxic, metabolic encephalopathy secondary to above  Agitated Delirium   - EEG with no seizure  - Think this is primarily due to medication and metabolic derrangements  P: - Change seroquel to BID - Low dose propofol  Acute on Chronic Lithium Toxicity with Acute Toxic Encephalopathy  - s/p HD on 10/18 with normalization of Li levels. P: - Concern for ongoing nephrogenic DI, monitor I/O and Na closely  Bipolar Disorder  - patient denied suicidal intent.   - S/p psych eval 10/19 P: Home pta meds have been reduced  Lamictal down to 1/2 dose At risk for seizure if we withdrawal drugs too quickly   Probable OSA / OHS P: Intubated Will likely need trach  Acute on Chronic Renal Failure, Stage 2 CKD Nephrogenic DI from lithium Hypertnatremia, FWD, Na+ load given Hyperchloremia Hypokalemia   P: CRRT per renal, -50 ml/hr as BP allows BMET in AM Replace electrolytes as indicated   Bladder outlet obstruction  -s/p foley, patient of Dr. Tresa Moore, h/o of radiation tx for prostate Ca - spoke with urology, ok for nursing to change foley routine  P: - Observe, follow up with urology   Macrocytic Anemia  - LDH mildly elevated, no schistocytes, burr cells can be seen in renal disease  - ?ICU related BM suppression, sepsis associated - B12 normal, retics normal  P: Following no intervention at this time  Plan to change to HD in AM   Thrombocytopenia Positive HIT Ab Possible HIT, SRA pending  P: Observing platelet level  On argatroban   DM Hyperglycemia  P: Titrate insulin ggt to maintain goal BGL   Dusky left index and  middle finger Observing We appreciate vascular surgery input  titrating argatroban to maintain goal ptt Avoid phenylephrine  Disposition / Summary of Today's Plan 07/09/18   See above    Diet: TF  Pain/Anxiety/Delirium protocol (if indicated): Yes  VAP protocol (if indicated): Yes  DVT prophylaxis: argatroban  GI prophylaxis: N/A. Hyperglycemia protocol: SSI Mobility: Bedrest. Code Status: full. Family Communication: No family has visited   Labs (personally reviewed)  CBC: Recent Labs  Lab 07/04/18 0520 07/05/18 0544 07/06/18 0856 07/07/18 0510 07/07/18 0628 07/08/18 0446 07/09/18 0340  WBC 12.5* 18.0* 12.5* 13.5* 13.2* 13.0* 15.1*  NEUTROABS 10.5* 14.8*  --   --   --   --   --   HGB 10.9* 10.6* 9.6* 9.6* 9.7* 8.8* 8.3*  HCT 37.3* 37.4* 33.5* 35.3* 34.7* 32.6* 29.4*  MCV 104.8* 107.5* 107.0* 108.0* 108.1* 107.9* 103.9*  PLT 77* 69* 41* 49* 44* 53* 67*    Basic Metabolic Panel: Recent Labs  Lab 07/04/18 0520 07/05/18 0544  07/07/18 0845  07/07/18 1618 07/08/18 0446 07/08/18 2359 07/09/18 0340  NA 148* 152*   < > 152* 154* 149* 140 141  K 3.5 3.6   < > 3.4* 3.1* 3.2* 2.8* 4.0  CL 123* 125*   < > 130* >130* 121* 108 112*  CO2 19* 18*   < > 20* 22 22 24 25   GLUCOSE 392* 329*   < > 240* 224* 176* 229* 147*  BUN 45* 41*   < > 33* 34* 27* 25* 23*  CREATININE 3.22* 2.88*   < > 2.63* 2.51* 1.83* 1.39* 1.35*  CALCIUM 10.0 10.3   < > 10.0 9.9 9.3 9.1 9.4  MG 2.6* 2.7*  --   --   --  2.4 2.3 2.5*  PHOS 4.1 3.4  --   --  2.5 2.8  --  2.6   < > = values in this interval not displayed.   GFR: Estimated Creatinine Clearance: 88.6 mL/min (A) (by C-G formula based on SCr of 1.35 mg/dL (H)). Recent Labs  Lab 07/03/18 0710  07/07/18 0510 07/07/18 0628 07/08/18 0446 07/09/18 0340  PROCALCITON 0.47  --   --   --   --   --   WBC 10.0   < > 13.5* 13.2* 13.0* 15.1*   < > = values in this interval not displayed.    Liver Function Tests: Recent Labs  Lab  07/04/18 0520 07/05/18 0544 07/07/18 1618 07/08/18 0446 07/09/18 0340  AST 22 21  --   --   --   ALT 24 22  --   --   --   ALKPHOS 48 45  --   --   --   BILITOT 0.6 0.5  --   --   --   PROT 6.2* 6.0*  --   --   --   ALBUMIN 2.7* 2.6* 2.3* 2.3* 2.3*   No results for input(s): LIPASE, AMYLASE in the last 168 hours. No results for input(s): AMMONIA in the last 168 hours.  ABG    Component Value Date/Time   PHART 7.213 (L) 07/06/2018 1000   PCO2ART 41.4 07/06/2018 1000   PO2ART 57.7 (L) 07/06/2018 1000   HCO3 15.9 (L) 07/06/2018 1000   TCO2 20 (L) 07/03/2018 2130   ACIDBASEDEF 10.4 (H) 07/06/2018 1000   O2SAT 88.2 07/06/2018 1000     Coagulation Profile: Recent Labs  Lab 07/06/18 1156  INR 1.54    Cardiac Enzymes: No results for input(s): CKTOTAL, CKMB, CKMBINDEX, TROPONINI in the last 168 hours.  HbA1C: Hemoglobin A1C  Date/Time Value Ref Range Status  08/25/2015 9.9  Final   Hgb A1c MFr Bld  Date/Time Value Ref Range Status  07/19/2017 08:30 AM 5.5 4.6 - 6.5 % Final    Comment:    Glycemic Control Guidelines for People with Diabetes:Non Diabetic:  <6%Goal of Therapy: <7%Additional Action Suggested:  >8%   04/21/2017 10:13 AM 5.7 4.6 - 6.5 % Final    Comment:    Glycemic Control Guidelines for People with Diabetes:Non Diabetic:  <6%Goal of Therapy: <7%Additional Action Suggested:  >8%     CBG: Recent Labs  Lab 07/09/18 0814 07/09/18 0906 07/09/18 1009 07/09/18 1114 07/09/18 1203  GLUCAP 156* 150* 171* 203* 189*   The patient is critically ill with multiple organ systems failure and requires high complexity decision making for assessment and support, frequent evaluation and titration of therapies, application of advanced monitoring technologies and extensive interpretation of multiple databases.   Critical Care Time devoted  to patient care services described in this note is  32  Minutes. This time reflects time of care of this signee Dr Jennet Maduro. This  critical care time does not reflect procedure time, or teaching time or supervisory time of PA/NP/Med student/Med Resident etc but could involve care discussion time.  Rush Farmer, M.D. [facility] Pulmonary/Critical Care Medicine. Pager: [phone]. After hours pager: [phone].

## 2018-07-10 ENCOUNTER — Encounter (HOSPITAL_COMMUNITY): Payer: Self-pay

## 2018-07-10 ENCOUNTER — Inpatient Hospital Stay (HOSPITAL_COMMUNITY): Payer: 59

## 2018-07-10 DIAGNOSIS — T56891D Toxic effect of other metals, accidental (unintentional), subsequent encounter: Secondary | ICD-10-CM

## 2018-07-10 LAB — GLUCOSE, CAPILLARY
GLUCOSE-CAPILLARY: 139 mg/dL — AB (ref 70–99)
GLUCOSE-CAPILLARY: 140 mg/dL — AB (ref 70–99)
GLUCOSE-CAPILLARY: 151 mg/dL — AB (ref 70–99)
GLUCOSE-CAPILLARY: 157 mg/dL — AB (ref 70–99)
GLUCOSE-CAPILLARY: 171 mg/dL — AB (ref 70–99)
GLUCOSE-CAPILLARY: 172 mg/dL — AB (ref 70–99)
GLUCOSE-CAPILLARY: 182 mg/dL — AB (ref 70–99)
GLUCOSE-CAPILLARY: 200 mg/dL — AB (ref 70–99)
GLUCOSE-CAPILLARY: 217 mg/dL — AB (ref 70–99)
GLUCOSE-CAPILLARY: 222 mg/dL — AB (ref 70–99)
Glucose-Capillary: 173 mg/dL — ABNORMAL HIGH (ref 70–99)
Glucose-Capillary: 188 mg/dL — ABNORMAL HIGH (ref 70–99)
Glucose-Capillary: 157 mg/dL — ABNORMAL HIGH (ref 70–99)
Glucose-Capillary: 184 mg/dL — ABNORMAL HIGH (ref 70–99)
Glucose-Capillary: 226 mg/dL — ABNORMAL HIGH (ref 70–99)
Glucose-Capillary: 192 mg/dL — ABNORMAL HIGH (ref 70–99)
Glucose-Capillary: 228 mg/dL — ABNORMAL HIGH (ref 70–99)
Glucose-Capillary: 148 mg/dL — ABNORMAL HIGH (ref 70–99)
Glucose-Capillary: 130 mg/dL — ABNORMAL HIGH (ref 70–99)
Glucose-Capillary: 145 mg/dL — ABNORMAL HIGH (ref 70–99)
Glucose-Capillary: 148 mg/dL — ABNORMAL HIGH (ref 70–99)

## 2018-07-10 LAB — CBC
HEMATOCRIT: 31.6 % — AB (ref 39.0–52.0)
Hemoglobin: 8.9 g/dL — ABNORMAL LOW (ref 13.0–17.0)
MCH: 29.5 pg (ref 26.0–34.0)
MCHC: 28.2 g/dL — AB (ref 30.0–36.0)
MCV: 104.6 fL — AB (ref 80.0–100.0)
Platelets: 89 10*3/uL — ABNORMAL LOW (ref 150–400)
RBC: 3.02 MIL/uL — ABNORMAL LOW (ref 4.22–5.81)
RDW: 14.6 % (ref 11.5–15.5)
WBC: 15.1 10*3/uL — AB (ref 4.0–10.5)
nRBC: 2.6 % — ABNORMAL HIGH (ref 0.0–0.2)

## 2018-07-10 LAB — BASIC METABOLIC PANEL
ANION GAP: 6 (ref 5–15)
BUN: 19 mg/dL (ref 6–20)
CALCIUM: 9.5 mg/dL (ref 8.9–10.3)
CO2: 26 mmol/L (ref 22–32)
CREATININE: 1.24 mg/dL (ref 0.61–1.24)
Chloride: 113 mmol/L — ABNORMAL HIGH (ref 98–111)
GFR calc Af Amer: >60 mL/min (ref >60)
GLUCOSE: 196 mg/dL — AB (ref 70–99)
POTASSIUM: 3.3 mmol/L — AB (ref 3.5–5.1)
Sodium: 145 mmol/L (ref 135–145)

## 2018-07-10 LAB — POCT I-STAT 3, ART BLOOD GAS (G3+)
Bicarbonate: 25.5 mmol/L (ref 20.0–28.0)
O2 SAT: 94 %
PO2 ART: 72 mmHg — AB (ref 83.0–108.0)
TCO2: 27 mmol/L (ref 22–32)
pCO2 arterial: 42.5 mmHg (ref 32.0–48.0)
pH, Arterial: 7.386 (ref 7.350–7.450)

## 2018-07-10 LAB — APTT: aPTT: 50 seconds — ABNORMAL HIGH (ref 24–36)

## 2018-07-10 LAB — RENAL FUNCTION PANEL
ALBUMIN: 2.3 g/dL — AB (ref 3.5–5.0)
Anion gap: 8 (ref 5–15)
BUN: 19 mg/dL (ref 6–20)
CHLORIDE: 112 mmol/L — AB (ref 98–111)
CO2: 26 mmol/L (ref 22–32)
Calcium: 9.5 mg/dL (ref 8.9–10.3)
Creatinine, Ser: 1.22 mg/dL (ref 0.61–1.24)
GFR calc Af Amer: >60 mL/min (ref >60)
GFR calc non Af Amer: >60 mL/min (ref >60)
GLUCOSE: 196 mg/dL — AB (ref 70–99)
POTASSIUM: 3.4 mmol/L — AB (ref 3.5–5.1)
Phosphorus: 2.4 mg/dL — ABNORMAL LOW (ref 2.5–4.6)
Sodium: 146 mmol/L — ABNORMAL HIGH (ref 135–145)

## 2018-07-10 LAB — PHOSPHORUS: Phosphorus: 2.4 mg/dL — ABNORMAL LOW (ref 2.5–4.6)

## 2018-07-10 LAB — BLOOD GAS, ARTERIAL
Acid-Base Excess: 2.9 mmol/L — ABNORMAL HIGH (ref 0.0–2.0)
Bicarbonate: 26.7 mmol/L (ref 20.0–28.0)
DRAWN BY: 51155
FIO2: 40
MECHVT: 680 mL
O2 Saturation: 97.8 %
PEEP: 8 cmH2O
PH ART: 7.442 (ref 7.350–7.450)
Patient temperature: 98.6
RATE: 24 resp/min
pCO2 arterial: 39.8 mmHg (ref 32.0–48.0)
pO2, Arterial: 90.7 mmHg (ref 83.0–108.0)

## 2018-07-10 LAB — NA AND K (SODIUM & POTASSIUM), RAND UR
POTASSIUM UR: 5 mmol/L
Sodium, Ur: 16 mmol/L

## 2018-07-10 LAB — MAGNESIUM: Magnesium: 2.7 mg/dL — ABNORMAL HIGH (ref 1.7–2.4)

## 2018-07-10 LAB — OSMOLALITY, URINE: OSMOLALITY UR: 149 mosm/kg — AB (ref 300–900)

## 2018-07-10 MED ORDER — ANTICOAGULANT SODIUM CITRATE 4% (200MG/5ML) IV SOLN
5.0000 mL | Freq: Once | Status: AC
  Administered 2018-07-10: 5 mL via INTRAVENOUS
  Filled 2018-07-10: qty 5

## 2018-07-10 MED ORDER — CLONAZEPAM 0.5 MG PO TBDP
0.5000 mg | ORAL_TABLET | Freq: Two times a day (BID) | ORAL | Status: DC
  Administered 2018-07-10 – 2018-07-11 (×4): 0.5 mg
  Filled 2018-07-10 (×4): qty 1

## 2018-07-10 MED ORDER — QUETIAPINE FUMARATE 25 MG PO TABS
50.0000 mg | ORAL_TABLET | Freq: Two times a day (BID) | ORAL | Status: DC
  Administered 2018-07-10 – 2018-07-11 (×4): 50 mg
  Filled 2018-07-10 (×4): qty 2

## 2018-07-10 MED ORDER — FUROSEMIDE 10 MG/ML IJ SOLN
40.0000 mg | Freq: Once | INTRAMUSCULAR | Status: AC
  Administered 2018-07-10: 40 mg via INTRAVENOUS
  Filled 2018-07-10: qty 4

## 2018-07-10 MED ORDER — POTASSIUM CHLORIDE 20 MEQ PO PACK
40.0000 meq | PACK | Freq: Once | ORAL | Status: AC
  Administered 2018-07-10: 40 meq
  Filled 2018-07-10: qty 2

## 2018-07-10 MED ORDER — FREE WATER
400.0000 mL | Status: DC
  Administered 2018-07-10 – 2018-07-14 (×24): 400 mL

## 2018-07-10 MED ORDER — DEXTROSE 5 % IV SOLN
INTRAVENOUS | Status: DC
  Administered 2018-07-10 – 2018-07-15 (×16): via INTRAVENOUS

## 2018-07-10 MED ORDER — ANTICOAGULANT SODIUM CITRATE LOCK FLUSH 4% (120 MG/3ML) IV SOLN: 2.8000 mL | PREFILLED_SYRINGE | Freq: Once | INTRAVENOUS | Status: DC

## 2018-07-10 NOTE — Plan of Care (Signed)
°  Problem: Coping: °Goal: Level of anxiety will decrease °Outcome: Progressing °  °

## 2018-07-10 NOTE — Consult Note (Signed)
WOC consult requested for bilat groin/pannus areas.  Pt has red moist macerated areas with patchy partial thickness skin loss.  Appearance is consistent with intertrigo.  Pt is currently being treated with antifungal powder and locations are beginning to dry and heal.  Agree with current plan of care.  No family members present to discuss plan of care and pt is critically ill on the ventilator. Please re-consult if further assistance is needed.  Thank-you,  Julien Girt MSN, [place], [place], [place], [place]

## 2018-07-10 NOTE — Progress Notes (Signed)
Paged Dr Lamonte Sakai regarding patient's SP02 decreasing, heart rate in the 40's and irregular breathing pattern.  Was told to increase propofol to maximum dose, give 4 mg Versed and a fentanyl bolus to see if respiratory pattern would improve.  Will continue to monitor  Hiram Gash RN

## 2018-07-10 NOTE — Progress Notes (Signed)
[facility] for Infectious Disease   Reason for visit: Follow up on fever  Interval History: remains afebrile since 11/1, WBC stable at 15.1, remains encephalopathic.  No new positive cultures.   Physical Exam: Constitutional:  Vitals:   07/10/18 0735 07/10/18 0800  BP: 133/77   Pulse: (!) 108   Resp: (!) 29   Temp:  99.9 F (37.7 C)  SpO2: 99%    patient is intubated and encephalopathic Eyes: anicteric HENT: +ET Respiratory: respiratory effort on vent; + rhonchi Cardiovascular: tachy RR GI: soft, nt, nd MS: + edema  Review of Systems: Unable to be assessed due to patient factors  Lab Results  Component Value Date   WBC 15.1 (H) 07/10/2018   HGB 8.9 (L) 07/10/2018   HCT 31.6 (L) 07/10/2018   MCV 104.6 (H) 07/10/2018   PLT 89 (L) 07/10/2018    Lab Results  Component Value Date   CREATININE 1.22 07/10/2018   CREATININE 1.24 07/10/2018   BUN 19 07/10/2018   BUN 19 07/10/2018   NA 146 (H) 07/10/2018   NA 145 07/10/2018   K 3.4 (L) 07/10/2018   K 3.3 (L) 07/10/2018   CL 112 (H) 07/10/2018   CL 113 (H) 07/10/2018   CO2 26 07/10/2018   CO2 26 07/10/2018    Lab Results  Component Value Date   ALT 22 07/05/2018   AST 21 07/05/2018   ALKPHOS 45 07/05/2018     Microbiology: Recent Results (from the past 240 hour(s))  Culture, respiratory (non-expectorated)     Status: None   Collection Time: 07/03/18  8:29 AM  Result Value Ref Range Status   Specimen Description TRACHEAL ASPIRATE  Final   Special Requests NONE  Final   Gram Stain   Final    RARE WBC PRESENT,BOTH PMN AND MONONUCLEAR NO ORGANISMS SEEN    Culture   Final    RARE Consistent with normal respiratory flora. Performed at [facility], [place] [address], [place], [place] [address]    Report Status 07/05/2018 FINAL  Final  Culture, Urine     Status: None   Collection Time: 07/03/18  9:06 AM  Result Value Ref Range Status   Specimen Description URINE, RANDOM  Final   Special Requests  NONE  Final   Culture   Final    NO GROWTH Performed at [facility], [place] [address], [place], [place] [address]    Report Status 07/04/2018 FINAL  Final  C difficile quick scan w PCR reflex     Status: None   Collection Time: 07/03/18  9:49 AM  Result Value Ref Range Status   C Diff antigen NEGATIVE NEGATIVE Final   C Diff toxin NEGATIVE NEGATIVE Final   C Diff interpretation No C. difficile detected.  Final    Comment: Performed at [facility], [place] [address], [place], [place] [address]  Culture, blood (routine x 2)     Status: None   Collection Time: 07/03/18  9:53 AM  Result Value Ref Range Status   Specimen Description BLOOD BLOOD RIGHT HAND  Final   Special Requests   Final    BOTTLES DRAWN AEROBIC AND ANAEROBIC Blood Culture results may not be optimal due to an inadequate volume of blood received in culture bottles   Culture   Final    NO GROWTH 5 DAYS Performed at [facility], [place] [address], [place], [place] [address]    Report Status 07/08/2018  FINAL  Final  Culture, blood (routine x 2)     Status: Abnormal   Collection Time: 07/03/18  9:53 AM  Result Value Ref Range Status   Specimen Description BLOOD BLOOD LEFT HAND  Final   Special Requests   Final    BOTTLES DRAWN AEROBIC AND ANAEROBIC Blood Culture results may not be optimal due to an inadequate volume of blood received in culture bottles   Culture  Setup Time   Final    GRAM POSITIVE COCCI AEROBIC BOTTLE ONLY CRITICAL RESULT CALLED TO, READ BACK BY AND VERIFIED WITH: A. Masters PharmD 17:20 07/06/18 ([email or web address])    Culture (A)  Final    STAPHYLOCOCCUS SPECIES (COAGULASE NEGATIVE) THE SIGNIFICANCE OF ISOLATING THIS ORGANISM FROM A SINGLE SET OF BLOOD CULTURES WHEN MULTIPLE SETS ARE DRAWN IS UNCERTAIN. PLEASE NOTIFY THE MICROBIOLOGY DEPARTMENT WITHIN ONE WEEK IF SPECIATION AND SENSITIVITIES ARE REQUIRED. Performed at [facility], [place] [address], [place], [place] [address]    Report Status  07/07/2018 FINAL  Final  Blood Culture ID Panel (Reflexed)     Status: Abnormal   Collection Time: 07/03/18  9:53 AM  Result Value Ref Range Status   Enterococcus species NOT DETECTED NOT DETECTED Final   Listeria monocytogenes NOT DETECTED NOT DETECTED Final   Staphylococcus species DETECTED (A) NOT DETECTED Final    Comment: Methicillin (oxacillin) resistant coagulase negative staphylococcus. Possible blood culture contaminant (unless isolated from more than one blood culture draw or clinical case suggests pathogenicity). No antibiotic treatment is indicated for blood  culture contaminants. CRITICAL RESULT CALLED TO, READ BACK BY AND VERIFIED WITH: A. Masters PharmD 17:20 07/06/18 ([email or web address])    Staphylococcus aureus (BCID) NOT DETECTED NOT DETECTED Final   Methicillin resistance DETECTED (A) NOT DETECTED Final    Comment: CRITICAL RESULT CALLED TO, READ BACK BY AND VERIFIED WITH: A. Masters PharmD 17:20 07/06/18 ([email or web address])    Streptococcus species NOT DETECTED NOT DETECTED Final   Streptococcus agalactiae NOT DETECTED NOT DETECTED Final   Streptococcus pneumoniae NOT DETECTED NOT DETECTED Final   Streptococcus pyogenes NOT DETECTED NOT DETECTED Final   Acinetobacter baumannii NOT DETECTED NOT DETECTED Final   Enterobacteriaceae species NOT DETECTED NOT DETECTED Final   Enterobacter cloacae complex NOT DETECTED NOT DETECTED Final   Escherichia coli NOT DETECTED NOT DETECTED Final   Klebsiella oxytoca NOT DETECTED NOT DETECTED Final   Klebsiella pneumoniae NOT DETECTED NOT DETECTED Final   Proteus species NOT DETECTED NOT DETECTED Final   Serratia marcescens NOT DETECTED NOT DETECTED Final   Haemophilus influenzae NOT DETECTED NOT DETECTED Final   Neisseria meningitidis NOT DETECTED NOT DETECTED Final   Pseudomonas aeruginosa NOT DETECTED NOT DETECTED Final   Candida albicans NOT DETECTED NOT DETECTED Final   Candida glabrata NOT DETECTED NOT DETECTED Final   Candida krusei NOT  DETECTED NOT DETECTED Final   Candida parapsilosis NOT DETECTED NOT DETECTED Final   Candida tropicalis NOT DETECTED NOT DETECTED Final    Comment: Performed at [facility], [address], [place], [place] [address]    Impression/Plan:  1. Fever - resolved without obvious source.  Will stop amp/sulbatam.    2.  Leukocytosis - stable at 15.    3.  Encephalopathy - no concerns noted on MRI.  Unclear etiology still.   No infectious source identified.  Discussed with Dr. Lamonte Sakai.  Will sign off, please call with any clinical changes. thanks

## 2018-07-10 NOTE — Progress Notes (Addendum)
Fi02 increased to 80%, Spo2 dropped down to 87%.  Sp02 now 92%.

## 2018-07-10 NOTE — Progress Notes (Signed)
[facility] intubated in ICU.   Objective 133/77 (!) 108 99.9 F (37.7 C) (Axillary) (!) 29 99%  Intake/Output Summary (Last 24 hours) at 07/10/2018 0937 Last data filed at 07/10/2018 0800 Gross per 24 hour  Intake 4451.86 ml  Output 6743 ml  Net -2291.14 ml    General Intubated, critically ill Palpable left radial and ulnar pulse at wrist Cyanosis to tip of left 1st, 2nd, 3rd digit Triphasic left palmar arch signal  Laboratory Lab Results: Recent Labs    07/09/18 0340 07/10/18 0541  WBC 15.1* 15.1*  HGB 8.3* 8.9*  HCT 29.4* 31.6*  PLT 67* 89*   BMET Recent Labs    07/09/18 1609 07/10/18 0541  NA 143 145  146*  K 5.1 3.3*  3.4*  CL 111 113*  112*  CO2 27 26  26   GLUCOSE 146* 196*  196*  BUN 22* 19  19  CREATININE 1.26* 1.24  1.22  CALCIUM 9.4 9.5  9.5    COAG Lab Results  Component Value Date   INR 1.54 07/06/2018   INR 1.10 11/17/2015   INR 1.06 09/26/2014   No results found for: PTT  Assessment/Planning: Continue supportive care.  No arterial occlusion on left upper extremity duplex and left radial and ulnar pulse palpable with triphasic palmar arch signal.  Vascular surgery will sign off.  May need finger amputations if fingers continue to progress.  No role for revascularization given exam and duplex findings.    Marty Heck 07/10/2018 9:37 AM --

## 2018-07-10 NOTE — Progress Notes (Signed)
NAME:  Frank Cox, MRN:  [id number], DOB:  09-23-1960, LOS: 28 ADMISSION DATE:  06/16/2018, CONSULTATION DATE:  07/04/2018 REFERRING MD:  Stark Jock, ED [facility], CHIEF COMPLAINT:  Alerted mental status.  Brief History   57 year old man who requires urgent hemodialysis for acute on chronic lithium toxicity. Has been increasingly confused, experiencing multiple falls over the last week.  Li level 2.37 (upper limit 1.2) and nephrology wishes to dialyze the patient.   He was transferred out of ICU on 10/20.  On 10/24, he had agitation that did not respond to ativan or haldol; therefore, PCCM was called back for transfer to ICU.  Past Medical History  Bipolar affective disorder, T2DM on OHA, HTN, Prostate cancer, OSA on CPAP.  Significant Hospital Events   10/18 Admit with acute renal failure (cr 1.61) Foley catheter > 2 L immediately.  Underwent emergent dialysis for lithium toxicity 10/19 Lithium normalized.   10/20 Tx to SDU 10/24 Tx back to ICU due to agitation 10/26 PCCM called back for AMS, intermittent agitation 10/28 Intubated   Consults: date of consult/date signed off & final recs:  Nephrology 10/18 - recommending HD Poison Control - recommending hydration, following serial Li levels. Psych 10/19 - do not restart lithium, needs psych consult once encephalopathy clears, recommending inpatient psych admission once medically cleared Urology 10/19 - input regarding possible obstructive uropathy, they recommend keeping foley in and follow up as outpatient  Procedures (surgical and bedside):  10/29 [facility] CVC  11/1 [facility] HD Cath, started CRRT   Significant Diagnostic Tests:  CT head 10/15 > negative. Renal US 10/18 > diabetic glomerular nephropathy.  No obstruction.  Bilateral benign appearing renal cyts.  CT Head 10/29 >> Neg for acute process, enlarge orbital vein? CT Abd Pelvis 10/29 >> Negative for infectious source, fluid in colon   EEG 10/29 >>> slowing, no seizure   MRI Brain 11/1  >>> unremarkable   Micro Data:  Urine 10/18 > negative Blood 10/20 > negative Blood 10/23 > negative  Urine 10/23 > negative Penile culture 10/22 >> staph lugdunensis (S-, entercoccus (vanco sens)  UCx 10/28 >> NGTD  Blood 10/28 >> NGTD, X1 with Meth resistant staph prb contaminant   Trach Asp 10/28 >> NGTD    Antimicrobials:  Zosyn 10/20 > 10/21 Ceftriaxone 10/21 > 10/23 Unasyn 10/26 >>> Oral Vanco 10/28, c diff neg, stopped   Cefepime 10/28 >> 10/31 Vanco 10/28 >> 10/31 Metronidazole 10/28, stopped Fluconazole 10/29 >>   Subjective:  CVVHD currently off Pressors remain off Tachypnea with attempt to decrease his propofol. Remains on insulin drip, argatroban, propofol, fentanyl  Objective   Blood pressure 132/75, pulse (!) 106, temperature 99.3 F (37.4 C), temperature source Axillary, resp. rate (!) 27, height 6\' 2"  (1.88 m), weight (!) 140.8 kg, SpO2 99 %.    Vent Mode: PRVC FiO2 (%):  [40 %] 40 % Set Rate:  [24 bmp] 24 bmp Vt Set:  [650 mL] 650 mL PEEP:  [8 cmH20] 8 cmH20 Plateau Pressure:  [8 cmH20-24 cmH20] 22 cmH20   Intake/Output Summary (Last 24 hours) at 07/10/2018 0837 Last data filed at 07/10/2018 0700 Gross per 24 hour  Intake 4334.96 ml  Output 6700 ml  Net -2365.04 ml   Filed Weights   06/29/18 1350 07/04/18 0500 07/10/18 0500  Weight: (!) 139.6 kg (!) 139.2 kg (!) 140.8 kg   Examination: General appearance: Ill-appearing obese man, mechanically ventilated HEENT: Endotracheal tube in place, no oral lesions Lung: Coarse bilaterally, no  wheezing CV: Regular, tachycardic, no murmur Abdomen: Obese, soft, positive bowel sounds Extremities: 2+ lower extremity pitting edema, venous stasis changes both lower extremities, left hand second and third fingers with some hypoperfusion change Skin: Cool, no rash Neuro: Pupils react accommodation, light, no cough with suctioning.  Did not follow commands (on propofol)  Assessment & Plan:   Septic Shock,  unclear source, possible aspiration after being obtunded due to AMS, small right sided infiltrate, could represent atelectasis; prior cx with staph and enterococcus from penile discharge, one blood cx +methicillin resistant staph species(suspect contaminant)    No fever last 24 hours P: Pressors weaned to off Appreciate ID assistance, Abx were broadened 10/28 > 10/31, currently on Unasyn, fluconazole day 10.  Question whether we may be able to stop all antibiotics to follow. Enteral vancomycin, IV flagyl stopped with negative C. difficile assessment  Acute Hypoxemic and Hypercarbic Respiratory Failure, principally due to altered mental status and poor airway protection, also some component of interstitial infiltrates P: Goal transition to pressure support ventilation.  Current barriers have been tachypnea and agitation as sedation lifted. Based on his obesity, encephalopathy, he may require tracheostomy in order to facilitate liberation from mechanical ventilation.  We will consider this if no progress  Acute toxic, metabolic encephalopathy secondary to above  Agitated Delirium   - EEG with no seizure  -Metabolic disarray improved, lithium improved.  Suspect that delirium is now enlarged part due to his need for sedating medications P: He is on clonazepam 0.25 mg 3 times daily, will give a slight increase and change to twice daily dosing 11/4. Continue Seroquel, increased to 50 twice daily on 11/4 Current sedation with propofol (bradycardia with Precedex), fentanyl drips  Acute on Chronic Lithium Toxicity with Acute Toxic Encephalopathy  Suspected nephrogenic DI, hypernatremia P: Following BMP post correction of lithium levels Replacing free water losses with D5W, free water boluses  Bipolar Disorder  - patient denied suicidal intent.   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  Increase free water bolus, decrease D5W rate on 11/4 Replace electro lites as indicated  History bladder outlet obstruction  -s/p foley, patient of Dr. Tresa Moore, h/o of radiation tx for prostate Ca -spoke with urology, ok for nursing to change foley routine  P: Observe with Foley in place, may need urology input before we decide whether Foley can be discontinued during the hospitalization Follow-up with urology outpatient  Macrocytic Anemia  - LDH mildly elevated, no schistocytes, burr cells can be seen in renal disease  - ?ICU related BM suppression, sepsis associated - B12 normal, retics normal  P: Following CBC   Thrombocytopenia, improved some 11/4 Positive HIT Ab Possible HIT, SRA pending  P: Following CBC on argatroban Likely DC the argatroban if serotonin release assay returns negative  DM Hyperglycemia  P: Significant hyperglycemia, D5W infusion contributing, will try to decrease as able but need to keep up with free water losses Continue insulin drip for now and titrate down as able  Dusky left index and middle fingers, pulses intact Observing on argatroban Appreciate vascular surgery input  Disposition / Summary of Today's Plan  07/10/18       Diet: TF  Pain/Anxiety/Delirium protocol (if indicated): Yes  VAP protocol (if indicated): Yes  DVT prophylaxis: argatroban  GI prophylaxis: N/A. Hyperglycemia protocol: insulin gtt Mobility: Bedrest. Code Status: full. Family Communication: No family present 10/4  Labs (personally reviewed)  CBC: Recent Labs  Lab 07/04/18 0520 07/05/18 0544  07/07/18 0510 07/07/18 0628 07/08/18 0446 07/09/18 0340 07/10/18 0541  WBC 12.5*  18.0*   < > 13.5* 13.2* 13.0* 15.1* 15.1*  NEUTROABS 10.5* 14.8*  --   --   --   --   --   --   HGB 10.9* 10.6*   < > 9.6* 9.7* 8.8* 8.3* 8.9*  HCT 37.3* 37.4*   < > 35.3* 34.7* 32.6* 29.4* 31.6*  MCV 104.8* 107.5*   < > 108.0* 108.1* 107.9* 103.9* 104.6*  PLT 77* 69*   < > 49* 44* 53* 67* 89*   < > = values in this interval not displayed.    Basic Metabolic Panel: Recent Labs  Lab 07/05/18 0544  07/07/18 1618 07/08/18 0446 07/08/18 2359 07/09/18 0340 07/09/18 1609 07/10/18 0541  NA 152*   < > 154* 149* 140 141 143 145  146*  K 3.6   < > 3.1* 3.2* 2.8* 4.0 5.1 3.3*  3.4*  CL 125*   < > >130* 121* 108 112* 111 113*  112*  CO2 18*   < > 22 22 24 25 27 26  26   GLUCOSE 329*   < > 224* 176* 229* 147* 146* 196*  196*  BUN 41*   < > 34* 27* 25* 23* 22* 19  19  CREATININE 2.88*   < > 2.51* 1.83* 1.39* 1.35* 1.26* 1.24  1.22  CALCIUM 10.3   < > 9.9 9.3 9.1 9.4 9.4 9.5  9.5  MG 2.7*  --   --  2.4 2.3 2.5*  --  2.7*  PHOS 3.4  --  2.5 2.8  --  2.6 2.3* 2.4*  2.4*   < > = values in this interval not displayed.   GFR: Estimated Creatinine Clearance: 98.6 mL/min (by C-G formula based on SCr of 1.22 mg/dL). Recent Labs  Lab 07/07/18 0628 07/08/18 0446 07/09/18 0340 07/10/18 0541  WBC 13.2* 13.0* 15.1* 15.1*    Liver Function Tests: Recent Labs  Lab 07/04/18 0520 07/05/18 0544 07/07/18 1618 07/08/18 0446 07/09/18 0340 07/09/18 1609 07/10/18 0541  AST 22 21  --   --   --   --   --   ALT 24 22  --   --   --   --   --   ALKPHOS 48 45  --   --   --   --   --   BILITOT 0.6 0.5  --   --   --   --   --   PROT 6.2* 6.0*  --   --   --   --   --   ALBUMIN 2.7* 2.6* 2.3* 2.3* 2.3* 2.3* 2.3*   No results for input(s): LIPASE, AMYLASE in the last 168 hours. No results for input(s): AMMONIA in the last 168 hours.  ABG    Component Value Date/Time   PHART 7.442 07/10/2018 0345   PCO2ART 39.8 07/10/2018 0345   PO2ART 90.7 07/10/2018 0345   HCO3 26.7 07/10/2018 0345    TCO2 20 (L) 07/03/2018 2130   ACIDBASEDEF 10.4 (H) 07/06/2018 1000   O2SAT 97.8 07/10/2018 0345     Coagulation  Profile: Recent Labs  Lab 07/06/18 1156  INR 1.54    Cardiac Enzymes: No results for input(s): CKTOTAL, CKMB, CKMBINDEX, TROPONINI in the last 168 hours.    CBG: Recent Labs  Lab 07/10/18 0157 07/10/18 0258 07/10/18 0505 07/10/18 0602 07/10/18 0653  GLUCAP 173* 151* 157* 184* 226*    Independent CC time 40 minutes  Baltazar Apo, MD, PhD 07/10/2018, 9:09 AM [facility] [phone] or if no answer [phone]

## 2018-07-10 NOTE — Progress Notes (Signed)
ANTICOAGULATION CONSULT NOTE   Pharmacy Consult for argatroban Indication: cool hand, r/o HIT  Patient Measurements: Height: 6\' 2"  (188 cm) Weight: (!) 310 lb 6.5 oz (140.8 kg) IBW/kg (Calculated) : 82.2  Vital Signs: Temp: 99.9 F (37.7 C) (11/04 0800) Temp Source: Axillary (11/04 0800) BP: 132/75 (11/04 0700) Pulse Rate: 106 (11/04 0700)  Labs: Recent Labs    07/08/18 0446  07/09/18 0340 07/09/18 1609 07/10/18 0541  HGB 8.8*  --  8.3*  --  8.9*  HCT 32.6*  --  29.4*  --  31.6*  PLT 53*  --  67*  --  89*  APTT 58*  --  59*  --  50*  CREATININE 1.83*   < > 1.35* 1.26* 1.24  1.22   < > = values in this interval not displayed.   Assessment: 59 yoM initially on heparin SQ for VTE prophylaxis with platelet drop to 41. 4T score at least 5. HIT antibody positive, OD 2.7. SRA in process. Patient with dusky/cool hand per MD assessment but no intervention by vascular at this point. Pharmacy consulted to start IV argatroban for r/o clot  Currently on IV argatroban at 0.6 mcg/kg/min. H/H stable, Plt slowly improving. aPTT remains therapeutic this AM at 50. No bleeding noted.   Goal of Therapy:  aPTT 50-90 seconds Monitor platelets by anticoagulation protocol: Yes   Plan:  Continue argatroban at 0.6 mcg/kg/min  Daily aPTT/CBC F/u SRA result  Albertina Parr, PharmD., BCPS Clinical Pharmacist Clinical phone for 07/20/18 until 3:30pm: [phone] If after 3:30pm, please refer to [facility] for unit-specific pharmacist

## 2018-07-10 NOTE — Progress Notes (Signed)
[facility] Progress Note    Assessment/ Plan:   57yo male with PMH of bipolar disorder on lithium, T2DM, HTN, prostate cancer, OSA on CPAP presented with AMS and frequent falls due to acute on chronic lithium toxicity, now s/p HD x1 10/18 with resolution of lithium toxicity. Continued to have agitation, fever, started on IV abx with ID in consult and initiated CRRT 11/1 for continued metabolic abnormalities and possible uremia with improvement in metabolic abnormalities, however continued to have encephalopathy.  1. AKI due to acute on chronic lithium toxicity - CRRT (11/1-11/4) d/ced this am to determine if uremia etiology of encephalopathy. Cr 1.24, GFR >60 this am. Continues to make urine, UOP 1.3 ml/kg/hr with net -2L last 24 hours. Given good UOP and improvement in electrolytes, will monitor off of CRRT. 2. Hypernatremia - likely due to nephrogenic DI given chronic lithium use. No response to previous high dose desmopressin x2 and inability to initiate thiazides d/t AKI. Continues on D5W and insulin gtt with free water via OGT for DI. Na 145 this am s/p CRRT. If metabolic status and Cr remains stable off of CRRT, can consider initiating thiazides. 3. HTN/volume status - previously required pressors for lower BP. Currently normotensive without PRN doses of hydral. 4. Electrolytes - K 3.3 Na 145 Co2 26 Cl 113 Phos 2.4 Calcium 9.5. Repleting as necessary. 5. Hgb - remains stable at 8.9. Drop during admission likely due to initiation of CRRT and clotting x3 despite argatroban for presumed HIT. Plts 89. 6. AMS - persistently requiring sedation due to agitation. Continues to not have purposeful movement. 7. ID - per primary and ID. Previously on unasyn, fluconazole (d/c this am) for penile discharge and +cultures coag neg Staph (?contaminant) and penile cultures staph lugdunensis and E faecalis.  8. H/o BOO - urinary catheter placed, continues on flomax and proscar.   Subjective:    Patient remains sedated on vent without purposeful movements.   Objective:   BP 132/75   Pulse (!) 106   Temp 99.9 F (37.7 C) (Axillary)   Resp (!) 27   Ht _0  (1.88 m)   Wt (!) 140.8 kg   SpO2 99%   BMI 39.85 kg/m   Intake/Output Summary (Last 24 hours) at 07/10/2018 0900 Last data filed at 07/10/2018 0800 Gross per 24 hour  Intake 4451.86 ml  Output 6743 ml  Net -2291.14 ml   Weight change:   Physical Exam: Gen: sedated, on vent. CVS: RRR Resp: coarse breath sounds bilaterally, on vent Abd: soft, +BS Ext: persistent dusky L first fingers. 2+ pitting edema to all extremities up to elbow and knees respectively.  Imaging: Dg Chest Port 1 View  Result Date: 07/10/2018 CLINICAL DATA:  Endotracheal tube placement. EXAM: PORTABLE CHEST 1 VIEW COMPARISON:  07/07/2018 FINDINGS: Endotracheal tube has tip 6.3 cm above the carina. Nasogastric tube courses into the region of the stomach and off the inferior portion of the film as tip is not visualized. Left IJ central venous catheter is unchanged with tip obliquely oriented over the SVC. Right IJ central venous catheter has tip over the SVC. Lungs are adequately inflated with minimal linear density left base likely atelectasis. Mild prominence of the perihilar markings unchanged likely vascular congestion. No definite effusion. Cardiomediastinal silhouette and remainder of the exam is unchanged. IMPRESSION: Minimal linear atelectasis left base. Suggestion of mild stable vascular congestion. Tubes and lines as described. Electronically Signed   By: Marin Olp M.D.   On: 07/10/2018 07:47  Labs: BMET Recent Labs  Lab 07/04/18 0520 07/05/18 0544  07/07/18 0845 07/07/18 1618 07/08/18 0446 07/08/18 2359 07/09/18 0340 07/09/18 1609 07/10/18 0541  NA 148* 152*   < > 152* 154* 149* 140 141 143 145  146*  K 3.5 3.6   < > 3.4* 3.1* 3.2* 2.8* 4.0 5.1 3.3*  3.4*  CL 123* 125*   < > 130* >130* 121* 108 112* 111 113*  112*  CO2  19* 18*   < > 20* _0 GLUCOSE 392* 329*   < > 240* 224* 176* 229* 147* 146* 196*  196*  BUN 45* 41*   < > 33* 34* 27* 25* 23* 22* 19  19  CREATININE 3.22* 2.88*   < > 2.63* 2.51* 1.83* 1.39* 1.35* 1.26* 1.24  1.22  CALCIUM 10.0 10.3   < > 10.0 9.9 9.3 9.1 9.4 9.4 9.5  9.5  PHOS 4.1 3.4  --   --  2.5 2.8  --  2.6 2.3* 2.4*  2.4*   < > = values in this interval not displayed.   CBC Recent Labs  Lab 07/04/18 0520 07/05/18 0544  07/07/18 0628 07/08/18 0446 07/09/18 0340 07/10/18 0541  WBC 12.5* 18.0*   < > 13.2* 13.0* 15.1* 15.1*  NEUTROABS 10.5* 14.8*  --   --   --   --   --   HGB 10.9* 10.6*   < > 9.7* 8.8* 8.3* 8.9*  HCT 37.3* 37.4*   < > 34.7* 32.6* 29.4* 31.6*  MCV 104.8* 107.5*   < > 108.1* 107.9* 103.9* 104.6*  PLT 77* 69*   < > 44* 53* 67* 89*   < > = values in this interval not displayed.    Medications:    . aspirin  81 mg Per Tube Daily  . chlorhexidine gluconate (MEDLINE KIT)  15 mL Mouth Rinse BID  . citalopram  10 mg Per Tube Daily  . clonazepam  0.5 mg Per Tube BID  . feeding supplement (PRO-STAT SUGAR FREE 64)  60 mL Per Tube BID  . finasteride  5 mg Oral Daily  . folic acid  1 mg Intravenous Daily  . free water  400 mL Per Tube Q4H  . lamoTRIgine  200 mg Per Tube QHS  . mouth rinse  15 mL Mouth Rinse 10 times per day  . multivitamin  15 mL Per Tube Daily  . nystatin   Topical BID  . pantoprazole sodium  40 mg Per Tube Daily  . QUEtiapine  50 mg Per Tube BID  . tamsulosin  0.8 mg Oral Daily  . thiamine injection  100 mg Intravenous Q24H    Rory Percy, DO PGY2 07/10/2018, 9:00 AM

## 2018-07-10 NOTE — Progress Notes (Signed)
[facility] Progress Note Patient Name: Frank Cox DOB: [birth year] MRN: [id number]   Date of Service  07/10/2018  HPI/Events of Note  Patient on ventilator with settings 60%/PRVC 20/TV 650/P8 with RR = 31. CRRT stopped this AM and CXR this AM looked c/w mild pulmonary congestion. BP 111/62.  eICU Interventions  Will order: 1. Lasix 40 mg IV X 1 now.  2. Sedate as already orderd.     Intervention Category Major Interventions: Other:  Lysle Dingwall 07/10/2018, 9:13 PM

## 2018-07-11 ENCOUNTER — Inpatient Hospital Stay (HOSPITAL_COMMUNITY): Payer: 59

## 2018-07-11 DIAGNOSIS — J9602 Acute respiratory failure with hypercapnia: Secondary | ICD-10-CM

## 2018-07-11 LAB — GLUCOSE, CAPILLARY
GLUCOSE-CAPILLARY: 136 mg/dL — AB (ref 70–99)
GLUCOSE-CAPILLARY: 158 mg/dL — AB (ref 70–99)
GLUCOSE-CAPILLARY: 168 mg/dL — AB (ref 70–99)
GLUCOSE-CAPILLARY: 171 mg/dL — AB (ref 70–99)
GLUCOSE-CAPILLARY: 172 mg/dL — AB (ref 70–99)
GLUCOSE-CAPILLARY: 179 mg/dL — AB (ref 70–99)
GLUCOSE-CAPILLARY: 181 mg/dL — AB (ref 70–99)
GLUCOSE-CAPILLARY: 185 mg/dL — AB (ref 70–99)
Glucose-Capillary: 186 mg/dL — ABNORMAL HIGH (ref 70–99)
Glucose-Capillary: 156 mg/dL — ABNORMAL HIGH (ref 70–99)
Glucose-Capillary: 165 mg/dL — ABNORMAL HIGH (ref 70–99)
Glucose-Capillary: 150 mg/dL — ABNORMAL HIGH (ref 70–99)
Glucose-Capillary: 165 mg/dL — ABNORMAL HIGH (ref 70–99)
Glucose-Capillary: 163 mg/dL — ABNORMAL HIGH (ref 70–99)
Glucose-Capillary: 170 mg/dL — ABNORMAL HIGH (ref 70–99)
Glucose-Capillary: 163 mg/dL — ABNORMAL HIGH (ref 70–99)
Glucose-Capillary: 192 mg/dL — ABNORMAL HIGH (ref 70–99)
Glucose-Capillary: 181 mg/dL — ABNORMAL HIGH (ref 70–99)
Glucose-Capillary: 163 mg/dL — ABNORMAL HIGH (ref 70–99)
Glucose-Capillary: 166 mg/dL — ABNORMAL HIGH (ref 70–99)
Glucose-Capillary: 145 mg/dL — ABNORMAL HIGH (ref 70–99)
Glucose-Capillary: 145 mg/dL — ABNORMAL HIGH (ref 70–99)
Glucose-Capillary: 155 mg/dL — ABNORMAL HIGH (ref 70–99)
Glucose-Capillary: 176 mg/dL — ABNORMAL HIGH (ref 70–99)

## 2018-07-11 LAB — CBC
HCT: 32.3 % — ABNORMAL LOW (ref 39.0–52.0)
HEMOGLOBIN: 9.1 g/dL — AB (ref 13.0–17.0)
MCH: 31.1 pg (ref 26.0–34.0)
MCHC: 28.2 g/dL — ABNORMAL LOW (ref 30.0–36.0)
MCV: 110.2 fL — ABNORMAL HIGH (ref 80.0–100.0)
Platelets: 124 10*3/uL — ABNORMAL LOW (ref 150–400)
RBC: 2.93 MIL/uL — AB (ref 4.22–5.81)
RDW: 19.6 % — ABNORMAL HIGH (ref 11.5–15.5)
WBC: 15.7 10*3/uL — ABNORMAL HIGH (ref 4.0–10.5)
nRBC: 2.5 % — ABNORMAL HIGH (ref 0.0–0.2)

## 2018-07-11 LAB — RENAL FUNCTION PANEL
ALBUMIN: 2.1 g/dL — AB (ref 3.5–5.0)
ANION GAP: 9 (ref 5–15)
ANION GAP: 7 (ref 5–15)
Albumin: 2.3 g/dL — ABNORMAL LOW (ref 3.5–5.0)
BUN: 33 mg/dL — ABNORMAL HIGH (ref 6–20)
BUN: 39 mg/dL — ABNORMAL HIGH (ref 6–20)
CALCIUM: 10.4 mg/dL — AB (ref 8.9–10.3)
CALCIUM: 10.2 mg/dL (ref 8.9–10.3)
CHLORIDE: 116 mmol/L — AB (ref 98–111)
CO2: 24 mmol/L (ref 22–32)
CO2: 27 mmol/L (ref 22–32)
Chloride: 112 mmol/L — ABNORMAL HIGH (ref 98–111)
Creatinine, Ser: 1.79 mg/dL — ABNORMAL HIGH (ref 0.61–1.24)
Creatinine, Ser: 1.97 mg/dL — ABNORMAL HIGH (ref 0.61–1.24)
GFR calc Af Amer: 46 mL/min — ABNORMAL LOW (ref >60)
GFR calc non Af Amer: 40 mL/min — ABNORMAL LOW (ref >60)
GFR calc non Af Amer: 36 mL/min — ABNORMAL LOW (ref >60)
GFR, EST AFRICAN AMERICAN: 41 mL/min — AB (ref >60)
GLUCOSE: 150 mg/dL — AB (ref 70–99)
Glucose, Bld: 177 mg/dL — ABNORMAL HIGH (ref 70–99)
PHOSPHORUS: 4.6 mg/dL (ref 2.5–4.6)
Phosphorus: 4.1 mg/dL (ref 2.5–4.6)
Potassium: 3.3 mmol/L — ABNORMAL LOW (ref 3.5–5.1)
Potassium: 5.3 mmol/L — ABNORMAL HIGH (ref 3.5–5.1)
SODIUM: 149 mmol/L — AB (ref 135–145)
SODIUM: 146 mmol/L — AB (ref 135–145)

## 2018-07-11 LAB — POCT I-STAT 3, ART BLOOD GAS (G3+)
ACID-BASE DEFICIT: 1 mmol/L (ref 0.0–2.0)
Bicarbonate: 24.4 mmol/L (ref 20.0–28.0)
O2 Saturation: 93 %
PH ART: 7.378 (ref 7.350–7.450)
TCO2: 26 mmol/L (ref 22–32)
pCO2 arterial: 41.8 mmHg (ref 32.0–48.0)
pO2, Arterial: 71 mmHg — ABNORMAL LOW (ref 83.0–108.0)

## 2018-07-11 LAB — SEROTONIN RELEASE ASSAY (SRA)
SRA 100IU/mL UFH Ser-aCnc: 2 % (ref 0–20)
SRA, LOW DOSE HEPARIN: 101 % — AB (ref 0–20)

## 2018-07-11 LAB — MAGNESIUM: Magnesium: 3 mg/dL — ABNORMAL HIGH (ref 1.7–2.4)

## 2018-07-11 LAB — APTT
aPTT: 41 seconds — ABNORMAL HIGH (ref 24–36)
aPTT: 47 seconds — ABNORMAL HIGH (ref 24–36)
aPTT: 56 seconds — ABNORMAL HIGH (ref 24–36)
aPTT: 51 seconds — ABNORMAL HIGH (ref 24–36)

## 2018-07-11 MED ORDER — FOLIC ACID 1 MG PO TABS
1.0000 mg | ORAL_TABLET | Freq: Every day | ORAL | Status: DC
  Administered 2018-07-11 – 2018-07-17 (×7): 1 mg
  Filled 2018-07-11 (×8): qty 1

## 2018-07-11 MED ORDER — PRO-STAT SUGAR FREE PO LIQD
60.0000 mL | Freq: Four times a day (QID) | ORAL | Status: DC
  Administered 2018-07-11 – 2018-07-17 (×22): 60 mL
  Filled 2018-07-11 (×18): qty 60

## 2018-07-11 MED ORDER — POTASSIUM CHLORIDE 20 MEQ/15ML (10%) PO SOLN
40.0000 meq | Freq: Once | ORAL | Status: AC
  Administered 2018-07-11: 40 meq
  Filled 2018-07-11: qty 30

## 2018-07-11 MED ORDER — FUROSEMIDE 10 MG/ML IJ SOLN
40.0000 mg | Freq: Once | INTRAMUSCULAR | Status: AC
  Administered 2018-07-11: 40 mg via INTRAVENOUS
  Filled 2018-07-11: qty 4

## 2018-07-11 MED ORDER — VITAMIN B-1 100 MG PO TABS
100.0000 mg | ORAL_TABLET | Freq: Every day | ORAL | Status: DC
  Administered 2018-07-11 – 2018-07-17 (×7): 100 mg
  Filled 2018-07-11 (×7): qty 1

## 2018-07-11 MED ORDER — VITAL HIGH PROTEIN PO LIQD
1000.0000 mL | ORAL | Status: DC
  Administered 2018-07-11 – 2018-07-15 (×4): 1000 mL

## 2018-07-11 NOTE — Progress Notes (Addendum)
ANTICOAGULATION CONSULT NOTE   Pharmacy Consult for argatroban Indication: cool hand, r/o HIT  Patient Measurements: Height: 6\' 2"  (188 cm) Weight: (!) 300 lb 7.8 oz (136.3 kg) IBW/kg (Calculated) : 82.2  Vital Signs: Temp: 100.5 F (38.1 C) (11/05 1215) Temp Source: Axillary (11/05 1215) BP: 97/62 (11/05 1501) Pulse Rate: 93 (11/05 1501)  Labs: Recent Labs    07/09/18 0340 07/09/18 1609 07/10/18 0541 07/11/18 0218 07/11/18 1145 07/11/18 1522  HGB 8.3*  --  8.9* 9.1*  --   --   HCT 29.4*  --  31.6* 32.3*  --   --   PLT 67*  --  89* 124*  --   --   APTT 59*  --  50* 41* 47* 56*  CREATININE 1.35* 1.26* 1.24  1.22 1.79*  --   --    Assessment: 12 yoM initially on heparin SQ for VTE prophylaxis with platelet drop to 41. 4T score at least 5. HIT antibody positive, OD 2.7. SRA is positive. Patient with dusky/cool hand per MD assessment but no intervention by vascular at this point. Pharmacy consulted to start IV argatroban for r/o clot  Currently on IV argatroban at 0.86 mcg/kg/min. H/H stable, Plt slowly improving. APTT now up to 56. No bleeding noted.   Goal of Therapy:  aPTT 50-90 seconds Monitor platelets by anticoagulation protocol: Yes   Plan:  Continue argatroban at 0.67mcg/kg/min (4.19mL/hr) Check confirmatory aPTT in 2 hours  Daily aPTT/CBC  ADDENDUM:  Confirmatory aPTT is borderline therapeutic at 51. Continue current rate and monitor daily aPTT.  Elenor Quinones, PharmD, BCPS Clinical Pharmacist Phone number [phone] 07/11/2018 8:19 PM

## 2018-07-11 NOTE — Progress Notes (Addendum)
[facility] Progress Note    Assessment/ Plan:   57yo male with PMH of bipolar disorder on lithium, T2DM, HTN, prostate cancer, OSA on CPAP presented with AMS and frequent falls due to acute on chronic lithium toxicity, now s/p HD x1 10/18 with resolution of lithium toxicity. Continued to have agitation, fever, started on IV abx with ID in consult and initiated CRRT 11/1 for continued metabolic abnormalities and possible uremia with improvement in metabolic abnormalities, however continued to have encephalopathy.  1. AKI due to acute on chronic lithium toxicity - Lithium toxicity now resolved. S/p CRRT (11/1-11/4), Cr slightly increased this am 1.79, GFR 40. Continues to make urine with brisk UOP overnight, 2.3 ml/kg/hr with net -3L last 24 hours although did receive IV Lasix 56m x2 overnight for pulmonary congestion. Weight down 10lbs. Given good UOP and only slight worsening of electrolytes, can continue to monitor off CRRT. 2. Hypernatremia - likely due to nephrogenic DI given chronic lithium use. No response to previous high dose desmopressin x2 and previous inability to initiate thiazides d/t AKI. Continues on D5W 1098mhr and insulin gtt with free water via OGT for DI. Na 149 this am s/p CRRT and use of Lasix overnight. If metabolic status and Cr remains stable off of CRRT, can consider initiating thiazides. Will increase D5W rate and free water flushes. 3. HTN/volume status - previously required pressors for lower BP. Intermittently hypotensive overnight. Persistently with pitting edema to extremities. 4. Electrolytes - K 3.3 Na 149 Co2 24 Cl 116 Phos 4.1 Calcium 10.4. Repleting as necessary. 5. Hgb - remains stable at 9.1. Drop during admission likely due to initiation of CRRT and clotting x3 despite argatroban for presumed HIT. Plts improved this am 124. 6. AMS - persistently requiring sedation due to agitation. Continues to not have purposeful movement. 7. ID - per primary  and ID. No longer febrile. Previously on unasyn, fluconazole (d/c 11/4) for penile discharge and +cultures coag neg Staph (?contaminant) and penile cultures staph lugdunensis and E faecalis.  8. H/o BOO - urinary catheter placed, continues on flomax and proscar.   Subjective:   Sedated on vent.   Objective:   BP 119/68   Pulse (!) 107   Temp 99.9 F (37.7 C) (Axillary)   Resp (!) 27   Ht _0  (1.88 m)   Wt (!) 136.3 kg   SpO2 98%   BMI 38.58 kg/m   Intake/Output Summary (Last 24 hours) at 07/11/2018 0824 Last data filed at 07/11/2018 0800 Gross per 24 hour  Intake 4922.82 ml  Output 7225 ml  Net -2302.18 ml   Weight change: -4.5 kg  Physical Exam:  Gen: sedated, on vent. CVS: RRR Resp: slightly tachypneic, CTAB, on vent Abd: soft, +BS Ext: worsening dusky L first fingers. Persistent 2+ pitting edema to all extremities up to elbow and knees respectively.  Imaging: Dg Chest Port 1 View  Result Date: 07/11/2018 CLINICAL DATA:  Acute respiratory failure. EXAM: PORTABLE CHEST 1 VIEW COMPARISON:  Radiograph July 10, 2018. FINDINGS: Stable cardiomegaly. Endotracheal and nasogastric tubes are unchanged in position. Stable bilateral internal jugular catheters are noted. No pneumothorax is noted. Mild bilateral midlung subsegmental atelectasis is noted. Mild right basilar subsegmental atelectasis is noted. Bony thorax is unremarkable. IMPRESSION: Bilateral midlung subsegmental atelectasis. Mild right basilar subsegmental atelectasis. Stable support apparatus. Electronically Signed   By: JaMarijo ConceptionM.D.   On: 07/11/2018 07:54   Dg Chest Port 1 View  Result Date: 07/10/2018 CLINICAL DATA:  Endotracheal tube placement. EXAM: PORTABLE CHEST 1 VIEW COMPARISON:  07/07/2018 FINDINGS: Endotracheal tube has tip 6.3 cm above the carina. Nasogastric tube courses into the region of the stomach and off the inferior portion of the film as tip is not visualized. Left IJ central venous  catheter is unchanged with tip obliquely oriented over the SVC. Right IJ central venous catheter has tip over the SVC. Lungs are adequately inflated with minimal linear density left base likely atelectasis. Mild prominence of the perihilar markings unchanged likely vascular congestion. No definite effusion. Cardiomediastinal silhouette and remainder of the exam is unchanged. IMPRESSION: Minimal linear atelectasis left base. Suggestion of mild stable vascular congestion. Tubes and lines as described. Electronically Signed   By: Marin Olp M.D.   On: 07/10/2018 07:47    Labs: BMET Recent Labs  Lab 07/05/18 0544  07/07/18 1618 07/08/18 0446 07/08/18 2359 07/09/18 0340 07/09/18 1609 07/10/18 0541 07/11/18 0218  NA 152*   < > 154* 149* 140 141 143 145  146* 149*  K 3.6   < > 3.1* 3.2* 2.8* 4.0 5.1 3.3*  3.4* 3.3*  CL 125*   < > >130* 121* 108 112* 111 113*  112* 116*  CO2 18*   < > _0 GLUCOSE 329*   < > 224* 176* 229* 147* 146* 196*  196* 177*  BUN 41*   < > 34* 27* 25* 23* 22* 19  19 33*  CREATININE 2.88*   < > 2.51* 1.83* 1.39* 1.35* 1.26* 1.24  1.22 1.79*  CALCIUM 10.3   < > 9.9 9.3 9.1 9.4 9.4 9.5  9.5 10.4*  PHOS 3.4  --  2.5 2.8  --  2.6 2.3* 2.4*  2.4* 4.1   < > = values in this interval not displayed.   CBC Recent Labs  Lab 07/05/18 0544  07/08/18 0446 07/09/18 0340 07/10/18 0541 07/11/18 0218  WBC 18.0*   < > 13.0* 15.1* 15.1* 15.7*  NEUTROABS 14.8*  --   --   --   --   --   HGB 10.6*   < > 8.8* 8.3* 8.9* 9.1*  HCT 37.4*   < > 32.6* 29.4* 31.6* 32.3*  MCV 107.5*   < > 107.9* 103.9* 104.6* 110.2*  PLT 69*   < > 53* 67* 89* 124*   < > = values in this interval not displayed.    Medications:    . aspirin  81 mg Per Tube Daily  . chlorhexidine gluconate (MEDLINE KIT)  15 mL Mouth Rinse BID  . citalopram  10 mg Per Tube Daily  . clonazepam  0.5 mg Per Tube BID  . feeding supplement (PRO-STAT SUGAR FREE 64)  60 mL Per Tube BID  .  finasteride  5 mg Oral Daily  . folic acid  1 mg Intravenous Daily  . free water  400 mL Per Tube Q4H  . lamoTRIgine  200 mg Per Tube QHS  . mouth rinse  15 mL Mouth Rinse 10 times per day  . multivitamin  15 mL Per Tube Daily  . nystatin   Topical BID  . pantoprazole sodium  40 mg Per Tube Daily  . QUEtiapine  50 mg Per Tube BID  . tamsulosin  0.8 mg Oral Daily  . thiamine injection  100 mg Intravenous Q24H    Rory Percy, DO PGY2 07/11/2018, 8:24 AM

## 2018-07-11 NOTE — Progress Notes (Signed)
Nutrition Follow-up  DOCUMENTATION CODES:   Obesity unspecified  INTERVENTION:    Decrease Vital High Protein to 30 ml/hr (720 ml/day) via OG tube Increase Prostat to 60 ml Prostat QID  Provides: 1520 kcal, 183 grams protein, and 601 ml free water.  TF regimen and propofol at current rate providing 2182 total kcal/day  Total free water: 3001 ml   NUTRITION DIAGNOSIS:   Increased nutrient needs related to acute illness(sepsis) as evidenced by estimated needs. Ongoing.   GOAL:   Provide needs based on ASPEN/SCCM guidelines Met.   MONITOR:   TF tolerance, Skin  ASSESSMENT:   Pt with PMH of bipolar affective disorder, DM 2, HTN, Prostate ca, OSA on CPAP admitted 10/18 with AKI requiring urgent HD for acute on chronic lithium toxicity. Pt with increasing agitation tx to ICU and intubated 10/28.     Pt discussed during ICU rounds and with RN.  11/1-11/4 CRRT Per renal pt with poor prognosis  Patient is currently intubated on ventilator support Temp (24hrs), Avg:99.7 F (37.6 C), Min:98.6 F (37 C), Max:100.5 F (38.1 C)  Medications reviewed and include: folic acid, MVI, thiamine  Insulin drip  D5@ 150 ml/hr Propofol @ 25.1 ml/hr (30 mcg): 662 kcal per day Free water: 400 ml every 4 hours = 2400 ml  Labs reviewed: Na 149 (H), K+ 3.3 (L) CBG (last 3)  Recent Labs    07/11/18 1255 07/11/18 1413 07/11/18 1513  GLUCAP 181* 163* 166*    BP: 97/62 MAP: 64   UOP: 7375 ml x 24 hrs I/O: -11 L/day Weight is now lower than admission weight, pt with net negative, pt with +4 pitting edema   TF via OG tube:  Vital High Protein @ 55 ml/hr (1320 ml/day) via OG tube 60 ml Prostat BID Provides: 1720 kcal, 175 grams protein, and 1103 ml free water.   Diet Order:   Diet Order            Diet NPO time specified  Diet effective now              EDUCATION NEEDS:   No education needs have been identified at this time  Skin:  Skin Assessment: (MASD: buttocks and  groin)  Last BM:  175 ml x 24 hr  Height:   Ht Readings from Last 1 Encounters:  07/03/18 6' 2" (1.88 m)    Weight:   Wt Readings from Last 1 Encounters:  07/11/18 (!) 136.3 kg    Ideal Body Weight:  86.3 kg  BMI:  Body mass index is 38.58 kg/m.  Estimated Nutritional Needs:   Kcal:  1535-1954  Protein:  >/= 172 grams  Fluid:  > 2 L/day    RD, LDN, CNSC [phone] Pager [phone] After Hours Pager  

## 2018-07-11 NOTE — Progress Notes (Addendum)
ANTICOAGULATION CONSULT NOTE   Pharmacy Consult for argatroban Indication: cool hand, r/o HIT  Patient Measurements: Height: 6\' 2"  (188 cm) Weight: (!) 300 lb 7.8 oz (136.3 kg) IBW/kg (Calculated) : 82.2  Vital Signs: Temp: 99.9 F (37.7 C) (11/05 0400) Temp Source: Axillary (11/05 0400) BP: 115/69 (11/05 0600) Pulse Rate: 112 (11/05 0600)  Labs: Recent Labs    07/09/18 0340 07/09/18 1609 07/10/18 0541 07/11/18 0218  HGB 8.3*  --  8.9* 9.1*  HCT 29.4*  --  31.6* 32.3*  PLT 67*  --  89* 124*  APTT 59*  --  50* 41*  CREATININE 1.35* 1.26* 1.24  1.22 1.79*   Assessment: 81 yoM initially on heparin SQ for VTE prophylaxis with platelet drop to 41. 4T score at least 5. HIT antibody positive, OD 2.7. SRA in process. Patient with dusky/cool hand per MD assessment but no intervention by vascular at this point. Pharmacy consulted to start IV argatroban for r/o clot  Currently on IV argatroban at 0.6 mcg/kg/min. H/H stable, Plt slowly improving. APTT this AM is subtherapeutic at 41. No bleeding noted.   Goal of Therapy:  aPTT 50-90 seconds Monitor platelets by anticoagulation protocol: Yes   Plan:  Increase argatroban to 0.72 mcg/kg/min  Repeat aPTT in 2 hours  Daily aPTT/CBC F/u SRA result  Albertina Parr, PharmD., BCPS Clinical Pharmacist Clinical phone for 07/21/18 until 3:30pm: [phone] If after 3:30pm, please refer to [facility] for unit-specific pharmacist   Addendum: Repeat 2 hour aPTT remains subtherapeutic at 47s on 0.72 mcg/kg/min. No bleeding noted per RN. Will increase infusion rate by 20% to 0.86 mcg/kg/min and repeat 2 hr aPTT.   Albertina Parr, PharmD., BCPS Clinical Pharmacist

## 2018-07-11 NOTE — Progress Notes (Signed)
NAME:  Frank Cox, MRN:  [id number], DOB:  11-02-1960, LOS: 80 ADMISSION DATE:  06/08/2018, CONSULTATION DATE:  06/29/2018 REFERRING MD:  Stark Jock, ED [facility], CHIEF COMPLAINT:  Alerted mental status.  Brief History   57 year old man who requires urgent hemodialysis for acute on chronic lithium toxicity. Has been increasingly confused, experiencing multiple falls over the last week.  Li level 2.37 (upper limit 1.2) and nephrology wishes to dialyze the patient.   He was transferred out of ICU on 10/20.  On 10/24, he had agitation that did not respond to ativan or haldol; therefore, PCCM was called back for transfer to ICU.  Past Medical History  Bipolar affective disorder, T2DM on OHA, HTN, Prostate cancer, OSA on CPAP.  Significant Hospital Events   10/18 Admit with acute renal failure (cr 1.61) Foley catheter > 2 L immediately.  Underwent emergent dialysis for lithium toxicity 10/19 Lithium normalized.   10/20 Tx to SDU 10/24 Tx back to ICU due to agitation 10/26 PCCM called back for AMS, intermittent agitation 10/28 Intubated  07/11/2018 increase FiO2 needs treated with Lasix twice during the night  Consults: date of consult/date signed off & final recs:  Nephrology 10/18 - recommending HD Poison Control - recommending hydration, following serial Li levels. Psych 10/19 - do not restart lithium, needs psych consult once encephalopathy clears, recommending inpatient psych admission once medically cleared Urology 10/19 - input regarding possible obstructive uropathy, they recommend keeping foley in and follow up as outpatient  Procedures (surgical and bedside):  10/29 [facility]J CVC  11/1 [facility] HD Cath, started CRRT   Significant Diagnostic Tests:  CT head 10/15 > negative. Renal US 10/18 > diabetic glomerular nephropathy.  No obstruction.  Bilateral benign appearing renal cyts.  CT Head 10/29 >> Neg for acute process, enlarge orbital vein? CT Abd Pelvis 10/29 >> Negative for infectious source,  fluid in colon   EEG 10/29 >>> slowing, no seizure   MRI Brain 11/1 >>> unremarkable   Micro Data:  Urine 10/18 > negative Blood 10/20 > negative Blood 10/23 > negative  Urine 10/23 > negative Penile culture 10/22 >> staph lugdunensis (S-, entercoccus (vanco sens) 07/03/2018 blood culture>> MSSA questionable ? contaminant  UCx 10/28 >> NGTD  Blood 10/28 >> NGTD, X1 with Meth resistant staph prb contaminant   Trach Asp 10/28 >> NGTD    Antimicrobials:  Zosyn 10/20 > 10/21 Ceftriaxone 10/21 > 10/23 Unasyn 10/26 >>> off Oral Vanco 10/28, c diff neg, stopped   Cefepime 10/28 >> 10/31 Vanco 10/28 >> 10/31 Metronidazole 10/28, stopped Fluconazole 10/29 >> off  Subjective:  Currently on high FiO2 needs at 60% but down from 80% Pressors are off We will need a tracheostomy if he survives Questionable change in CODE STATUS if family's comes to bedside.  Objective   Blood pressure 119/68, pulse (!) 107, temperature 99.9 F (37.7 C), temperature source Axillary, resp. rate (!) 27, height 6\' 2"  (1.88 m), weight (!) 136.3 kg, SpO2 98 %.    Vent Mode: PRVC FiO2 (%):  [40 %-70 %] 70 % Set Rate:  [20 bmp] 20 bmp Vt Set:  [650 mL] 650 mL PEEP:  [8 cmH20] 8 cmH20 Plateau Pressure:  [17 cmH20-26 cmH20] 20 cmH20   Intake/Output Summary (Last 24 hours) at 07/11/2018 0818 Last data filed at 07/11/2018 0800 Gross per 24 hour  Intake 4922.82 ml  Output 7225 ml  Net -2302.18 ml   Filed Weights   07/04/18 0500 07/10/18 0500 07/11/18 0500  Weight: (!) 139.2 kg (!) 140.8 kg (!) 136.3 kg   Examination: General appearance: Appears much older than stated age appears ill HEENT: Endotracheal tube in place no JVD lymphadenopathy is appreciated Lung: Decreased breath sounds bilaterally CV: Sounds are regular Abdomen: Obese soft positive bowel sounds Extremities: Bilateral lower extremity venous stasis changes noted with edema, left hand second and third fingers with some hypoperfusion  change Skin: Cool, no rash Neuro: Does not respond to verbal or physical stimulus.  On high-dose sedation Assessment & Plan:   Septic Shock, unclear source, possible aspiration after being obtunded due to AMS, small right sided infiltrate, could represent atelectasis; prior cx with staph and enterococcus from penile discharge, one blood cx +methicillin resistant staph species(suspect contaminant)    No fever last 24 hours P: Currently off pressors Antibodies.  ID is signed off   Acute Hypoxemic and Hypercarbic Respiratory Failure, principally due to altered mental status and poor airway protection, also some component of interstitial infiltrates P: Refractory to multiple types of ventilation Hypoxia requiring high flow O2 If he survives he will need a tracheostomy We will repeat ABGs for completeness  Acute toxic, metabolic encephalopathy secondary to above  Agitated Delirium   - EEG with no seizure  -Metabolic disarray improved, lithium improved.  Suspect that delirium is now enlarged part due to his need for sedating medications P:  Continue Klonopin  continue Seroquel Continue propofol due to vent asynchrony     acute on Chronic Lithium Toxicity with Acute Toxic Encephalopathy  Suspected nephrogenic DI, hypernatremia P: Continue to follow electrolytes and replete as needed  Bipolar Disorder  - patient denied suicidal intent.   - S/p psych eval 10/19 P: Continue current treatment  Probable OSA / OHS P: Contributing factor to difficulty weaning  Acute on Chronic Renal Failure, Stage 2 CKD Nephrogenic DI from lithium Hypertnatremia Hyperchloremia Hypokalemia   P: CVVH on hold Following renal panel He is getting multiple doses of Lasix for his respiratory distress Currently off CRRT followed by renal   History bladder outlet obstruction  -s/p foley, patient of Dr. Tresa Moore, h/o of radiation tx for prostate Ca -spoke with urology, ok for nursing to change foley  routine  P: Continue Foley May need urology consult near future   Macrocytic Anemia  Recent Labs    07/10/18 0541 07/11/18 0218  HGB 8.9* 9.1*    Monitor CBC   P: Following CBC   Thrombocytopenia, improved some 11/4 Positive HIT Ab Possible HIT, SRA pending  P: Following CBC while anticoagulation Await serotonin levels  DM Hyperglycemia  CBG (last 3)  Recent Labs    07/11/18 0612 07/11/18 0656 07/11/18 0758  GLUCAP 170* 181* 179*    P: Continue to treat hyperglycemia D5W is contributing factor  Dusky left index and middle fingers, pulses intact Continue to monitor on anticoagulation with argatroban  Vascular surgery has signed off  Disposition / Summary of Today's Plan 07/11/18   Continues to have hypenatremia despite free water D5W.  Note he is had multiple doses of Lasix for respiratory distress. FiO2 is now up to 80% with a PEEP of 8 If continued aggressive care he will need tracheostomy near future     Diet: TF  Pain/Anxiety/Delirium protocol (if indicated): Yes  VAP protocol (if indicated): Yes  DVT prophylaxis: argatroban  GI prophylaxis: N/A. Hyperglycemia protocol: insulin gtt Mobility: Bedrest. Code Status: full. Family Communication: 06/10/2018 no family at bedside.  Labs (personally reviewed)  CBC: Recent Labs  Lab 07/05/18  [id number]  07/07/18 6378 07/08/18 0446 07/09/18 0340 07/10/18 0541 07/11/18 0218  WBC 18.0*   < > 13.2* 13.0* 15.1* 15.1* 15.7*  NEUTROABS 14.8*  --   --   --   --   --   --   HGB 10.6*   < > 9.7* 8.8* 8.3* 8.9* 9.1*  HCT 37.4*   < > 34.7* 32.6* 29.4* 31.6* 32.3*  MCV 107.5*   < > 108.1* 107.9* 103.9* 104.6* 110.2*  PLT 69*   < > 44* 53* 67* 89* 124*   < > = values in this interval not displayed.    Basic Metabolic Panel: Recent Labs  Lab 07/08/18 0446 07/08/18 2359 07/09/18 0340 07/09/18 1609 07/10/18 0541 07/11/18 0218  NA 149* 140 141 143 145  146* 149*  K 3.2* 2.8* 4.0 5.1 3.3*  3.4* 3.3*  CL  121* 108 112* 111 113*  112* 116*  CO2 22 24 25 27 26  26 24   GLUCOSE 176* 229* 147* 146* 196*  196* 177*  BUN 27* 25* 23* 22* 19  19 33*  CREATININE 1.83* 1.39* 1.35* 1.26* 1.24  1.22 1.79*  CALCIUM 9.3 9.1 9.4 9.4 9.5  9.5 10.4*  MG 2.4 2.3 2.5*  --  2.7* 3.0*  PHOS 2.8  --  2.6 2.3* 2.4*  2.4* 4.1   GFR: Estimated Creatinine Clearance: 66 mL/min (A) (by C-G formula based on SCr of 1.79 mg/dL (H)). Recent Labs  Lab 07/08/18 0446 07/09/18 0340 07/10/18 0541 07/11/18 0218  WBC 13.0* 15.1* 15.1* 15.7*    Liver Function Tests: Recent Labs  Lab 07/05/18 0544  07/08/18 0446 07/09/18 0340 07/09/18 1609 07/10/18 0541 07/11/18 0218  AST 21  --   --   --   --   --   --   ALT 22  --   --   --   --   --   --   ALKPHOS 45  --   --   --   --   --   --   BILITOT 0.5  --   --   --   --   --   --   PROT 6.0*  --   --   --   --   --   --   ALBUMIN 2.6*   < > 2.3* 2.3* 2.3* 2.3* 2.3*   < > = values in this interval not displayed.   No results for input(s): LIPASE, AMYLASE in the last 168 hours. No results for input(s): AMMONIA in the last 168 hours.  ABG    Component Value Date/Time   PHART 7.386 07/10/2018 1342   PCO2ART 42.5 07/10/2018 1342   PO2ART 72.0 (L) 07/10/2018 1342   HCO3 25.5 07/10/2018 1342   TCO2 27 07/10/2018 1342   ACIDBASEDEF 10.4 (H) 07/06/2018 1000   O2SAT 94.0 07/10/2018 1342     Coagulation Profile: Recent Labs  Lab 07/06/18 1156  INR 1.54    Cardiac Enzymes: No results for input(s): CKTOTAL, CKMB, CKMBINDEX, TROPONINI in the last 168 hours.    CBG: Recent Labs  Lab 07/11/18 0404 07/11/18 0511 07/11/18 0612 07/11/18 0656 07/11/18 0758  GLUCAP 165* 163* 170* 181* 179*    App cct 45 min  Richardson Landry Hazeline Charnley ACNP Maryanna Shape PCCM Pager [phone] till 1 pm If no answer page [phone] 07/11/2018, 8:18 AM

## 2018-07-11 NOTE — Progress Notes (Signed)
[facility] Progress Note Patient Name: Frank Cox DOB: August 11, 1961 MRN: [id number]   Date of Service  07/11/2018  HPI/Events of Note  K+ = 3.3 and Creatinine = 1.79.  eICU Interventions  Will replace K+.     Intervention Category Major Interventions: Electrolyte abnormality - evaluation and management  Sommer,Steven Eugene 07/11/2018, 6:37 AM

## 2018-07-11 NOTE — Progress Notes (Signed)
eLink Physician-Brief Progress Note Patient Name: Frank Cox DOB: [birth year] MRN: [id number]   Date of Service  07/11/2018  HPI/Events of Note  Patient having labored respirations when sedation get light. CXR yesterday c/w pulmonary congestion.  eICU Interventions  Will order: 1. Lasix 40 mg IV X 1 now.      Intervention Category Major Interventions: Other:  Sommer,Steven Cornelia Copa 07/11/2018, 4:02 AM

## 2018-07-12 ENCOUNTER — Inpatient Hospital Stay (HOSPITAL_COMMUNITY): Payer: 59

## 2018-07-12 ENCOUNTER — Other Ambulatory Visit: Payer: Self-pay | Admitting: Nephrology

## 2018-07-12 DIAGNOSIS — E119 Type 2 diabetes mellitus without complications: Secondary | ICD-10-CM

## 2018-07-12 DIAGNOSIS — E782 Mixed hyperlipidemia: Secondary | ICD-10-CM

## 2018-07-12 LAB — CBC
HEMATOCRIT: 28.7 % — AB (ref 39.0–52.0)
HEMOGLOBIN: 8 g/dL — AB (ref 13.0–17.0)
MCH: 30.1 pg (ref 26.0–34.0)
MCHC: 27.9 g/dL — ABNORMAL LOW (ref 30.0–36.0)
MCV: 107.9 fL — AB (ref 80.0–100.0)
Platelets: 122 10*3/uL — ABNORMAL LOW (ref 150–400)
RBC: 2.66 MIL/uL — ABNORMAL LOW (ref 4.22–5.81)
RDW: 15.6 % — ABNORMAL HIGH (ref 11.5–15.5)
WBC: 12.4 10*3/uL — ABNORMAL HIGH (ref 4.0–10.5)
nRBC: 1.2 % — ABNORMAL HIGH (ref 0.0–0.2)

## 2018-07-12 LAB — GLUCOSE, CAPILLARY
GLUCOSE-CAPILLARY: 143 mg/dL — AB (ref 70–99)
GLUCOSE-CAPILLARY: 129 mg/dL — AB (ref 70–99)
GLUCOSE-CAPILLARY: 127 mg/dL — AB (ref 70–99)
GLUCOSE-CAPILLARY: 150 mg/dL — AB (ref 70–99)
GLUCOSE-CAPILLARY: 181 mg/dL — AB (ref 70–99)
GLUCOSE-CAPILLARY: 170 mg/dL — AB (ref 70–99)
GLUCOSE-CAPILLARY: 181 mg/dL — AB (ref 70–99)
GLUCOSE-CAPILLARY: 161 mg/dL — AB (ref 70–99)
GLUCOSE-CAPILLARY: 185 mg/dL — AB (ref 70–99)
GLUCOSE-CAPILLARY: 149 mg/dL — AB (ref 70–99)
GLUCOSE-CAPILLARY: 157 mg/dL — AB (ref 70–99)
GLUCOSE-CAPILLARY: 168 mg/dL — AB (ref 70–99)
GLUCOSE-CAPILLARY: 181 mg/dL — AB (ref 70–99)
Glucose-Capillary: 132 mg/dL — ABNORMAL HIGH (ref 70–99)
Glucose-Capillary: 144 mg/dL — ABNORMAL HIGH (ref 70–99)
Glucose-Capillary: 145 mg/dL — ABNORMAL HIGH (ref 70–99)
Glucose-Capillary: 148 mg/dL — ABNORMAL HIGH (ref 70–99)
Glucose-Capillary: 155 mg/dL — ABNORMAL HIGH (ref 70–99)
Glucose-Capillary: 156 mg/dL — ABNORMAL HIGH (ref 70–99)
Glucose-Capillary: 177 mg/dL — ABNORMAL HIGH (ref 70–99)
Glucose-Capillary: 193 mg/dL — ABNORMAL HIGH (ref 70–99)
Glucose-Capillary: 201 mg/dL — ABNORMAL HIGH (ref 70–99)

## 2018-07-12 LAB — RENAL FUNCTION PANEL
ANION GAP: 7 (ref 5–15)
Albumin: 2.1 g/dL — ABNORMAL LOW (ref 3.5–5.0)
BUN: 46 mg/dL — ABNORMAL HIGH (ref 6–20)
CALCIUM: 10.4 mg/dL — AB (ref 8.9–10.3)
CO2: 26 mmol/L (ref 22–32)
Chloride: 113 mmol/L — ABNORMAL HIGH (ref 98–111)
Creatinine, Ser: 1.86 mg/dL — ABNORMAL HIGH (ref 0.61–1.24)
GFR calc non Af Amer: 38 mL/min — ABNORMAL LOW (ref >60)
GFR, EST AFRICAN AMERICAN: 44 mL/min — AB (ref >60)
GLUCOSE: 191 mg/dL — AB (ref 70–99)
POTASSIUM: 3 mmol/L — AB (ref 3.5–5.1)
Phosphorus: 5.1 mg/dL — ABNORMAL HIGH (ref 2.5–4.6)
SODIUM: 146 mmol/L — AB (ref 135–145)

## 2018-07-12 LAB — PHOSPHORUS: PHOSPHORUS: 5 mg/dL — AB (ref 2.5–4.6)

## 2018-07-12 LAB — TRIGLYCERIDES: Triglycerides: 280 mg/dL — ABNORMAL HIGH (ref <150)

## 2018-07-12 LAB — MAGNESIUM: Magnesium: 2.7 mg/dL — ABNORMAL HIGH (ref 1.7–2.4)

## 2018-07-12 LAB — APTT: APTT: 60 s — AB (ref 24–36)

## 2018-07-12 MED ORDER — POTASSIUM CHLORIDE 20 MEQ/15ML (10%) PO SOLN
40.0000 meq | Freq: Once | ORAL | Status: AC
  Administered 2018-07-12: 40 meq
  Filled 2018-07-12: qty 30

## 2018-07-12 MED ORDER — METHADONE HCL 10 MG PO TABS
10.0000 mg | ORAL_TABLET | Freq: Two times a day (BID) | ORAL | Status: DC
  Administered 2018-07-12 – 2018-07-17 (×11): 10 mg via ORAL
  Filled 2018-07-12 (×11): qty 1

## 2018-07-12 MED ORDER — QUETIAPINE FUMARATE 100 MG PO TABS
100.0000 mg | ORAL_TABLET | Freq: Two times a day (BID) | ORAL | Status: DC
  Administered 2018-07-12 – 2018-07-17 (×11): 100 mg
  Filled 2018-07-12 (×11): qty 1

## 2018-07-12 MED ORDER — CLONAZEPAM 0.5 MG PO TBDP
1.0000 mg | ORAL_TABLET | Freq: Two times a day (BID) | ORAL | Status: DC
  Administered 2018-07-12 – 2018-07-17 (×11): 1 mg
  Filled 2018-07-12 (×11): qty 2

## 2018-07-12 MED ORDER — CITALOPRAM HYDROBROMIDE 10 MG PO TABS
40.0000 mg | ORAL_TABLET | Freq: Every day | ORAL | Status: DC
  Administered 2018-07-13 – 2018-07-17 (×5): 40 mg
  Filled 2018-07-12 (×5): qty 4

## 2018-07-12 MED ORDER — POTASSIUM CHLORIDE 20 MEQ/15ML (10%) PO SOLN
40.0000 meq | Freq: Once | ORAL | Status: AC
  Administered 2018-07-12: 40 meq via ORAL
  Filled 2018-07-12: qty 30

## 2018-07-12 MED ORDER — AMILORIDE HCL 5 MG PO TABS
5.0000 mg | ORAL_TABLET | Freq: Every day | ORAL | Status: DC
  Administered 2018-07-13: 5 mg via ORAL
  Filled 2018-07-12 (×2): qty 1

## 2018-07-12 NOTE — Progress Notes (Signed)
NAME:  Frank Cox, MRN:  [id number], DOB:  1961/02/17, LOS: 34 ADMISSION DATE:  06/22/2018, CONSULTATION DATE:  06/11/2018 REFERRING MD:  Stark Jock, ED [facility], CHIEF COMPLAINT:  Alerted mental status.  Brief History   57 year old man who requires urgent hemodialysis for acute on chronic lithium toxicity. Has been increasingly confused, experiencing multiple falls over the last week.  Li level 2.37 (upper limit 1.2) and nephrology wishes to dialyze the patient.   He was transferred out of ICU on 10/20.  On 10/24, he had agitation that did not respond to ativan or haldol; therefore, PCCM was called back for transfer to ICU.  Past Medical History  Bipolar affective disorder, T2DM on OHA, HTN, Prostate cancer, OSA on CPAP.  Significant Hospital Events   10/18 Admit with acute renal failure (cr 1.61) Foley catheter > 2 L immediately.  Underwent emergent dialysis for lithium toxicity 10/19 Lithium normalized.   10/20 Tx to SDU 10/24 Tx back to ICU due to agitation 10/26 PCCM called back for AMS, intermittent agitation 10/28 Intubated  11/4 CVVHD held 07/11/2018 increase FiO2 needs treated with Lasix twice during the night  Consults: date of consult/date signed off & final recs:  Nephrology 10/18 - recommending HD Poison Control - recommending hydration, following serial Li levels. Psych 10/19 - do not restart lithium, needs psych consult once encephalopathy clears, recommending inpatient psych admission once medically cleared Urology 10/19 - input regarding possible obstructive uropathy, they recommend keeping foley in and follow up as outpatient 10/29 ID consult for fever 10/31 vascular surgery consult> R hand dusky with HIT  Procedures (surgical and bedside):  10/29 [facility]J CVC  11/1 [facility] HD Cath, started CRRT   Significant Diagnostic Tests:  CT head 10/15 > negative. Renal US 10/18 > diabetic glomerular nephropathy.  No obstruction.  Bilateral benign appearing renal cyts.  CT Head 10/29 >>  Neg for acute process, enlarge orbital vein? CT Abd Pelvis 10/29 >> Negative for infectious source, fluid in colon   EEG 10/29 >>> slowing, no seizure   MRI Brain 11/1 >>> unremarkable   Micro Data:  Urine 10/18 > negative Blood 10/20 > negative Blood 10/23 > negative  Urine 10/23 > negative Penile culture 10/22 >> staph lugdunensis (S-, entercoccus (vanco sens) UCx 10/28 >> NGTD  Blood 10/28 >> Coag neg staph 1/4   Trach Asp 10/28 >> NGTD    Antimicrobials:  Zosyn 10/20 > 10/21 Ceftriaxone 10/21 > 10/23 Unasyn 10/26 >>> off Oral Vanco 10/28, c diff neg, stopped   Cefepime 10/28 >> 10/31 Vanco 10/28 >> 10/31 Metronidazole 10/28, stopped Fluconazole 10/29 >> off  Subjective:  Currently on high FiO2 needs at 60% but down from 80% Pressors are off We will need a tracheostomy if he survives Questionable change in CODE STATUS if family's comes to bedside.  Objective   Blood pressure 108/61, pulse 63, temperature 98.7 F (37.1 C), temperature source Oral, resp. rate (!) 24, height 6\' 2"  (1.88 m), weight (!) 137.6 kg, SpO2 93 %.    Vent Mode: PSV;CPAP FiO2 (%):  [40 %] 40 % Set Rate:  [16 bmp] 16 bmp PEEP:  [8 cmH20] 8 cmH20 Pressure Support:  [12 cmH20] 12 cmH20 Plateau Pressure:  [22 cmH20-23 cmH20] 22 cmH20   Intake/Output Summary (Last 24 hours) at 07/12/2018 0809 Last data filed at 07/12/2018 0700 Gross per 24 hour  Intake 6512.06 ml  Output 4250 ml  Net 2262.06 ml   Filed Weights   07/10/18 0500 07/11/18 0500  07/12/18 0500  Weight: (!) 140.8 kg (!) 136.3 kg (!) 137.6 kg   Examination: General: Morbidly obese male heavily sedated on ventilator he is either sedated or agitated HEENT: Endotracheal tube connected to ventilator, orogastric tube with tube feeds Neuro: When not sedated becomes tachypneic and reaches for tube CV: Heart sounds are regular regular rate and rhythm PULM: even/non-labored, lungs bilaterally rhonchi HB:ZJIR, non-tender, bsx4 active    Extremities: Left hand with thumb and first finger ischemic changes bilateral lower extremity vascular static changes Skin: Multiple areas of ecchymosis  Assessment & Plan:   Septic Shock, unclear source, possible aspiration after being obtunded due to AMS, small right sided infiltrate, could represent atelectasis; prior cx with staph and enterococcus from penile discharge, one blood cx +methicillin resistant staph species(suspect contaminant)    No fever last 24 hours P: Off pressors Off antibiotics   Acute Hypoxemic and Hypercarbic Respiratory Failure, principally due to altered mental status and poor airway protection, also some component of interstitial infiltrates P: Currently being adequately ventilated on 40% FiO2 Agitation is a contributing factor to the fact he can wean He needs a tracheostomy in the near future  Acute toxic, metabolic encephalopathy secondary to above  Agitated Delirium   - EEG with no seizure  -Metabolic disarray improved, lithium improved.  Suspect that delirium is now enlarged part due to his need for sedating medications P:  Increased Klonopin  Increase Seroquel Continue propofol due to vent asynchrony and hopefully we can achieve increased sedation via oral agents.     acute on Chronic Lithium Toxicity with Acute Toxic Encephalopathy  Suspected nephrogenic DI, hypernatremia P: Follow electrolytes and treat as needed  Bipolar Disorder  - patient denied suicidal intent.   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 Tresa Moore, h/o of  radiation tx for prostate Ca -spoke with urology, ok for nursing to change foley routine  P: Foley in place May need urology consult in future   Macrocytic Anemia  Recent Labs    07/11/18 0218 07/12/18 0440  HGB 9.1* 8.0*    Monitor CBC   P: Following CBC   Thrombocytopenia, platelets are 122 on 07/12/2018 Positive HIT Ab Probable HIT, SRA 101 P: Following CBC while anticoagulation with argatroban  Note serotonin levels  DM Hyperglycemia  CBG (last 3)  Recent Labs    07/12/18 0523 07/12/18 0615 07/12/18 0705  GLUCAP 181* 170* 181*    P: Continue insulin drip Once hyper natremia is improved attempt to get off D5W  Dusky left index and middle fingers, pulses intact Continue anticoagulation with argatroban  Vascular surgery has signed off  Disposition / Summary of Today's Plan 07/12/18   Hypernatremia is improved with current treatments.  Therefore continue D5W and free water Failed weaning due to tachypnea and attempting to pull out endotracheal tube he is either sedated or agitated Potassium is been repleted Continue to treat glucose that is being exacerbated by D5W     Diet: TF  Pain/Anxiety/Delirium protocol (if indicated): Yes  VAP protocol (if indicated): Yes  DVT prophylaxis: argatroban  GI prophylaxis: N/A. Hyperglycemia protocol: insulin gtt Mobility: Bedrest. Code  Status: full. Family Communication: 07/12/2018 no family at bedside.  Labs (personally reviewed)  CBC: Recent Labs  Lab 07/08/18 0446 07/09/18 0340 07/10/18 0541 07/11/18 0218 07/12/18 0440  WBC 13.0* 15.1* 15.1* 15.7* 12.4*  HGB 8.8* 8.3* 8.9* 9.1* 8.0*  HCT 32.6* 29.4* 31.6* 32.3* 28.7*  MCV 107.9* 103.9* 104.6* 110.2* 107.9*  PLT 53* 67* 89* 124* 122*    Basic Metabolic Panel: Recent Labs  Lab 07/08/18 2359 07/09/18 0340 07/09/18 1609 07/10/18 0541 07/11/18 0218 07/11/18 1630 07/12/18 0440  NA 140 141 143 145  146* 149* 146* 146*  K 2.8* 4.0 5.1 3.3*  3.4*  3.3* 5.3* 3.0*  CL 108 112* 111 113*  112* 116* 112* 113*  CO2 24 25 27 26  26 24 27 26   GLUCOSE 229* 147* 146* 196*  196* 177* 150* 191*  BUN 25* 23* 22* 19  19 33* 39* 46*  CREATININE 1.39* 1.35* 1.26* 1.24  1.22 1.79* 1.97* 1.86*  CALCIUM 9.1 9.4 9.4 9.5  9.5 10.4* 10.2 10.4*  MG 2.3 2.5*  --  2.7* 3.0*  --  2.7*  PHOS  --  2.6 2.3* 2.4*  2.4* 4.1 4.6 5.1*  5.0*   GFR: Estimated Creatinine Clearance: 63.9 mL/min (A) (by C-G formula based on SCr of 1.86 mg/dL (H)). Recent Labs  Lab 07/09/18 0340 07/10/18 0541 07/11/18 0218 07/12/18 0440  WBC 15.1* 15.1* 15.7* 12.4*    Liver Function Tests: Recent Labs  Lab 07/09/18 1609 07/10/18 0541 07/11/18 0218 07/11/18 1630 07/12/18 0440  ALBUMIN 2.3* 2.3* 2.3* 2.1* 2.1*   No results for input(s): LIPASE, AMYLASE in the last 168 hours. No results for input(s): AMMONIA in the last 168 hours.  ABG    Component Value Date/Time   PHART 7.378 07/11/2018 0954   PCO2ART 41.8 07/11/2018 0954   PO2ART 71.0 (L) 07/11/2018 0954   HCO3 24.4 07/11/2018 0954   TCO2 26 07/11/2018 0954   ACIDBASEDEF 1.0 07/11/2018 0954   O2SAT 93.0 07/11/2018 0954     Coagulation Profile: Recent Labs  Lab 07/06/18 1156  INR 1.54    Cardiac Enzymes: No results for input(s): CKTOTAL, CKMB, CKMBINDEX, TROPONINI in the last 168 hours.    CBG: Recent Labs  Lab 07/12/18 0312 07/12/18 0419 07/12/18 0523 07/12/18 0615 07/12/18 0705  GLUCAP 127* 150* 181* 170* 181*    App cct 45 min  Richardson Landry Minor ACNP Maryanna Shape PCCM Pager [phone] till 1 pm If no answer page [phone] 07/12/2018, 8:09 AM   Attending:    Subjective: Remains severely agitated and unable to be re-directed unless heavily sedated Low grade temp  Objective: Vitals:   07/12/18 0700 07/12/18 0742 07/12/18 0800 07/12/18 0900  BP: (!) 97/51 108/61 128/63 (!) 128/56  Pulse: 65 63 78 76  Resp: (!) 21 (!) 24 (!) 23 (!) 26  Temp:   98.6 F (37 C)   TempSrc:    Axillary   SpO2: 97% 93%  97%  Weight:      Height:       Vent Mode: PSV;CPAP FiO2 (%):  [40 %] 40 % Set Rate:  [16 bmp] 16 bmp PEEP:  [8 cmH20] 8 cmH20 Pressure Support:  [12 cmH20] 12 cmH20 Plateau Pressure:  [22 cmH20-23 cmH20] 22 cmH20  Intake/Output Summary (Last 24 hours) at 07/12/2018 1059 Last data filed at 07/12/2018 1015 Gross per 24 hour  Intake 6512.06 ml  Output 5525 ml  Net 987.06 ml    General:  In bed on  vent HENT: NCAT ETT in place PULM: CTA B, vent supported breathing CV: RRR, no mgr GI: BS+, soft, nontender MSK: normal bulk and tone Neuro: sedated on vent     CBC    Component Value Date/Time   WBC 12.4 (H) 07/12/2018 0440   RBC 2.66 (L) 07/12/2018 0440   HGB 8.0 (L) 07/12/2018 0440   HCT 28.7 (L) 07/12/2018 0440   PLT 122 (L) 07/12/2018 0440   MCV 107.9 (H) 07/12/2018 0440   MCH 30.1 07/12/2018 0440   MCHC 27.9 (L) 07/12/2018 0440   RDW 15.6 (H) 07/12/2018 0440   LYMPHSABS 1.8 07/05/2018 0544   MONOABS 1.3 (H) 07/05/2018 0544   EOSABS 0.0 07/05/2018 0544   BASOSABS 0.0 07/05/2018 0544    BMET    Component Value Date/Time   NA 146 (H) 07/12/2018 0440   K 3.0 (L) 07/12/2018 0440   CL 113 (H) 07/12/2018 0440   CO2 26 07/12/2018 0440   GLUCOSE 191 (H) 07/12/2018 0440   BUN 46 (H) 07/12/2018 0440   CREATININE 1.86 (H) 07/12/2018 0440   CALCIUM 10.4 (H) 07/12/2018 0440   GFRNONAA 38 (L) 07/12/2018 0440   GFRAA 44 (L) 07/12/2018 0440    CXR images personally reviewed, bibasilar airspace disease ETT in place  Impression/Plan: Nephrogenic DI: free water/D5/insulin per renal AKI: monitor UOP, BMET, HD per renal (currently on hold) Fever: resolved-ish; cultures without clear sign of infection, monitor Acute respiratory failure with hypoxemia: not able to pass SBT due to severe agitation, likely needs trach Acute agitation/encephalopathy: clonazepam, seroquel, increase celexa to home dose, continue propofol per PAD protocol, add methadone,  continue lamictal, monitor QTc closely Nicoletta Dress tox: hold lithim HIT: continue argatroban  My cc time 40 minutes  Roselie Awkward, MD [place] [facility] Pager: [phone] Cell: [phone] After 3pm or if no response, call [phone]

## 2018-07-12 NOTE — Progress Notes (Signed)
RT note: patient noted to have increased minute ventilation, increased respiratory rate, and starting to decrease in sats.  Placed patient back on full support ventilation.  RN aware.  Will continue to monitor.

## 2018-07-12 NOTE — Progress Notes (Signed)
[facility] Progress Note    Assessment/ Plan:   57yo male with PMH of bipolar disorder on lithium, T2DM, HTN, prostate cancer, OSA on CPAP presented with AMS and frequent falls due to acute on chronic lithium toxicity, now s/p HD x1 10/18 with resolution of lithium toxicity. Continued to have agitation, fever, started on IV abx with ID in consult and initiated CRRT 11/1-11/4 for continued metabolic abnormalities and possible uremia with improvement in metabolic abnormalities, however continued to have encephalopathy.  1. AKI due to acute on chronic lithium toxicity - Lithium toxicity now resolved. S/p CRRT (11/1-11/4), Cr slightly improved from yesterday pm however overall increased, 1.86 this am, GFR 38. UOP 1.3 ml/kg/hr with net +2L last 24 hours with increase in IVF rate for hypernatremia. Weight up 3lbs. Given stable electrolytes and only slight worsening of Cr, can continue to monitor off CRRT but will continue to maintain access. 2. Hypernatremia - likely due to nephrogenic DI given chronic lithium use. Minimal response to previous high dose desmopressin x2, indicating partial DI with previous inability to initiate thiazides d/t AKI. D5W rate increased yesterday to 195m/hr with continuation of free water flushes and insulin gtt, improvement noted to Na 146 and matching of UOP. Can consider initiating thiazide or amiloride given only slight worsening of Cr, will continue to monitor. 3. HTN/volume status - continues to have lower BP, previously required pressors. Persistently with pitting edema to extremities. 4. Electrolytes - K 3.0 Na 146 Co2 26 Cl 113 Phos 5.1 Calcium 10.4. Repleting as necessary. 5. Macrocytic Anemia - Hb 8.0, macrocytosis developed during admission, unclear etiology. Drop in Hb overnight could be dilutional given increase in IVF. Drop during admission likely due to initiation of CRRT and clotting x3 despite argatroban for presumed HIT. Plts stable 122. 6. AMS  - persistently requiring sedation due to agitation. Continues to not have purposeful movement. 7. ID - per primary and ID. No longer febrile. Previously on unasyn, fluconazole (d/c 11/4) for penile discharge and +cultures coag neg Staph (?contaminant) and penile cultures staph lugdunensis and E faecalis.  8. H/o BOO - urinary catheter placed, continues on flomax and proscar.   Subjective:   Sedated on vent, snoring.   Objective:   BP (!) 106/54   Pulse 65   Temp 98.7 F (37.1 C) (Oral)   Resp 19   Ht 6' 2"  (1.88 m)   Wt (!) 137.6 kg   SpO2 96%   BMI 38.95 kg/m   Intake/Output Summary (Last 24 hours) at 07/12/2018 0701 Last data filed at 07/12/2018 0630 Gross per 24 hour  Intake 6702.95 ml  Output 4550 ml  Net 2152.95 ml   Weight change: 1.3 kg  Physical Exam:  Gen: obese male, sedated, on vent. CVS: RRR, systolic murmur Resp: CTAB, on vent with upper respiratory noises transmitted, comfortable WOB Abd: soft, +BS Ext: persistent dusky L first fingers. Persistent 2+ pitting edema to all extremities up to forearm and knee respectively.  Imaging: Dg Chest Port 1 View  Result Date: 07/11/2018 CLINICAL DATA:  Acute respiratory failure. EXAM: PORTABLE CHEST 1 VIEW COMPARISON:  Radiograph July 10, 2018. FINDINGS: Stable cardiomegaly. Endotracheal and nasogastric tubes are unchanged in position. Stable bilateral internal jugular catheters are noted. No pneumothorax is noted. Mild bilateral midlung subsegmental atelectasis is noted. Mild right basilar subsegmental atelectasis is noted. Bony thorax is unremarkable. IMPRESSION: Bilateral midlung subsegmental atelectasis. Mild right basilar subsegmental atelectasis. Stable support apparatus. Electronically Signed   By: JMarijo Conception M.D.  On: 07/11/2018 07:54   Labs: BMET Recent Labs  Lab 07/08/18 0446 07/08/18 2359 07/09/18 0340 07/09/18 1609 07/10/18 0541 07/11/18 0218 07/11/18 1630 07/12/18 0440  NA 149* 140 141 143 145   146* 149* 146* 146*  K 3.2* 2.8* 4.0 5.1 3.3*  3.4* 3.3* 5.3* 3.0*  CL 121* 108 112* 111 113*  112* 116* 112* 113*  CO2 22 24 25 27 26  26 24 27 26   GLUCOSE 176* 229* 147* 146* 196*  196* 177* 150* 191*  BUN 27* 25* 23* 22* 19  19 33* 39* 46*  CREATININE 1.83* 1.39* 1.35* 1.26* 1.24  1.22 1.79* 1.97* 1.86*  CALCIUM 9.3 9.1 9.4 9.4 9.5  9.5 10.4* 10.2 10.4*  PHOS 2.8  --  2.6 2.3* 2.4*  2.4* 4.1 4.6 5.1*  5.0*   CBC Recent Labs  Lab 07/09/18 0340 07/10/18 0541 07/11/18 0218 07/12/18 0440  WBC 15.1* 15.1* 15.7* 12.4*  HGB 8.3* 8.9* 9.1* 8.0*  HCT 29.4* 31.6* 32.3* 28.7*  MCV 103.9* 104.6* 110.2* 107.9*  PLT 67* 89* 124* 122*    Medications:    . aspirin  81 mg Per Tube Daily  . chlorhexidine gluconate (MEDLINE KIT)  15 mL Mouth Rinse BID  . citalopram  10 mg Per Tube Daily  . clonazepam  0.5 mg Per Tube BID  . feeding supplement (PRO-STAT SUGAR FREE 64)  60 mL Per Tube QID  . finasteride  5 mg Oral Daily  . folic acid  1 mg Per Tube Daily  . free water  400 mL Per Tube Q4H  . lamoTRIgine  200 mg Per Tube QHS  . mouth rinse  15 mL Mouth Rinse 10 times per day  . multivitamin  15 mL Per Tube Daily  . nystatin   Topical BID  . pantoprazole sodium  40 mg Per Tube Daily  . QUEtiapine  50 mg Per Tube BID  . tamsulosin  0.8 mg Oral Daily  . thiamine  100 mg Per Tube Daily    Rory Percy, DO PGY2 07/12/2018, 7:01 AM

## 2018-07-12 NOTE — Progress Notes (Signed)
ANTICOAGULATION CONSULT NOTE   Pharmacy Consult for argatroban Indication: Cool hand / HIT  Patient Measurements: Height: 6\' 2"  (188 cm) Weight: (!) 303 lb 5.7 oz (137.6 kg) IBW/kg (Calculated) : 82.2  Vital Signs: Temp: 98.7 F (37.1 C) (11/06 0400) Temp Source: Oral (11/06 0400) BP: 108/61 (11/06 0742) Pulse Rate: 63 (11/06 0742)  Labs: Recent Labs    07/10/18 0541 07/11/18 0218  07/11/18 1522 07/11/18 1630 07/11/18 1819 07/12/18 0440  HGB 8.9* 9.1*  --   --   --   --  8.0*  HCT 31.6* 32.3*  --   --   --   --  28.7*  PLT 89* 124*  --   --   --   --  122*  APTT 50* 41*   < > 56*  --  51* 60*  CREATININE 1.24  1.22 1.79*  --   --  1.97*  --  1.86*   < > = values in this interval not displayed.   Assessment: 61 yoM initially on heparin SQ for VTE prophylaxis with platelet drop to 41. 4T score at least 5. HIT antibody positive, OD 2.7. SRA is positive. Patient with dusky/cool hand per MD assessment but no intervention by vascular at this point. Pharmacy consulted to start IV argatroban for r/o clot  Currently on IV argatroban at 0.86 mcg/kg/min. H/H down, Plt stable. APTT now up to 60. No bleeding noted.   Goal of Therapy:  aPTT 50-90 seconds Monitor platelets by anticoagulation protocol: Yes   Plan:  Continue argatroban at 0.48mcg/kg/min (4.52mL/hr) Daily aPTT/CBC Monitor for s/s of bleeding   Albertina Parr, PharmD., BCPS Clinical Pharmacist Clinical phone for 07/12/18 until 3:30pm: [phone] If after 3:30pm, please refer to [facility] for unit-specific pharmacist

## 2018-07-12 NOTE — Progress Notes (Signed)
RT note: patient placed on CPAP/PSV of 12/8 at 0740.  Currently tolerating well.  Will continue to monitor.

## 2018-07-13 ENCOUNTER — Inpatient Hospital Stay (HOSPITAL_COMMUNITY): Payer: 59

## 2018-07-13 DIAGNOSIS — N251 Nephrogenic diabetes insipidus: Secondary | ICD-10-CM

## 2018-07-13 DIAGNOSIS — G9340 Encephalopathy, unspecified: Secondary | ICD-10-CM

## 2018-07-13 LAB — RENAL FUNCTION PANEL
ANION GAP: 6 (ref 5–15)
Albumin: 2 g/dL — ABNORMAL LOW (ref 3.5–5.0)
Albumin: 2.1 g/dL — ABNORMAL LOW (ref 3.5–5.0)
Anion gap: 6 (ref 5–15)
BUN: 48 mg/dL — ABNORMAL HIGH (ref 6–20)
BUN: 47 mg/dL — ABNORMAL HIGH (ref 6–20)
CALCIUM: 10.7 mg/dL — AB (ref 8.9–10.3)
CALCIUM: 11.4 mg/dL — AB (ref 8.9–10.3)
CHLORIDE: 115 mmol/L — AB (ref 98–111)
CO2: 25 mmol/L (ref 22–32)
CO2: 26 mmol/L (ref 22–32)
CREATININE: 1.55 mg/dL — AB (ref 0.61–1.24)
Chloride: 120 mmol/L — ABNORMAL HIGH (ref 98–111)
Creatinine, Ser: 1.61 mg/dL — ABNORMAL HIGH (ref 0.61–1.24)
GFR calc Af Amer: 53 mL/min — ABNORMAL LOW (ref >60)
GFR calc Af Amer: 55 mL/min — ABNORMAL LOW (ref >60)
GFR calc non Af Amer: 46 mL/min — ABNORMAL LOW (ref >60)
GFR calc non Af Amer: 48 mL/min — ABNORMAL LOW (ref >60)
GLUCOSE: 156 mg/dL — AB (ref 70–99)
GLUCOSE: 146 mg/dL — AB (ref 70–99)
Phosphorus: 4.8 mg/dL — ABNORMAL HIGH (ref 2.5–4.6)
Phosphorus: 2.3 mg/dL — ABNORMAL LOW (ref 2.5–4.6)
Potassium: 3.7 mmol/L (ref 3.5–5.1)
Potassium: 3.3 mmol/L — ABNORMAL LOW (ref 3.5–5.1)
SODIUM: 146 mmol/L — AB (ref 135–145)
SODIUM: 152 mmol/L — AB (ref 135–145)

## 2018-07-13 LAB — GLUCOSE, CAPILLARY
GLUCOSE-CAPILLARY: 144 mg/dL — AB (ref 70–99)
GLUCOSE-CAPILLARY: 140 mg/dL — AB (ref 70–99)
GLUCOSE-CAPILLARY: 157 mg/dL — AB (ref 70–99)
GLUCOSE-CAPILLARY: 151 mg/dL — AB (ref 70–99)
GLUCOSE-CAPILLARY: 156 mg/dL — AB (ref 70–99)
GLUCOSE-CAPILLARY: 197 mg/dL — AB (ref 70–99)
GLUCOSE-CAPILLARY: 188 mg/dL — AB (ref 70–99)
Glucose-Capillary: 131 mg/dL — ABNORMAL HIGH (ref 70–99)
Glucose-Capillary: 145 mg/dL — ABNORMAL HIGH (ref 70–99)
Glucose-Capillary: 145 mg/dL — ABNORMAL HIGH (ref 70–99)
Glucose-Capillary: 148 mg/dL — ABNORMAL HIGH (ref 70–99)
Glucose-Capillary: 149 mg/dL — ABNORMAL HIGH (ref 70–99)
Glucose-Capillary: 150 mg/dL — ABNORMAL HIGH (ref 70–99)
Glucose-Capillary: 150 mg/dL — ABNORMAL HIGH (ref 70–99)
Glucose-Capillary: 157 mg/dL — ABNORMAL HIGH (ref 70–99)
Glucose-Capillary: 158 mg/dL — ABNORMAL HIGH (ref 70–99)
Glucose-Capillary: 158 mg/dL — ABNORMAL HIGH (ref 70–99)
Glucose-Capillary: 159 mg/dL — ABNORMAL HIGH (ref 70–99)
Glucose-Capillary: 163 mg/dL — ABNORMAL HIGH (ref 70–99)
Glucose-Capillary: 165 mg/dL — ABNORMAL HIGH (ref 70–99)
Glucose-Capillary: 175 mg/dL — ABNORMAL HIGH (ref 70–99)
Glucose-Capillary: 186 mg/dL — ABNORMAL HIGH (ref 70–99)
Glucose-Capillary: 215 mg/dL — ABNORMAL HIGH (ref 70–99)
Glucose-Capillary: 217 mg/dL — ABNORMAL HIGH (ref 70–99)

## 2018-07-13 LAB — CBC
HCT: 30.2 % — ABNORMAL LOW (ref 39.0–52.0)
HEMOGLOBIN: 8.3 g/dL — AB (ref 13.0–17.0)
MCH: 29.9 pg (ref 26.0–34.0)
MCHC: 27.5 g/dL — AB (ref 30.0–36.0)
MCV: 108.6 fL — ABNORMAL HIGH (ref 80.0–100.0)
PLATELETS: 135 10*3/uL — AB (ref 150–400)
RBC: 2.78 MIL/uL — ABNORMAL LOW (ref 4.22–5.81)
RDW: 16 % — ABNORMAL HIGH (ref 11.5–15.5)
WBC: 12.4 10*3/uL — AB (ref 4.0–10.5)
nRBC: 0.6 % — ABNORMAL HIGH (ref 0.0–0.2)

## 2018-07-13 LAB — BASIC METABOLIC PANEL
Anion gap: 6 (ref 5–15)
BUN: 49 mg/dL — ABNORMAL HIGH (ref 6–20)
CHLORIDE: 116 mmol/L — AB (ref 98–111)
CO2: 25 mmol/L (ref 22–32)
CREATININE: 1.61 mg/dL — AB (ref 0.61–1.24)
Calcium: 10.7 mg/dL — ABNORMAL HIGH (ref 8.9–10.3)
GFR calc non Af Amer: 46 mL/min — ABNORMAL LOW (ref >60)
GFR, EST AFRICAN AMERICAN: 53 mL/min — AB (ref >60)
Glucose, Bld: 157 mg/dL — ABNORMAL HIGH (ref 70–99)
Potassium: 3.6 mmol/L (ref 3.5–5.1)
Sodium: 147 mmol/L — ABNORMAL HIGH (ref 135–145)

## 2018-07-13 LAB — MAGNESIUM: Magnesium: 2.9 mg/dL — ABNORMAL HIGH (ref 1.7–2.4)

## 2018-07-13 LAB — APTT: APTT: 61 s — AB (ref 24–36)

## 2018-07-13 LAB — PHOSPHORUS: PHOSPHORUS: 4.8 mg/dL — AB (ref 2.5–4.6)

## 2018-07-13 MED ORDER — ORAL CARE MOUTH RINSE
15.0000 mL | Freq: Two times a day (BID) | OROMUCOSAL | Status: DC
  Administered 2018-07-13 – 2018-07-14 (×5): 15 mL via OROMUCOSAL

## 2018-07-13 MED ORDER — CHLORHEXIDINE GLUCONATE CLOTH 2 % EX PADS
6.0000 | MEDICATED_PAD | Freq: Every morning | CUTANEOUS | Status: DC
  Administered 2018-07-13 – 2018-07-14 (×2): 6 via TOPICAL

## 2018-07-13 MED ORDER — CHLORHEXIDINE GLUCONATE 0.12 % MT SOLN
15.0000 mL | Freq: Two times a day (BID) | OROMUCOSAL | Status: DC
  Administered 2018-07-13 – 2018-07-14 (×2): 15 mL via OROMUCOSAL

## 2018-07-13 NOTE — Progress Notes (Signed)
[facility] Progress Note    Assessment/ Plan:   57yo male with PMH of bipolar disorder on lithium, T2DM, HTN, prostate cancer, OSA on CPAP presented with AMS and frequent falls due to acute on chronic lithium toxicity, now s/p HD x1 10/18 with resolution of lithium toxicity. Continued to have agitation, fever, started on IV abx with ID in consult and initiated CRRT 11/1-11/4 for continued metabolic abnormalities and possible uremia with improvement in metabolic abnormalities, however continued to have encephalopathy.  1. AKI due to acute on chronic lithium toxicity - Lithium toxicity now resolved. S/p CRRT (11/1-11/4), Cr improved 1.61 this am, GFR 46. UOP 1.6 ml/kg/hr with net +1.6L last 24 hours with increase in IVF rate for hypernatremia. Weight up 1lb. Improving off CRRT, will continue IV hydration to match outs, but will continue to maintain access. 2. Hypernatremia - likely due to nephrogenic DI given chronic lithium use. No response to previous high dose desmopressin x2, indicating partial DI with previous inability to initiate thiazides d/t AKI. Continues on D5W @ 159m/hr and free water flushes and insulin gtt, started amiloride yesterday. Na stable at 146 this am. 3. HTN/volume status - low normal BP, off pressors. Pitting edema improving, 1+ to all extremities.  4. Electrolytes - K 3.7 Na 146 Co2 25 Cl 115 Phos 4.8 Calcium 10.7. Repleting as necessary. Could consider phosphate binder. 5. Macrocytic Anemia - Hb 8.3, stable. Macrocytosis developed during admission, unclear etiology. Drop during admission likely due to initiation of CRRT and clotting x3 despite argatroban for presumed HIT. Plts stable 135. 6. AMS - improved. Alerts to voice this am with some purposeful movement. Remains intubated at time of exam. 7. ID - per primary and ID. No longer febrile. Previously on unasyn, fluconazole (d/c 11/4) for penile discharge and +cultures coag neg Staph (?contaminant) and penile  cultures staph lugdunensis and E faecalis.  8. H/o BOO - urinary catheter placed, continues on flomax and proscar.   Subjective:   Alerts to voice this am with some purposeful movements.   Objective:   BP 105/61   Pulse 80   Temp 99.1 F (37.3 C) (Axillary)   Resp (!) 21   Ht _0  (1.88 m)   Wt (!) 138.2 kg   SpO2 100%   BMI 39.12 kg/m   Intake/Output Summary (Last 24 hours) at 07/13/2018 0749 Last data filed at 07/13/2018 06314Gross per 24 hour  Intake 7007.27 ml  Output 5350 ml  Net 1657.27 ml   Weight change: 0.6 kg  Physical Exam:  Gen: obese male, on vent, alerts to voice CVS: RRR Resp: CTAB on vent Abd: soft, +BS Ext: persistent dusky L first fingers. Pitting edema improved, 1+ to all extremities.  Imaging: Dg Chest Port 1 View  Result Date: 07/12/2018 CLINICAL DATA:  Respiratory failure.  Ventilator patient. EXAM: PORTABLE CHEST 1 VIEW COMPARISON:  07/11/2018 and 07/10/2018. FINDINGS: 0548 hours. Endotracheal tube, orogastric tube and bilateral central lines appear unchanged in position. There is mildly increased subsegmental atelectasis at both lung bases. There is stable mild vascular congestion, and no pleural effusion or pneumothorax. The heart size and mediastinal contours are stable. IMPRESSION: Mildly increased atelectasis at both lung bases. Stable support system. Electronically Signed   By: WRichardean SaleM.D.   On: 07/12/2018 09:44   Labs: BMET Recent Labs  Lab 07/09/18 0340 07/09/18 1609 07/10/18 0541 07/11/18 0218 07/11/18 1630 07/12/18 0440 07/13/18 0454  NA 141 143 145  146* 149* 146* 146* 146*  147*  K 4.0 5.1 3.3*  3.4* 3.3* 5.3* 3.0* 3.7  3.6  CL 112* 111 113*  112* 116* 112* 113* 115*  116*  CO2 _0 GLUCOSE 147* 146* 196*  196* 177* 150* 191* 156*  157*  BUN 23* 22* 19  19 33* 39* 46* 48*  49*  CREATININE 1.35* 1.26* 1.24  1.22 1.79* 1.97* 1.86* 1.61*  1.61*  CALCIUM 9.4 9.4 9.5  9.5 10.4* 10.2  10.4* 10.7*  10.7*  PHOS 2.6 2.3* 2.4*  2.4* 4.1 4.6 5.1*  5.0* 4.8*  4.8*   CBC Recent Labs  Lab 07/10/18 0541 07/11/18 0218 07/12/18 0440 07/13/18 0454  WBC 15.1* 15.7* 12.4* 12.4*  HGB 8.9* 9.1* 8.0* 8.3*  HCT 31.6* 32.3* 28.7* 30.2*  MCV 104.6* 110.2* 107.9* 108.6*  PLT 89* 124* 122* 135*    Medications:    . aMILoride  5 mg Oral Daily  . aspirin  81 mg Per Tube Daily  . chlorhexidine gluconate (MEDLINE KIT)  15 mL Mouth Rinse BID  . citalopram  40 mg Per Tube Daily  . clonazepam  1 mg Per Tube BID  . feeding supplement (PRO-STAT SUGAR FREE 64)  60 mL Per Tube QID  . finasteride  5 mg Oral Daily  . folic acid  1 mg Per Tube Daily  . free water  400 mL Per Tube Q4H  . lamoTRIgine  200 mg Per Tube QHS  . mouth rinse  15 mL Mouth Rinse 10 times per day  . methadone  10 mg Oral Q12H  . multivitamin  15 mL Per Tube Daily  . nystatin   Topical BID  . pantoprazole sodium  40 mg Per Tube Daily  . QUEtiapine  100 mg Per Tube BID  . tamsulosin  0.8 mg Oral Daily  . thiamine  100 mg Per Tube Daily    Rory Percy, DO PGY2 07/13/2018, 7:49 AM

## 2018-07-13 NOTE — Progress Notes (Signed)
NAME:  Frank Cox, MRN:  [id number], DOB:  [birth year], LOS: 45 ADMISSION DATE:  06/17/2018, CONSULTATION DATE:  06/16/2018 REFERRING MD:  Stark Jock, ED [facility], CHIEF COMPLAINT:  Alerted mental status.  Brief History   57 year old man who requires urgent hemodialysis for acute on chronic lithium toxicity. Has been increasingly confused, experiencing multiple falls over the last week.  Li level 2.37 (upper limit 1.2) and nephrology wishes to dialyze the patient.   He was transferred out of ICU on 10/20.  On 10/24, he had agitation that did not respond to ativan or haldol; therefore, PCCM was called back for transfer to ICU.  Past Medical History  Bipolar affective disorder, T2DM on OHA, HTN, Prostate cancer, OSA on CPAP.  Significant Hospital Events   10/18 Admit with acute renal failure (cr 1.61) Foley catheter > 2 L immediately.  Underwent emergent dialysis for lithium toxicity 10/19 Lithium normalized.   10/20 Tx to SDU 10/24 Tx back to ICU due to agitation 10/26 PCCM called back for AMS, intermittent agitation 10/28 Intubated  11/4 CVVHD held 07/11/2018 increase FiO2 needs treated with Lasix twice during the night  Consults: date of consult/date signed off & final recs:  Nephrology 10/18 - recommending HD Poison Control - recommending hydration, following serial Li levels. Psych 10/19 - do not restart lithium, needs psych consult once encephalopathy clears, recommending inpatient psych admission once medically cleared Urology 10/19 - input regarding possible obstructive uropathy, they recommend keeping foley in and follow up as outpatient 10/29 ID consult for fever 10/31 vascular surgery consult> R hand dusky with HIT  Procedures (surgical and bedside):  10/29 LIJ CVC  11/1 RIJ HD Cath, started CRRT   Significant Diagnostic Tests:  CT head 10/15 > negative. Renal US 10/18 > diabetic glomerular nephropathy.  No obstruction.  Bilateral benign appearing renal cyts.  CT Head 10/29 >>  Neg for acute process, enlarge orbital vein? CT Abd Pelvis 10/29 >> Negative for infectious source, fluid in colon   EEG 10/29 >>> slowing, no seizure   MRI Brain 11/1 >>> unremarkable   Micro Data:  Urine 10/18 > negative Blood 10/20 > negative Blood 10/23 > negative  Urine 10/23 > negative Penile culture 10/22 >> staph lugdunensis (S-, entercoccus (vanco sens) UCx 10/28 >> NGTD  Blood 10/28 >> Coag neg staph 1/4   Trach Asp 10/28 >> NGTD    Antimicrobials:  Zosyn 10/20 > 10/21 Ceftriaxone 10/21 > 10/23 Unasyn 10/26 >>> off Oral Vanco 10/28, c diff neg, stopped   Cefepime 10/28 >> 10/31 Vanco 10/28 >> 10/31 Metronidazole 10/28, stopped Fluconazole 10/29 >> off  Subjective:  We will attempt extubation If fails will need tracheostomy in the future  Objective   Blood pressure 105/61, pulse 80, temperature 99.1 F (37.3 C), temperature source Axillary, resp. rate (!) 21, height 6\' 2"  (1.88 m), weight (!) 138.2 kg, SpO2 100 %.    Vent Mode: PSV;CPAP FiO2 (%):  [40 %] 40 % Set Rate:  [16 bmp] 16 bmp PEEP:  [8 cmH20] 8 cmH20 Pressure Support:  [8 BTD17-61 cmH20] 8 cmH20 Plateau Pressure:  [18 cmH20-23 cmH20] 18 cmH20   Intake/Output Summary (Last 24 hours) at 07/13/2018 0840 Last data filed at 07/13/2018 0618 Gross per 24 hour  Intake 7007.27 ml  Output 4850 ml  Net 2157.27 ml   Filed Weights   07/11/18 0500 07/12/18 0500 07/13/18 0439  Weight: (!) 136.3 kg (!) 137.6 kg (!) 138.2 kg   Examination: General: Morbidly obese  male who sedated on ventilator HEENT: Endotracheal tube to vent, orogastric tube in place with tube feedings Neuro: Heavily sedated on mechanical auditory support CV: s1s2 rrr, no m/r/g PULM: even/non-labored, lungs bilaterally coarse rhonchi FG:HWEX, non-tender, bsx4 active  Extremities: warm/dry, 3+ edema, extremities with chronic venous stasis.  Left TM and index finger ischemic changes and necrosis Skin: Areas of ecchymosis   Assessment &  Plan:   Septic Shock, unclear source, possible aspiration after being obtunded due to AMS, small right sided infiltrate, could represent atelectasis; prior cx with staph and enterococcus from penile discharge, one blood cx +methicillin resistant staph species(suspect contaminant)    No fever last 24 hours P: Resolved   Acute Hypoxemic and Hypercarbic Respiratory Failure, principally due to altered mental status and poor airway protection, also some component of interstitial infiltrates P: Attempt extubation 07/13/2017 He feels he will need a tracheostomy in the near  Acute toxic, metabolic encephalopathy secondary to above  Agitated Delirium   - EEG with no seizure  -Metabolic disarray improved, lithium improved.  Suspect that delirium is now enlarged part due to his need for sedating medications P:  I we will increase his Klonopin and Seroquel doses Started on methadone 07/12/2018 Medication once he is extubated     acute on Chronic Lithium Toxicity with Acute Toxic Encephalopathy  Suspected nephrogenic DI, hypernatremia P: Follow electrolytes and treat as needed  Bipolar Disorder  - patient denied suicidal intent.   - S/p psych eval 10/19 P:  Seroquel and Klonopin were increased on 07/12/2018 Started on methadone 07/12/2018  Probable OSA / OHS P: Contributing factor to difficulty weaning.  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 Tresa Moore, h/o of radiation tx for prostate Ca -spoke with urology, ok for nursing to change foley routine  P: Foley Urology consult   Macrocytic Anemia  Recent Labs    07/12/18 0440 07/13/18 0454  HGB 8.0* 8.3*    Monitor CBC   P: Following CBC   Thrombocytopenia, platelets  are 122 on 07/12/2018 Positive HIT Ab Probable HIT, SRA 101 P: Follow CBC while on anticoagulation No serotonin levels and can panel positive  DM Hyperglycemia  CBG (last 3)  Recent Labs    07/13/18 0343 07/13/18 0522 07/13/18 0617  GLUCAP 144* 140* 148*    P: Continue treatment with insulin Once hyponatremia is improved we will try to stop D5W  Dusky left index and middle fingers, pulses intact Continue anticoagulation with argatroban Vascular surgery has signed off  Disposition / Summary of Today's Plan 07/13/18   Hyponatremia remains without significant change Meets criteria for attempted extubation on 07/13/2018 Change OG tube to nasogastric tube to continue to give psychotropic medications are free water Will need to continue anticoagulation with argatroban.     Diet: TF  Pain/Anxiety/Delirium protocol (if indicated): Yes  VAP protocol (if indicated): Yes  DVT prophylaxis: argatroban  GI prophylaxis: N/A. Hyperglycemia protocol: insulin gtt Mobility: Bedrest.  Once extubated will need physical therapy and Occupational Therapy consult Code Status: full. Family Communication: 01/09/2018 father was called per physician and fully updated.  Labs (personally reviewed)  CBC: Recent Labs  Lab 07/09/18 0340 07/10/18 0541 07/11/18 0218 07/12/18 0440 07/13/18 0454  WBC 15.1* 15.1* 15.7* 12.4* 12.4*  HGB 8.3* 8.9* 9.1* 8.0*  8.3*  HCT 29.4* 31.6* 32.3* 28.7* 30.2*  MCV 103.9* 104.6* 110.2* 107.9* 108.6*  PLT 67* 89* 124* 122* 135*    Basic Metabolic Panel: Recent Labs  Lab 07/09/18 0340  07/10/18 0541 07/11/18 0218 07/11/18 1630 07/12/18 0440 07/13/18 0454  NA 141   < > 145  146* 149* 146* 146* 146*  147*  K 4.0   < > 3.3*  3.4* 3.3* 5.3* 3.0* 3.7  3.6  CL 112*   < > 113*  112* 116* 112* 113* 115*  116*  CO2 25   < > 26  26 24 27 26 25  25   GLUCOSE 147*   < > 196*  196* 177* 150* 191* 156*  157*  BUN 23*   < > 19  19 33* 39* 46* 48*  49*    CREATININE 1.35*   < > 1.24  1.22 1.79* 1.97* 1.86* 1.61*  1.61*  CALCIUM 9.4   < > 9.5  9.5 10.4* 10.2 10.4* 10.7*  10.7*  MG 2.5*  --  2.7* 3.0*  --  2.7* 2.9*  PHOS 2.6   < > 2.4*  2.4* 4.1 4.6 5.1*  5.0* 4.8*  4.8*   < > = values in this interval not displayed.   GFR: Estimated Creatinine Clearance: 74 mL/min (A) (by C-G formula based on SCr of 1.61 mg/dL (H)). Recent Labs  Lab 07/10/18 0541 07/11/18 0218 07/12/18 0440 07/13/18 0454  WBC 15.1* 15.7* 12.4* 12.4*    Liver Function Tests: Recent Labs  Lab 07/10/18 0541 07/11/18 0218 07/11/18 1630 07/12/18 0440 07/13/18 0454  ALBUMIN 2.3* 2.3* 2.1* 2.1* 2.0*   No results for input(s): LIPASE, AMYLASE in the last 168 hours. No results for input(s): AMMONIA in the last 168 hours.  ABG    Component Value Date/Time   PHART 7.378 07/11/2018 0954   PCO2ART 41.8 07/11/2018 0954   PO2ART 71.0 (L) 07/11/2018 0954   HCO3 24.4 07/11/2018 0954   TCO2 26 07/11/2018 0954   ACIDBASEDEF 1.0 07/11/2018 0954   O2SAT 93.0 07/11/2018 0954     Coagulation Profile: Recent Labs  Lab 07/06/18 1156  INR 1.54    Cardiac Enzymes: No results for input(s): CKTOTAL, CKMB, CKMBINDEX, TROPONINI in the last 168 hours.    CBG: Recent Labs  Lab 07/13/18 0215 07/13/18 0303 07/13/18 0343 07/13/18 0522 07/13/18 0617  GLUCAP 150* 158* 144* 140* 148*    App cct 45 min  Richardson Landry Minor ACNP [facility] P[facility]M Pager [phone] till 1 pm If no answer page [phone] 07/13/2018, 8:40 AM

## 2018-07-13 NOTE — Procedures (Signed)
Extubation Procedure Note  Patient Details:   Name: Frank Cox DOB: [birth year] MRN: [id number]   Airway Documentation:    Vent end date: 07/13/18 Vent end time: 0900   Evaluation  O2 sats: stable throughout Complications: No apparent complications Patient did tolerate procedure well. Bilateral Breath Sounds: Rhonchi   Yes   Patient extubated to 6L nasal cannula per MD order.  Positive cuff leak noted.  No evidence of stridor.  Sats currently 93%.  Vitals are stable.  Will continue to monitor.   Alphia Moh N 07/13/2018, 9:09 AM

## 2018-07-13 NOTE — Progress Notes (Signed)
Xray shows ng in gastroesophageal junction. MD paged. Orders to advance tube 10cm given

## 2018-07-13 NOTE — Progress Notes (Signed)
Attending:    Subjective: Quiet night Passing SBT this morning   Objective: Vitals:   07/13/18 0439 07/13/18 0500 07/13/18 0600 07/13/18 0724  BP:  101/60 114/63 105/61  Pulse:  69 69 80  Resp:  17 16 (!) 21  Temp:      TempSrc:      SpO2:  96% 96% 100%  Weight: (!) 138.2 kg     Height:       Vent Mode: PSV;CPAP FiO2 (%):  [40 %] 40 % Set Rate:  [16 bmp] 16 bmp PEEP:  [8 cmH20] 8 cmH20 Pressure Support:  [8 cmH20-12 cmH20] 8 cmH20 Plateau Pressure:  [18 cmH20-23 cmH20] 18 cmH20  Intake/Output Summary (Last 24 hours) at 07/13/2018 0856 Last data filed at 07/13/2018 0618 Gross per 24 hour  Intake 7007.27 ml  Output 4850 ml  Net 2157.27 ml    General:  In bed on vent HENT: NCAT ETT in place PULM: CTA B, vent supported breathing CV: RRR, no mgr GI: BS+, soft, nontender MSK: normal bulk and tone Neuro: awake on vent, nods head, awake, moves to command     CBC    Component Value Date/Time   WBC 12.4 (H) 07/13/2018 0454   RBC 2.78 (L) 07/13/2018 0454   HGB 8.3 (L) 07/13/2018 0454   HCT 30.2 (L) 07/13/2018 0454   PLT 135 (L) 07/13/2018 0454   MCV 108.6 (H) 07/13/2018 0454   MCH 29.9 07/13/2018 0454   MCHC 27.5 (L) 07/13/2018 0454   RDW 16.0 (H) 07/13/2018 0454   LYMPHSABS 1.8 07/05/2018 0544   MONOABS 1.3 (H) 07/05/2018 0544   EOSABS 0.0 07/05/2018 0544   BASOSABS 0.0 07/05/2018 0544    BMET    Component Value Date/Time   NA 146 (H) 07/13/2018 0454   NA 147 (H) 07/13/2018 0454   K 3.7 07/13/2018 0454   K 3.6 07/13/2018 0454   CL 115 (H) 07/13/2018 0454   CL 116 (H) 07/13/2018 0454   CO2 25 07/13/2018 0454   CO2 25 07/13/2018 0454   GLUCOSE 156 (H) 07/13/2018 0454   GLUCOSE 157 (H) 07/13/2018 0454   BUN 48 (H) 07/13/2018 0454   BUN 49 (H) 07/13/2018 0454   CREATININE 1.61 (H) 07/13/2018 0454   CREATININE 1.61 (H) 07/13/2018 0454   CALCIUM 10.7 (H) 07/13/2018 0454   CALCIUM 10.7 (H) 07/13/2018 0454   GFRNONAA 46 (L) 07/13/2018 0454   GFRNONAA 46  (L) 07/13/2018 0454   GFRAA 53 (L) 07/13/2018 0454   GFRAA 53 (L) 07/13/2018 0454    CXR images yesterday: atelecatsis bases  Impression/Plan:  AKI/Hypernatremia/nephrogenic DI from lithium: D5/insulin per nephrology, monitor UOP Acute respiratory failure with hypoxemia: passing SBT, mental status improved, extubate today, would like to avoid tracheostomy Acute encephalopathy: stop propofol, continue celexa, continue methadone, continue clonazepam, continue lamictal, seroquel   I discussed the situation with his father yesterday and explained that he is high risk for needing a tracheostomy.  His father indicated that his son has struggled with medical problems for years.  He indicated that he would be OK with a tracheostomy if needed.  It wasn't clear to me that he fully understands the ramifications of needing a tracheostomy and ongoing hospital care.     My cc time 35 minutes  Roselie Awkward, MD [place] [facility] Pager: [phone] Cell: [phone] After 3pm or if no response, call [phone]

## 2018-07-13 NOTE — Progress Notes (Signed)
ANTICOAGULATION CONSULT NOTE   Pharmacy Consult for argatroban Indication: Cool hand / HIT  Patient Measurements: Height: 6\' 2"  (188 cm) Weight: (!) 304 lb 10.8 oz (138.2 kg) IBW/kg (Calculated) : 82.2  Vital Signs: Temp: 99.1 F (37.3 C) (11/07 0400) Temp Source: Axillary (11/07 0400) BP: 105/61 (11/07 0724) Pulse Rate: 80 (11/07 0724)  Labs: Recent Labs    07/11/18 0218  07/11/18 1630 07/11/18 1819 07/12/18 0440 07/13/18 0454  HGB 9.1*  --   --   --  8.0* 8.3*  HCT 32.3*  --   --   --  28.7* 30.2*  PLT 124*  --   --   --  122* 135*  APTT 41*   < >  --  51* 60* 61*  CREATININE 1.79*  --  1.97*  --  1.86* 1.61*  1.61*   < > = values in this interval not displayed.   Assessment: 52 yoM initially on heparin SQ for VTE prophylaxis with platelet drop to 41. 4T score at least 5. HIT antibody positive, OD 2.7. SRA is positive. Patient with dusky/cool hand per MD assessment but no intervention by vascular at this point. Pharmacy consulted to start IV argatroban for r/o clot  Currently on IV argatroban at 0.86 mcg/kg/min. H/H low stable, Plt stable. APTT stable at 61. No overt s/s of bleeding noted.   Goal of Therapy:  aPTT 50-90 seconds Monitor platelets by anticoagulation protocol: Yes   Plan:  Continue argatroban at 0.17mcg/kg/min (4.65mL/hr) Daily aPTT/CBC Monitor for s/s of bleeding   Albertina Parr, PharmD., BCPS Clinical Pharmacist Clinical phone for 07/13/18 until 3:30pm: [id number] If after 3:30pm, please refer to [facility] for unit-specific pharmacist

## 2018-07-13 NOTE — Progress Notes (Signed)
RT note: patient placed on CPAP/PSV at 0724 and is currently tolerating well.  Will continue to monitor.

## 2018-07-14 ENCOUNTER — Inpatient Hospital Stay (HOSPITAL_COMMUNITY): Payer: 59

## 2018-07-14 LAB — RENAL FUNCTION PANEL
ANION GAP: 6 (ref 5–15)
ANION GAP: 9 (ref 5–15)
Albumin: 2.2 g/dL — ABNORMAL LOW (ref 3.5–5.0)
Albumin: 2.1 g/dL — ABNORMAL LOW (ref 3.5–5.0)
BUN: 45 mg/dL — AB (ref 6–20)
BUN: 51 mg/dL — AB (ref 6–20)
CHLORIDE: 124 mmol/L — AB (ref 98–111)
CHLORIDE: 120 mmol/L — AB (ref 98–111)
CO2: 23 mmol/L (ref 22–32)
CO2: 22 mmol/L (ref 22–32)
Calcium: 11.5 mg/dL — ABNORMAL HIGH (ref 8.9–10.3)
Calcium: 11.4 mg/dL — ABNORMAL HIGH (ref 8.9–10.3)
Creatinine, Ser: 1.58 mg/dL — ABNORMAL HIGH (ref 0.61–1.24)
Creatinine, Ser: 1.61 mg/dL — ABNORMAL HIGH (ref 0.61–1.24)
GFR calc Af Amer: 54 mL/min — ABNORMAL LOW (ref >60)
GFR calc Af Amer: 53 mL/min — ABNORMAL LOW (ref >60)
GFR calc non Af Amer: 47 mL/min — ABNORMAL LOW (ref >60)
GFR calc non Af Amer: 46 mL/min — ABNORMAL LOW (ref >60)
GLUCOSE: 159 mg/dL — AB (ref 70–99)
GLUCOSE: 173 mg/dL — AB (ref 70–99)
POTASSIUM: 3.2 mmol/L — AB (ref 3.5–5.1)
POTASSIUM: 3.8 mmol/L (ref 3.5–5.1)
Phosphorus: 2.7 mg/dL (ref 2.5–4.6)
Phosphorus: 3.6 mg/dL (ref 2.5–4.6)
Sodium: 153 mmol/L — ABNORMAL HIGH (ref 135–145)
Sodium: 151 mmol/L — ABNORMAL HIGH (ref 135–145)

## 2018-07-14 LAB — GLUCOSE, CAPILLARY
GLUCOSE-CAPILLARY: 151 mg/dL — AB (ref 70–99)
GLUCOSE-CAPILLARY: 154 mg/dL — AB (ref 70–99)
GLUCOSE-CAPILLARY: 178 mg/dL — AB (ref 70–99)
GLUCOSE-CAPILLARY: 148 mg/dL — AB (ref 70–99)
GLUCOSE-CAPILLARY: 153 mg/dL — AB (ref 70–99)
GLUCOSE-CAPILLARY: 140 mg/dL — AB (ref 70–99)
GLUCOSE-CAPILLARY: 148 mg/dL — AB (ref 70–99)
Glucose-Capillary: 113 mg/dL — ABNORMAL HIGH (ref 70–99)
Glucose-Capillary: 127 mg/dL — ABNORMAL HIGH (ref 70–99)
Glucose-Capillary: 137 mg/dL — ABNORMAL HIGH (ref 70–99)
Glucose-Capillary: 139 mg/dL — ABNORMAL HIGH (ref 70–99)
Glucose-Capillary: 143 mg/dL — ABNORMAL HIGH (ref 70–99)
Glucose-Capillary: 147 mg/dL — ABNORMAL HIGH (ref 70–99)
Glucose-Capillary: 157 mg/dL — ABNORMAL HIGH (ref 70–99)
Glucose-Capillary: 160 mg/dL — ABNORMAL HIGH (ref 70–99)
Glucose-Capillary: 169 mg/dL — ABNORMAL HIGH (ref 70–99)
Glucose-Capillary: 173 mg/dL — ABNORMAL HIGH (ref 70–99)
Glucose-Capillary: 191 mg/dL — ABNORMAL HIGH (ref 70–99)
Glucose-Capillary: 196 mg/dL — ABNORMAL HIGH (ref 70–99)
Glucose-Capillary: 240 mg/dL — ABNORMAL HIGH (ref 70–99)

## 2018-07-14 LAB — POCT I-STAT 3, ART BLOOD GAS (G3+)
ACID-BASE DEFICIT: 3 mmol/L — AB (ref 0.0–2.0)
Acid-base deficit: 3 mmol/L — ABNORMAL HIGH (ref 0.0–2.0)
BICARBONATE: 20.8 mmol/L (ref 20.0–28.0)
Bicarbonate: 23.1 mmol/L (ref 20.0–28.0)
O2 SAT: 100 %
O2 Saturation: 94 %
PCO2 ART: 34 mmHg (ref 32.0–48.0)
PH ART: 7.334 — AB (ref 7.350–7.450)
Patient temperature: 101.5
TCO2: 22 mmol/L (ref 22–32)
TCO2: 24 mmol/L (ref 22–32)
pCO2 arterial: 43.5 mmHg (ref 32.0–48.0)
pH, Arterial: 7.4 (ref 7.350–7.450)
pO2, Arterial: 220 mmHg — ABNORMAL HIGH (ref 83.0–108.0)
pO2, Arterial: 78 mmHg — ABNORMAL LOW (ref 83.0–108.0)

## 2018-07-14 LAB — CBC
HEMATOCRIT: 32.4 % — AB (ref 39.0–52.0)
Hemoglobin: 8.7 g/dL — ABNORMAL LOW (ref 13.0–17.0)
MCH: 29 pg (ref 26.0–34.0)
MCHC: 26.9 g/dL — AB (ref 30.0–36.0)
MCV: 108 fL — ABNORMAL HIGH (ref 80.0–100.0)
Platelets: 185 10*3/uL (ref 150–400)
RBC: 3 MIL/uL — ABNORMAL LOW (ref 4.22–5.81)
RDW: 15.7 % — AB (ref 11.5–15.5)
WBC: 15.1 10*3/uL — AB (ref 4.0–10.5)
nRBC: 0.3 % — ABNORMAL HIGH (ref 0.0–0.2)

## 2018-07-14 LAB — MAGNESIUM: MAGNESIUM: 3.1 mg/dL — AB (ref 1.7–2.4)

## 2018-07-14 LAB — OSMOLALITY: Osmolality: 338 mOsm/kg (ref 275–295)

## 2018-07-14 LAB — APTT: aPTT: 52 seconds — ABNORMAL HIGH (ref 24–36)

## 2018-07-14 MED ORDER — MIDAZOLAM HCL 2 MG/2ML IJ SOLN
1.0000 mg | INTRAMUSCULAR | Status: DC | PRN
  Administered 2018-07-14 – 2018-07-17 (×6): 1 mg via INTRAVENOUS
  Filled 2018-07-14 (×4): qty 2

## 2018-07-14 MED ORDER — DEXTROSE 5 % IV BOLUS
500.0000 mL | Freq: Once | INTRAVENOUS | Status: AC
  Administered 2018-07-14: 500 mL via INTRAVENOUS

## 2018-07-14 MED ORDER — ETOMIDATE 2 MG/ML IV SOLN
0.3000 mg/kg | Freq: Once | INTRAVENOUS | Status: AC
  Administered 2018-07-14: 20 mg via INTRAVENOUS

## 2018-07-14 MED ORDER — POTASSIUM CHLORIDE 20 MEQ/15ML (10%) PO SOLN
40.0000 meq | Freq: Once | ORAL | Status: AC
  Administered 2018-07-14: 40 meq
  Filled 2018-07-14: qty 30

## 2018-07-14 MED ORDER — ORAL CARE MOUTH RINSE
15.0000 mL | OROMUCOSAL | Status: DC
  Administered 2018-07-14 – 2018-07-17 (×27): 15 mL via OROMUCOSAL

## 2018-07-14 MED ORDER — CHLORHEXIDINE GLUCONATE CLOTH 2 % EX PADS
6.0000 | MEDICATED_PAD | Freq: Every day | CUTANEOUS | Status: DC
  Administered 2018-07-16 – 2018-07-18 (×3): 6 via TOPICAL

## 2018-07-14 MED ORDER — MIDAZOLAM HCL 2 MG/2ML IJ SOLN
2.0000 mg | Freq: Once | INTRAMUSCULAR | Status: AC
  Administered 2018-07-14: 2 mg via INTRAVENOUS

## 2018-07-14 MED ORDER — CHLORHEXIDINE GLUCONATE 0.12% ORAL RINSE (MEDLINE KIT)
15.0000 mL | Freq: Two times a day (BID) | OROMUCOSAL | Status: DC
  Administered 2018-07-14 – 2018-07-18 (×8): 15 mL via OROMUCOSAL

## 2018-07-14 MED ORDER — FENTANYL BOLUS VIA INFUSION
50.0000 ug | INTRAVENOUS | Status: DC | PRN
  Filled 2018-07-14: qty 50

## 2018-07-14 MED ORDER — MIDAZOLAM HCL 2 MG/2ML IJ SOLN
1.0000 mg | INTRAMUSCULAR | Status: DC | PRN
  Administered 2018-07-17: 1 mg via INTRAVENOUS
  Filled 2018-07-14 (×2): qty 2

## 2018-07-14 MED ORDER — POTASSIUM CHLORIDE 20 MEQ/15ML (10%) PO SOLN
20.0000 meq | Freq: Once | ORAL | Status: AC
  Administered 2018-07-14: 20 meq
  Filled 2018-07-14: qty 15

## 2018-07-14 MED ORDER — AMILORIDE HCL 5 MG PO TABS
10.0000 mg | ORAL_TABLET | Freq: Every day | ORAL | Status: DC
  Administered 2018-07-14 – 2018-07-17 (×4): 10 mg via ORAL
  Filled 2018-07-14 (×4): qty 2

## 2018-07-14 MED ORDER — FENTANYL 2500MCG IN NS 250ML (10MCG/ML) PREMIX INFUSION
400.0000 ug/h | INTRAVENOUS | Status: DC
  Administered 2018-07-14: 225 ug/h via INTRAVENOUS
  Administered 2018-07-14: 100 ug/h via INTRAVENOUS
  Administered 2018-07-15 – 2018-07-16 (×3): 225 ug/h via INTRAVENOUS
  Administered 2018-07-17: 400 ug/h via INTRAVENOUS
  Administered 2018-07-17: 275 ug/h via INTRAVENOUS
  Administered 2018-07-17: 150 ug/h via INTRAVENOUS
  Administered 2018-07-18 (×2): 400 ug/h via INTRAVENOUS
  Filled 2018-07-14 (×10): qty 250

## 2018-07-14 MED ORDER — FENTANYL CITRATE (PF) 100 MCG/2ML IJ SOLN
100.0000 ug | Freq: Once | INTRAMUSCULAR | Status: AC
  Administered 2018-07-14: 100 ug via INTRAVENOUS

## 2018-07-14 MED ORDER — FREE WATER
500.0000 mL | Status: DC
  Administered 2018-07-14 – 2018-07-17 (×18): 500 mL

## 2018-07-14 MED ORDER — FENTANYL CITRATE (PF) 100 MCG/2ML IJ SOLN
50.0000 ug | Freq: Once | INTRAMUSCULAR | Status: AC
  Administered 2018-07-14: 50 ug via INTRAVENOUS

## 2018-07-14 NOTE — Procedures (Signed)
Intubation Procedure Note RAOUL CIANO [id number] 10-23-1960  Procedure: Intubation Indications: Respiratory insufficiency  Procedure Details Consent: Unable to obtain consent because of emergent medical necessity. Time Out: Verified patient identification, verified procedure, site/side was marked, verified correct patient position, special equipment/implants available, medications/allergies/relevent history reviewed, required imaging and test results available.  Performed  Maximum clean technique was used including gloves, hand hygiene and mask.  MAC and 4 Glidscope  Medications: - Fentanyl 174mg - Versed 250m- Etomidate 20104mAirway view: Grade 1   Evaluation Hemodynamic Status: BP stable throughout; O2 sats: stable throughout Patient's Current Condition: stable Complications: No apparent complications Patient did tolerate procedure well. Chest X-ray ordered to verify placement.  CXR: pending.   PauGeorgann HousekeeperGACNP-BC [facility] [phone] ([phone]/04/2018 5:14 AM

## 2018-07-14 NOTE — Progress Notes (Signed)
SLP Cancellation Note  Patient Details Name: GREGORI ABRIL MRN: [id number] DOB: March 27, 1961   Cancelled treatment:    Pt reintubated this am.       Juan Quam Laurice 07/14/2018, 10:05 AM

## 2018-07-14 NOTE — Progress Notes (Signed)
RT note: patient intubated at 0500.  Will hold on daily SBT this AM.  Patient currently tolerating ventilator well.  Will continue to monitor.

## 2018-07-14 NOTE — Progress Notes (Addendum)
Palliative Medicine consult noted. Per Dr Lake Bells request, we will reach out over the weekend with a plan for a family meeting, hopefully Monday.  Thank you for inviting [place] to see this patient.  Marjie Skiff Jenna Routzahn, RN, BSN, [facility] Palliative Medicine Team 07/14/2018 3:09 PM Office [phone]

## 2018-07-14 NOTE — Progress Notes (Signed)
PCCM INTERVAL PROGRESS NOTE  Called to bedside for patient with altered mental status and respiratory distress. This is a 57 year old male who was initially admitted for urgent dialysis due to lithium toxicity. Course has since been complicated by septic shock and metabolic encephalopathy requiring re-intubation. These had been treated and improving. He was able to be extubated 11/7, but it was understood he was at risk for requiring re-intubation and tracheostomy.   11/8 in the early AM hours he was felt to have suffered a decline in his mental status and was tachypniec. Upon my arrival he was unresponsive to verbal stimuli and had a respiratory rate of nearly 50 with very shallow paradoxical respirations. ABG from midnight (5 hours ago) shows only mild hypoxia, but regardless, I do not feel that this current respiratory pattern will support him for much longer, and suspect that he will tire from this sooner rather than later. He is also unable to manage his oral secretions.   Today's Vitals   07/14/18 0000 07/14/18 0200 07/14/18 0300 07/14/18 0400  BP: 114/63 130/66 135/70 135/67  Pulse: (!) 122 (!) 103 97 99  Resp: (!) 34 (!) 41 (!) 42 (!) 40  Temp: (!) 101.5 F (38.6 C)     TempSrc: Axillary     SpO2: 93% 95% 92% 91%  Weight:      Height:      PainSc:       Body mass index is 39.12 kg/m.   Plan: Unfortunately will have to re-intubate.  CXR and ABG VAP bundle Fentanyl infusion and PRN versed for RASS 0 to -1.    Georgann Housekeeper, AGACNP-BC [place] Pager [phone] or [phone]  07/14/2018 4:44 AM

## 2018-07-14 NOTE — Progress Notes (Signed)
NAME:  Frank Cox, MRN:  [id number], DOB:  Feb 17, 1961, LOS: 21 ADMISSION DATE:  06/30/2018, CONSULTATION DATE:  06/18/2018 REFERRING MD:  Stark Jock, ED [facility], CHIEF COMPLAINT:  Alerted mental status.  Brief History   57 year old man who requires urgent hemodialysis for acute on chronic lithium toxicity. Has been increasingly confused, experiencing multiple falls over the last week.  Li level 2.37 (upper limit 1.2) and nephrology wishes to dialyze the patient.   He was transferred out of ICU on 10/20.  On 10/24, he had agitation that did not respond to ativan or haldol; therefore, PCCM was called back for transfer to ICU.  Past Medical History  Bipolar affective disorder, T2DM on OHA, HTN, Prostate cancer, OSA on CPAP.  Significant Hospital Events   10/18 Admit with acute renal failure (cr 1.61) Foley catheter > 2 L immediately.  Underwent emergent dialysis for lithium toxicity 10/19 Lithium normalized.   10/20 Tx to SDU 10/24 Tx back to ICU due to agitation 10/26 PCCM called back for AMS, intermittent agitation 10/28 Intubated  11/4 CVVHD held 07/11/2018 increase FiO2 needs treated with Lasix twice during the night 07/13/2018 extubated 07/14/2018 reintubated  Consults: date of consult/date signed off & final recs:  Nephrology 10/18 - recommending HD Poison Control - recommending hydration, following serial Li levels. Psych 10/19 - do not restart lithium, needs psych consult once encephalopathy clears, recommending inpatient psych admission once medically cleared Urology 10/19 - input regarding possible obstructive uropathy, they recommend keeping foley in and follow up as outpatient 10/29 ID consult for fever 10/31 vascular surgery consult> R hand dusky with HIT  Procedures (surgical and bedside):  10/29 [facility]J CVC  11/1 [facility] HD Cath, started CRRT   Significant Diagnostic Tests:  CT head 10/15 > negative. Renal US 10/18 > diabetic glomerular nephropathy.  No obstruction.  Bilateral benign  appearing renal cyts.  CT Head 10/29 >> Neg for acute process, enlarge orbital vein? CT Abd Pelvis 10/29 >> Negative for infectious source, fluid in colon   EEG 10/29 >>> slowing, no seizure   MRI Brain 11/1 >>> unremarkable   Micro Data:  Urine 10/18 > negative Blood 10/20 > negative Blood 10/23 > negative  Urine 10/23 > negative Penile culture 10/22 >> staph lugdunensis (S-, entercoccus (vanco sens) UCx 10/28 >> NGTD  Blood 10/28 >> Coag neg staph 1/4   Trach Asp 10/28 >> NGTD    Antimicrobials:  Zosyn 10/20 > 10/21 Ceftriaxone 10/21 > 10/23 Unasyn 10/26 >>> off Oral Vanco 10/28, c diff neg, stopped   Cefepime 10/28 >> 10/31 Vanco 10/28 >> 10/31 Metronidazole 10/28, stopped Fluconazole 10/29 >> off  Subjective:  Failed extubation we will need tracheostomy over end-of-life discussion and near future  Objective   Blood pressure 118/61, pulse 92, temperature 99.1 F (37.3 C), temperature source Axillary, resp. rate 19, height 6\' 2"  (1.88 m), weight (!) 138 kg, SpO2 100 %.    Vent Mode: PCV FiO2 (%):  [40 %-100 %] 40 % Set Rate:  [16 bmp] 16 bmp PEEP:  [8 cmH20] 8 cmH20 Plateau Pressure:  [20 cmH20] 20 cmH20   Intake/Output Summary (Last 24 hours) at 07/14/2018 0827 Last data filed at 07/14/2018 0700 Gross per 24 hour  Intake 6466.53 ml  Output 9375 ml  Net -2908.47 ml   Filed Weights   07/12/18 0500 07/13/18 0439 07/14/18 0500  Weight: (!) 137.6 kg (!) 138.2 kg (!) 138 kg   Examination: General: Morbidly obese male who is heavily sedated  on ventilator HEENT: NG tube with tube feedings.  Tracheal tube to ventilator Neuro: Heavily sedated but follows some commands CV: s1s2 rrr, no m/r/g PULM: even/non-labored, lungs bilaterally bases.  Currently on pressure control ventilation for which she is tolerating quite well HY:IFOY, non-tender, bsx4 active  Extremities: Bilateral lower extremities with venous stasis changes, left hand with ischemic finger changes Skin:  no rashes or lesions    Assessment & Plan:   Septic Shock, unclear source, possible aspiration after being obtunded due to AMS, small right sided infiltrate, could represent atelectasis; prior cx with staph and enterococcus from penile discharge, one blood cx +methicillin resistant staph species(suspect contaminant)    No fever last 24 hours P: Resolved   Acute Hypoxemic and Hypercarbic Respiratory Failure, principally due to altered mental status and poor airway protection, also some component of interstitial infiltrates P: Failed extubation 07/13/2017 He will need tracheostomy for continue mechanical ventilatory support  Acute toxic, metabolic encephalopathy secondary to above  Agitated Delirium   - EEG with no seizure  -Metabolic disarray improved, lithium improved.  Suspect that delirium is now enlarged part due to his need for sedating medications P:  When extubated he would follow commands. Continue psychotropic drug     acute on Chronic Lithium Toxicity with Acute Toxic Encephalopathy  Suspected nephrogenic DI, hypernatremia P: Follow electrolytes and treat as needed  Bipolar Disorder  - patient denied suicidal intent.   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 Tresa Moore, h/o of radiation tx for prostate Ca -spoke with urology, ok for nursing to change foley routine  P: Continue Foley catheter And urology consult prior to removal of Foley   Macrocytic Anemia  Recent Labs    07/13/18 0454  07/14/18 0436  HGB 8.3* 8.7*    Monitor CBC   P: Following CBC   Thrombocytopenia, platelets are 185 on 07/14/2018 resolving Positive HIT Ab Probable HIT, SRA 101 P: Continue to monitoring CBC Transfuse per protocol Consider other forms of anticoagulation to be an oral route  DM Hyperglycemia  CBG (last 3)  Recent Labs    07/14/18 0532 07/14/18 0635 07/14/18 0741  GLUCAP 147* 178* 139*    P: Insulin Once hyponatremia is controlled we can stop D5W  Dusky left index and middle fingers, pulses intact Continue anticoagulation with argatroban Vascular surgery signed out   Disposition / Summary of Today's Plan 07/14/18   Extubated 07/13/2018 and required reintubation at 0 500 07/15/2015 He will need a tracheostomy from continue mechanical ventilatory support His serum sodium is rising despite interventions we will give him one-time dose of 500 mL of D5W, check a serum osmolarity. We may need to place his urine output cc for cc with D5 quarter saline. Due to ongoing discussions with salt concerning goals of care.     Diet: TF  Pain/Anxiety/Delirium protocol (if indicated): Yes  VAP protocol (if indicated): Yes  DVT prophylaxis: argatroban  GI prophylaxis: N/A. Hyperglycemia protocol: insulin gtt Mobility: Bedrest.  Once extubated will need physical therapy and Occupational Therapy consult  Code Status: full. Family Communication: 07/12/2018 father was called per physician and fully updated.  Labs (personally reviewed)  CBC: Recent Labs  Lab 07/10/18 0541 07/11/18 0218 07/12/18 0440 07/13/18 0454 07/14/18 0436  WBC 15.1* 15.7* 12.4* 12.4* 15.1*  HGB 8.9* 9.1* 8.0* 8.3* 8.7*  HCT 31.6* 32.3* 28.7* 30.2* 32.4*  MCV 104.6* 110.2* 107.9* 108.6* 108.0*  PLT 89* 124* 122* 135* 235    Basic Metabolic Panel: Recent Labs  Lab 07/10/18 0541 07/11/18 0218 07/11/18 1630 07/12/18 0440 07/13/18 0454 07/13/18 1615 07/14/18 0436  NA 145  146* 149* 146* 146*  146*  147* 152* 153*  K 3.3*  3.4* 3.3* 5.3* 3.0* 3.7  3.6 3.3* 3.2*  CL 113*  112* 116* 112* 113* 115*  116* 120* 124*  CO2 26  26 24 27 26 25  25 26 23   GLUCOSE 196*  196* 177* 150* 191* 156*  157* 146* 159*  BUN 19  19 33* 39* 46* 48*  49* 47* 45*  CREATININE 1.24  1.22 1.79* 1.97* 1.86* 1.61*  1.61* 1.55* 1.58*  CALCIUM 9.5  9.5 10.4* 10.2 10.4* 10.7*  10.7* 11.4* 11.5*  MG 2.7* 3.0*  --  2.7* 2.9*  --  3.1*  PHOS 2.4*  2.4* 4.1 4.6 5.1*  5.0* 4.8*  4.8* 2.3* 2.7   GFR: Estimated Creatinine Clearance: 75.3 mL/min (A) (by C-G formula based on SCr of 1.58 mg/dL (H)). Recent Labs  Lab 07/11/18 0218 07/12/18 0440 07/13/18 0454 07/14/18 0436  WBC 15.7* 12.4* 12.4* 15.1*    Liver Function Tests: Recent Labs  Lab 07/11/18 1630 07/12/18 0440 07/13/18 0454 07/13/18 1615 07/14/18 0436  ALBUMIN 2.1* 2.1* 2.0* 2.1* 2.2*   No results for input(s): LIPASE, AMYLASE in the last 168 hours. No results for input(s): AMMONIA in the last 168 hours.  ABG    Component Value Date/Time   PHART 7.334 (L) 07/14/2018 0605   PCO2ART 43.5 07/14/2018 0605   PO2ART 220.0 (H) 07/14/2018 0605   HCO3 23.1 07/14/2018 0605   TCO2 24 07/14/2018 0605   ACIDBASEDEF 3.0 (H) 07/14/2018 0605   O2SAT 100.0 07/14/2018 0605     Coagulation Profile: No results for input(s): INR, PROTIME in the last 168 hours.  Cardiac Enzymes: No results for input(s): CKTOTAL, CKMB, CKMBINDEX, TROPONINI in the last 168 hours.    CBG: Recent Labs  Lab 07/14/18 0259 07/14/18 0428 07/14/18 0532 07/14/18 0635 07/14/18 0741  GLUCAP 151* 154* 147* 178* 139*    App cct 40 min  Richardson Landry Minor ACNP Maryanna Shape PCCM Pager [phone] till 1 pm If no answer page [phone] 07/14/2018, 8:27 AM

## 2018-07-14 NOTE — Progress Notes (Signed)
[facility] Progress Note    Assessment/ Plan:   57yo male with PMH of bipolar disorder on lithium, T2DM, HTN, prostate cancer, OSA on CPAP presented with AMS and frequent falls due to acute on chronic lithium toxicity, now s/p HD x1 10/18 with resolution of lithium toxicity. Continued to have agitation, fever, started on IV abx with ID in consult and initiated CRRT 11/1-11/4 for continued metabolic abnormalities and possible uremia with improvement in metabolic abnormalities, however continued to have encephalopathy.  1. AKI due to acute on chronic lithium toxicity - Lithium toxicity now resolved. S/p CRRT (11/1-11/4), Cr continues to improve, 1.58 this am, GFR 47. UOP 2.6 ml/kg/hr with net -2.6L last 24 hours. Weight stable today. AKI improving, however overall encephalopathy without much progress. Will increase D5W rate and free water to match outs. Will continue to maintain access. 2. Hypernatremia - likely due to nephrogenic DI given chronic lithium use. No response to previous high dose desmopressin x2, indicating partial DI with previous inability to initiate thiazides d/t AKI.  Na increased this am to 153. Will increase D5W and free water flushes, continue on insulin gtt, and increase amiloride dose. 3. HTN/volume status - low normal BP, off pressors. Pitting edema improving, 1+ to all extremities.  4. Electrolytes - K 3.2 Na 153 Co2 23 Cl 124 Phos 2.7 Calcium 11.5. Repleting as necessary.  5. Macrocytic Anemia - Hb 8.7, stable. Macrocytosis developed during admission, unclear etiology. Drop during admission likely due to initiation of CRRT and clotting x3 despite argatroban for presumed HIT. Plts stable 185. 6. AMS - had improved yesterday with increase in Celexa per primary and able to extubate yesterday, however did require re-intubation overnight due to respiratory distress and worsened mental status. Sedated at time of exam this am. 7. ID - sepsis resolved.  8. H/o BOO -  urinary catheter placed, continues on flomax and proscar. Urology consult placed.  Subjective:   Sedated on vent.   Objective:   BP (!) 152/93   Pulse (!) 117   Temp 99.1 F (37.3 C) (Axillary)   Resp 12   Ht 6\' 2"  (1.88 m)   Wt (!) 138 kg   SpO2 100%   BMI 39.06 kg/m   Intake/Output Summary (Last 24 hours) at 07/14/2018 0714 Last data filed at 07/14/2018 0601 Gross per 24 hour  Intake 6722.6 ml  Output 9375 ml  Net -2652.4 ml   Weight change: -0.2 kg  Physical Exam:  Gen: obese male, on vent, sedated CVS: RRR Resp: CTAB, on vent Abd: soft, +BS Ext: Pitting edema improved, 1+ to all extremities.  Imaging: Dg Abd 1 View  Result Date: 07/13/2018 CLINICAL DATA:  Nasogastric tube placement. EXAM: ABDOMEN - 1 VIEW COMPARISON:  Radiograph of July 03, 2018. FINDINGS: The bowel gas pattern is normal. Distal tip of nasogastric tube is seen in expected position of gastroesophageal junction. IMPRESSION: Distal tip of nasogastric tube seen in expected position of gastroesophageal junction; advancement is recommended. Electronically Signed   By: Marijo Conception, M.D.   On: 07/13/2018 10:40   Dg Chest Port 1 View  Result Date: 07/14/2018 CLINICAL DATA:  Intubation EXAM: PORTABLE CHEST 1 VIEW COMPARISON:  07/13/2018 FINDINGS: Endotracheal tube tip measures 4.9 cm above the carina. Enteric tube tip is off the field of view but below the left hemidiaphragm. Right central venous catheter with tip over the cavoatrial junction region. Left central venous catheter with tip over the expected location of the brachiocephalic artery. This is  been pulled back since the previous study. Shallow inspiration with atelectasis in the lung bases. Probable small left pleural effusion. No airspace disease or consolidation. No pneumothorax. IMPRESSION: Appliances appear in satisfactory location. Shallow inspiration with atelectasis in the lung bases. Probable small left pleural effusion. Electronically Signed    By: Lucienne Capers M.D.   On: 07/14/2018 05:58   Dg Chest Port 1 View  Result Date: 07/13/2018 CLINICAL DATA:  Respiratory failure and history of fever EXAM: PORTABLE CHEST 1 VIEW COMPARISON:  Yesterday FINDINGS: Endotracheal tube tip at the clavicular heads. Bilateral IJ line with tips at the SVC. An orogastric tube at least reaches the stomach. Low lung volumes with base and perihilar opacity greater on the left. No Kerley lines, effusion, or pneumothorax. Cardiomegaly. IMPRESSION: 1. Stable hardware positioning. 2. Stable atelectasis or pneumonia on both sides Electronically Signed   By: Monte Fantasia M.D.   On: 07/13/2018 09:35   Labs: BMET Recent Labs  Lab 07/10/18 0541 07/11/18 0218 07/11/18 1630 07/12/18 0440 07/13/18 0454 07/13/18 1615 07/14/18 0436  NA 145  146* 149* 146* 146* 146*  147* 152* 153*  K 3.3*  3.4* 3.3* 5.3* 3.0* 3.7  3.6 3.3* 3.2*  CL 113*  112* 116* 112* 113* 115*  116* 120* 124*  CO2 26  26 24 27 26 25  25 26 23   GLUCOSE 196*  196* 177* 150* 191* 156*  157* 146* 159*  BUN 19  19 33* 39* 46* 48*  49* 47* 45*  CREATININE 1.24  1.22 1.79* 1.97* 1.86* 1.61*  1.61* 1.55* 1.58*  CALCIUM 9.5  9.5 10.4* 10.2 10.4* 10.7*  10.7* 11.4* 11.5*  PHOS 2.4*  2.4* 4.1 4.6 5.1*  5.0* 4.8*  4.8* 2.3* 2.7   CBC Recent Labs  Lab 07/11/18 0218 07/12/18 0440 07/13/18 0454 07/14/18 0436  WBC 15.7* 12.4* 12.4* 15.1*  HGB 9.1* 8.0* 8.3* 8.7*  HCT 32.3* 28.7* 30.2* 32.4*  MCV 110.2* 107.9* 108.6* 108.0*  PLT 124* 122* 135* 185    Medications:    . aMILoride  5 mg Oral Daily  . aspirin  81 mg Per Tube Daily  . chlorhexidine  15 mL Mouth Rinse BID  . Chlorhexidine Gluconate Cloth  6 each Topical q morning - 10a  . citalopram  40 mg Per Tube Daily  . clonazepam  1 mg Per Tube BID  . feeding supplement (PRO-STAT SUGAR FREE 64)  60 mL Per Tube QID  . finasteride  5 mg Oral Daily  . folic acid  1 mg Per Tube Daily  . free water  400 mL Per Tube Q4H   . lamoTRIgine  200 mg Per Tube QHS  . mouth rinse  15 mL Mouth Rinse q12n4p  . methadone  10 mg Oral Q12H  . multivitamin  15 mL Per Tube Daily  . nystatin   Topical BID  . pantoprazole sodium  40 mg Per Tube Daily  . QUEtiapine  100 mg Per Tube BID  . tamsulosin  0.8 mg Oral Daily  . thiamine  100 mg Per Tube Daily    Rory Percy, DO PGY2 07/14/2018, 7:14 AM

## 2018-07-14 NOTE — Progress Notes (Signed)
ANTICOAGULATION CONSULT NOTE   Pharmacy Consult for argatroban Indication: Cool hand / HIT  Patient Measurements: Height: 6\' 2"  (188 cm) Weight: (!) 304 lb 3.8 oz (138 kg) IBW/kg (Calculated) : 82.2  Vital Signs: Temp: 98.2 F (36.8 C) (11/08 0832) Temp Source: Axillary (11/08 0832) BP: 124/77 (11/08 0900) Pulse Rate: 89 (11/08 0900)  Labs: Recent Labs    07/12/18 0440 07/13/18 0454 07/13/18 1615 07/14/18 0436  HGB 8.0* 8.3*  --  8.7*  HCT 28.7* 30.2*  --  32.4*  PLT 122* 135*  --  185  APTT 60* 61*  --  52*  CREATININE 1.86* 1.61*  1.61* 1.55* 1.58*   Assessment: 32 yoM initially on heparin SQ for VTE prophylaxis with platelet drop to 41. 4T score at least 5. HIT antibody positive, OD 2.7. SRA is positive. Patient with dusky/cool hand per MD assessment but no intervention by vascular at this point. Pharmacy consulted to start IV argatroban for r/o clot  Currently on IV argatroban at 0.86 mcg/kg/min. H/H low stable, Plt stable. APTT stable at 52. No overt s/s of bleeding noted.   Goal of Therapy:  aPTT 50-90 seconds Monitor platelets by anticoagulation protocol: Yes   Plan:  Continue argatroban at 0.51mcg/kg/min (4.62mL/hr) Daily aPTT/CBC Monitor for s/s of bleeding   Alanda Slim, PharmD, [facility] Clinical Pharmacist Please see AMION for all Pharmacists' Contact Phone Numbers 07/14/2018, 10:16 AM

## 2018-07-14 NOTE — Progress Notes (Signed)
RT NTS pt per verbal order, pt had minimal secretions, thick tan/pink tinge. Pt had poor cough. Will continue to monitor.

## 2018-07-14 NOTE — Progress Notes (Signed)
eLink Physician-Brief Progress Note Patient Name: Frank Cox DOB: Sep 03, 1961 MRN: [id number]   Date of Service  07/14/2018  HPI/Events of Note  Patient extubated yesterday morning. Seen today tachypneic with secretions and poor cough. Seen laying flat. BMI 39  eICU Interventions  Discussed with bedside. Plan is to optimize ie reposition, nasal airway, suction secretions via nasal airway.  Hopefully to avoid re-intubation and possibly tracheostomy.     Intervention Category Major Interventions: Respiratory failure - evaluation and management  Judd Lien 07/14/2018, 12:33 AM

## 2018-07-14 NOTE — Progress Notes (Signed)
RT note: per MD, wants patient to wean as tolerated.  Patient placed on CPAP/PSV of 12/8 at 1105 and is currently tolerating well.  Will continue to monitor.

## 2018-07-15 ENCOUNTER — Inpatient Hospital Stay (HOSPITAL_COMMUNITY): Payer: 59

## 2018-07-15 DIAGNOSIS — Z515 Encounter for palliative care: Secondary | ICD-10-CM

## 2018-07-15 DIAGNOSIS — Z7189 Other specified counseling: Secondary | ICD-10-CM

## 2018-07-15 LAB — CBC WITH DIFFERENTIAL/PLATELET
BASOS ABS: 0 10*3/uL (ref 0.0–0.1)
Band Neutrophils: 4 %
Basophils Relative: 0 %
Eosinophils Absolute: 0.2 10*3/uL (ref 0.0–0.5)
Eosinophils Relative: 2 %
HCT: 30.1 % — ABNORMAL LOW (ref 39.0–52.0)
HEMOGLOBIN: 8.1 g/dL — AB (ref 13.0–17.0)
LYMPHS PCT: 8 %
Lymphs Abs: 1 10*3/uL (ref 0.7–4.0)
MCH: 29.7 pg (ref 26.0–34.0)
MCHC: 26.9 g/dL — ABNORMAL LOW (ref 30.0–36.0)
MCV: 110.3 fL — ABNORMAL HIGH (ref 80.0–100.0)
METAMYELOCYTES PCT: 1 %
MONO ABS: 0.1 10*3/uL (ref 0.1–1.0)
Monocytes Relative: 1 %
NEUTROS PCT: 84 %
NRBC: 0.2 % (ref 0.0–0.2)
Neutro Abs: 10.8 10*3/uL — ABNORMAL HIGH (ref 1.7–7.7)
PLATELETS: 156 10*3/uL (ref 150–400)
RBC: 2.73 MIL/uL — ABNORMAL LOW (ref 4.22–5.81)
RDW: 15.7 % — ABNORMAL HIGH (ref 11.5–15.5)
WBC: 12.1 10*3/uL — AB (ref 4.0–10.5)
nRBC: 1 /100 WBC — ABNORMAL HIGH

## 2018-07-15 LAB — GLUCOSE, CAPILLARY
GLUCOSE-CAPILLARY: 171 mg/dL — AB (ref 70–99)
GLUCOSE-CAPILLARY: 165 mg/dL — AB (ref 70–99)
GLUCOSE-CAPILLARY: 155 mg/dL — AB (ref 70–99)
GLUCOSE-CAPILLARY: 132 mg/dL — AB (ref 70–99)
GLUCOSE-CAPILLARY: 167 mg/dL — AB (ref 70–99)
GLUCOSE-CAPILLARY: 169 mg/dL — AB (ref 70–99)
GLUCOSE-CAPILLARY: 141 mg/dL — AB (ref 70–99)
GLUCOSE-CAPILLARY: 172 mg/dL — AB (ref 70–99)
Glucose-Capillary: 162 mg/dL — ABNORMAL HIGH (ref 70–99)
Glucose-Capillary: 147 mg/dL — ABNORMAL HIGH (ref 70–99)
Glucose-Capillary: 159 mg/dL — ABNORMAL HIGH (ref 70–99)
Glucose-Capillary: 152 mg/dL — ABNORMAL HIGH (ref 70–99)
Glucose-Capillary: 142 mg/dL — ABNORMAL HIGH (ref 70–99)
Glucose-Capillary: 155 mg/dL — ABNORMAL HIGH (ref 70–99)
Glucose-Capillary: 153 mg/dL — ABNORMAL HIGH (ref 70–99)
Glucose-Capillary: 137 mg/dL — ABNORMAL HIGH (ref 70–99)
Glucose-Capillary: 121 mg/dL — ABNORMAL HIGH (ref 70–99)
Glucose-Capillary: 149 mg/dL — ABNORMAL HIGH (ref 70–99)
Glucose-Capillary: 164 mg/dL — ABNORMAL HIGH (ref 70–99)

## 2018-07-15 LAB — RENAL FUNCTION PANEL
ANION GAP: 4 — AB (ref 5–15)
ANION GAP: 5 (ref 5–15)
Albumin: 2 g/dL — ABNORMAL LOW (ref 3.5–5.0)
Albumin: 2 g/dL — ABNORMAL LOW (ref 3.5–5.0)
BUN: 57 mg/dL — AB (ref 6–20)
BUN: 59 mg/dL — ABNORMAL HIGH (ref 6–20)
CALCIUM: 10.9 mg/dL — AB (ref 8.9–10.3)
CHLORIDE: 122 mmol/L — AB (ref 98–111)
CO2: 23 mmol/L (ref 22–32)
CO2: 22 mmol/L (ref 22–32)
Calcium: 11.4 mg/dL — ABNORMAL HIGH (ref 8.9–10.3)
Chloride: 115 mmol/L — ABNORMAL HIGH (ref 98–111)
Creatinine, Ser: 1.66 mg/dL — ABNORMAL HIGH (ref 0.61–1.24)
Creatinine, Ser: 1.56 mg/dL — ABNORMAL HIGH (ref 0.61–1.24)
GFR calc non Af Amer: 47 mL/min — ABNORMAL LOW (ref >60)
GFR, EST AFRICAN AMERICAN: 55 mL/min — AB (ref >60)
Glucose, Bld: 162 mg/dL — ABNORMAL HIGH (ref 70–99)
Glucose, Bld: 159 mg/dL — ABNORMAL HIGH (ref 70–99)
POTASSIUM: 4.1 mmol/L (ref 3.5–5.1)
Phosphorus: 4.6 mg/dL (ref 2.5–4.6)
Phosphorus: 4.6 mg/dL (ref 2.5–4.6)
Potassium: 4.2 mmol/L (ref 3.5–5.1)
SODIUM: 142 mmol/L (ref 135–145)
Sodium: 149 mmol/L — ABNORMAL HIGH (ref 135–145)

## 2018-07-15 LAB — APTT: aPTT: 55 seconds — ABNORMAL HIGH (ref 24–36)

## 2018-07-15 LAB — MAGNESIUM: MAGNESIUM: 2.8 mg/dL — AB (ref 1.7–2.4)

## 2018-07-15 LAB — TRIGLYCERIDES: Triglycerides: 214 mg/dL — ABNORMAL HIGH (ref <150)

## 2018-07-15 MED ORDER — INSULIN ASPART 100 UNIT/ML ~~LOC~~ SOLN: 3.0000 [IU] | SUBCUTANEOUS | Status: DC

## 2018-07-15 MED ORDER — INSULIN ASPART 100 UNIT/ML ~~LOC~~ SOLN
7.0000 [IU] | SUBCUTANEOUS | Status: DC
  Administered 2018-07-15 – 2018-07-16 (×3): 7 [IU] via SUBCUTANEOUS

## 2018-07-15 MED ORDER — INSULIN DETEMIR 100 UNIT/ML ~~LOC~~ SOLN
10.0000 [IU] | Freq: Two times a day (BID) | SUBCUTANEOUS | Status: DC
  Filled 2018-07-15: qty 0.1

## 2018-07-15 MED ORDER — INSULIN DETEMIR 100 UNIT/ML ~~LOC~~ SOLN
20.0000 [IU] | Freq: Two times a day (BID) | SUBCUTANEOUS | Status: DC
  Administered 2018-07-15: 10 [IU] via SUBCUTANEOUS
  Filled 2018-07-15 (×2): qty 0.2

## 2018-07-15 MED ORDER — DEXTROSE 10 % IV SOLN: INTRAVENOUS | Status: DC | PRN

## 2018-07-15 MED ORDER — INSULIN ASPART 100 UNIT/ML ~~LOC~~ SOLN
3.0000 [IU] | SUBCUTANEOUS | Status: DC
  Administered 2018-07-15: 6 [IU] via SUBCUTANEOUS
  Administered 2018-07-16: 3 [IU] via SUBCUTANEOUS
  Administered 2018-07-16: 6 [IU] via SUBCUTANEOUS

## 2018-07-15 MED ORDER — INSULIN DETEMIR 100 UNIT/ML ~~LOC~~ SOLN
10.0000 [IU] | Freq: Two times a day (BID) | SUBCUTANEOUS | Status: DC
  Administered 2018-07-15: 10 [IU] via SUBCUTANEOUS
  Filled 2018-07-15: qty 0.1

## 2018-07-15 NOTE — Progress Notes (Signed)
ANTICOAGULATION CONSULT NOTE   Pharmacy Consult for argatroban Indication: Cool hand / HIT  Patient Measurements: Height: 6\' 2"  (188 cm) Weight: (!) 304 lb 3.8 oz (138 kg) IBW/kg (Calculated) : 82.2  Vital Signs: Temp: 98.9 F (37.2 C) (11/09 1200) Temp Source: Axillary (11/09 1200) BP: 99/59 (11/09 1122) Pulse Rate: 85 (11/09 1122)  Labs: Recent Labs    07/13/18 0454  07/14/18 0436 07/14/18 1352 07/15/18 0414  HGB 8.3*  --  8.7*  --  8.1*  HCT 30.2*  --  32.4*  --  30.1*  PLT 135*  --  185  --  156  APTT 61*  --  52*  --  55*  CREATININE 1.61*  1.61*   < > 1.58* 1.61* 1.66*   < > = values in this interval not displayed.   Assessment: 23 yoM initially on heparin SQ for VTE prophylaxis with platelet drop to 41. 4T score at least 5. HIT antibody positive, OD 2.7. SRA is positive. Patient with dusky/cool hand per MD assessment but no intervention by vascular at this point. Pharmacy consulted to start IV argatroban for r/o clot  Currently on IV argatroban at 0.86 mcg/kg/min. H/H low stable, Plt improving. APTT stable at 55. No overt s/s of bleeding noted.   Goal of Therapy:  aPTT 50-90 seconds Monitor platelets by anticoagulation protocol: Yes   Plan:  Continue argatroban at 0.37mcg/kg/min (4.63mL/hr) Daily aPTT/CBC Monitor for s/s of bleeding   Salome Arnt, PharmD, BCPS Please see AMION for all pharmacy numbers 07/15/2018 1:17 PM

## 2018-07-15 NOTE — Progress Notes (Signed)
Pt NGT moved significantly out of place during turn.  Tube feed immediately stopped. CCM made aware. NGT advanced and secured in place. TF will continue to be held at this time. Chest and abdominal XR pending. RN will continue to monitor pt closely.

## 2018-07-15 NOTE — Consult Note (Signed)
Consultation Note Date: 07/15/2018   Patient Name: Frank Cox  DOB: Jun 12, 1961  MRN: [id number]  Age / Sex: 57 y.o., male  PCP: Frank Contras, MD Referring Physician: Juanito Doom, MD  Reason for Consultation: Establishing goals of care  HPI/Patient Profile: 57 y.o. male  with past medical history of bipoar disorder, DM2, HTN, OSA admitted on 06/10/2018 with altered mental status and lithium toxicity requiring CRRT and intubation. He was intubated on 10/28 for mental status, extubated on 11/7, but required reintubation on 11/8. Palliative medicine consulted for Frank Cox.   Clinical Assessment and Goals of Care: Evaluated patient. In bed on full vent support. Eyes open but does not track or respond verbally.  Noted per chart family can come in on Monday 11/11 for full GOC discussion.  Called patient's father- Frank Cox to set time- spent extended amount of time in discussion with Frank Cox, giving emotional support.  Per Frank Cox- patient has had very difficult life. Prior to this admission he has had ongoing physical and mental decompensation that has greatly affected his quality of life. Patient used to work and be independent. Frank Cox has watched him decline, lose multiple jobs, has seen his health decline.  Frank Cox states he has had discussions with Frank Cox regarding his EOL wishes. Frank Cox always stated he never wanted to be a burden, or dependent on anyone for care. He would not want to live in a vegetative state, would not want to be "shipped off somewhere", left to live completely disabled. Frank Cox has discussed this with Frank Cox's mother and she is in agreement.  Frank Cox notes that Frank Cox is his second son that he expects to lose. Frank Cox is most interested in ensuring Frank Cox is comfortable and not suffering.  We made plan to meet in person on Monday 11/12 at 10am.   Primary Decision Maker NEXT OF KIN- patient's father- Frank Cox    SUMMARY  OF RECOMMENDATIONS -Plan for in person discussion of comfort measures on Monday at [facility] with patient's father -No tracheostomy  Code Status/Advance Care Planning:  DNR  Additional Recommendations (Limitations, Scope, Preferences):  No Tracheostomy  Prognosis:    Unable to determine  Discharge Planning: To Be Determined  Primary Diagnoses: Present on Admission: . Lithium toxicity . Bipolar 1 disorder ([place]) . Benign essential HTN . Morbid obesity ([place]) . OSA (obstructive sleep apnea) . Malignant neoplasm of prostate ([place])   I have reviewed the medical record, interviewed the patient and family, and examined the patient. The following aspects are pertinent.  Past Medical History:  Diagnosis Date  . Anxiety   . Arthritis   . Benign essential HTN 10/01/2016  . Bipolar 1 disorder ([place])    "well controlled on Lithium"  . Bruises easily   . Contact lens/glasses fitting   . Depression   . Diabetes mellitus   . Fatigue   . Gallstones   . Generalized headaches   . Hearing loss   . Hyperlipidemia   . Hypertension    boarderline - elevated  . Kidney stone   .  Obesity   . OSA on CPAP   . Poor circulation   . Prostate cancer ([place]) 02/14/14   Gleason 4+3=7, volume 56 mL  . S/P radiation therapy 07/03/2014 through 08/28/2014                                                       Prostate 7800 cGy in 40 sessions, seminal vesicles 5600 cGy in 40 sessions                         Social History   Socioeconomic History  . Marital status: Single    Spouse name: Not on file  . Number of children: Not on file  . Years of education: Not on file  . Highest education level: Not on file  Occupational History  . Not on file  Social Needs  . Financial resource strain: Not on file  . Food insecurity:    Worry: Not on file    Inability: Not on file  . Transportation needs:    Medical: Not on file    Non-medical: Not on file  Tobacco Use  . Smoking status: Never Smoker  .  Smokeless tobacco: Never Used  Substance and Sexual Activity  . Alcohol use: No  . Drug use: No  . Sexual activity: Not on file  Lifestyle  . Physical activity:    Days per week: Not on file    Minutes per session: Not on file  . Stress: Not on file  Relationships  . Social connections:    Talks on phone: Not on file    Gets together: Not on file    Attends religious service: Not on file    Active member of club or organization: Not on file    Attends meetings of clubs or organizations: Not on file    Relationship status: Not on file  Other Topics Concern  . Not on file  Social History Narrative  . Not on file   Family History  Problem Relation Age of Onset  . Arthritis Mother   . Alcohol abuse Mother   . Arthritis Father    Scheduled Meds: . aMILoride  10 mg Oral Daily  . aspirin  81 mg Per Tube Daily  . chlorhexidine gluconate (MEDLINE KIT)  15 mL Mouth Rinse BID  . Chlorhexidine Gluconate Cloth  6 each Topical Q0600  . citalopram  40 mg Per Tube Daily  . clonazepam  1 mg Per Tube BID  . feeding supplement (PRO-STAT SUGAR FREE 64)  60 mL Per Tube QID  . finasteride  5 mg Oral Daily  . folic acid  1 mg Per Tube Daily  . free water  500 mL Per Tube Q4H  . lamoTRIgine  200 mg Per Tube QHS  . mouth rinse  15 mL Mouth Rinse 10 times per day  . methadone  10 mg Oral Q12H  . multivitamin  15 mL Per Tube Daily  . nystatin   Topical BID  . pantoprazole sodium  40 mg Per Tube Daily  . QUEtiapine  100 mg Per Tube BID  . tamsulosin  0.8 mg Oral Daily  . thiamine  100 mg Per Tube Daily   Continuous Infusions: . sodium chloride 500 mL (07/09/18 1717)  . argatroban 0.86 mcg/kg/min (07/15/18  1600)  . dextrose 150 mL/hr at 07/15/18 1600  . feeding supplement (VITAL HIGH PROTEIN) 30 mL/hr at 07/15/18 1600  . fentaNYL infusion INTRAVENOUS 225 mcg/hr (07/15/18 1600)  . insulin 2 mL/hr at 07/15/18 1600  . sodium chloride     PRN Meds:.sodium chloride, acetaminophen, fentaNYL,  hydrALAZINE, midazolam, midazolam, sodium chloride Medications Prior to Admission:  Prior to Admission medications   Medication Sig Start Date End Date Taking? Authorizing Provider  citalopram (CELEXA) 40 MG tablet Take 40 mg by mouth daily.     Yes [provider]  clonazePAM (KLONOPIN) 1 MG tablet Take 1 mg by mouth 3 (three) times daily.    Yes [provider]  finasteride (PROSCAR) 5 MG tablet Take 5 mg by mouth daily.   Yes [provider]  lamoTRIgine (LAMICTAL) 200 MG tablet Take 400 mg by mouth at bedtime.    Yes [provider]  lithium carbonate (ESKALITH) 450 MG CR tablet Take 1,800 mg by mouth at bedtime.    Yes [provider]  Melatonin 5 MG TABS Take 5 mg by mouth at bedtime as needed (sleep).   Yes [provider]  QUEtiapine (SEROQUEL) 100 MG tablet Take 200 mg by mouth at bedtime.  06/10/14  Yes [provider]  BAYER MICROLET LANCETS lancets Use as instructed to check blood sugar 2 times per day dx code E11.9 10/23/15   Elayne Snare, MD  CONTOUR NEXT TEST test strip TEST BLOOD SUGAR TWICE DAILY AS DIRECTED 08/21/17   Elayne Snare, MD   Allergies  Allergen Reactions  . Heparin     HIT Ab +; SRA + (2019)   Review of Systems  Unable to perform ROS: Intubated    Physical Exam  Constitutional: He appears well-developed and well-nourished.  Vitals reviewed.   Vital Signs: BP (!) 106/57   Pulse 74   Temp 98.9 F (37.2 C) (Axillary)   Resp 16   Ht _0  (1.88 m)   Wt (!) 138 kg   SpO2 100%   BMI 39.06 kg/m  Pain Scale: CPOT   Pain Score: Asleep   SpO2: SpO2: 100 % O2 Device:SpO2: 100 % O2 Flow Rate: .O2 Flow Rate (L/min): 6 L/min Intubated on Vent support  IO: Intake/output summary:   Intake/Output Summary (Last 24 hours) at 07/15/2018 1609 Last data filed at 07/15/2018 1600 Gross per 24 hour  Intake 5028.53 ml  Output 3875 ml  Net 1153.53 ml    LBM: Last BM Date: 07/15/18 Baseline Weight:  Weight: (!) 139.6 kg Most recent weight: Weight: (!) 138 kg     Palliative Assessment/Data: PPS: 10%     Thank you for this consult. Palliative medicine will continue to follow and assist as needed.   Time In: 1545 Time Out: 1600 Time Total: 75 minutes Greater than 50%  of this time was spent counseling and coordinating care related to the above assessment and plan.  Signed by: Mariana Kaufman, AGNP-C Palliative Medicine    Please contact Palliative Medicine Team phone at [phone] for questions and concerns.  For individual provider: See Shea Evans

## 2018-07-15 NOTE — Progress Notes (Signed)
NAME:  Frank Cox, MRN:  [id number], DOB:  1961-04-01, LOS: 53 ADMISSION DATE:  07/01/2018, CONSULTATION DATE:  06/22/2018 REFERRING MD:  Stark Jock, ED [facility], CHIEF COMPLAINT:  Alerted mental status.  Brief History   57 year old man who requires urgent hemodialysis for acute on chronic lithium toxicity. Has been increasingly confused, experiencing multiple falls over the last week.  Li level 2.37 (upper limit 1.2) and nephrology wishes to dialyze the patient.   He was transferred out of ICU on 10/20.  On 10/24, he had agitation that did not respond to ativan or haldol; therefore, PCCM was called back for transfer to ICU.  Past Medical History  Bipolar affective disorder, T2DM on OHA, HTN, Prostate cancer, OSA on CPAP.  Significant Hospital Events   10/18 Admit with acute renal failure (cr 1.61) Foley catheter > 2 L immediately.  Underwent emergent dialysis for lithium toxicity 10/19 Lithium normalized.   10/20 Tx to SDU 10/24 Tx back to ICU due to agitation 10/26 PCCM called back for AMS, intermittent agitation 10/28 Intubated  11/4 CVVHD held 07/11/2018 increase FiO2 needs treated with Lasix twice during the night 07/13/2018 extubated 07/14/2018 reintubated  Consults: date of consult/date signed off & final recs:  Nephrology 10/18 - recommending HD Poison Control - recommending hydration, following serial Li levels. Psych 10/19 - do not restart lithium, needs psych consult once encephalopathy clears, recommending inpatient psych admission once medically cleared Urology 10/19 - input regarding possible obstructive uropathy, they recommend keeping foley in and follow up as outpatient 10/29 ID consult for fever 10/31 vascular surgery consult> R hand dusky with HIT  Procedures (surgical and bedside):  10/29 LIJ CVC  11/1 RIJ HD Cath, started CRRT   Significant Diagnostic Tests:  CT head 10/15 > negative. Renal US 10/18 > diabetic glomerular nephropathy.  No obstruction.  Bilateral benign  appearing renal cyts.  CT Head 10/29 >> Neg for acute process, enlarge orbital vein? CT Abd Pelvis 10/29 >> Negative for infectious source, fluid in colon   EEG 10/29 >>> slowing, no seizure   MRI Brain 11/1 >>> unremarkable   Micro Data:  Urine 10/18 > negative Blood 10/20 > negative Blood 10/23 > negative  Urine 10/23 > negative Penile culture 10/22 >> staph lugdunensis (S-, entercoccus (vanco sens) UCx 10/28 >> NGTD  Blood 10/28 >> Coag neg staph 1/4   Trach Asp 10/28 >> NGTD    Antimicrobials:  Zosyn 10/20 > 10/21 Ceftriaxone 10/21 > 10/23 Unasyn 10/26 >>> off Oral Vanco 10/28, c diff neg, stopped   Cefepime 10/28 >> 10/31 Vanco 10/28 >> 10/31 Metronidazole 10/28, stopped Fluconazole 10/29 >> off  Subjective:  Continue current treatment.  Plans for one-way extubation on Monday.  Objective   Blood pressure 111/64, pulse 80, temperature 99 F (37.2 C), temperature source Axillary, resp. rate 16, height 6\' 2"  (1.88 m), weight (!) 138 kg, SpO2 100 %.    Vent Mode: PCV FiO2 (%):  [40 %] 40 % Set Rate:  [16 bmp] 16 bmp PEEP:  [8 cmH20] 8 cmH20 Pressure Support:  [12 cmH20] 12 cmH20 Plateau Pressure:  [19 cmH20-21 cmH20] 19 cmH20   Intake/Output Summary (Last 24 hours) at 07/15/2018 0927 Last data filed at 07/14/2018 2200 Gross per 24 hour  Intake 1811.63 ml  Output 3475 ml  Net -1663.37 ml   Filed Weights   07/13/18 0439 07/14/18 0500 07/15/18 0500  Weight: (!) 138.2 kg (!) 138 kg (!) 138 kg   Examination: General: Obese male  who is heavily sedated on mechanical ventilatory support HEENT: G-tube with tube feedings, endotracheal tube to ventilator Neuro: Opens eyes but does not follow commands CV: Sounds are regular PULM: even/non-labored, lungs bilaterally decreased IF:OYDX, non-tender, bsx4 active  Extremities: Lateral lower extremity venous stasis changes noted, left hand with ischemic fingers noted no significant change 2+ lower extremity edema Skin:  Multiple areas of ecchymosis     Assessment & Plan:   Septic Shock, unclear source, possible aspiration after being obtunded due to AMS, small right sided infiltrate, could represent atelectasis; prior cx with staph and enterococcus from penile discharge, one blood cx +methicillin resistant staph species(suspect contaminant)    No fever last 24 hours P: Resolved   Acute Hypoxemic and Hypercarbic Respiratory Failure, principally due to altered mental status and poor airway protection, also some component of interstitial infiltrates P: Is failed extubation 07/13/2018 Per discussion with father may be one-way extubation on 07/17/2018  Acute toxic, metabolic encephalopathy secondary to above  Agitated Delirium   - EEG with no seizure  -Metabolic disarray improved, lithium improved.  Suspect that delirium is now enlarged part due to his need for sedating medications P:  Requires heavy duty sedation due to underlying bipolar disease     acute on Chronic Lithium Toxicity with Acute Toxic Encephalopathy  Suspected nephrogenic DI, hypernatremia P: Electrolytes and treat as needed  Bipolar Disorder  - patient denied suicidal intent.   - S/p psych eval 10/19 P:  Continue psychotropic medications  Probable OSA / OHS P: Currently on [place] as he is not extubated at this time and is failed extubation on 07/13/2018  Acute on Chronic Renal Failure, Stage 2 CKD Nephrogenic DI from lithium Hypertnatremia Hyperchloremia Hypokalemia   P: He is off CVVH Continue D5W Continue free water  History bladder outlet obstruction  -s/p foley, patient of Dr. Tresa Moore, h/o of radiation tx for prostate Ca -spoke with urology, ok for nursing to change foley routine  P: Continue Foley   Macrocytic Anemia  Recent Labs    07/14/18 0436 07/15/18 0414  HGB 8.7* 8.1*    Monitor CBC   P: Following CBC   Thrombocytopenia, platelets are 185 on 07/14/2018 resolving Positive HIT  Ab Probable HIT, SRA 101 P: Monitor Cbc Infused per protocol  DM Hyperglycemia  CBG (last 3)  Recent Labs    07/15/18 0727 07/15/18 0826 07/15/18 0922  GLUCAP 132* 142* 155*    P: Continue insulin drip Once sodium is controlled we can decrease D5W   Dusky left index and middle fingers, pulses intact New anticoagulation No further interventions planned  Disposition / Summary of Today's Plan 07/15/18   Continue full mechanical ventilatory support Is now a DNR after discussion per the MD and father Further de-escalation of care is planned in the near future Stabilized for now Sodium is improving   Diet: TF  Pain/Anxiety/Delirium protocol (if indicated): Yes  VAP protocol (if indicated): Yes  DVT prophylaxis: argatroban  GI prophylaxis: N/A. Hyperglycemia protocol: insulin gtt Mobility: Bedrest.  Once extubated will need physical therapy and Occupational Therapy consult Code Status: No DNR, care has been involved Family Communication: 07/12/2018 father was called per physician and fully updated.  Labs (personally reviewed)  CBC: Recent Labs  Lab 07/11/18 0218 07/12/18 0440 07/13/18 0454 07/14/18 0436 07/15/18 0414  WBC 15.7* 12.4* 12.4* 15.1* 12.1*  NEUTROABS  --   --   --   --  10.8*  HGB 9.1* 8.0* 8.3* 8.7* 8.1*  HCT 32.3*  28.7* 30.2* 32.4* 30.1*  MCV 110.2* 107.9* 108.6* 108.0* 110.3*  PLT 124* 122* 135* 185 096    Basic Metabolic Panel: Recent Labs  Lab 07/11/18 0218  07/12/18 0440 07/13/18 0454 07/13/18 1615 07/14/18 0436 07/14/18 1352 07/15/18 0414  NA 149*   < > 146* 146*  147* 152* 153* 151* 149*  K 3.3*   < > 3.0* 3.7  3.6 3.3* 3.2* 3.8 4.1  CL 116*   < > 113* 115*  116* 120* 124* 120* 122*  CO2 24   < > 26 25  25 26 23 22 23   GLUCOSE 177*   < > 191* 156*  157* 146* 159* 173* 162*  BUN 33*   < > 46* 48*  49* 47* 45* 51* 57*  CREATININE 1.79*   < > 1.86* 1.61*  1.61* 1.55* 1.58* 1.61* 1.66*  CALCIUM 10.4*   < > 10.4* 10.7*  10.7*  11.4* 11.5* 11.4* 11.4*  MG 3.0*  --  2.7* 2.9*  --  3.1*  --  2.8*  PHOS 4.1   < > 5.1*  5.0* 4.8*  4.8* 2.3* 2.7 3.6 4.6   < > = values in this interval not displayed.   GFR: Estimated Creatinine Clearance: 71.7 mL/min (A) (by C-G formula based on SCr of 1.66 mg/dL (H)). Recent Labs  Lab 07/12/18 0440 07/13/18 0454 07/14/18 0436 07/15/18 0414  WBC 12.4* 12.4* 15.1* 12.1*    Liver Function Tests: Recent Labs  Lab 07/13/18 0454 07/13/18 1615 07/14/18 0436 07/14/18 1352 07/15/18 0414  ALBUMIN 2.0* 2.1* 2.2* 2.1* 2.0*   No results for input(s): LIPASE, AMYLASE in the last 168 hours. No results for input(s): AMMONIA in the last 168 hours.  ABG    Component Value Date/Time   PHART 7.334 (L) 07/14/2018 0605   PCO2ART 43.5 07/14/2018 0605   PO2ART 220.0 (H) 07/14/2018 0605   HCO3 23.1 07/14/2018 0605   TCO2 24 07/14/2018 0605   ACIDBASEDEF 3.0 (H) 07/14/2018 0605   O2SAT 100.0 07/14/2018 0605     Coagulation Profile: No results for input(s): INR, PROTIME in the last 168 hours.  Cardiac Enzymes: No results for input(s): CKTOTAL, CKMB, CKMBINDEX, TROPONINI in the last 168 hours.    CBG: Recent Labs  Lab 07/15/18 0446 07/15/18 0610 07/15/18 0727 07/15/18 0826 07/15/18 0922  GLUCAP 155* 152* 132* 142* 155*    App cct 30 min  Richardson Landry Rawlin Reaume ACNP Maryanna Shape PCCM Pager [phone] till 1 pm If no answer page [phone] 07/15/2018, 9:27 AM

## 2018-07-15 NOTE — Progress Notes (Signed)
[facility] NEPHROLOGY PROGRESS NOTE  Assessment/ Plan: Pt is a 57 y.o. yo male with history of bipolar disorder on lithium, type 2 diabetes, hypertension, prostate cancer, OSA admitted with altered mental status, lithium toxicity.  Patient underwent dialysis and a CRRT for lithium toxicity and hyponatremia.  #Hyponatremia due to nephrogenic diabetes insipidus: Failed trial with DDAVP.  Patient's urine output is improving to 3.5 L in 24 hours.  Continue amiloride, free water 500 cc every 4 hour via tube, D5W at 150 cc an hour.  Serum sodium level 149 today.  Continue to monitor.  Continue insulin drip while in D5W.  #Acute kidney injury likely hemodynamically mediated and lithium toxicity: Serum creatinine level is stable at 1.6.  Required CRRT from 11/1 to 11/4.  Avoid nephrotoxins.  #Hypotension: Volume status:  #Hypo-kalemia: Improved  #Acute metabolic encephalopathy: Troponin mental status.  #Acute respiratory failure on mechanical ventilation: Pulmonary critical care.  #Goals of care: DNR status and possible comfort care by Monday noted.  Subjective: Seen and examined at bedside.  Remains sedated, intubated.  Objective Vital signs in last 24 hours: Vitals:   07/15/18 1000 07/15/18 1100 07/15/18 1122 07/15/18 1200  BP: (!) 107/57 (!) 99/56 (!) 99/59   Pulse: 80 74 85   Resp: 16 16 16    Temp:    98.9 F (37.2 C)  TempSrc:    Axillary  SpO2: 100% 100% 100%   Weight:      Height:       Weight change: 0 kg  Intake/Output Summary (Last 24 hours) at 07/15/2018 1255 Last data filed at 07/15/2018 1100 Gross per 24 hour  Intake 4799.85 ml  Output 4275 ml  Net 524.85 ml       Labs: Basic Metabolic Panel: Recent Labs  Lab 07/14/18 0436 07/14/18 1352 07/15/18 0414  NA 153* 151* 149*  K 3.2* 3.8 4.1  CL 124* 120* 122*  CO2 23 22 23   GLUCOSE 159* 173* 162*  BUN 45* 51* 57*  CREATININE 1.58* 1.61* 1.66*  CALCIUM 11.5* 11.4* 11.4*  PHOS 2.7 3.6 4.6    Liver Function Tests: Recent Labs  Lab 07/14/18 0436 07/14/18 1352 07/15/18 0414  ALBUMIN 2.2* 2.1* 2.0*   No results for input(s): LIPASE, AMYLASE in the last 168 hours. No results for input(s): AMMONIA in the last 168 hours. CBC: Recent Labs  Lab 07/11/18 0218 07/12/18 0440 07/13/18 0454 07/14/18 0436 07/15/18 0414  WBC 15.7* 12.4* 12.4* 15.1* 12.1*  NEUTROABS  --   --   --   --  10.8*  HGB 9.1* 8.0* 8.3* 8.7* 8.1*  HCT 32.3* 28.7* 30.2* 32.4* 30.1*  MCV 110.2* 107.9* 108.6* 108.0* 110.3*  PLT 124* 122* 135* 185 156   Cardiac Enzymes: No results for input(s): CKTOTAL, CKMB, CKMBINDEX, TROPONINI in the last 168 hours. CBG: Recent Labs  Lab 07/15/18 0826 07/15/18 0922 07/15/18 1023 07/15/18 1127 07/15/18 1234  GLUCAP 142* 155* 167* 169* 153*    Iron Studies: No results for input(s): IRON, TIBC, TRANSFERRIN, FERRITIN in the last 72 hours. Studies/Results: Dg Chest Port 1 View  Result Date: 07/15/2018 CLINICAL DATA:  Respiratory failure. EXAM: PORTABLE CHEST 1 VIEW COMPARISON:  07/14/2018 and 07/13/2018 FINDINGS: Right IJ sheath and left IJ central venous catheter unchanged. Enteric tube courses through the region of the stomach and off the inferior portion of the film as tip is not visualized. Endotracheal tube has tip 4.5 cm above the carina. Lungs are adequately inflated with mild linear density over the  right midlung and left base likely atelectasis. Minimal prominence of the perihilar markings left greater than right likely due to mild vascular congestion. Stable cardiomegaly. Remainder of the exam is unchanged. IMPRESSION: Stable cardiomegaly with suggestion of mild vascular congestion and minimal bilateral linear atelectasis. Tubes and lines as described. Electronically Signed   By: Marin Olp M.D.   On: 07/15/2018 07:52   Dg Chest Port 1 View  Result Date: 07/14/2018 CLINICAL DATA:  Intubation EXAM: PORTABLE CHEST 1 VIEW COMPARISON:  07/13/2018 FINDINGS:  Endotracheal tube tip measures 4.9 cm above the carina. Enteric tube tip is off the field of view but below the left hemidiaphragm. Right central venous catheter with tip over the cavoatrial junction region. Left central venous catheter with tip over the expected location of the brachiocephalic artery. This is been pulled back since the previous study. Shallow inspiration with atelectasis in the lung bases. Probable small left pleural effusion. No airspace disease or consolidation. No pneumothorax. IMPRESSION: Appliances appear in satisfactory location. Shallow inspiration with atelectasis in the lung bases. Probable small left pleural effusion. Electronically Signed   By: Lucienne Capers M.D.   On: 07/14/2018 05:58    Medications: Infusions: . sodium chloride 500 mL (07/09/18 1717)  . argatroban 0.86 mcg/kg/min (07/15/18 1150)  . dextrose 150 mL/hr at 07/15/18 1100  . feeding supplement (VITAL HIGH PROTEIN) 30 mL/hr at 07/15/18 1100  . fentaNYL infusion INTRAVENOUS 225 mcg/hr (07/15/18 1100)  . insulin 4.8 Units/hr (07/15/18 1236)  . sodium chloride      Scheduled Medications: . aMILoride  10 mg Oral Daily  . aspirin  81 mg Per Tube Daily  . chlorhexidine gluconate (MEDLINE KIT)  15 mL Mouth Rinse BID  . Chlorhexidine Gluconate Cloth  6 each Topical Q0600  . citalopram  40 mg Per Tube Daily  . clonazepam  1 mg Per Tube BID  . feeding supplement (PRO-STAT SUGAR FREE 64)  60 mL Per Tube QID  . finasteride  5 mg Oral Daily  . folic acid  1 mg Per Tube Daily  . free water  500 mL Per Tube Q4H  . lamoTRIgine  200 mg Per Tube QHS  . mouth rinse  15 mL Mouth Rinse 10 times per day  . methadone  10 mg Oral Q12H  . multivitamin  15 mL Per Tube Daily  . nystatin   Topical BID  . pantoprazole sodium  40 mg Per Tube Daily  . QUEtiapine  100 mg Per Tube BID  . tamsulosin  0.8 mg Oral Daily  . thiamine  100 mg Per Tube Daily    have reviewed scheduled and prn medications.  Physical  Exam: General: Intubated, sedated Heart:RRR, s1s2 nl Lungs:On bilateral, no wheezing Abdomen:soft, Non-tender, non-distended Extremities:No edema   Artist Bloom Tanna Furry 07/15/2018,12:55 PM  LOS: 22 days

## 2018-07-16 DIAGNOSIS — T56891S Toxic effect of other metals, accidental (unintentional), sequela: Secondary | ICD-10-CM

## 2018-07-16 LAB — RENAL FUNCTION PANEL
ANION GAP: 5 (ref 5–15)
Albumin: 2 g/dL — ABNORMAL LOW (ref 3.5–5.0)
Albumin: 2 g/dL — ABNORMAL LOW (ref 3.5–5.0)
Anion gap: <3 — ABNORMAL LOW (ref 5–15)
BUN: 62 mg/dL — ABNORMAL HIGH (ref 6–20)
BUN: 62 mg/dL — AB (ref 6–20)
CHLORIDE: 123 mmol/L — AB (ref 98–111)
CHLORIDE: 121 mmol/L — AB (ref 98–111)
CO2: 22 mmol/L (ref 22–32)
CO2: 25 mmol/L (ref 22–32)
CREATININE: 1.52 mg/dL — AB (ref 0.61–1.24)
Calcium: 11.2 mg/dL — ABNORMAL HIGH (ref 8.9–10.3)
Calcium: 11.1 mg/dL — ABNORMAL HIGH (ref 8.9–10.3)
Creatinine, Ser: 1.58 mg/dL — ABNORMAL HIGH (ref 0.61–1.24)
GFR calc Af Amer: 54 mL/min — ABNORMAL LOW (ref >60)
GFR calc Af Amer: 57 mL/min — ABNORMAL LOW (ref >60)
GFR calc non Af Amer: 47 mL/min — ABNORMAL LOW (ref >60)
GFR, EST NON AFRICAN AMERICAN: 49 mL/min — AB (ref >60)
GLUCOSE: 230 mg/dL — AB (ref 70–99)
Glucose, Bld: 142 mg/dL — ABNORMAL HIGH (ref 70–99)
POTASSIUM: 4.5 mmol/L (ref 3.5–5.1)
POTASSIUM: 4.6 mmol/L (ref 3.5–5.1)
Phosphorus: 4.4 mg/dL (ref 2.5–4.6)
Phosphorus: 3.8 mg/dL (ref 2.5–4.6)
Sodium: 150 mmol/L — ABNORMAL HIGH (ref 135–145)
Sodium: 147 mmol/L — ABNORMAL HIGH (ref 135–145)

## 2018-07-16 LAB — GLUCOSE, CAPILLARY
GLUCOSE-CAPILLARY: 159 mg/dL — AB (ref 70–99)
GLUCOSE-CAPILLARY: 129 mg/dL — AB (ref 70–99)
GLUCOSE-CAPILLARY: 105 mg/dL — AB (ref 70–99)
GLUCOSE-CAPILLARY: 168 mg/dL — AB (ref 70–99)
GLUCOSE-CAPILLARY: 160 mg/dL — AB (ref 70–99)
GLUCOSE-CAPILLARY: 169 mg/dL — AB (ref 70–99)
GLUCOSE-CAPILLARY: 199 mg/dL — AB (ref 70–99)
GLUCOSE-CAPILLARY: 196 mg/dL — AB (ref 70–99)
Glucose-Capillary: 155 mg/dL — ABNORMAL HIGH (ref 70–99)
Glucose-Capillary: 163 mg/dL — ABNORMAL HIGH (ref 70–99)
Glucose-Capillary: 165 mg/dL — ABNORMAL HIGH (ref 70–99)
Glucose-Capillary: 166 mg/dL — ABNORMAL HIGH (ref 70–99)
Glucose-Capillary: 176 mg/dL — ABNORMAL HIGH (ref 70–99)
Glucose-Capillary: 177 mg/dL — ABNORMAL HIGH (ref 70–99)
Glucose-Capillary: 183 mg/dL — ABNORMAL HIGH (ref 70–99)
Glucose-Capillary: 190 mg/dL — ABNORMAL HIGH (ref 70–99)
Glucose-Capillary: 197 mg/dL — ABNORMAL HIGH (ref 70–99)

## 2018-07-16 LAB — CBC
HEMATOCRIT: 25.6 % — AB (ref 39.0–52.0)
HEMOGLOBIN: 7 g/dL — AB (ref 13.0–17.0)
MCH: 30 pg (ref 26.0–34.0)
MCHC: 27.3 g/dL — AB (ref 30.0–36.0)
MCV: 109.9 fL — ABNORMAL HIGH (ref 80.0–100.0)
Platelets: 184 10*3/uL (ref 150–400)
RBC: 2.33 MIL/uL — ABNORMAL LOW (ref 4.22–5.81)
RDW: 15.3 % (ref 11.5–15.5)
WBC: 12.1 10*3/uL — ABNORMAL HIGH (ref 4.0–10.5)
nRBC: 0.3 % — ABNORMAL HIGH (ref 0.0–0.2)

## 2018-07-16 LAB — APTT: aPTT: 52 seconds — ABNORMAL HIGH (ref 24–36)

## 2018-07-16 LAB — MAGNESIUM: Magnesium: 2.9 mg/dL — ABNORMAL HIGH (ref 1.7–2.4)

## 2018-07-16 MED ORDER — DEXTROSE 5 % IV SOLN
INTRAVENOUS | Status: DC
  Administered 2018-07-16 – 2018-07-17 (×4): via INTRAVENOUS

## 2018-07-16 MED ORDER — INSULIN REGULAR(HUMAN) IN NACL 100-0.9 UT/100ML-% IV SOLN
INTRAVENOUS | Status: DC
  Administered 2018-07-16: 1 [IU]/h via INTRAVENOUS
  Administered 2018-07-17: 5.8 [IU]/h via INTRAVENOUS
  Filled 2018-07-16 (×2): qty 100

## 2018-07-16 NOTE — Progress Notes (Signed)
Notified Elink secreatary Gretchen of the need to remove patient from [employer] at 2015 and begin Phase 3 of Glycemic protocol. Levimer order changed from 10units to 20units based on protocol order set. Advised by Elzie Rings RN to give 10 units of Levimer at 2200 to bring the patient current with his dosing.

## 2018-07-16 NOTE — Progress Notes (Addendum)
[facility] NEPHROLOGY PROGRESS NOTE  Assessment/ Plan: Pt is a 58 y.o. yo male with history of bipolar disorder on lithium, type 2 diabetes, hypertension, prostate cancer, OSA admitted with altered mental status, lithium toxicity.  Patient underwent dialysis and a CRRT for lithium toxicity and hyponatremia.  #Hyponatremia due to nephrogenic diabetes insipidus: Failed trial with DDAVP.  On amiloride.  Patient's urine output 5.7 L in 24 hours.  Serum sodium level elevated to 150 today since D5W were discontinued last night by ICU.  -I discussed with ICU.  Plan to continue with D5W at 150 cc an hour, insulin drip and free water 500 cc every 4 hour from the tube feed.  Monitor serum BMP.  Possible terminal extubation tomorrow noted.   #Acute kidney injury likely hemodynamically mediated and lithium toxicity: Serum creatinine level is stable at 1.5.  Required CRRT from 11/1 to 11/4.  Avoid nephrotoxins.  #Hypotension/Volume status: Pressure better controlled today.  He is off pressors.  #Hypo-kalemia: Improved  #Acute metabolic encephalopathy: On vent.  He opens eyes with the name today.  #Acute respiratory failure on mechanical ventilation: Pulmonary critical care.  #Goals of care: DNR status and possible comfort care by Monday noted.  Poor prognosis.  Subjective: Seen and examined at bedside.  D5W fluid was discontinued by ICU. He remains intubated.  No clinical improvement.  Review of systems limited.  Objective Vital signs in last 24 hours: Vitals:   07/16/18 1100 07/16/18 1130 07/16/18 1203 07/16/18 1204  BP: (!) 99/55 (!) 105/55 (!) 110/57   Pulse: 80 78 82   Resp: 14 14 16    Temp:      TempSrc:      SpO2: 99% 99% 99% 99%  Weight:      Height:       Weight change: -1.6 kg  Intake/Output Summary (Last 24 hours) at 07/16/2018 1226 Last data filed at 07/16/2018 1100 Gross per 24 hour  Intake 2243.62 ml  Output 6250 ml  Net -4006.38 ml       Labs: Basic  Metabolic Panel: Recent Labs  Lab 07/15/18 0414 07/15/18 1522 07/16/18 0526  NA 149* 142 150*  K 4.1 4.2 4.5  CL 122* 115* 123*  CO2 23 22 22   GLUCOSE 162* 159* 142*  BUN 57* 59* 62*  CREATININE 1.66* 1.56* 1.58*  CALCIUM 11.4* 10.9* 11.2*  PHOS 4.6 4.6 4.4   Liver Function Tests: Recent Labs  Lab 07/15/18 0414 07/15/18 1522 07/16/18 0526  ALBUMIN 2.0* 2.0* 2.0*   No results for input(s): LIPASE, AMYLASE in the last 168 hours. No results for input(s): AMMONIA in the last 168 hours. CBC: Recent Labs  Lab 07/12/18 0440 07/13/18 0454 07/14/18 0436 07/15/18 0414 07/16/18 0526  WBC 12.4* 12.4* 15.1* 12.1* 12.1*  NEUTROABS  --   --   --  10.8*  --   HGB 8.0* 8.3* 8.7* 8.1* 7.0*  HCT 28.7* 30.2* 32.4* 30.1* 25.6*  MCV 107.9* 108.6* 108.0* 110.3* 109.9*  PLT 122* 135* 185 156 184   Cardiac Enzymes: No results for input(s): CKTOTAL, CKMB, CKMBINDEX, TROPONINI in the last 168 hours. CBG: Recent Labs  Lab 07/15/18 2026 07/16/18 0035 07/16/18 0347 07/16/18 0712 07/16/18 1114  GLUCAP 159* 155* 129* 105* 168*    Iron Studies: No results for input(s): IRON, TIBC, TRANSFERRIN, FERRITIN in the last 72 hours. Studies/Results: Dg Abd 1 View  Result Date: 07/15/2018 CLINICAL DATA:  Aspiration risk. EXAM: ABDOMEN - 1 VIEW COMPARISON:  Radiograph of July 13, 2018. FINDINGS: The bowel gas pattern is normal. No radio-opaque calculi or other significant radiographic abnormality are seen. Distal tip of nasogastric tube seen in expected position of the stomach. IMPRESSION: No evidence of bowel obstruction or ileus. Distal tip of nasogastric tube seen in expected position of the stomach. Electronically Signed   By: Marijo Conception, M.D.   On: 07/15/2018 18:57   Dg Chest Port 1 View  Result Date: 07/15/2018 CLINICAL DATA:  Aspiration risk. EXAM: PORTABLE CHEST 1 VIEW COMPARISON:  Radiograph of same day FINDINGS: Stable cardiomegaly. No pneumothorax is noted. No pleural effusion is  noted. Endotracheal and nasogastric tubes are in good position. Right internal jugular catheter is unchanged in position. Right lung is clear. Left basilar airspace opacity is noted which is increased compared to prior exam and consistent with worsening pneumonia or atelectasis. Bony thorax is unremarkable. IMPRESSION: Stable support apparatus. Increased left basilar opacity is noted consistent with worsening pneumonia or atelectasis. Electronically Signed   By: Marijo Conception, M.D.   On: 07/15/2018 18:56   Dg Chest Port 1 View  Result Date: 07/15/2018 CLINICAL DATA:  Respiratory failure. EXAM: PORTABLE CHEST 1 VIEW COMPARISON:  07/14/2018 and 07/13/2018 FINDINGS: Right IJ sheath and left IJ central venous catheter unchanged. Enteric tube courses through the region of the stomach and off the inferior portion of the film as tip is not visualized. Endotracheal tube has tip 4.5 cm above the carina. Lungs are adequately inflated with mild linear density over the right midlung and left base likely atelectasis. Minimal prominence of the perihilar markings left greater than right likely due to mild vascular congestion. Stable cardiomegaly. Remainder of the exam is unchanged. IMPRESSION: Stable cardiomegaly with suggestion of mild vascular congestion and minimal bilateral linear atelectasis. Tubes and lines as described. Electronically Signed   By: Marin Olp M.D.   On: 07/15/2018 07:52    Medications: Infusions: . sodium chloride 500 mL (07/09/18 1717)  . argatroban 0.86 mcg/kg/min (07/16/18 1100)  . dextrose 150 mL/hr at 07/16/18 1213  . feeding supplement (VITAL HIGH PROTEIN) 30 mL/hr at 07/16/18 1100  . fentaNYL infusion INTRAVENOUS Stopped (07/16/18 1036)  . insulin 1 Units/hr (07/16/18 1225)  . sodium chloride      Scheduled Medications: . aMILoride  10 mg Oral Daily  . aspirin  81 mg Per Tube Daily  . chlorhexidine gluconate (MEDLINE KIT)  15 mL Mouth Rinse BID  . Chlorhexidine Gluconate  Cloth  6 each Topical Q0600  . citalopram  40 mg Per Tube Daily  . clonazepam  1 mg Per Tube BID  . feeding supplement (PRO-STAT SUGAR FREE 64)  60 mL Per Tube QID  . finasteride  5 mg Oral Daily  . folic acid  1 mg Per Tube Daily  . free water  500 mL Per Tube Q4H  . lamoTRIgine  200 mg Per Tube QHS  . mouth rinse  15 mL Mouth Rinse 10 times per day  . methadone  10 mg Oral Q12H  . multivitamin  15 mL Per Tube Daily  . nystatin   Topical BID  . pantoprazole sodium  40 mg Per Tube Daily  . QUEtiapine  100 mg Per Tube BID  . tamsulosin  0.8 mg Oral Daily  . thiamine  100 mg Per Tube Daily    have reviewed scheduled and prn medications.  Physical Exam: General: Intubated, sedated Heart:RRR, s1s2 nl Lungs: Coarse breath sound bilateral, no wheezing Abdomen: Soft, nontender Extremities: Trace edema  Brenleigh Collet Prasad Audrielle Cox 07/16/2018,12:26 PM  LOS: 23 days

## 2018-07-16 NOTE — Progress Notes (Signed)
NAME:  Frank Cox, MRN:  [id number], DOB:  [birth year], LOS: 81 ADMISSION DATE:  06/20/2018, CONSULTATION DATE:  06/11/2018 REFERRING MD:  Stark Jock, ED [facility], CHIEF COMPLAINT:  Alerted mental status.  Brief History   57 year old man who requires urgent hemodialysis for acute on chronic lithium toxicity. Has been increasingly confused, experiencing multiple falls over the last week.  Li level 2.37 (upper limit 1.2) and nephrology wishes to dialyze the patient.   He was transferred out of ICU on 10/20.  On 10/24, he had agitation that did not respond to ativan or haldol; therefore, PCCM was called back for transfer to ICU.  Past Medical History  Bipolar affective disorder, T2DM on OHA, HTN, Prostate cancer, OSA on CPAP.  Significant Hospital Events   10/18 Admit with acute renal failure (cr 1.61) Foley catheter > 2 L immediately.  Underwent emergent dialysis for lithium toxicity 10/19 Lithium normalized.   10/20 Tx to SDU 10/24 Tx back to ICU due to agitation 10/26 PCCM called back for AMS, intermittent agitation 10/28 Intubated  11/4 CVVHD held 07/11/2018 increase FiO2 needs treated with Lasix twice during the night 07/13/2018 extubated 07/14/2018 reintubated  Consults: date of consult/date signed off & final recs:  Nephrology 10/18 - recommending HD Poison Control - recommending hydration, following serial Li levels. Psych 10/19 - do not restart lithium, needs psych consult once encephalopathy clears, recommending inpatient psych admission once medically cleared Urology 10/19 - input regarding possible obstructive uropathy, they recommend keeping foley in and follow up as outpatient 10/29 ID consult for fever 10/31 vascular surgery consult> R hand dusky with HIT  Procedures (surgical and bedside):  10/29 LIJ CVC  11/1 [facility] HD Cath, started CRRT   Significant Diagnostic Tests:  CT head 10/15 > negative. Renal US 10/18 > diabetic glomerular nephropathy.  No obstruction.  Bilateral benign  appearing renal cyts.  CT Head 10/29 >> Neg for acute process, enlarge orbital vein? CT Abd Pelvis 10/29 >> Negative for infectious source, fluid in colon   EEG 10/29 >>> slowing, no seizure   MRI Brain 11/1 >>> unremarkable   Micro Data:  Urine 10/18 > negative Blood 10/20 > negative Blood 10/23 > negative  Urine 10/23 > negative Penile culture 10/22 >> staph lugdunensis (S-, entercoccus (vanco sens) UCx 10/28 >> NGTD  Blood 10/28 >> Coag neg staph 1/4   Trach Asp 10/28 >> NGTD    Antimicrobials:  Zosyn 10/20 > 10/21 Ceftriaxone 10/21 > 10/23 Unasyn 10/26 >>> off Oral Vanco 10/28, c diff neg, stopped   Cefepime 10/28 >> 10/31 Vanco 10/28 >> 10/31 Metronidazole 10/28, stopped Fluconazole 10/29 >> off  Subjective:  Continue current treatment.  Plans for one-way extubation on Monday.  Objective   Blood pressure (!) 96/53, pulse 67, temperature 98.2 F (36.8 C), temperature source Axillary, resp. rate 16, height 6\' 2"  (1.88 m), weight (!) 136.4 kg, SpO2 100 %.    Vent Mode: PCV FiO2 (%):  [40 %] 40 % Set Rate:  [16 bmp] 16 bmp PEEP:  [8 cmH20] 8 cmH20 Plateau Pressure:  [14 cmH20-20 cmH20] 18 cmH20   Intake/Output Summary (Last 24 hours) at 07/16/2018 0726 Last data filed at 07/16/2018 0600 Gross per 24 hour  Intake 3044.64 ml  Output 6050 ml  Net -3005.36 ml   Filed Weights   07/14/18 0500 07/15/18 0500 07/16/18 0500  Weight: (!) 138 kg (!) 138 kg (!) 136.4 kg   Examination: General: Much more awake and cooperative less agitated HEENT:  JVD or lymphadenopathy is appreciated Neuro: Follows commands right side stronger than left, unable to track and follow CV: Heart sounds are regular PULM: even/non-labored, lungs bilaterally decreased in bases GE:XBMW, non-tender, bsx4 active  Extremities: Bilateral lower extremities with venous stasis 3+ edema, left forefinger ischemic changes.  Multiple areas of ecchymosis appreciated Skin: Multiple areas of  ecchymosis      Assessment & Plan:   Septic Shock, unclear source, possible aspiration after being obtunded due to AMS, small right sided infiltrate, could represent atelectasis; prior cx with staph and enterococcus from penile discharge, one blood cx +methicillin resistant staph species(suspect contaminant)    No fever last 24 hours P: Resolved   Acute Hypoxemic and Hypercarbic Respiratory Failure, principally due to altered mental status and poor airway protection, also some component of interstitial infiltrates P: 07/17/2018 we will wean but not extubate He is much more awake and cooperative One-way extubation is planned for 07/17/2018  Acute toxic, metabolic encephalopathy secondary to above  Agitated Delirium   - EEG with no seizure  -Metabolic disarray improved, lithium improved.  Suspect that delirium is now enlarged part due to his need for sedating medications P:  07/17/2018 much more responses on current psychotropic drug regimen Minimize IV sedation     acute on Chronic Lithium Toxicity with Acute Toxic Encephalopathy  Suspected nephrogenic DI, hypernatremia P: Monitor electrolytes and treat as needed  Bipolar Disorder  - patient denied suicidal intent.   - S/p psych eval 10/19 P:  Continue psychotropic medication he appears more awake and alert 07/16/2018  Probable OSA / OHS P: [place]  Acute on Chronic Renal Failure, Stage 2 CKD Nephrogenic DI from lithium Hypertnatremia Hyperchloremia Hypokalemia   P: Is off CVVH D5W discontinued per nephrology Sodium is noted to be to be 150 will defer to nephrology at this time  History bladder outlet obstruction  -s/p foley, patient of Dr. Tresa Moore, h/o of radiation tx for prostate Ca -spoke with urology, ok for nursing to change foley routine  P: Continue Foley   Macrocytic Anemia  Recent Labs    07/15/18 0414 07/16/18 0526  HGB 8.1* 7.0*    Monitor CBC Transfuse per  protocol   P: Following CBC   Thrombocytopenia, platelets are 184 on 07/16/2018 resolving Positive HIT Ab Probable HIT, SRA 101 P: Monitor CBC Transfuse per protocol  DM Hyperglycemia  CBG (last 3)  Recent Labs    07/16/18 0035 07/16/18 0347 07/16/18 0712  GLUCAP 155* 129* 105*    P: Transition per hyperglycemia protocol Once sodium is controlled we can decrease D5W   Dusky left index and middle fingers, pulses intact Remains on anticoagulation No surgical interventions are planned  Disposition / Summary of Today's Plan 07/16/18   We will continue full mechanical ventilatory support but do some weaning as he is more awake.  We anticipated one-way extubation on 07/17/2018 Needs a full DNR Sodium is elevated   Diet: TF  Pain/Anxiety/Delirium protocol (if indicated): Yes  VAP protocol (if indicated): Yes  DVT prophylaxis: argatroban  GI prophylaxis: N/A. Hyperglycemia protocol: insulin gtt Mobility: Bedrest.  Once extubated will need physical therapy and Occupational Therapy consult Code Status: No DNR, care has been involved Family Communication: 07/12/2018 father was called per physician and fully updated.  Labs (personally reviewed)  CBC: Recent Labs  Lab 07/12/18 0440 07/13/18 0454 07/14/18 0436 07/15/18 0414 07/16/18 0526  WBC 12.4* 12.4* 15.1* 12.1* 12.1*  NEUTROABS  --   --   --  10.8*  --  HGB 8.0* 8.3* 8.7* 8.1* 7.0*  HCT 28.7* 30.2* 32.4* 30.1* 25.6*  MCV 107.9* 108.6* 108.0* 110.3* 109.9*  PLT 122* 135* 185 156 680    Basic Metabolic Panel: Recent Labs  Lab 07/12/18 0440 07/13/18 0454  07/14/18 0436 07/14/18 1352 07/15/18 0414 07/15/18 1522 07/16/18 0526  NA 146* 146*  147*   < > 153* 151* 149* 142 150*  K 3.0* 3.7  3.6   < > 3.2* 3.8 4.1 4.2 4.5  CL 113* 115*  116*   < > 124* 120* 122* 115* 123*  CO2 26 25  25    < > 23 22 23 22 22   GLUCOSE 191* 156*  157*   < > 159* 173* 162* 159* 142*  BUN 46* 48*  49*   < > 45* 51* 57*  59* 62*  CREATININE 1.86* 1.61*  1.61*   < > 1.58* 1.61* 1.66* 1.56* 1.58*  CALCIUM 10.4* 10.7*  10.7*   < > 11.5* 11.4* 11.4* 10.9* 11.2*  MG 2.7* 2.9*  --  3.1*  --  2.8*  --  2.9*  PHOS 5.1*  5.0* 4.8*  4.8*   < > 2.7 3.6 4.6 4.6 4.4   < > = values in this interval not displayed.   GFR: Estimated Creatinine Clearance: 74.9 mL/min (A) (by C-G formula based on SCr of 1.58 mg/dL (H)). Recent Labs  Lab 07/13/18 0454 07/14/18 0436 07/15/18 0414 07/16/18 0526  WBC 12.4* 15.1* 12.1* 12.1*    Liver Function Tests: Recent Labs  Lab 07/14/18 0436 07/14/18 1352 07/15/18 0414 07/15/18 1522 07/16/18 0526  ALBUMIN 2.2* 2.1* 2.0* 2.0* 2.0*   No results for input(s): LIPASE, AMYLASE in the last 168 hours. No results for input(s): AMMONIA in the last 168 hours.  ABG    Component Value Date/Time   PHART 7.334 (L) 07/14/2018 0605   PCO2ART 43.5 07/14/2018 0605   PO2ART 220.0 (H) 07/14/2018 0605   HCO3 23.1 07/14/2018 0605   TCO2 24 07/14/2018 0605   ACIDBASEDEF 3.0 (H) 07/14/2018 0605   O2SAT 100.0 07/14/2018 0605     Coagulation Profile: No results for input(s): INR, PROTIME in the last 168 hours.  Cardiac Enzymes: No results for input(s): CKTOTAL, CKMB, CKMBINDEX, TROPONINI in the last 168 hours.    CBG: Recent Labs  Lab 07/15/18 1911 07/15/18 2026 07/16/18 0035 07/16/18 0347 07/16/18 0712  GLUCAP 172* 159* 155* 129* 105*    App cct 30 min  Richardson Landry Jaime Grizzell ACNP Maryanna Shape PCCM Pager [phone] till 1 pm If no answer page [phone] 07/16/2018, 7:26 AM

## 2018-07-16 NOTE — Progress Notes (Signed)
RT NOTE: Frank Landry Minor, NP told RT to wean patient. Patient is currently on CPAP/PSV 10/8. RT will continue to monitor.

## 2018-07-16 NOTE — Progress Notes (Signed)
[facility] for argatroban Indication: Cool hand / HIT  Patient Measurements: Height: 6\' 2"  (188 cm) Weight: (!) 300 lb 11.3 oz (136.4 kg) IBW/kg (Calculated) : 82.2  Vital Signs: Temp: 99.1 F (37.3 C) (11/10 1200) Temp Source: Axillary (11/10 1200) BP: 110/59 (11/10 1230) Pulse Rate: 83 (11/10 1230)  Labs: Recent Labs    07/14/18 0436  07/15/18 0414 07/15/18 1522 07/16/18 0526  HGB 8.7*  --  8.1*  --  7.0*  HCT 32.4*  --  30.1*  --  25.6*  PLT 185  --  156  --  184  APTT 52*  --  55*  --  52*  CREATININE 1.58*   < > 1.66* 1.56* 1.58*   < > = values in this interval not displayed.   Assessment: 89 yoM initially on heparin SQ for VTE prophylaxis with platelet drop to 41. 4T score at least 5. HIT antibody positive, OD 2.7. SRA is positive. Patient with dusky/cool hand per MD assessment but no intervention by vascular at this point. Pharmacy consulted to start IV argatroban for r/o clot  Currently on IV argatroban at 0.86 mcg/kg/min. H/H low stable, Plt improving. APTT stable at 52. No overt s/s of bleeding noted.   Goal of Therapy:  aPTT 50-90 seconds Monitor platelets by anticoagulation protocol: Yes   Plan:  Continue argatroban at 0.66mcg/kg/min (4.100mL/hr) Daily aPTT/CBC Monitor for s/s of bleeding   Salome Arnt, PharmD, BCPS Please see AMION for all pharmacy numbers 07/16/2018 12:38 PM

## 2018-07-17 ENCOUNTER — Inpatient Hospital Stay (HOSPITAL_COMMUNITY): Payer: 59

## 2018-07-17 DIAGNOSIS — Z515 Encounter for palliative care: Secondary | ICD-10-CM

## 2018-07-17 LAB — PHOSPHORUS: Phosphorus: 2.4 mg/dL — ABNORMAL LOW (ref 2.5–4.6)

## 2018-07-17 LAB — RENAL FUNCTION PANEL
Albumin: 2.2 g/dL — ABNORMAL LOW (ref 3.5–5.0)
Anion gap: 4 — ABNORMAL LOW (ref 5–15)
BUN: 49 mg/dL — AB (ref 6–20)
CALCIUM: 11.7 mg/dL — AB (ref 8.9–10.3)
CO2: 21 mmol/L — AB (ref 22–32)
CREATININE: 1.43 mg/dL — AB (ref 0.61–1.24)
Chloride: 129 mmol/L — ABNORMAL HIGH (ref 98–111)
GFR calc Af Amer: >60 mL/min (ref >60)
GFR calc non Af Amer: 53 mL/min — ABNORMAL LOW (ref >60)
GLUCOSE: 184 mg/dL — AB (ref 70–99)
Phosphorus: 2.4 mg/dL — ABNORMAL LOW (ref 2.5–4.6)
Potassium: 4.2 mmol/L (ref 3.5–5.1)
SODIUM: 154 mmol/L — AB (ref 135–145)

## 2018-07-17 LAB — APTT
APTT: 44 s — AB (ref 24–36)
aPTT: 49 seconds — ABNORMAL HIGH (ref 24–36)

## 2018-07-17 LAB — BASIC METABOLIC PANEL
Anion gap: 4 — ABNORMAL LOW (ref 5–15)
BUN: 50 mg/dL — AB (ref 6–20)
CHLORIDE: 129 mmol/L — AB (ref 98–111)
CO2: 21 mmol/L — AB (ref 22–32)
Calcium: 11.6 mg/dL — ABNORMAL HIGH (ref 8.9–10.3)
Creatinine, Ser: 1.38 mg/dL — ABNORMAL HIGH (ref 0.61–1.24)
GFR calc non Af Amer: 55 mL/min — ABNORMAL LOW (ref >60)
Glucose, Bld: 181 mg/dL — ABNORMAL HIGH (ref 70–99)
Potassium: 4.2 mmol/L (ref 3.5–5.1)
Sodium: 154 mmol/L — ABNORMAL HIGH (ref 135–145)

## 2018-07-17 LAB — CBC
HCT: 32.2 % — ABNORMAL LOW (ref 39.0–52.0)
Hemoglobin: 8.5 g/dL — ABNORMAL LOW (ref 13.0–17.0)
MCH: 29.2 pg (ref 26.0–34.0)
MCHC: 26.4 g/dL — AB (ref 30.0–36.0)
MCV: 110.7 fL — ABNORMAL HIGH (ref 80.0–100.0)
PLATELETS: 229 10*3/uL (ref 150–400)
RBC: 2.91 MIL/uL — ABNORMAL LOW (ref 4.22–5.81)
RDW: 15.2 % (ref 11.5–15.5)
WBC: 12.2 10*3/uL — ABNORMAL HIGH (ref 4.0–10.5)
nRBC: 0.3 % — ABNORMAL HIGH (ref 0.0–0.2)

## 2018-07-17 LAB — GLUCOSE, CAPILLARY
GLUCOSE-CAPILLARY: 158 mg/dL — AB (ref 70–99)
GLUCOSE-CAPILLARY: 157 mg/dL — AB (ref 70–99)
GLUCOSE-CAPILLARY: 185 mg/dL — AB (ref 70–99)
GLUCOSE-CAPILLARY: 167 mg/dL — AB (ref 70–99)
Glucose-Capillary: 150 mg/dL — ABNORMAL HIGH (ref 70–99)
Glucose-Capillary: 151 mg/dL — ABNORMAL HIGH (ref 70–99)
Glucose-Capillary: 158 mg/dL — ABNORMAL HIGH (ref 70–99)
Glucose-Capillary: 161 mg/dL — ABNORMAL HIGH (ref 70–99)
Glucose-Capillary: 165 mg/dL — ABNORMAL HIGH (ref 70–99)
Glucose-Capillary: 176 mg/dL — ABNORMAL HIGH (ref 70–99)

## 2018-07-17 LAB — MAGNESIUM: Magnesium: 2.8 mg/dL — ABNORMAL HIGH (ref 1.7–2.4)

## 2018-07-17 MED ORDER — PROPOFOL 1000 MG/100ML IV EMUL
5.0000 ug/kg/min | INTRAVENOUS | Status: DC
  Administered 2018-07-17: 5 ug/kg/min via INTRAVENOUS
  Administered 2018-07-17 – 2018-07-18 (×5): 80 ug/kg/min via INTRAVENOUS
  Filled 2018-07-17 (×4): qty 100
  Filled 2018-07-17: qty 200
  Filled 2018-07-17: qty 100

## 2018-07-17 MED ORDER — HALOPERIDOL LACTATE 5 MG/ML IJ SOLN: 0.5000 mg | INTRAMUSCULAR | Status: DC | PRN

## 2018-07-17 MED ORDER — MORPHINE SULFATE (PF) 4 MG/ML IV SOLN
INTRAVENOUS | Status: AC
  Administered 2018-07-17: 4 mg
  Filled 2018-07-17: qty 1

## 2018-07-17 MED ORDER — FENTANYL BOLUS VIA INFUSION
50.0000 ug | INTRAVENOUS | Status: DC | PRN
  Administered 2018-07-17 (×4): 50 ug via INTRAVENOUS
  Filled 2018-07-17: qty 50

## 2018-07-17 MED ORDER — GLYCOPYRROLATE 0.2 MG/ML IJ SOLN
0.2000 mg | INTRAMUSCULAR | Status: DC
  Administered 2018-07-17 – 2018-07-18 (×6): 0.2 mg via INTRAVENOUS
  Filled 2018-07-17 (×5): qty 1

## 2018-07-17 MED ORDER — MORPHINE SULFATE (PF) 4 MG/ML IV SOLN: 4.0000 mg | INTRAVENOUS | Status: DC | PRN

## 2018-07-17 MED ORDER — MORPHINE SULFATE (PF) 2 MG/ML IV SOLN: 2.0000 mg | INTRAVENOUS | Status: DC | PRN

## 2018-07-17 MED ORDER — HALOPERIDOL 0.5 MG PO TABS
0.5000 mg | ORAL_TABLET | ORAL | Status: DC | PRN
  Filled 2018-07-17: qty 1

## 2018-07-17 MED ORDER — FENTANYL BOLUS VIA INFUSION
100.0000 ug | INTRAVENOUS | Status: DC | PRN
  Administered 2018-07-17 (×5): 100 ug via INTRAVENOUS
  Filled 2018-07-17: qty 100

## 2018-07-17 MED ORDER — FENTANYL BOLUS VIA INFUSION
200.0000 ug | INTRAVENOUS | Status: DC | PRN
  Administered 2018-07-17 – 2018-07-18 (×4): 200 ug via INTRAVENOUS
  Administered 2018-07-18: 400 ug via INTRAVENOUS
  Filled 2018-07-17: qty 400

## 2018-07-17 MED ORDER — LORAZEPAM 2 MG/ML IJ SOLN
2.0000 mg | INTRAMUSCULAR | Status: DC | PRN
  Administered 2018-07-17: 2 mg via INTRAVENOUS
  Filled 2018-07-17: qty 1

## 2018-07-17 MED ORDER — LORAZEPAM 2 MG/ML IJ SOLN: 2.0000 mg | INTRAMUSCULAR | Status: AC

## 2018-07-17 MED ORDER — LORAZEPAM 2 MG/ML IJ SOLN
INTRAMUSCULAR | Status: AC
  Administered 2018-07-17: 2 mg
  Filled 2018-07-17: qty 1

## 2018-07-17 MED ORDER — FENTANYL BOLUS VIA INFUSION
75.0000 ug | INTRAVENOUS | Status: DC | PRN
  Filled 2018-07-17: qty 75

## 2018-07-17 MED ORDER — PROPOFOL 1000 MG/100ML IV EMUL
INTRAVENOUS | Status: AC
  Filled 2018-07-17: qty 100

## 2018-07-17 MED ORDER — HALOPERIDOL LACTATE 2 MG/ML PO CONC
0.5000 mg | ORAL | Status: DC | PRN
  Administered 2018-07-17: 0.5 mg via SUBLINGUAL
  Filled 2018-07-17 (×2): qty 0.3

## 2018-07-17 NOTE — Progress Notes (Signed)
?  Comfort care patient. Upon evening camera rounds it was noted that propofol gtt remains on post extubation. Per palliatve note they were discussing the possibility of leaving propofol on. Called bedside RN as this is not a normal order for this type of patient. RN will page palliative for clarification.

## 2018-07-17 NOTE — Progress Notes (Signed)
NAME:  Frank Cox, MRN:  [id number], DOB:  Jan 24, 1961, LOS: 24 ADMISSION DATE:  06/11/2018, CONSULTATION DATE:  06/12/2018 REFERRING MD:  Stark Jock, ED [facility], CHIEF COMPLAINT:  Alerted mental status.  Brief History   57 year old male with hx bipolar disorder, DM, HTN, OSA on cpap, prostate ca initially admitted 10/18 with acute on chronic lithium toxicity requiring urgent hemodialysis.  Course has been c/b delirium, ongoing encephalopathy, AKI, acute respiratory failure with failed extubation x 2.     Past Medical History  Bipolar affective disorder, T2DM on OHA, HTN, Prostate cancer, OSA on CPAP.  Significant Hospital Events   10/18 Admit with acute renal failure (cr 1.61) Foley catheter > 2 L immediately.  Underwent emergent dialysis for lithium toxicity 10/19 Lithium normalized.   10/20 Tx to SDU 10/24 Tx back to ICU due to agitation 10/26 PCCM called back for AMS, intermittent agitation 10/28 Intubated  11/4 CVVHD held 07/11/2018 increase FiO2 needs treated with Lasix twice during the night 07/13/2018 extubated 07/14/2018 reintubated  Consults: date of consult/date signed off & final recs:  Nephrology 10/18 - recommending HD Poison Control - recommending hydration, following serial Li levels. Psych 10/19 - do not restart lithium, needs psych consult once encephalopathy clears, recommending inpatient psych admission once medically cleared Urology 10/19 - input regarding possible obstructive uropathy, they recommend keeping foley in and follow up as outpatient 10/29 ID consult for fever 10/31 vascular surgery consult> R hand dusky with HIT  Procedures (surgical and bedside):  10/29 [facility] CVC  11/1 [facility] HD Cath, started CRRT   Significant Diagnostic Tests:  CT head 10/15 > negative. Renal US 10/18 > diabetic glomerular nephropathy.  No obstruction.  Bilateral benign appearing renal cyts.  CT Head 10/29 >> Neg for acute process, enlarge orbital vein? CT Abd Pelvis 10/29 >> Negative for  infectious source, fluid in colon   EEG 10/29 >>> slowing, no seizure   MRI Brain 11/1 >>> unremarkable   Micro Data:  Urine 10/18 > negative Blood 10/20 > negative Blood 10/23 > negative  Urine 10/23 > negative Penile culture 10/22 >> staph lugdunensis (S-, entercoccus (vanco sens) UCx 10/28 >> NGTD  Blood 10/28 >> Coag neg staph 1/4   Trach Asp 10/28 >> NGTD    Antimicrobials:  Zosyn 10/20 > 10/21 Ceftriaxone 10/21 > 10/23 Unasyn 10/26 >>> off Oral Vanco 10/28, c diff neg, stopped   Cefepime 10/28 >> 10/31 Vanco 10/28 >> 10/31 Metronidazole 10/28, stopped Fluconazole 10/29 >> off  Subjective:  No acute change overnight.  Intermittent agitation.  Now DNR.  Awaiting family - tentative plan for one-way extubation with transition to comfort if fails.   Objective   Blood pressure 131/65, pulse 95, temperature 100 F (37.8 C), temperature source Axillary, resp. rate 20, height 6\' 2"  (1.88 m), weight (!) 136.4 kg, SpO2 92 %.    Vent Mode: PCV FiO2 (%):  [40 %] 40 % Set Rate:  [16 bmp] 16 bmp PEEP:  [8 cmH20] 8 cmH20 Pressure Support:  [10 cmH20] 10 cmH20 Plateau Pressure:  [19 cmH20-25 cmH20] 25 cmH20   Intake/Output Summary (Last 24 hours) at 07/17/2018 0843 Last data filed at 07/17/2018 0600 Gross per 24 hour  Intake 3388.78 ml  Output 7150 ml  Net -3761.22 ml   Filed Weights   07/14/18 0500 07/15/18 0500 07/16/18 0500  Weight: (!) 138 kg (!) 138 kg (!) 136.4 kg   Examination: General: awake, intermittently agitated, not following commands, NAD  HEENT: mm moist,  no JVD, ETT  Neuro: awake, eyes open, kicking legs out of the bed, does not follow commands, RASS +1  CV: Heart sounds are regular PULM: resps even non labored on vent, few scattered rhonchi  GI: soft, non-tender, bsx4 active  Extremities: warm and dry, BLE venous stawsis, 1-2+ BLE edema, left forefinger ischemic changes   Assessment & Plan:    Acute Hypoxemic and Hypercarbic Respiratory Failure,  principally due to altered mental status and poor airway protection, also some component of interstitial infiltrates. Failed extubation x 2.  P: One-way extubation when family arrives  Comfort if fails  Daily assess need for lasix  F/u CXR    Acute toxic, metabolic encephalopathy secondary to above - slightly improved 11/10 Agitated Delirium   - EEG with no seizure  -Metabolic disarray improved, lithium improved.   P: Minimize sedation  PAD protocol  S/p CRRT for lithium - none further planned     acute on Chronic Lithium Toxicity with Acute Toxic Encephalopathy - s/p CRRT  Suspected nephrogenic DI, hypernatremia P: F/u chem  Renal following   Bipolar Disorder  - S/p psych eval 10/19 P: Continue celexa, seroquel, lamictal, clonopin  Monitor closely for oversedation  Wean fentanyl gtt as able  Continue methadone   Probable OSA / OHS P: Consider qhs cpap if tolerates extubation   Acute on Chronic Renal Failure, Stage 2 CKD Nephrogenic DI from lithium - no response to previous high dose desmopressin  Hypertnatremia Hyperchloremia Hypokalemia   P: Renal following  Continue D5, free water   History bladder outlet obstruction  -s/p foley, patient of Dr. Tresa Moore, h/o of radiation tx for prostate Ca -spoke with urology, ok for nursing to change foley routine  P: Continue Foley Continue flomax, finasteride  Macrocytic Anemia  P: Monitor CBC Transfuse per protocol    Thrombocytopenia  Positive HIT Ab Probable HIT, SRA 101 P: Monitor CBC Transfuse per protocol Argatroban   DM Hyperglycemia  P: Continue insulin gtt with transition for protocol  Likely continue to need gtt while on D5    Dusky left index and middle fingers, pulses intact - LUE doppler neg for arterial occlusion Remains on anticoagulation - argatroban -- ??d/c gtt  No surgical interventions are planned  Disposition / Summary of Today's Plan 07/17/18   Plan for one-way extubation when  family arrives.  Comfort if fails.    Diet: TF  Pain/Anxiety/Delirium protocol (if indicated): Yes  VAP protocol (if indicated): Yes  DVT prophylaxis: argatroban  GI prophylaxis: N/A. Hyperglycemia protocol: insulin gtt Mobility: Bedrest.  Once extubated will need physical therapy and Occupational Therapy consult Code Status: DNR Family Communication: 07/12/2018 father was called per physician and fully updated.  Labs (personally reviewed)  CBC: Recent Labs  Lab 07/13/18 0454 07/14/18 0436 07/15/18 0414 07/16/18 0526 07/17/18 0527  WBC 12.4* 15.1* 12.1* 12.1* 12.2*  NEUTROABS  --   --  10.8*  --   --   HGB 8.3* 8.7* 8.1* 7.0* 8.5*  HCT 30.2* 32.4* 30.1* 25.6* 32.2*  MCV 108.6* 108.0* 110.3* 109.9* 110.7*  PLT 135* 185 156 184 998    Basic Metabolic Panel: Recent Labs  Lab 07/13/18 0454  07/14/18 0436  07/15/18 0414 07/15/18 1522 07/16/18 0526 07/16/18 1517 07/17/18 0527  NA 146*  147*   < > 153*   < > 149* 142 150* 147* 154*  154*  K 3.7  3.6   < > 3.2*   < > 4.1 4.2 4.5 4.6 4.2  4.2  CL 115*  116*   < > 124*   < > 122* 115* 123* 121* 129*  129*  CO2 25  25   < > 23   < > 23 22 22 25  21*  21*  GLUCOSE 156*  157*   < > 159*   < > 162* 159* 142* 230* 184*  181*  BUN 48*  49*   < > 45*   < > 57* 59* 62* 62* 49*  50*  CREATININE 1.61*  1.61*   < > 1.58*   < > 1.66* 1.56* 1.58* 1.52* 1.43*  1.38*  CALCIUM 10.7*  10.7*   < > 11.5*   < > 11.4* 10.9* 11.2* 11.1* 11.7*  11.6*  MG 2.9*  --  3.1*  --  2.8*  --  2.9*  --  2.8*  PHOS 4.8*  4.8*   < > 2.7   < > 4.6 4.6 4.4 3.8 2.4*  2.4*   < > = values in this interval not displayed.   GFR: Estimated Creatinine Clearance: 82.7 mL/min (A) (by C-G formula based on SCr of 1.43 mg/dL (H)). Recent Labs  Lab 07/14/18 0436 07/15/18 0414 07/16/18 0526 07/17/18 0527  WBC 15.1* 12.1* 12.1* 12.2*    Liver Function Tests: Recent Labs  Lab 07/15/18 0414 07/15/18 1522 07/16/18 0526 07/16/18 1517 07/17/18 0527   ALBUMIN 2.0* 2.0* 2.0* 2.0* 2.2*   No results for input(s): LIPASE, AMYLASE in the last 168 hours. No results for input(s): AMMONIA in the last 168 hours.  ABG    Component Value Date/Time   PHART 7.334 (L) 07/14/2018 0605   PCO2ART 43.5 07/14/2018 0605   PO2ART 220.0 (H) 07/14/2018 0605   HCO3 23.1 07/14/2018 0605   TCO2 24 07/14/2018 0605   ACIDBASEDEF 3.0 (H) 07/14/2018 0605   O2SAT 100.0 07/14/2018 0605     Coagulation Profile: No results for input(s): INR, PROTIME in the last 168 hours.  Cardiac Enzymes: No results for input(s): CKTOTAL, CKMB, CKMBINDEX, TROPONINI in the last 168 hours.    CBG: Recent Labs  Lab 07/17/18 0337 07/17/18 0446 07/17/18 0601 07/17/18 0659 07/17/18 0750  GLUCAP 157* 185* 165* 151* 158*   Cc time 35 mins   Nickolas Madrid, NP 07/17/2018  8:43 AM Pager: [phone] or [phone]

## 2018-07-17 NOTE — Progress Notes (Addendum)
[facility] Progress Note    Assessment/ Plan:   57yo male with PMH of bipolar disorder on lithium, T2DM, HTN, prostate cancer, OSA on CPAP presented with AMS and frequent falls due to acute on chronic lithium toxicity, now s/p HD x1 10/18 with resolution of lithium toxicity. Continued to have agitation, fever, started on IV abx with ID in consult and initiated CRRT 11/1-11/4 for continued metabolic abnormalities and possible uremia with improvement in metabolic abnormalities, however continued to have encephalopathy.  1. AKI due to acute on chronic lithium toxicity - Lithium toxicity now resolved. S/p CRRT (11/1-11/4), Cr continues to improve, 1.43 this am, GFR 53. UOP 2.3 ml/kg/hr with net -4L last 24 hours. Weight down 4lbs today. AKI improving, however overall encephalopathy without much progress. Continues on D5W and free water to match outs. Will continue to maintain access. 2. Hypernatremia - likely due to nephrogenic DI given chronic lithium use. No response to previous high dose desmopressin x2, indicating partial DI with previous inability to initiate thiazides d/t AKI.  Na increased this am to 154. Continues on amiloride, D5W and free water flushes with insulin gtt. 3. HTN/volume status - intermittently hypertensive. Pitting edema improving, 1+ to shins bilaterally. 4. Electrolytes - K 4.2 Na 154 Co2 21 Cl 129 Phos 2.4 Calcium 11.7. Repleting as necessary.  5. Macrocytic Anemia - Hb 8.5, stable. Macrocytosis developed during admission, unclear etiology. Drop during admission likely due to initiation of CRRT and clotting x3 despite argatroban for presumed HIT. Plts improved 229. 6. AMS - remains encephalopathic. Brief improvement previously however required re-intubation.  7. Goals of Care - DNR status. Noted recommendations for comfort care if doesn't tolerate extubation today.  Subjective:   Sedated on vent.   Objective:   BP (!) 143/65   Pulse 99   Temp 100.2 F  (37.9 C) (Axillary)   Resp 19   Ht _0  (1.88 m)   Wt (!) 136.4 kg   SpO2 98%   BMI 38.61 kg/m   Intake/Output Summary (Last 24 hours) at 07/17/2018 0715 Last data filed at 07/17/2018 0600 Gross per 24 hour  Intake 3410.04 ml  Output 7500 ml  Net -4089.96 ml   Weight change:   Physical Exam:  Gen: obese male, on vent, sedated CVS: RRR Resp: CTAB, on vent, some desats noted Abd: +BS, soft Ext: pitting edema improved, 1+ to shins bilaterally.  Imaging: Dg Abd 1 View  Result Date: 07/15/2018 CLINICAL DATA:  Aspiration risk. EXAM: ABDOMEN - 1 VIEW COMPARISON:  Radiograph of July 13, 2018. FINDINGS: The bowel gas pattern is normal. No radio-opaque calculi or other significant radiographic abnormality are seen. Distal tip of nasogastric tube seen in expected position of the stomach. IMPRESSION: No evidence of bowel obstruction or ileus. Distal tip of nasogastric tube seen in expected position of the stomach. Electronically Signed   By: Marijo Conception, M.D.   On: 07/15/2018 18:57   Dg Chest Port 1 View  Result Date: 07/15/2018 CLINICAL DATA:  Aspiration risk. EXAM: PORTABLE CHEST 1 VIEW COMPARISON:  Radiograph of same day FINDINGS: Stable cardiomegaly. No pneumothorax is noted. No pleural effusion is noted. Endotracheal and nasogastric tubes are in good position. Right internal jugular catheter is unchanged in position. Right lung is clear. Left basilar airspace opacity is noted which is increased compared to prior exam and consistent with worsening pneumonia or atelectasis. Bony thorax is unremarkable. IMPRESSION: Stable support apparatus. Increased left basilar opacity is noted consistent with worsening pneumonia or  atelectasis. Electronically Signed   By: Marijo Conception, M.D.   On: 07/15/2018 18:56   Labs: BMET Recent Labs  Lab 07/14/18 0436 07/14/18 1352 07/15/18 0414 07/15/18 1522 07/16/18 0526 07/16/18 1517 07/17/18 0527  NA 153* 151* 149* 142 150* 147* 154*  154*   K 3.2* 3.8 4.1 4.2 4.5 4.6 4.2  4.2  CL 124* 120* 122* 115* 123* 121* 129*  129*  CO2 _0 21*  21*  GLUCOSE 159* 173* 162* 159* 142* 230* 184*  181*  BUN 45* 51* 57* 59* 62* 62* 49*  50*  CREATININE 1.58* 1.61* 1.66* 1.56* 1.58* 1.52* 1.43*  1.38*  CALCIUM 11.5* 11.4* 11.4* 10.9* 11.2* 11.1* 11.7*  11.6*  PHOS 2.7 3.6 4.6 4.6 4.4 3.8 2.4*  2.4*   CBC Recent Labs  Lab 07/14/18 0436 07/15/18 0414 07/16/18 0526 07/17/18 0527  WBC 15.1* 12.1* 12.1* 12.2*  NEUTROABS  --  10.8*  --   --   HGB 8.7* 8.1* 7.0* 8.5*  HCT 32.4* 30.1* 25.6* 32.2*  MCV 108.0* 110.3* 109.9* 110.7*  PLT 185 156 184 229    Medications:    . aMILoride  10 mg Oral Daily  . aspirin  81 mg Per Tube Daily  . chlorhexidine gluconate (MEDLINE KIT)  15 mL Mouth Rinse BID  . Chlorhexidine Gluconate Cloth  6 each Topical Q0600  . citalopram  40 mg Per Tube Daily  . clonazepam  1 mg Per Tube BID  . feeding supplement (PRO-STAT SUGAR FREE 64)  60 mL Per Tube QID  . finasteride  5 mg Oral Daily  . folic acid  1 mg Per Tube Daily  . free water  500 mL Per Tube Q4H  . lamoTRIgine  200 mg Per Tube QHS  . mouth rinse  15 mL Mouth Rinse 10 times per day  . methadone  10 mg Oral Q12H  . multivitamin  15 mL Per Tube Daily  . nystatin   Topical BID  . pantoprazole sodium  40 mg Per Tube Daily  . QUEtiapine  100 mg Per Tube BID  . tamsulosin  0.8 mg Oral Daily  . thiamine  100 mg Per Tube Daily    Rory Percy, DO PGY2 07/17/2018, 7:15 AM   I have seen and examined this patient and agree with plan and assessment in the above note with renal recommendations/intervention highlighted.  Unfortunately Mr. Bloor clinical condition has worsened and his father has decided for one way extubation and comfort measures given his immediate respiratory distress.  Will sign off.  Please call with any questions or concerns.  Broadus John A Bronislaus Verdell,MD 07/17/2018 1:12 PM

## 2018-07-17 NOTE — Procedures (Signed)
Extubation Procedure Note  Patient Details:   Name: EULISES KIJOWSKI DOB: [birth year] MRN: [id number]   Airway Documentation:  Airway 7.5 mm (Active)  Secured at (cm) 25 cm 07/17/2018  8:04 AM  Measured From Lips 07/17/2018  8:04 AM  [place] 07/17/2018  8:04 AM  Secured By [employer] Tube Holder 07/17/2018  8:04 AM  Tube Holder Repositioned Yes 07/17/2018  8:04 AM  Cuff Pressure (cm H2O) 26 cm H2O 07/17/2018  8:04 AM  Site Condition Dry 07/17/2018  8:04 AM   Vent end date: 07/13/18 Vent end time: 0900   Evaluation  O2 sats: stable throughout Complications: No apparent complications Patient did tolerate procedure well. Bilateral Breath Sounds: Rhonchi   Yes  Patient extubated to room air per MD order  Lanna Poche 07/17/2018, 11:43 AM

## 2018-07-17 NOTE — Progress Notes (Signed)
ANTICOAGULATION CONSULT NOTE   Pharmacy Consult:  Argatroban Indication: Cool hand / HIT  Patient Measurements: Height: 6\' 2"  (188 cm) Weight: (!) 300 lb 11.3 oz (136.4 kg) IBW/kg (Calculated) : 82.2  Vital Signs: Temp: 100.2 F (37.9 C) (11/11 0400) Temp Source: Axillary (11/11 0400) BP: 143/65 (11/11 0600) Pulse Rate: 99 (11/11 0600)  Labs: Recent Labs    07/15/18 0414  07/16/18 0526 07/16/18 1517 07/17/18 0527  HGB 8.1*  --  7.0*  --  8.5*  HCT 30.1*  --  25.6*  --  32.2*  PLT 156  --  184  --  229  APTT 55*  --  52*  --  44*  CREATININE 1.66*   < > 1.58* 1.52* 1.43*  1.38*   < > = values in this interval not displayed.    Assessment: 59 YOM with +SRA to continue on argatroban for rule out clot.  Patient with dusky/cool hand per MD assessment but no intervention by vascular at this point.   APTT decreased to sub-therapeutic level this AM.  No issue with infusion nor IV site per RN.  No bleeding reported.  Goal of Therapy:  aPTT 50-90 seconds Monitor platelets by anticoagulation protocol: Yes   Plan:  Increase Argatroban to 1.03 mcg/kg/min (IBW) Check aPTT 2 hrs post rate change Daily aPTT and CBC   Eyvonne Burchfield D. Mina Marble, PharmD, BCPS, [place] 07/17/2018, 7:28 AM

## 2018-07-17 NOTE — Progress Notes (Signed)
Daily Progress Note   Patient Name: Frank Cox       Date: 07/17/2018 DOB: [birth year]  Age: 57 y.o. MRN#: [id number] Attending Physician: Juanito Doom, MD Primary Care Physician: Antony Contras, MD Admit Date: 06/20/2018  Reason for Consultation/Follow-up: Establishing goals of care  Subjective: Patient remains on vent Face to face meeting with patient's father. Prolonged life review. Father states that patient would not want to continue to live in a debilitated state. Would not want artificial feeding, hydration, would not want to be dependent for care. In discussion with patient's father yesterday he stated that patient would not want to live in SNF or long term care facility.  Discussed with patient's father measures at this time that are prolonging patient's life including intubation, artificial feeding, aggressive medical care, IV fluids, medications. When patient is off sedation he is encephalopathic and agitated. When off vent this morning respirations decreased to 3-4 per hour.  Patient's father stated he did not wish to continue aggressive medical care including IV fluids, artificial feedings, or other interventions that would prolong patient's life.    Review of Systems  Unable to perform ROS: Intubated    Length of Stay: 24  Current Medications: Scheduled Meds:  . aMILoride  10 mg Oral Daily  . aspirin  81 mg Per Tube Daily  . chlorhexidine gluconate (MEDLINE KIT)  15 mL Mouth Rinse BID  . Chlorhexidine Gluconate Cloth  6 each Topical Q0600  . citalopram  40 mg Per Tube Daily  . clonazepam  1 mg Per Tube BID  . feeding supplement (PRO-STAT SUGAR FREE 64)  60 mL Per Tube QID  . finasteride  5 mg Oral Daily  . folic acid  1 mg Per Tube Daily  . free water  500 mL Per  Tube Q4H  . lamoTRIgine  200 mg Per Tube QHS  . mouth rinse  15 mL Mouth Rinse 10 times per day  . methadone  10 mg Oral Q12H  . multivitamin  15 mL Per Tube Daily  . pantoprazole sodium  40 mg Per Tube Daily  . QUEtiapine  100 mg Per Tube BID  . tamsulosin  0.8 mg Oral Daily  . thiamine  100 mg Per Tube Daily    Continuous Infusions: . sodium chloride 500 mL (07/09/18 1717)  .  dextrose 150 mL/hr at 07/17/18 1100  . feeding supplement (VITAL HIGH PROTEIN) 30 mL/hr at 07/17/18 1000  . fentaNYL infusion INTRAVENOUS Stopped (07/17/18 1003)  . insulin 7 mL/hr at 07/17/18 1100  . sodium chloride      PRN Meds: sodium chloride, acetaminophen, fentaNYL, hydrALAZINE, midazolam, midazolam, sodium chloride  Physical Exam  Constitutional: He appears well-developed and well-nourished.  Cardiovascular:  Diffuse anasarca  Pulmonary/Chest:  ventilated  Neurological:  Does not respond to voice or touch, does not follow commands  Skin:  Left digits ischemic  Nursing note and vitals reviewed.           Vital Signs: BP (!) 95/58   Pulse 76   Temp 100 F (37.8 C) (Axillary)   Resp 20   Ht 6' 2"  (1.88 m)   Wt (!) 136.4 kg   SpO2 97%   BMI 38.61 kg/m  SpO2: SpO2: 97 % O2 Device: O2 Device: Ventilator O2 Flow Rate: O2 Flow Rate (L/min): 6 L/min  Intake/output summary:   Intake/Output Summary (Last 24 hours) at 07/17/2018 1123 Last data filed at 07/17/2018 1100 Gross per 24 hour  Intake 4600.86 ml  Output 7950 ml  Net -3349.14 ml   LBM: Last BM Date: 07/16/18 Baseline Weight: Weight: (!) 139.6 kg Most recent weight: Weight: (!) 136.4 kg       Palliative Assessment/Data: PPS: 10%    Flowsheet Rows     Most Recent Value  Intake Tab  Referral Department  Critical care  Unit at Time of Referral  ICU  Palliative Care Primary Diagnosis  Neurology  Date Notified  07/14/18  Palliative Care Type  New Palliative care  Reason for referral  Clarify Goals of Care  Date of  Admission  06/20/2018  Date first seen by Palliative Care  07/15/18  # of days Palliative referral response time  1 Day(s)  # of days IP prior to Palliative referral  21  Clinical Assessment  Psychosocial & Spiritual Assessment  Palliative Care Outcomes      Patient Active Problem List   Diagnosis Date Noted  . Palliative care by specialist   . Advanced care planning/counseling discussion   . Goals of care, counseling/discussion   . AKI (acute kidney injury) ([place])   . Hypernatremia   . Shock ([place])   . Acute respiratory distress   . Acute respiratory failure ([place])   . Lithium toxicity 06/17/2018  . Benign essential HTN 10/01/2016  . Morbid obesity ([place]) 11/22/2015  . Acute lower GI bleeding 09/26/2014  . Bipolar 1 disorder ([place]) 09/26/2014  . GI bleed 09/26/2014  . Malignant neoplasm of prostate ([place]) 03/19/2014  . Cellulitis of right leg without foot 11/17/2013  . Cellulitis 11/17/2013  . Diabetes mellitus, type II ([place]) 11/17/2013  . OSA (obstructive sleep apnea) 11/17/2013  . Depression   . Lapband APL Jan 2011 12/02/2011    Palliative Care Assessment & Plan   Patient Profile: 57 y.o. male  with past medical history of bipoar disorder, DM2, HTN, OSA admitted on 06/22/2018 with altered mental status and lithium toxicity requiring CRRT and intubation. He was intubated on 10/28 for mental status, extubated on 11/7, but required reintubation on 11/8. Has remained encephalopathic throughout hospitalization. Palliative medicine consulted for [place].   Assessment/Recommendations/Plan   Discussed with CCM Darlina Sicilian, NP- there was hope that if sedation was lifted then patient would be able to support airway after extubation therefore, fentanyl was stopped prior to extubation- will transition patient to comfort measures  per [place] discussion, if patient's respiratory status improves, and he begins to eat and drink on his own, then [place] may change- but at this point patient's father  wishes for full comfort care  NP stayed at bedside as patient was extubated. Patient went into immediate respiratory distress with air hunger and agitation that was refractory to fentanyl and benzodiazipine pushes. Discussed with Dr. Domingo Cocking- will start propofol infusion for sedation and comfort.   Patient much more comfortable with start of propofol  Goals of Care and Additional Recommendations:  Limitations on Scope of Treatment: Full Comfort Care  Code Status:  DNR  Prognosis:   Hours - Days  Discharge Planning:  Anticipated Hospital Death  Care plan was discussed with patient's father, Dr. Domingo Cocking, patient's RN- Elle- and Darlina Sicilian, NP.  Thank you for allowing the Palliative Medicine Team to assist in the care of this patient.   Time In: 1000 Time Out: 1245 Total Time 165 mins Prolonged Time Billed yes      Greater than 50%  of this time was spent counseling and coordinating care related to the above assessment and plan.  Mariana Kaufman, AGNP-C Palliative Medicine   Please contact Palliative Medicine Team phone at [phone] for questions and concerns.

## 2018-07-17 NOTE — Progress Notes (Signed)
Palliative Attending Documentation Reason: Clarification for Propofol Infusion  Received call from RN related to concerns about propofol infusion initiation. Please see palliative care assessment and plan for complete details related to comfort care transition and symptom management. I support and participated in decision making regarding the choice and selection of comfort care medications for this patient.  1. Palliative Care met spoke patients family extensively on 11/9 and there was a strong and clear desire for full "comfort care" and to not unnecessarily prolong the patient's life, especially in an even more debilitated state than his baseline prior to admission.Family met with [facility] provider and pan was for terminal wean, full comfort care with the anticipation of a hospital death on 07-22-2023.  2. Patient was removed abruptly from his baseline fentanyl infusion that has been infusing at doses of 150-273mg/hr since 10/28 and bolus dosing that frequently totaled over 500+ mcg daily, just prior to a "one way extubation" - "Comfort if that fails" plan per CCM.  3. The Palliative NP was at the bedside for the extubation and provided support in active symptom management - the patient had a very difficult wean, and it was clear shortly after the extubation that this was not a one way extubation and that the patient must be restarted on his pain control and sedation. [facility]- but he required very high, repeat doses of Fentanyl and versed without achieving comfort- he was flailing in the bed and having severe refractory distress post-extubation. In order to provide compassionate EOL care we initiated a propofol infusion, an accepted and very helpful third line sedation infusion when there excessive suffering in comfort care patient.  4. I [facility] max[facility] at 80/hr propofol and continued to be agitated, she spoke with CCM NP  who placed him back on a Fentanyl infusion now at 400/hr, I recommended a higher bolus dose of 200-400 mcg of fentanyl based on the basal infusion rate. He is now comfortable. Informed RN if there additional issues I may be contacted directly for additional comfort care orders and infusion titrations.  ELane Hacker DO Palliative Medicine [phone](mobile)

## 2018-07-18 DIAGNOSIS — J9601 Acute respiratory failure with hypoxia: Secondary | ICD-10-CM

## 2018-07-18 MED ORDER — MIDAZOLAM HCL 2 MG/2ML IJ SOLN
2.0000 mg | INTRAMUSCULAR | Status: DC | PRN
  Administered 2018-07-18: 2 mg via INTRAVENOUS
  Filled 2018-07-18: qty 2

## 2018-08-06 NOTE — Progress Notes (Signed)
Palliative: PMT arrived on the unit to find that Frank Cox has passed.  No family at bedside at this time. Support provided to staff as they endorse that Frank Cox had a difficult passing.  We talked about the importance of their work, recognizing the difficult situation.  Staff endorses that patient's father was at peace with comfort measures only, and that Frank Cox is no longer suffering. No charge Quinn Axe, NP Palliative medicine team [phone]

## 2018-08-06 NOTE — Death Summary Note (Signed)
DEATH SUMMARY   Patient Details  Name: Frank Cox MRN: [id number] DOB: Apr 27, 1961  Admission/Discharge Information   Admit Date:  2018-07-07  Date of Death: Date of Death: August 01, 2018  Time of Death: Time of Death: 0922  Length of Stay: 2022-12-13  Referring Physician: Antony Contras, MD   Reason(s) for Hospitalization  Altered mental status.  Diagnoses  Preliminary cause of death:    Acute on chronic lithium toxicity.  Secondary Diagnoses (including complications and co-morbidities):  Principal Problem:   Lithium toxicity Active Problems:   Lapband APL Jan 2011   OSA (obstructive sleep apnea)   Malignant neoplasm of prostate (HCC)   Bipolar 1 disorder (HCC)   Morbid obesity (HCC)   Benign essential HTN   Acute respiratory failure (HCC)   Acute respiratory distress   Shock ([place])   AKI (acute kidney injury) ([place])   Hypernatremia   Palliative care by specialist   Advanced care planning/counseling discussion   Goals of care, counseling/discussion   Terminal care   Brief Hospital Course (including significant findings, care, treatment, and services provided and events leading to death)  Frank Cox is a 57 y.o. year old male who presented with altered mental status for several weeks. He was found to have an elevated lithium level. He was dialyzed but remained encephalopathic. Eventually required intubation.  He was unable to tolerate extubation and remained profoundly encephalopathic. His father did not believe that he would wish a tracheostomy and the possibility of prolonged dependent care and opted for extubation to comfort care.  Pertinent Labs and Studies  Significant Diagnostic Studies Ct Abdomen Pelvis Wo Contrast  Result Date: 07/04/2018 CLINICAL DATA:  Persistent diarrhea and fevers EXAM: CT ABDOMEN AND PELVIS WITHOUT CONTRAST TECHNIQUE: Multidetector CT imaging of the abdomen and pelvis was performed following the standard protocol without IV contrast.  COMPARISON:  Ultrasound kidneys 07-Jul-2018, CT from 01/11/2011 FINDINGS: Lower chest: Mild bibasilar atelectasis is noted. No sizable effusion is seen. Hepatobiliary: Liver is well visualized and within normal limits. The gallbladder demonstrates dependent gallstones without gallbladder wall thickening or pericholecystic fluid. Pancreas: Unremarkable. No pancreatic ductal dilatation or surrounding inflammatory changes. Spleen: Normal in size without focal abnormality. Adrenals/Urinary Tract: Adrenal glands are within normal limits. Kidneys are well visualized bilaterally. No renal calculi or obstructive changes are seen. Some rounded exophytic lesions are noted in the right kidney likely representing cysts but incompletely evaluated on this exam. These were previously shown to represent cysts on prior ultrasound examination from 07/07/2018. Foley catheter decompresses the bladder. Stomach/Bowel: Nasogastric catheter is noted extending into the second portion of the duodenum. Postsurgical changes in the stomach are noted consistent with prior bariatric surgery. The small bowel is decompressed. No inflammatory or obstructive changes are seen. The appendix is within normal limits. The colon is fluid filled without definitive obstructing lesion. No inflammatory changes are noted. Rectal tube is noted in place. Vascular/Lymphatic: Aortic atherosclerosis. No enlarged abdominal or pelvic lymph nodes. Reproductive: Prostate is unremarkable. Other: No abdominal wall hernia or abnormality. No abdominopelvic ascites. Musculoskeletal: Degenerative changes of lumbar spine are seen. IMPRESSION: Fluid-filled colon without obstructive change. Cholelithiasis without complicating factors. Bilateral lower lobe infiltrate/atelectasis. Chronic changes in the kidneys. Electronically Signed   By: Inez Catalina M.D.   On: 07/04/2018 13:14   Dg Abd 1 View  Result Date: 07/15/2018 CLINICAL DATA:  Aspiration risk. EXAM: ABDOMEN - 1 VIEW  COMPARISON:  Radiograph of July 13, 2018. FINDINGS: The bowel gas pattern is normal. No radio-opaque calculi or  other significant radiographic abnormality are seen. Distal tip of nasogastric tube seen in expected position of the stomach. IMPRESSION: No evidence of bowel obstruction or ileus. Distal tip of nasogastric tube seen in expected position of the stomach. Electronically Signed   By: Marijo Conception, M.D.   On: 07/15/2018 18:57   Dg Abd 1 View  Result Date: 07/13/2018 CLINICAL DATA:  Nasogastric tube placement. EXAM: ABDOMEN - 1 VIEW COMPARISON:  Radiograph of July 03, 2018. FINDINGS: The bowel gas pattern is normal. Distal tip of nasogastric tube is seen in expected position of gastroesophageal junction. IMPRESSION: Distal tip of nasogastric tube seen in expected position of gastroesophageal junction; advancement is recommended. Electronically Signed   By: Marijo Conception, M.D.   On: 07/13/2018 10:40   Dg Abd 1 View  Result Date: 06/29/2018 CLINICAL DATA:  57 y/o  M; acute respiratory distress. EXAM: ABDOMEN - 1 VIEW COMPARISON:  07/02/2018 abdominal ultrasound. 08/10/2016 CT abdomen and pelvis. FINDINGS: Mild air-filled distention of large bowel. Multilevel degenerative changes of the spine. Upper abdomen is excluded from the field of view. IMPRESSION: Mild air-filled distention of the large bowel possibly representing dysmotility or constipation. Electronically Signed   By: Kristine Garbe M.D.   On: 06/29/2018 18:11   Ct Head Wo Contrast  Result Date: 07/04/2018 CLINICAL DATA:  57 year old male with encephalopathy. Persistent fever. On ventilator. Subsequent encounter. EXAM: CT HEAD WITHOUT CONTRAST TECHNIQUE: Contiguous axial images were obtained from the base of the skull through the vertex without intravenous contrast. COMPARISON:  06/20/2018 head CT. FINDINGS: Brain: No intracranial hemorrhage or CT evidence of large acute infarct. No intracranial mass lesion noted on this  unenhanced exam. Vascular: No hyperdense vessel.  Mild carotid artery calcifications. Skull: Negative Sinuses/Orbits: Exophthalmos. Slight prominence right superior ophthalmic vein without cause identified. Minimal mucosal thickening right maxillary sinus and left sphenoid sinus. Other: Mastoid air cells and middle ear cavities are clear. IMPRESSION: 1. No acute intracranial abnormality noted however, unenhanced head CT cannot adequately assess for the possibility of intracranial infection (which would require contrast enhanced MR if clinically desired). 2. Exophthalmos. Slight prominence right superior ophthalmic vein of indeterminate etiology or significance. No obvious cavernous sinus abnormality noted as a cause of this finding. 3. Minimal mucosal thickening right maxillary sinus and left sphenoid sinus. Electronically Signed   By: Genia Del M.D.   On: 07/04/2018 12:54   Ct Head Wo Contrast  Result Date: 06/20/2018 CLINICAL DATA:  dizziness for " a few weeks" Per EMS : Pt has had 3 unwitnessed falls in the last 2 weeks. Pt lives with father at home. Pt is oriented to person and place. Pt disoriented to year and situation "Pt confused EXAM: CT HEAD WITHOUT CONTRAST TECHNIQUE: Contiguous axial images were obtained from the base of the skull through the vertex without intravenous contrast. COMPARISON:  None. FINDINGS: Brain: No evidence of acute infarction, hemorrhage, hydrocephalus, extra-axial collection or mass lesion/mass effect. Vascular: No hyperdense vessel or unexpected calcification. Skull: Normal. Negative for fracture or focal lesion. Sinuses/Orbits: No acute finding. Other: None IMPRESSION: Negative Electronically Signed   By: Lucrezia Europe M.D.   On: 06/20/2018 11:37   Mr Brain Wo Contrast  Result Date: 07/07/2018 CLINICAL DATA:  Persistent encephalopathy EXAM: MRI HEAD WITHOUT CONTRAST TECHNIQUE: Multiplanar, multiecho pulse sequences of the brain and surrounding structures were obtained  without intravenous contrast. COMPARISON:  Head CT 07/04/2018 FINDINGS: Brain: No acute infarction, hemorrhage, hydrocephalus, extra-axial collection or mass lesion. Vascular: Major flow voids  are preserved Skull and upper cervical spine: Low marrow signal in the cervical spine and partially in the clivus. No superimposed clival sclerosis by CT to suggest this is metastatic disease in this patient with history of prostate cancer. Sinuses/Orbits: Nasal cavity and nasopharyngeal fluid in the setting of intubation. Small bilateral mastoid fluid levels are likely related to the same. IMPRESSION: 1. Unremarkable MRI of the brain. No explanation for encephalopathy. 2. Low marrow signal, often red marrow reconversion, please correlate with CBC. Electronically Signed   By: Monte Fantasia M.D.   On: 07/07/2018 11:54   US Renal  Result Date: 06/11/2018 CLINICAL DATA:  Acute kidney injury.  Hypertension and diabetes. EXAM: RENAL / URINARY TRACT ULTRASOUND COMPLETE COMPARISON:  CT 08/10/2016 FINDINGS: Right Kidney: Length: 13.4 cm, consistent with diabetic glomerular nephropathy. No hydronephrosis. Two renal cysts in the 2-3 cm size range. Parenchymal echogenicity is slightly increased. Left Kidney: Length: 16 0.0 cm, consistent with diabetic the Candis Schatz for optic the. No hydronephrosis. One cyst measuring up to 2 cm in diameter. Parenchymal echogenicity is slightly increased. Bladder: Foley catheter in the bladder. IMPRESSION: Enlarged echogenic kidneys consistent with diabetic glomerular nephropathy. No obstruction. Bilateral benign appearing renal cysts. Electronically Signed   By: Nelson Chimes M.D.   On: 06/06/2018 17:54   Dg Chest Port 1 View  Result Date: 07/17/2018 CLINICAL DATA:  57 year old male respiratory failure. Subsequent encounter. EXAM: PORTABLE CHEST 1 VIEW COMPARISON:  07/15/2018 chest x-ray. FINDINGS: Left central line tip in the region of the left brachiocephalic vein unchanged from 07/14/2018  although retracted compared to 07/13/2018. Right central line tip mid superior vena cava level. Endotracheal tube tip 5.3 cm above the carina. Nasogastric tube seen to level of distal esophagus. Tip not imaged on present exam. Mild cardiomegaly. Slightly asymmetric pulmonary vascular congestion greatest centrally. Poor inspiration. Elevated hemidiaphragms more notable on the right. Similar appearance of left base consolidation suggestive of atelectasis. Infiltrate secondary consideration. No obvious pneumothorax. IMPRESSION: 1. Pulmonary vascular congestion greatest centrally without significant change. 2. Left base consolidation may represent subsegmental atelectasis. Infiltrate secondary consideration and without change. 3. Mild cardiomegaly. 4. Left central line tip in the region of the left brachiocephalic vein unchanged from 07/14/2018 although retracted compared to 07/13/2018. 5. Right central line tip mid superior vena cava level. Electronically Signed   By: Genia Del M.D.   On: 07/17/2018 07:28   Dg Chest Port 1 View  Result Date: 07/15/2018 CLINICAL DATA:  Aspiration risk. EXAM: PORTABLE CHEST 1 VIEW COMPARISON:  Radiograph of same day FINDINGS: Stable cardiomegaly. No pneumothorax is noted. No pleural effusion is noted. Endotracheal and nasogastric tubes are in good position. Right internal jugular catheter is unchanged in position. Right lung is clear. Left basilar airspace opacity is noted which is increased compared to prior exam and consistent with worsening pneumonia or atelectasis. Bony thorax is unremarkable. IMPRESSION: Stable support apparatus. Increased left basilar opacity is noted consistent with worsening pneumonia or atelectasis. Electronically Signed   By: Marijo Conception, M.D.   On: 07/15/2018 18:56   Dg Chest Port 1 View  Result Date: 07/15/2018 CLINICAL DATA:  Respiratory failure. EXAM: PORTABLE CHEST 1 VIEW COMPARISON:  07/14/2018 and 07/13/2018 FINDINGS: Right IJ sheath and  left IJ central venous catheter unchanged. Enteric tube courses through the region of the stomach and off the inferior portion of the film as tip is not visualized. Endotracheal tube has tip 4.5 cm above the carina. Lungs are adequately inflated with mild linear density  over the right midlung and left base likely atelectasis. Minimal prominence of the perihilar markings left greater than right likely due to mild vascular congestion. Stable cardiomegaly. Remainder of the exam is unchanged. IMPRESSION: Stable cardiomegaly with suggestion of mild vascular congestion and minimal bilateral linear atelectasis. Tubes and lines as described. Electronically Signed   By: Marin Olp M.D.   On: 07/15/2018 07:52   Dg Chest Port 1 View  Result Date: 07/14/2018 CLINICAL DATA:  Intubation EXAM: PORTABLE CHEST 1 VIEW COMPARISON:  07/13/2018 FINDINGS: Endotracheal tube tip measures 4.9 cm above the carina. Enteric tube tip is off the field of view but below the left hemidiaphragm. Right central venous catheter with tip over the cavoatrial junction region. Left central venous catheter with tip over the expected location of the brachiocephalic artery. This is been pulled back since the previous study. Shallow inspiration with atelectasis in the lung bases. Probable small left pleural effusion. No airspace disease or consolidation. No pneumothorax. IMPRESSION: Appliances appear in satisfactory location. Shallow inspiration with atelectasis in the lung bases. Probable small left pleural effusion. Electronically Signed   By: Lucienne Capers M.D.   On: 07/14/2018 05:58   Dg Chest Port 1 View  Result Date: 07/13/2018 CLINICAL DATA:  Respiratory failure and history of fever EXAM: PORTABLE CHEST 1 VIEW COMPARISON:  Yesterday FINDINGS: Endotracheal tube tip at the clavicular heads. Bilateral IJ line with tips at the SVC. An orogastric tube at least reaches the stomach. Low lung volumes with base and perihilar opacity greater on the  left. No Kerley lines, effusion, or pneumothorax. Cardiomegaly. IMPRESSION: 1. Stable hardware positioning. 2. Stable atelectasis or pneumonia on both sides Electronically Signed   By: Monte Fantasia M.D.   On: 07/13/2018 09:35   Dg Chest Port 1 View  Result Date: 07/12/2018 CLINICAL DATA:  Respiratory failure.  Ventilator patient. EXAM: PORTABLE CHEST 1 VIEW COMPARISON:  07/11/2018 and 07/10/2018. FINDINGS: 0548 hours. Endotracheal tube, orogastric tube and bilateral central lines appear unchanged in position. There is mildly increased subsegmental atelectasis at both lung bases. There is stable mild vascular congestion, and no pleural effusion or pneumothorax. The heart size and mediastinal contours are stable. IMPRESSION: Mildly increased atelectasis at both lung bases. Stable support system. Electronically Signed   By: Richardean Sale M.D.   On: 07/12/2018 09:44   Dg Chest Port 1 View  Result Date: 07/11/2018 CLINICAL DATA:  Acute respiratory failure. EXAM: PORTABLE CHEST 1 VIEW COMPARISON:  Radiograph July 10, 2018. FINDINGS: Stable cardiomegaly. Endotracheal and nasogastric tubes are unchanged in position. Stable bilateral internal jugular catheters are noted. No pneumothorax is noted. Mild bilateral midlung subsegmental atelectasis is noted. Mild right basilar subsegmental atelectasis is noted. Bony thorax is unremarkable. IMPRESSION: Bilateral midlung subsegmental atelectasis. Mild right basilar subsegmental atelectasis. Stable support apparatus. Electronically Signed   By: Marijo Conception, M.D.   On: 07/11/2018 07:54   Dg Chest Port 1 View  Result Date: 07/10/2018 CLINICAL DATA:  Endotracheal tube placement. EXAM: PORTABLE CHEST 1 VIEW COMPARISON:  07/07/2018 FINDINGS: Endotracheal tube has tip 6.3 cm above the carina. Nasogastric tube courses into the region of the stomach and off the inferior portion of the film as tip is not visualized. Left IJ central venous catheter is unchanged with  tip obliquely oriented over the SVC. Right IJ central venous catheter has tip over the SVC. Lungs are adequately inflated with minimal linear density left base likely atelectasis. Mild prominence of the perihilar markings unchanged likely vascular congestion. No definite  effusion. Cardiomediastinal silhouette and remainder of the exam is unchanged. IMPRESSION: Minimal linear atelectasis left base. Suggestion of mild stable vascular congestion. Tubes and lines as described. Electronically Signed   By: Marin Olp M.D.   On: 07/10/2018 07:47   Dg Chest Port 1 View  Result Date: 07/07/2018 CLINICAL DATA:  Dialysis catheter placement EXAM: PORTABLE CHEST 1 VIEW COMPARISON:  07/05/2018 FINDINGS: Right jugular dual lumen catheter tip in the lower SVC. No pneumothorax. Left jugular catheter tip in the proximal SVC. Endotracheal tube in good position. NG tube in place with the tip not visualized Negative for edema.  Bibasilar atelectasis/infiltrate unchanged. IMPRESSION: Satisfactory dialysis catheter placement Bibasilar atelectasis/infiltrate unchanged Electronically Signed   By: Franchot Gallo M.D.   On: 07/07/2018 16:10   Dg Chest Port 1 View  Result Date: 07/05/2018 CLINICAL DATA:  Ventilator dependent EXAM: PORTABLE CHEST 1 VIEW COMPARISON:  07/04/2018 FINDINGS: Cardiac shadow is enlarged. Endotracheal tube, nasogastric catheter and left jugular central line are again seen and stable. Bibasilar atelectatic changes are again noted and relatively stable. No pneumothorax is seen. No sizable effusion is noted. IMPRESSION: No change from the previous day. Electronically Signed   By: Inez Catalina M.D.   On: 07/05/2018 10:29   Portable Chest Xray  Result Date: 07/04/2018 CLINICAL DATA:  Respiratory failure, intubated patient, morbid obesity. EXAM: PORTABLE CHEST 1 VIEW COMPARISON:  Portable chest x-ray of July 04, 2018 at 12 midnight FINDINGS: The lungs are mildly hypoinflated. The interstitial markings  remain increased. There is fluid in the minor fissure. The cardiac silhouette is enlarged and the pulmonary vascularity engorged. There is no large pleural effusion. The endotracheal tube tip projects 4.4 cm above the carina. The esophagogastric tube tip and proximal port project below the inferior margin of the image. The left internal jugular venous catheter tip projects over the proximal SVC. IMPRESSION: CHF with mild interstitial edema. Bibasilar atelectasis. Mild hypoinflation. These findings are stable. The support tubes are in reasonable position. Electronically Signed   By: David  Martinique M.D.   On: 07/04/2018 09:20   Dg Chest Port 1 View  Result Date: 07/04/2018 CLINICAL DATA:  57 year old male with central line placement. EXAM: PORTABLE CHEST 1 VIEW COMPARISON:  Chest radiograph dated 07/03/2018 FINDINGS: Interval placement of a left IJ central line with tip over central SVC. No pneumothorax. Endotracheal tube above the carina in similar position and enteric tube extending below the diaphragm with tip beyond the inferior margin of the image. Cardiomegaly with mild vascular congestion. Shallow inspiration with bibasilar atelectasis/scarring. No pleural effusion. No acute osseous pathology. IMPRESSION: Interval placement of a left IJ central line with tip over central SVC. No pneumothorax. Electronically Signed   By: Anner Crete M.D.   On: 07/04/2018 01:35   Dg Chest Port 1 View  Result Date: 07/03/2018 CLINICAL DATA:  57 year old male with enteric tube placement. EXAM: PORTABLE ABDOMEN - 1 VIEW; PORTABLE CHEST - 1 VIEW COMPARISON:  Chest radiograph dated 07/02/2018 FINDINGS: Endotracheal tube with tip approximately 5 cm above the carina. An enteric tube extends below the diaphragm making a single turn in the left upper abdomen with tip extending inferiorly beyond the inferior margin of the image, likely in the distal stomach. There is mild cardiomegaly with mild vascular congestion. Right mid  lung field linear atelectasis/scarring. No focal consolidation, pleural effusion, or pneumothorax. No acute osseous pathology. IMPRESSION: 1. Endotracheal tube above the carina. Enteric tube the left hemiabdomen likely in the distal stomach. The tip of the enteric  tube is beyond the inferior margin of the image. 2. Cardiomegaly with mild vascular congestion. Electronically Signed   By: Anner Crete M.D.   On: 07/03/2018 04:42   Dg Chest Port 1 View  Result Date: 07/02/2018 CLINICAL DATA:  Acute respiratory failure with hypoxia EXAM: PORTABLE CHEST 1 VIEW COMPARISON:  07/01/2018 FINDINGS: Low lung volumes. Lower lobe atelectasis. No frank interstitial edema. Cardiomegaly.  No pleural effusion or pneumothorax. IMPRESSION: Low lung volumes with lower lobe atelectasis. Electronically Signed   By: Julian Hy M.D.   On: 07/02/2018 09:22   Dg Chest Port 1 View  Result Date: 07/01/2018 CLINICAL DATA:  Hypoxia. EXAM: PORTABLE CHEST 1 VIEW COMPARISON:  06/29/2018 FINDINGS: The heart is enlarged but stable. Mild vascular congestion but no overt pulmonary edema. Persistent low lung volumes with areas of atelectasis and scarring. No pleural effusions. IMPRESSION: Persistent cardiac enlargement. Low lung volumes with vascular crowding and moderate areas of atelectasis along with underlying scarring. Electronically Signed   By: Marijo Sanes M.D.   On: 07/01/2018 16:04   Dg Chest Port 1 View  Result Date: 06/29/2018 CLINICAL DATA:  Respiratory failure EXAM: PORTABLE CHEST 1 VIEW COMPARISON:  06/28/2018 FINDINGS: Cardiac shadow is enlarged but stable. Increased vascular markings are again noted and stable. Areas of scarring are again seen bilaterally stable from previous exams. No new focal abnormality is noted. IMPRESSION: Stable appearance of the chest.  No acute abnormality noted. Electronically Signed   By: Inez Catalina M.D.   On: 06/29/2018 13:01   Dg Chest Port 1 View  Result Date:  06/28/2018 CLINICAL DATA:  Short of breath. EXAM: PORTABLE CHEST 1 VIEW COMPARISON:  06/25/2018 and older exams. FINDINGS: Cardiac silhouette is mildly enlarged. No mediastinal or hilar masses. Prominent bronchovascular markings, stable. There linear areas of opacity in the right mid lung and both bases consistent with scarring and/or subsegmental atelectasis. No convincing pneumonia. No pulmonary edema. No pleural effusion or pneumothorax. IMPRESSION: 1. No acute cardiopulmonary disease. Electronically Signed   By: Lajean Manes M.D.   On: 06/28/2018 12:47   Dg Chest Port 1 View  Result Date: 06/25/2018 CLINICAL DATA:  Shortness of breath.  Fever this morning. EXAM: PORTABLE CHEST 1 VIEW COMPARISON:  Single-view of the chest 06/19/2018. PA and lateral chest 05/10/2017. FINDINGS: Right subclavian approach central venous catheter has its tip in the right brachiocephalic vein, unchanged. There is cardiomegaly and vascular congestion. No consolidative process, pneumothorax or effusion. IMPRESSION: No acute disease. Cardiomegaly and vascular congestion. Right subclavian central venous catheter tip projects in the right brachiocephalic vein. Electronically Signed   By: Inge Rise M.D.   On: 06/25/2018 12:58   Dg Chest Port 1 View  Result Date: 06/14/2018 CLINICAL DATA:  Central line placement EXAM: PORTABLE CHEST 1 VIEW COMPARISON:  05/10/2017 FINDINGS: Right subclavian central line with tip directed inferiorly, seen at the level of the upper SVC. Low volume chest with vascular congestion. There is asymmetric airspace disease in the right mid lung. Cardiopericardial enlargement. Vascular pedicle widening, accentuated by rotation IMPRESSION: 1. Right-sided central line with tip near the upper SVC. No evident pneumothorax. 2. Cardiomegaly and vascular congestion. 3. Asymmetric alveolar edema versus pneumonia on the right. Electronically Signed   By: Monte Fantasia M.D.   On: 06/20/2018 12:52   Dg Abd  Portable 1v  Result Date: 07/03/2018 CLINICAL DATA:  57 year old male with enteric tube placement. EXAM: PORTABLE ABDOMEN - 1 VIEW; PORTABLE CHEST - 1 VIEW COMPARISON:  Chest radiograph dated 07/02/2018  FINDINGS: Endotracheal tube with tip approximately 5 cm above the carina. An enteric tube extends below the diaphragm making a single turn in the left upper abdomen with tip extending inferiorly beyond the inferior margin of the image, likely in the distal stomach. There is mild cardiomegaly with mild vascular congestion. Right mid lung field linear atelectasis/scarring. No focal consolidation, pleural effusion, or pneumothorax. No acute osseous pathology. IMPRESSION: 1. Endotracheal tube above the carina. Enteric tube the left hemiabdomen likely in the distal stomach. The tip of the enteric tube is beyond the inferior margin of the image. 2. Cardiomegaly with mild vascular congestion. Electronically Signed   By: Anner Crete M.D.   On: 07/03/2018 04:42   Vas [place] Upper Extremity Arterial Duplex  Result Date: 07/06/2018 UPPER EXTREMITY DUPLEX STUDY Indications: Possible lt radial artery thrombosis, recent arterial catheter              placement.  Performing Technologist: Abram Sander  Examination Guidelines: A complete evaluation includes B-mode imaging, spectral Doppler, color Doppler, and power Doppler as needed of all accessible portions of each vessel. Bilateral testing is considered an integral part of a complete examination. Limited examinations for reoccurring indications may be performed as noted.  Left Doppler Findings: +--------+----------+---------+------+--------+ Site    PSV (cm/s)Waveform PlaqueComments +--------+----------+---------+------+--------+ [id number]       triphasic               +--------+----------+---------+------+--------+ Radial  99        triphasic               +--------+----------+---------+------+--------+ Ulnar   128       triphasic                +--------+----------+---------+------+--------+  Summary:  Left: No obstruction visualized in the left upper extremity. *See table(s) above for measurements and observations. Electronically signed by Monica Martinez MD on 07/06/2018 at 6:15:22 PM.    Final     Microbiology No results found for this or any previous visit (from the past 240 hour(s)).  Lab Basic Metabolic Panel: Recent Labs  Lab 07/13/18 0454  07/14/18 0436  07/15/18 0414 07/15/18 1522 07/16/18 0526 07/16/18 1517 07/17/18 0527  NA 146*  147*   < > 153*   < > 149* 142 150* 147* 154*  154*  K 3.7  3.6   < > 3.2*   < > 4.1 4.2 4.5 4.6 4.2  4.2  CL 115*  116*   < > 124*   < > 122* 115* 123* 121* 129*  129*  CO2 25  25   < > 23   < > 23 22 22 25  21*  21*  GLUCOSE 156*  157*   < > 159*   < > 162* 159* 142* 230* 184*  181*  BUN 48*  49*   < > 45*   < > 57* 59* 62* 62* 49*  50*  CREATININE 1.61*  1.61*   < > 1.58*   < > 1.66* 1.56* 1.58* 1.52* 1.43*  1.38*  CALCIUM 10.7*  10.7*   < > 11.5*   < > 11.4* 10.9* 11.2* 11.1* 11.7*  11.6*  MG 2.9*  --  3.1*  --  2.8*  --  2.9*  --  2.8*  PHOS 4.8*  4.8*   < > 2.7   < > 4.6 4.6 4.4 3.8 2.4*  2.4*   < > = values in this interval not displayed.   Liver Function Tests:  Recent Labs  Lab 07/15/18 0414 07/15/18 1522 07/16/18 0526 07/16/18 1517 07/17/18 0527  ALBUMIN 2.0* 2.0* 2.0* 2.0* 2.2*   No results for input(s): LIPASE, AMYLASE in the last 168 hours. No results for input(s): AMMONIA in the last 168 hours. CBC: Recent Labs  Lab 07/13/18 0454 07/14/18 0436 07/15/18 0414 07/16/18 0526 07/17/18 0527  WBC 12.4* 15.1* 12.1* 12.1* 12.2*  NEUTROABS  --   --  10.8*  --   --   HGB 8.3* 8.7* 8.1* 7.0* 8.5*  HCT 30.2* 32.4* 30.1* 25.6* 32.2*  MCV 108.6* 108.0* 110.3* 109.9* 110.7*  PLT 135* 185 156 184 229   Cardiac Enzymes: No results for input(s): CKTOTAL, CKMB, CKMBINDEX, TROPONINI in the last 168 hours. Sepsis Labs: Recent Labs  Lab 07/14/18 0436  07/15/18 0414 07/16/18 0526 07/17/18 0527  WBC 15.1* 12.1* 12.1* 12.2*    Procedures/Operations  Hemodialysis Mechanical ventilation.   Tristyn Pharris 2018-08-09, 3:26 PM

## 2018-08-06 NOTE — Progress Notes (Signed)
NAME:  Frank Cox, MRN:  [id number], DOB:  [birth year], LOS: 78 ADMISSION DATE:  06/12/2018, CONSULTATION DATE:  06/22/2018 REFERRING MD:  Stark Jock, ED [facility], CHIEF COMPLAINT:  Alerted mental status.  Brief History   57 year old male with hx bipolar disorder, DM, HTN, OSA on cpap, prostate ca initially admitted 10/18 with acute on chronic lithium toxicity requiring urgent hemodialysis.  Course has been c/b delirium, ongoing encephalopathy, AKI, acute respiratory failure with failed extubation x 2.     Past Medical History  Bipolar affective disorder, T2DM on OHA, HTN, Prostate cancer, OSA on CPAP.  Significant Hospital Events   10/18 Admit with acute renal failure (cr 1.61) Foley catheter > 2 L immediately.  Underwent emergent dialysis for lithium toxicity 10/19 Lithium normalized.   10/20 Tx to SDU 10/24 Tx back to ICU due to agitation 10/26 PCCM called back for AMS, intermittent agitation 10/28 Intubated  11/4 CVVHD held 07/11/2018 increase FiO2 needs treated with Lasix twice during the night 07/13/2018 extubated 07/14/2018 reintubated 11/11 one way extubation and full comfort care  Consults: date of consult/date signed off & final recs:  Nephrology 10/18 - recommending HD Poison Control - recommending hydration, following serial Li levels. Psych 10/19 - do not restart lithium, needs psych consult once encephalopathy clears, recommending inpatient psych admission once medically cleared Urology 10/19 - input regarding possible obstructive uropathy, they recommend keeping foley in and follow up as outpatient 10/29 ID consult for fever 10/31 vascular surgery consult> R hand dusky with HIT  Procedures (surgical and bedside):  10/29 [facility] CVC  11/1 [facility] HD Cath, started CRRT   Significant Diagnostic Tests:  CT head 10/15 > negative. Renal US 10/18 > diabetic glomerular nephropathy.  No obstruction.  Bilateral benign appearing renal cyts.  CT Head 10/29 >> Neg for acute process, enlarge  orbital vein? CT Abd Pelvis 10/29 >> Negative for infectious source, fluid in colon   EEG 10/29 >>> slowing, no seizure   MRI Brain 11/1 >>> unremarkable   Micro Data:  Urine 10/18 > negative Blood 10/20 > negative Blood 10/23 > negative  Urine 10/23 > negative Penile culture 10/22 >> staph lugdunensis (S-, entercoccus (vanco sens) UCx 10/28 >> NGTD  Blood 10/28 >> Coag neg staph 1/4   Trach Asp 10/28 >> NGTD    Antimicrobials:  Zosyn 10/20 > 10/21 Ceftriaxone 10/21 > 10/23 Unasyn 10/26 >>> off Oral Vanco 10/28, c diff neg, stopped   Cefepime 10/28 >> 10/31 Vanco 10/28 >> 10/31 Metronidazole 10/28, stopped Fluconazole 10/29 >> off  Subjective:  One way extubation yesterday afternoon. Significant distress following this. Required high dose fentanyl infusion and boluses and addition of propofol infusion after agreement by palliative care.  This morning has [employer] respirations.  Appears somewhat uncomfortable despite 80 propofol and 400 of fentanyl.  Will add 2-4mg  versed PRN comfort.   Objective   Blood pressure (!) 95/58, pulse 88, temperature 100 F (37.8 C), temperature source Axillary, resp. rate (!) 39, height 6\' 2"  (1.88 m), weight (!) 136.4 kg, SpO2 (!) 44 %.    FiO2 (%):  [21 %] 21 %   Intake/Output Summary (Last 24 hours) at Aug 11, 2018 0804 Last data filed at 2018-08-11 0800 Gross per 24 hour  Intake 2142.25 ml  Output 1275 ml  Net 867.25 ml   Filed Weights   07/14/18 0500 07/15/18 0500 07/16/18 0500  Weight: (!) 138 kg (!) 138 kg (!) 136.4 kg   Examination: General: Adult male, appears somewhat uncomfortable  with cheyne st[place] respirations HEENT: AC / AT, no JVD Neuro: Unresponsive CV: Heart sounds are regular PULM: Has cheyne st[place] respirations this AM  GI: soft, non-tender, bsx4 active  Extremities: warm and dry, BLE venous stawsis, 1-2+ BLE edema, left forefinger ischemic changes   Assessment & Plan:    Acute Hypoxemic and Hypercarbic  Respiratory Failure, principally due to altered mental status and poor airway protection, also some component of interstitial infiltrates. Failed extubation x 2.  Now s/p one way extubation 11/11. Acute toxic, metabolic encephalopathy secondary to above. Agitated Delirium.   Acute on Chronic Lithium Toxicity with Acute Toxic Encephalopathy - s/p CRRT. Suspected nephrogenic DI, hypernatremia. Acute on Chronic Renal Failure, Stage 2 CKD  Plan: The family has decided to offer full comfort care for Mr. Welch. They are aware that the patient may be transferred to the palliative care floor for continued comfort care needs. Continue fentanyl gtt and propofol gtt. Add PRN versed for additional comfort given cheyne stoke respirations. Continue Robinul.    Disposition / Summary of Today's Plan 2018/07/20   Full comfort care    Diet: None Pain/Anxiety/Delirium protocol (if indicated): Yes  VAP protocol (if indicated): N/A. DVT prophylaxis: N/A. GI prophylaxis: N/A. Hyperglycemia protocol: N/A. Mobility: Bedrest.  Code Status: DNR Family Communication: 07/12/2018 father was called per physician and fully updated.  Family updated 11/10 during transition to full comfort care.   Montey Hora, [facility] Pager: [phone]  or [phone] 2018/07/20, 8:11 AM

## 2018-08-06 NOTE — Death Summary Note (Signed)
Pt with no respirations or pulse, verified by 2nd RN. Time of death 85. Montey Hora, PA notified. Called Calla Kicks, father, to inform of son's passing.

## 2018-08-06 DEATH — deceased

## 2018-08-28 ENCOUNTER — Ambulatory Visit: Payer: 59 | Admitting: Neurology

## 2018-08-28 ENCOUNTER — Encounter

## 2019-07-09 IMAGING — DX DG CHEST 1V PORT
1 series · 1 of 1 positions shown · non-contrast
Comparison: 07/05/2018

CLINICAL DATA: Dialysis catheter placement

EXAM:
PORTABLE CHEST 1 VIEW

[chest]
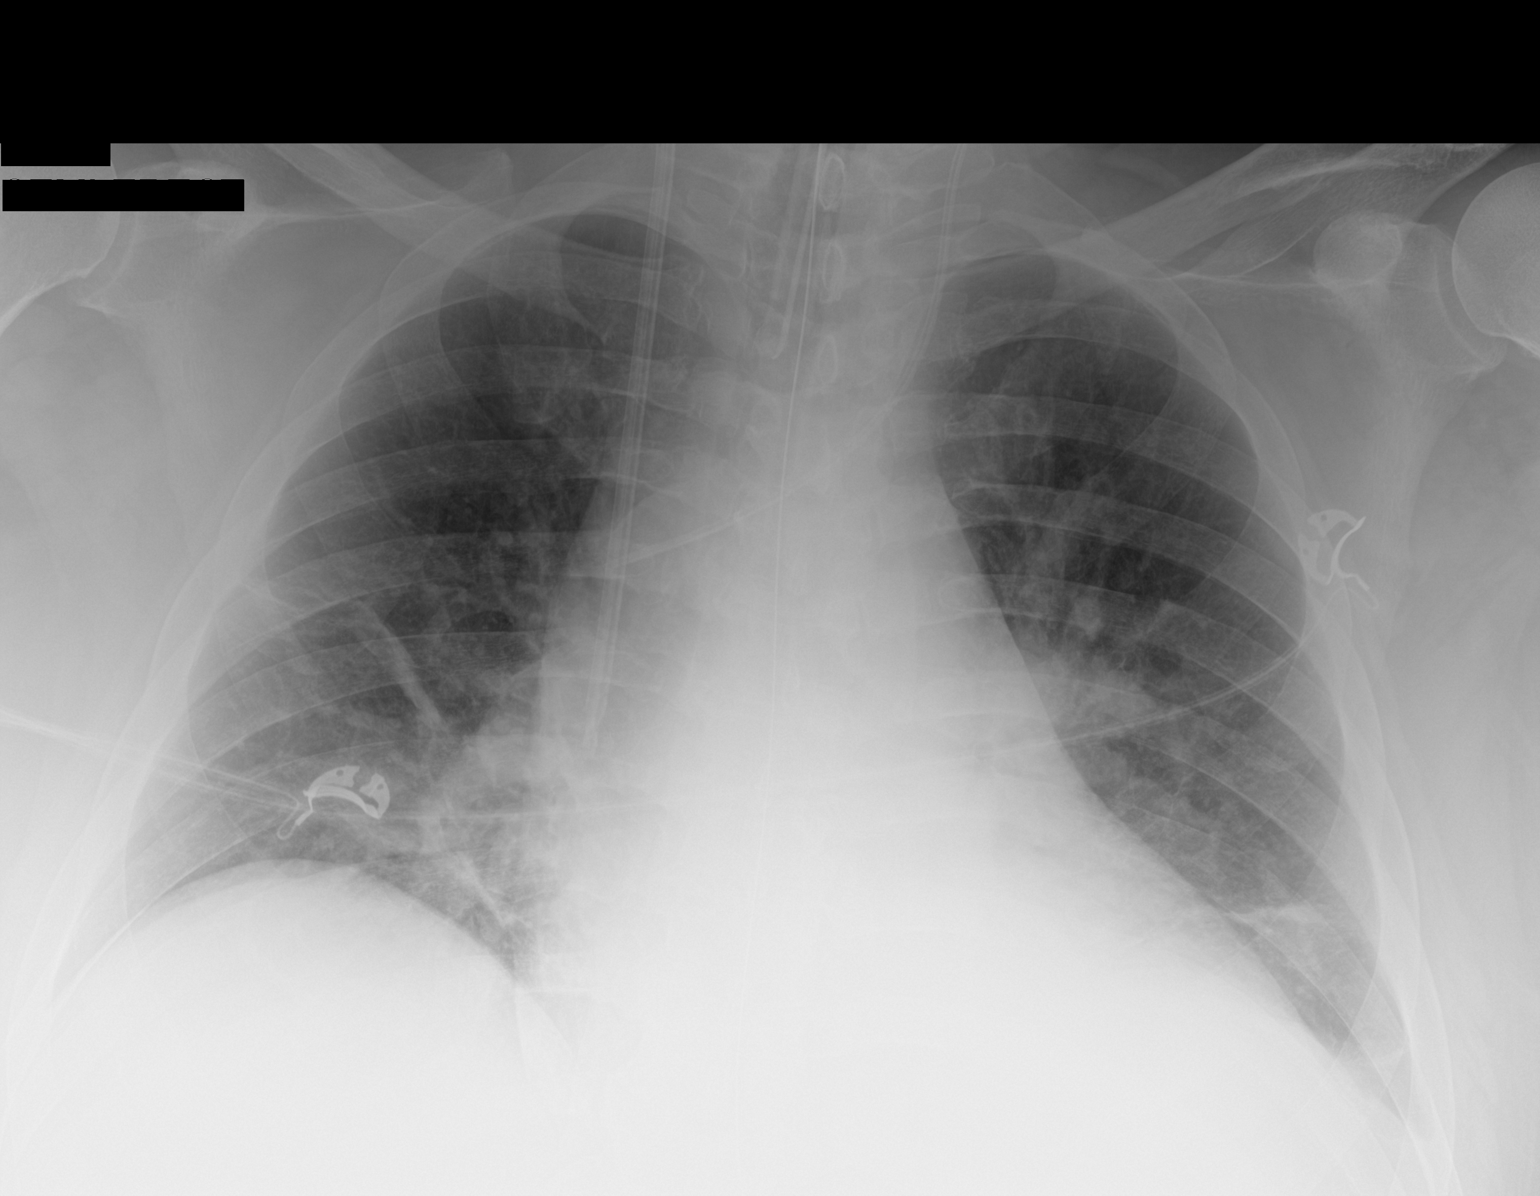

[1 of 1 positions shown; findings below may reference images not displayed]

FINDINGS: Right jugular dual lumen catheter tip in the lower SVC. No
pneumothorax. Left jugular catheter tip in the proximal SVC.
Endotracheal tube in good position. NG tube in place with the tip
not visualized

Negative for edema.  Bibasilar atelectasis/infiltrate unchanged.
IMPRESSION: Satisfactory dialysis catheter placement

Bibasilar atelectasis/infiltrate unchanged

## 2019-07-14 IMAGING — DX DG CHEST 1V PORT
1 series · 1 of 1 positions shown · non-contrast
Comparison: 07/11/2018 and 07/10/2018.

CLINICAL DATA: Respiratory failure.  Ventilator patient.

EXAM:
PORTABLE CHEST 1 VIEW

[chest ap]
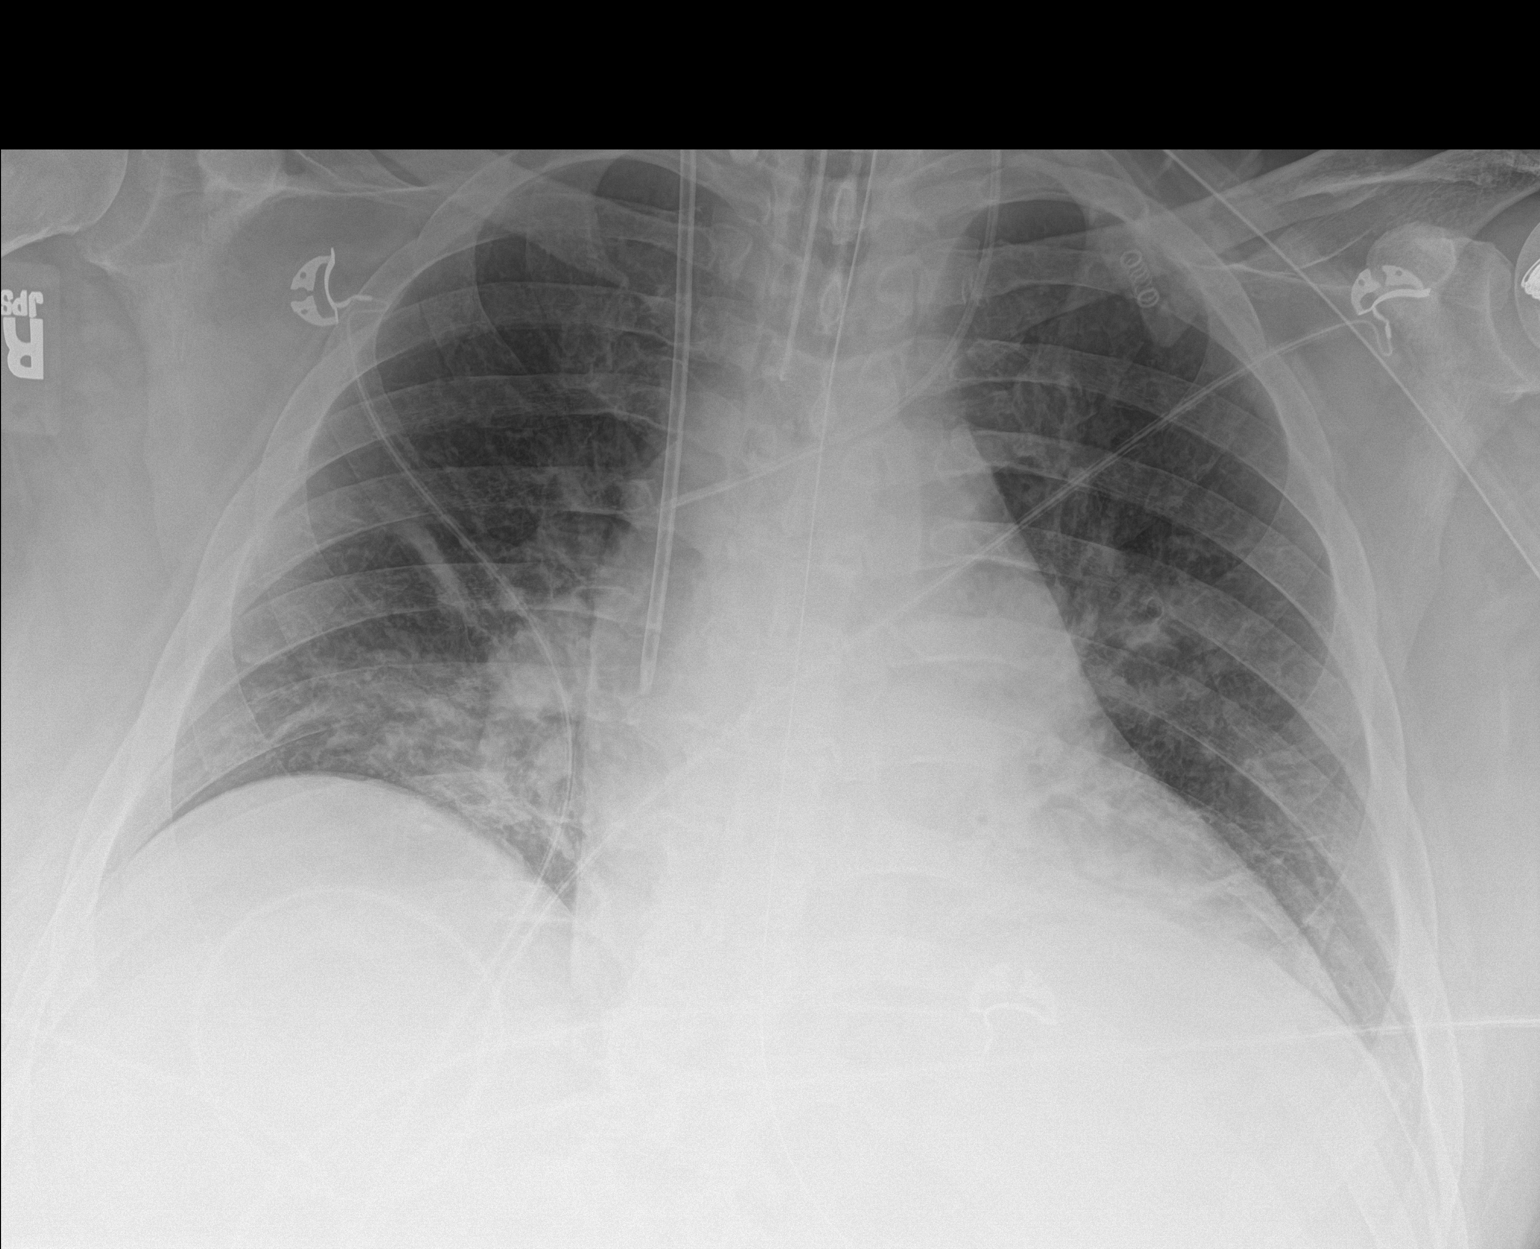

[1 of 1 positions shown; findings below may reference images not displayed]

FINDINGS: 5695 hours. Endotracheal tube, orogastric tube and bilateral central
lines appear unchanged in position. There is mildly increased
subsegmental atelectasis at both lung bases. There is stable mild
vascular congestion, and no pleural effusion or pneumothorax. The
heart size and mediastinal contours are stable.
IMPRESSION: Mildly increased atelectasis at both lung bases. Stable support
system.

## 2019-07-15 IMAGING — DX DG ABDOMEN 1V
1 series · 1 of 1 positions shown · non-contrast
Comparison: Radiograph July 03, 2018.

CLINICAL DATA: Nasogastric tube placement.

EXAM:
ABDOMEN - 1 VIEW

[abdomen]
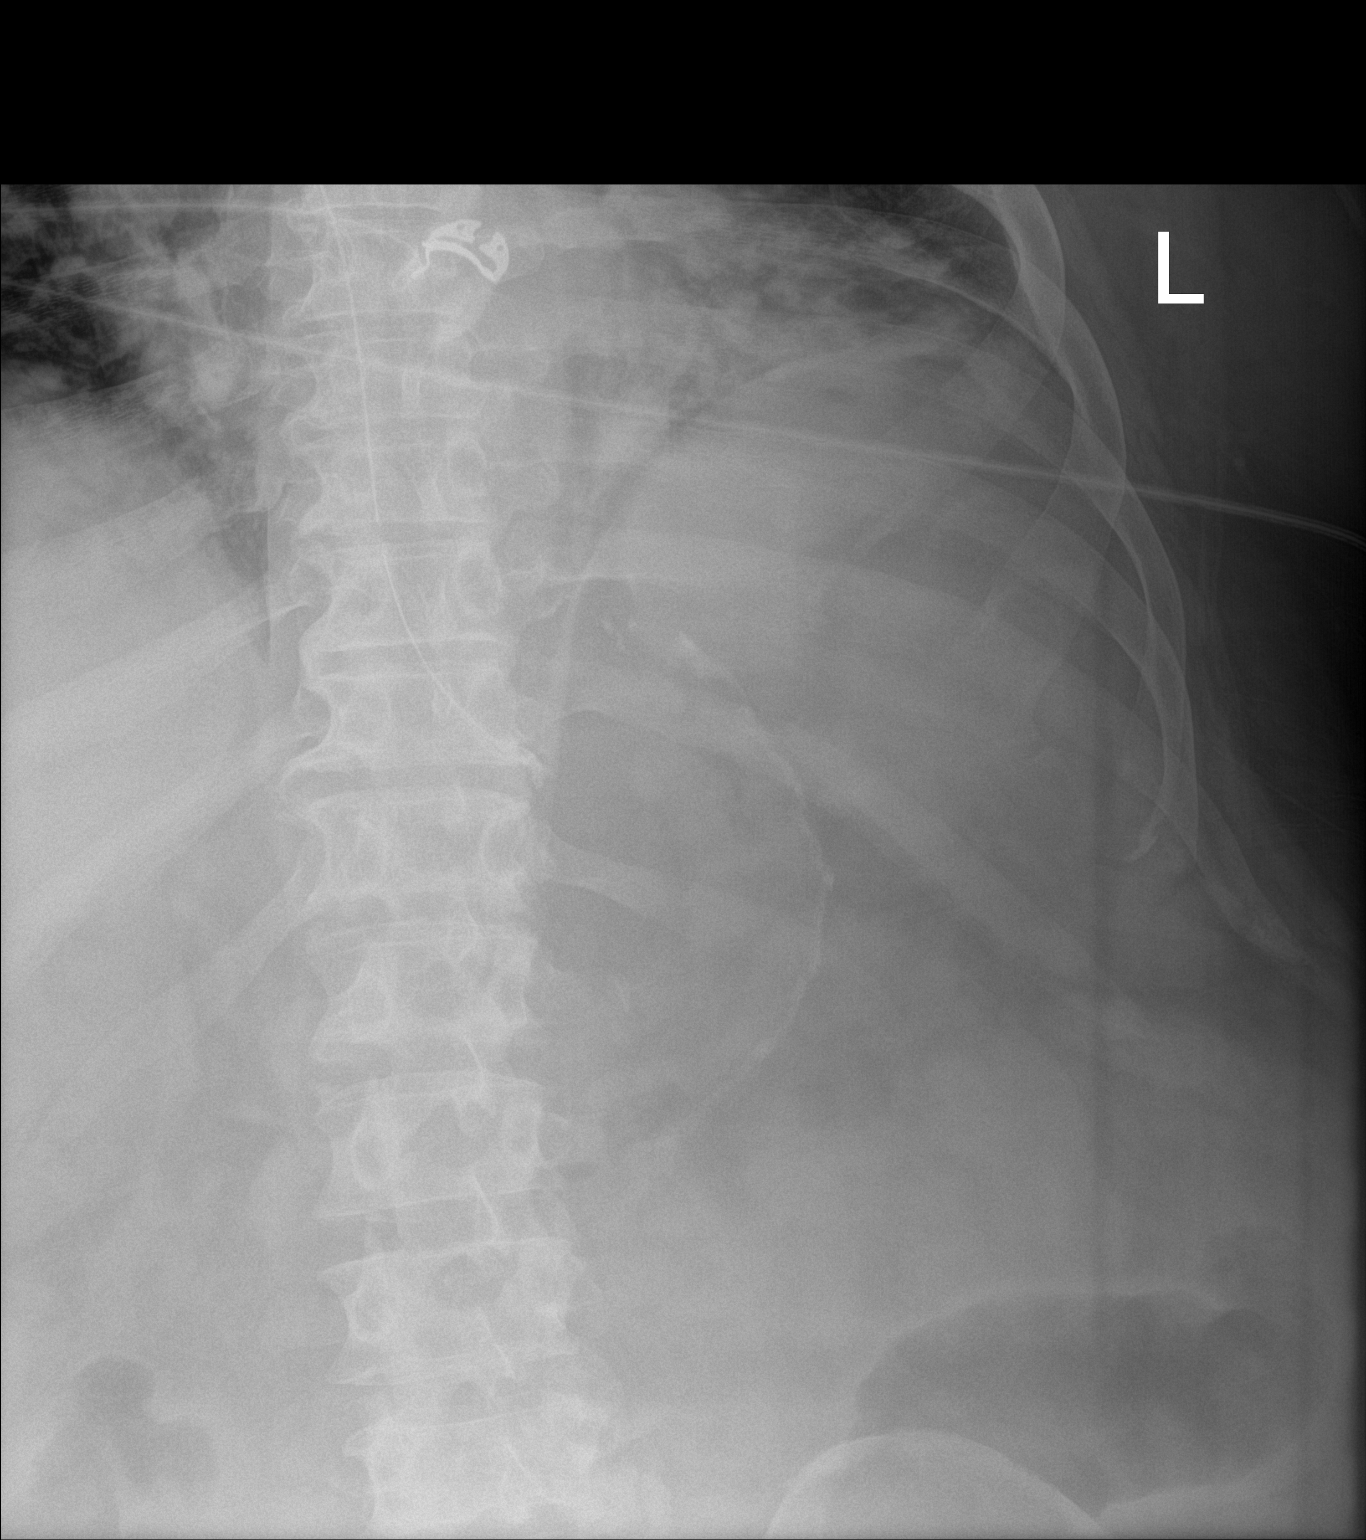

[1 of 1 positions shown; findings below may reference images not displayed]

FINDINGS: The bowel gas pattern is normal. Distal tip of nasogastric tube is
seen in expected position of gastroesophageal junction..
IMPRESSION: Distal tip of nasogastric tube seen in expected position of
gastroesophageal junction; advancement is recommended.

## 2019-07-17 IMAGING — DX DG CHEST 1V PORT
1 series · 1 of 1 positions shown · non-contrast
Comparison: Radiograph of same day

CLINICAL DATA: Aspiration risk.

EXAM:
PORTABLE CHEST 1 VIEW

[chest ap]
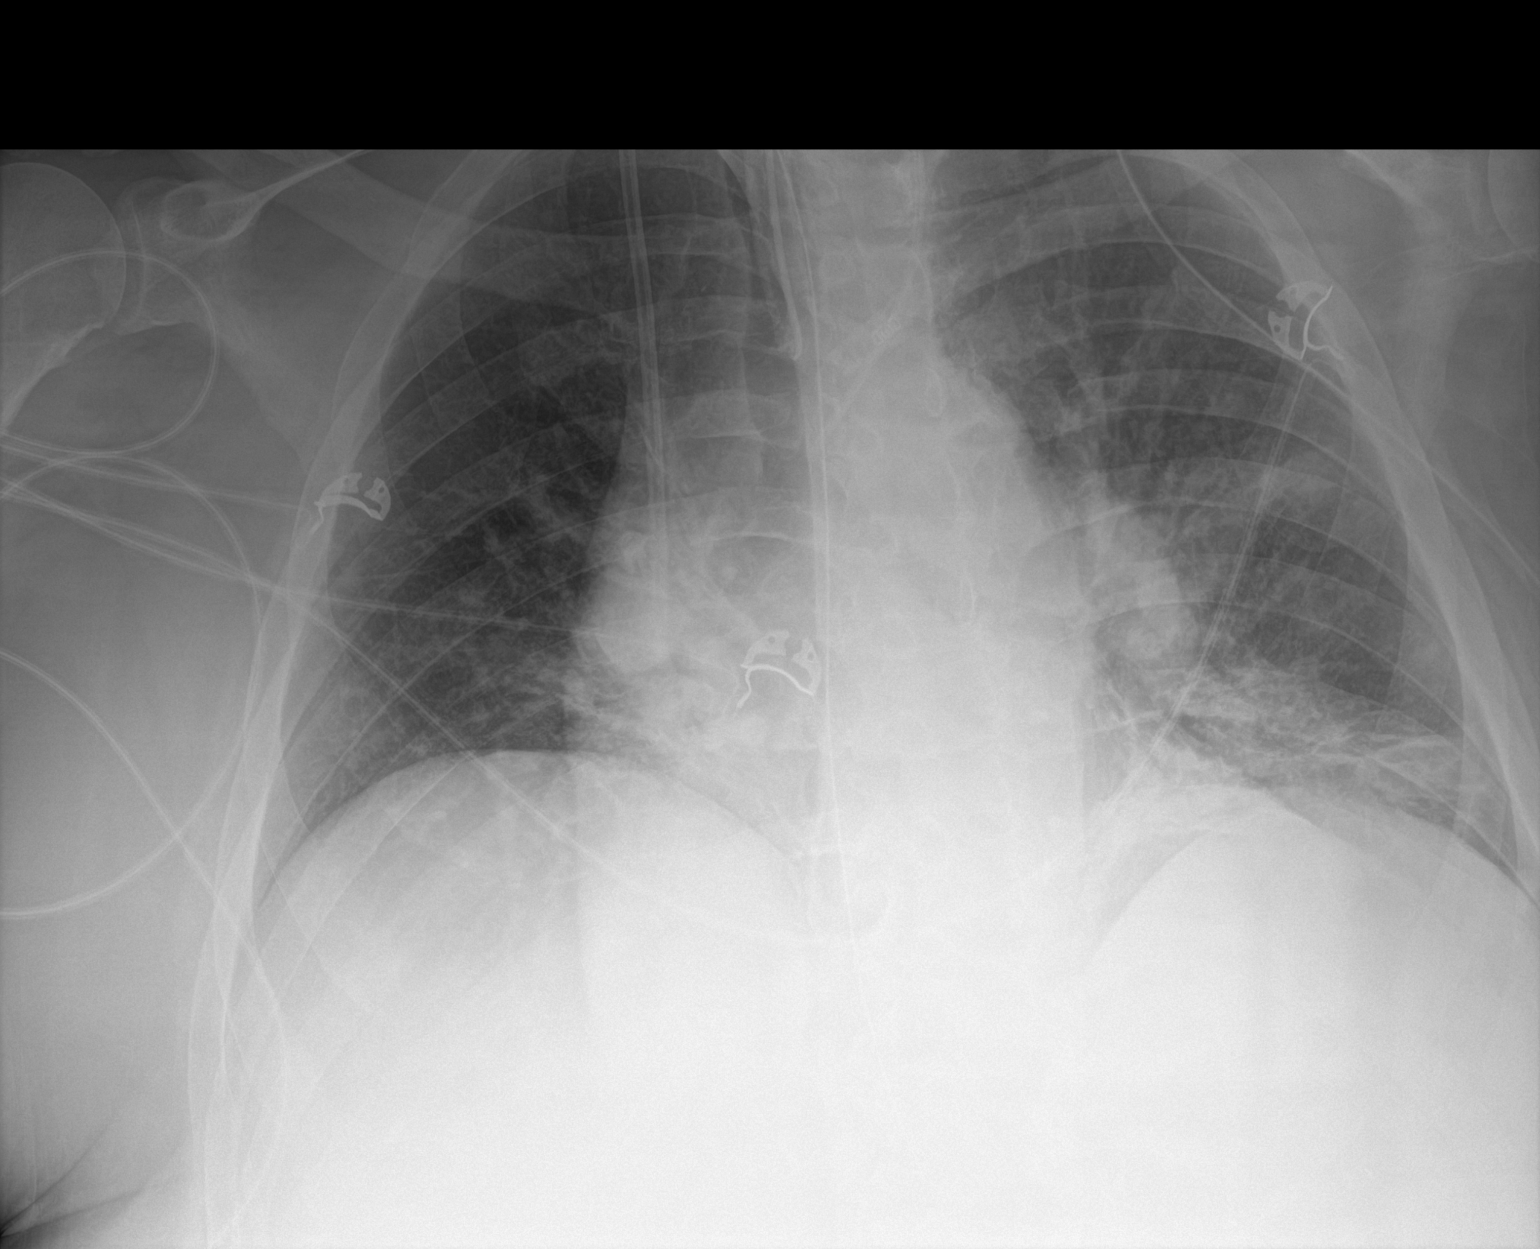

[1 of 1 positions shown; findings below may reference images not displayed]

FINDINGS: Stable cardiomegaly. No pneumothorax is noted. No pleural effusion
is noted. Endotracheal and nasogastric tubes are in good position.
Right internal jugular catheter is unchanged in position. Right lung
is clear. Left basilar airspace opacity is noted which is increased
compared to prior exam and consistent with worsening pneumonia or
atelectasis. Bony thorax is unremarkable.
IMPRESSION: Stable support apparatus. Increased left basilar opacity is noted
consistent with worsening pneumonia or atelectasis.

## 2019-07-19 IMAGING — DX DG CHEST 1V PORT
1 series · 1 of 1 positions shown · non-contrast
Comparison: 07/15/2018 chest x-ray.

CLINICAL DATA: 57-year-old male respiratory failure. Subsequent
encounter.

EXAM:
PORTABLE CHEST 1 VIEW

[chest ap]
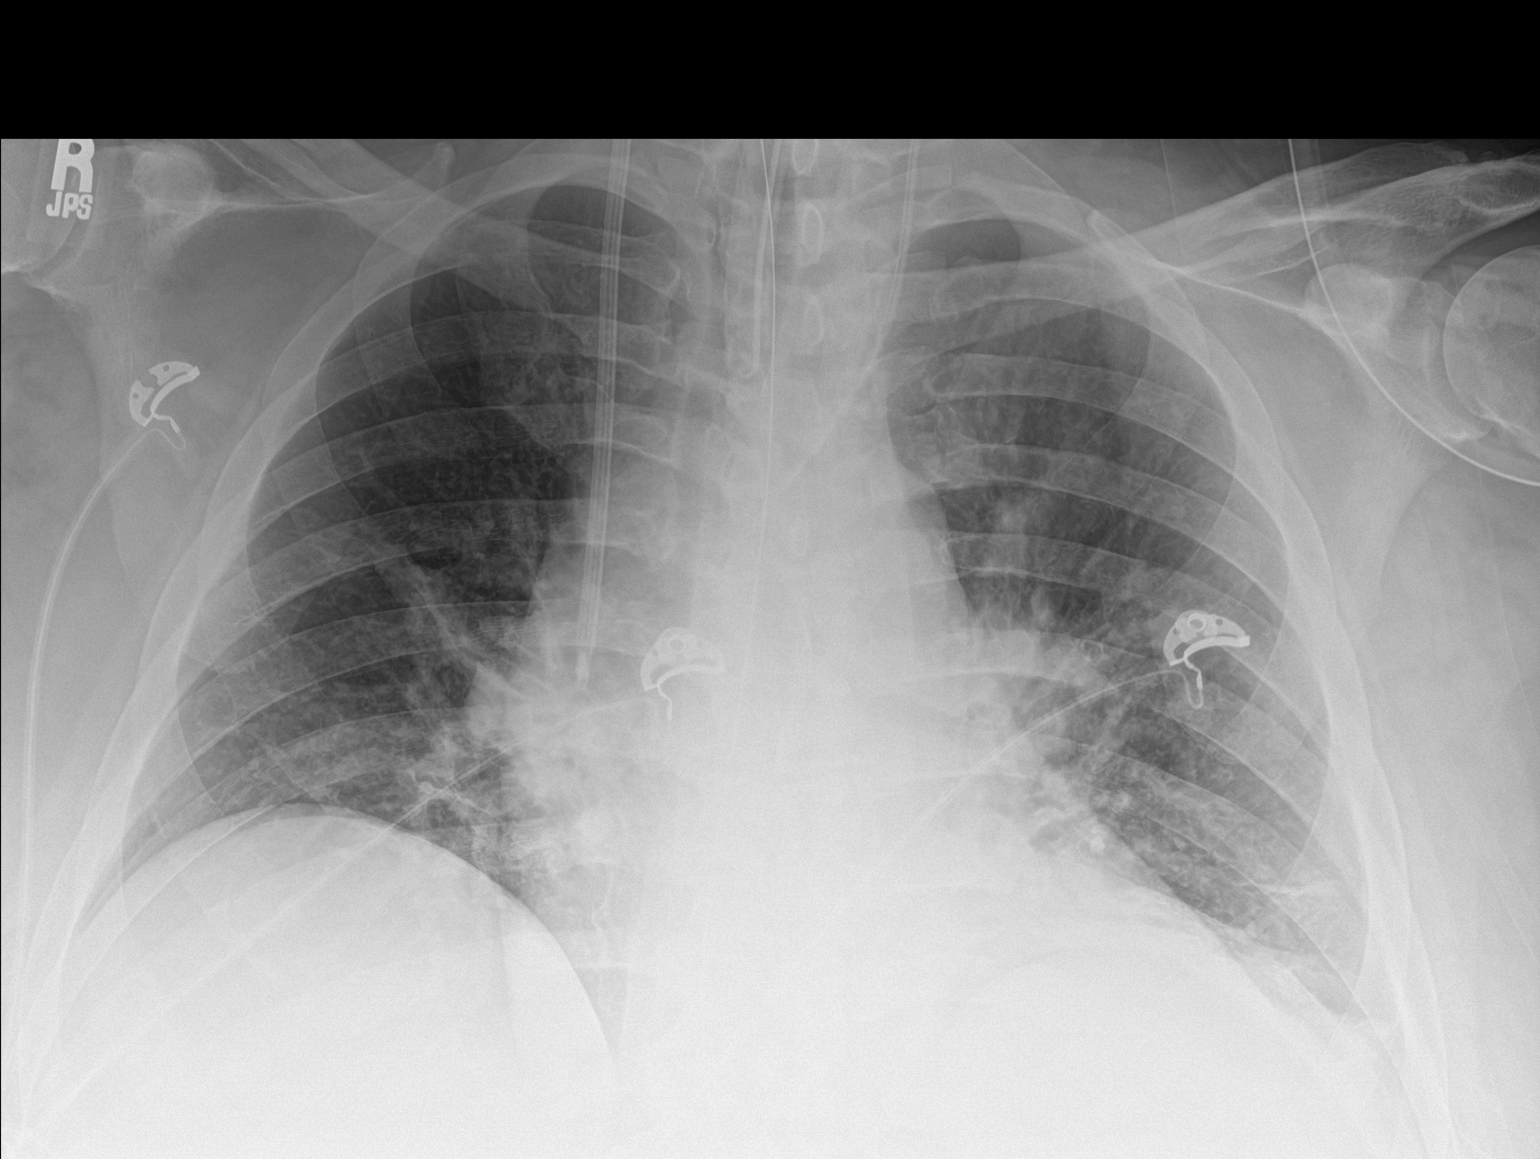

[1 of 1 positions shown; findings below may reference images not displayed]

FINDINGS: Left central line tip in the region of the left brachiocephalic vein
unchanged from 07/14/2018 although retracted compared to 07/13/2018.

Right central line tip mid superior vena cava level. Endotracheal
tube tip 5.3 cm above the carina. Nasogastric tube seen to level of
distal esophagus. Tip not imaged on present exam.

Mild cardiomegaly. Slightly asymmetric pulmonary vascular congestion
greatest centrally.

Poor inspiration. Elevated hemidiaphragms more notable on the right.
Similar appearance of left base consolidation suggestive of
atelectasis. Infiltrate secondary consideration. No obvious
pneumothorax.
IMPRESSION: 1. Pulmonary vascular congestion greatest centrally without
significant change..
2. Left base consolidation may represent subsegmental atelectasis.
Infiltrate secondary consideration and without change.
3. Mild cardiomegaly.
4. Left central line tip in the region of the left brachiocephalic
vein unchanged from 07/14/2018 although retracted compared to
07/13/2018.
5. Right central line tip mid superior vena cava level.
